# Patient Record
Sex: Female | Born: 1955
Health system: Southern US, Community
[De-identification: ages and names within clinical notes are randomized; demographics above are authoritative.]

## PROBLEM LIST (undated history)

## (undated) DIAGNOSIS — Z87891 Personal history of nicotine dependence: Secondary | ICD-10-CM

## (undated) DIAGNOSIS — J189 Pneumonia, unspecified organism: Secondary | ICD-10-CM

## (undated) DIAGNOSIS — I7121 Aneurysm of the ascending aorta, without rupture: Secondary | ICD-10-CM

## (undated) DIAGNOSIS — M419 Scoliosis, unspecified: Secondary | ICD-10-CM

## (undated) DIAGNOSIS — F329 Major depressive disorder, single episode, unspecified: Secondary | ICD-10-CM

## (undated) DIAGNOSIS — R011 Cardiac murmur, unspecified: Secondary | ICD-10-CM

## (undated) DIAGNOSIS — A0472 Enterocolitis due to Clostridium difficile, not specified as recurrent: Secondary | ICD-10-CM

## (undated) DIAGNOSIS — M539 Dorsopathy, unspecified: Secondary | ICD-10-CM

## (undated) DIAGNOSIS — I712 Thoracic aortic aneurysm, without rupture: Secondary | ICD-10-CM

## (undated) DIAGNOSIS — I10 Essential (primary) hypertension: Secondary | ICD-10-CM

## (undated) DIAGNOSIS — I350 Nonrheumatic aortic (valve) stenosis: Secondary | ICD-10-CM

## (undated) DIAGNOSIS — F32A Depression, unspecified: Secondary | ICD-10-CM

## (undated) DIAGNOSIS — K219 Gastro-esophageal reflux disease without esophagitis: Secondary | ICD-10-CM

## (undated) DIAGNOSIS — Q231 Congenital insufficiency of aortic valve: Secondary | ICD-10-CM

## (undated) DIAGNOSIS — Z9889 Other specified postprocedural states: Secondary | ICD-10-CM

## (undated) DIAGNOSIS — Q2381 Bicuspid aortic valve: Secondary | ICD-10-CM

## (undated) DIAGNOSIS — E876 Hypokalemia: Secondary | ICD-10-CM

## (undated) DIAGNOSIS — Z87898 Personal history of other specified conditions: Secondary | ICD-10-CM

## (undated) DIAGNOSIS — I480 Paroxysmal atrial fibrillation: Secondary | ICD-10-CM

## (undated) DIAGNOSIS — J90 Pleural effusion, not elsewhere classified: Secondary | ICD-10-CM

## (undated) DIAGNOSIS — K5792 Diverticulitis of intestine, part unspecified, without perforation or abscess without bleeding: Secondary | ICD-10-CM

## (undated) DIAGNOSIS — F419 Anxiety disorder, unspecified: Secondary | ICD-10-CM

## (undated) HISTORY — DX: Depression, unspecified: F32.A

## (undated) HISTORY — DX: Anxiety disorder, unspecified: F41.9

## (undated) HISTORY — DX: Gastro-esophageal reflux disease without esophagitis: K21.9

## (undated) HISTORY — DX: Congenital insufficiency of aortic valve: Q23.1

## (undated) HISTORY — DX: Nonrheumatic aortic (valve) stenosis: I35.0

## (undated) HISTORY — PX: BREAST BIOPSY: SHX20

## (undated) HISTORY — DX: Bicuspid aortic valve: Q23.81

## (undated) HISTORY — PX: BARTHOLIN GLAND CYST EXCISION: SHX565

## (undated) HISTORY — DX: Hypokalemia: E87.6

## (undated) HISTORY — DX: Personal history of nicotine dependence: Z87.891

## (undated) HISTORY — DX: Cardiac murmur, unspecified: R01.1

## (undated) HISTORY — DX: Thoracic aortic aneurysm, without rupture: I71.2

## (undated) HISTORY — DX: Other specified postprocedural states: Z98.890

## (undated) HISTORY — DX: Personal history of other specified conditions: Z87.898

## (undated) HISTORY — DX: Major depressive disorder, single episode, unspecified: F32.9

## (undated) HISTORY — DX: Diverticulitis of intestine, part unspecified, without perforation or abscess without bleeding: K57.92

## (undated) HISTORY — DX: Enterocolitis due to Clostridium difficile, not specified as recurrent: A04.72

## (undated) HISTORY — DX: Aneurysm of the ascending aorta, without rupture: I71.21

## (undated) HISTORY — DX: Essential (primary) hypertension: I10

## (undated) HISTORY — DX: Pleural effusion, not elsewhere classified: J90

## (undated) HISTORY — PX: SALPINGECTOMY: SHX328

## (undated) HISTORY — DX: Paroxysmal atrial fibrillation: I48.0

---

## 2007-12-16 ENCOUNTER — Emergency Department: Payer: Self-pay | Admitting: Emergency Medicine

## 2011-12-19 HISTORY — PX: COLONOSCOPY: SHX174

## 2012-10-11 ENCOUNTER — Ambulatory Visit: Payer: Self-pay | Admitting: Unknown Physician Specialty

## 2012-10-14 LAB — PATHOLOGY REPORT

## 2013-05-14 ENCOUNTER — Ambulatory Visit: Payer: Self-pay | Admitting: Family Medicine

## 2013-12-18 DIAGNOSIS — Z87898 Personal history of other specified conditions: Secondary | ICD-10-CM

## 2013-12-18 HISTORY — DX: Personal history of other specified conditions: Z87.898

## 2013-12-19 ENCOUNTER — Ambulatory Visit (INDEPENDENT_AMBULATORY_CARE_PROVIDER_SITE_OTHER): Payer: BC Managed Care – PPO | Admitting: Cardiovascular Disease

## 2013-12-19 ENCOUNTER — Encounter (INDEPENDENT_AMBULATORY_CARE_PROVIDER_SITE_OTHER): Payer: Self-pay

## 2013-12-19 ENCOUNTER — Encounter: Payer: Self-pay | Admitting: Cardiovascular Disease

## 2013-12-19 VITALS — BP 133/92 | HR 85 | Ht 65.0 in | Wt 128.5 lb

## 2013-12-19 DIAGNOSIS — K219 Gastro-esophageal reflux disease without esophagitis: Secondary | ICD-10-CM

## 2013-12-19 DIAGNOSIS — I10 Essential (primary) hypertension: Secondary | ICD-10-CM | POA: Insufficient documentation

## 2013-12-19 DIAGNOSIS — F411 Generalized anxiety disorder: Secondary | ICD-10-CM

## 2013-12-19 DIAGNOSIS — F419 Anxiety disorder, unspecified: Secondary | ICD-10-CM

## 2013-12-19 DIAGNOSIS — F32A Depression, unspecified: Secondary | ICD-10-CM | POA: Insufficient documentation

## 2013-12-19 DIAGNOSIS — R011 Cardiac murmur, unspecified: Secondary | ICD-10-CM

## 2013-12-19 DIAGNOSIS — Q231 Congenital insufficiency of aortic valve: Secondary | ICD-10-CM

## 2013-12-19 DIAGNOSIS — F329 Major depressive disorder, single episode, unspecified: Secondary | ICD-10-CM | POA: Insufficient documentation

## 2013-12-19 NOTE — Assessment & Plan Note (Signed)
She takes Klonopin when necessary

## 2013-12-19 NOTE — Patient Instructions (Addendum)
You are doing well. Consider starting aspirin 81 mg coated daily  We will schedule a echocardiogram for bicuspid aortic valve   Please call us if you have new issues that need to be addressed before your next appt.  Your physician wants you to follow-up in: 12 months.  You will receive a reminder letter in the mail two months in advance. If you don't receive a letter, please call our office to schedule the follow-up appointment.

## 2013-12-19 NOTE — Assessment & Plan Note (Signed)
Blood pressure is well controlled on today's visit. No changes made to the medications. 

## 2013-12-19 NOTE — Progress Notes (Signed)
   Patient ID: Deborah Koch, female    DOB: 08-05-1956, 58 y.o.   MRN: [id number]  HPI Comments: Deborah Koch is a very pleasant 58 year old woman with history of hypertension, GERD, previously diagnosed bicuspid aortic valve followed by cardiology in [facility] presents to establish care.   In general she reports that she is active, no complaints of shortness of breath, chest pain, lightheadedness dizziness, near syncope or syncope. She recently remarried, has 2 grown children. Now lives in [place]. She is unaware of the parameters of her valve on recent echocardiogram which was done several years ago. She knows she has a murmur and has had so for 20-30 years.   She reports that she used to smoke but stopped 10 years ago. Smoked since he was younger. She reports her cholesterol is very good though she does not know any specifics. No diabetes. Occasional anxiety and takes Klonopin when necessary  In general blood pressure has been well-controlled on her current medication regimen.  EKG shows normal sinus rhythm with rate 85 beats per minute, no significant ST or T wave changes   Outpatient Encounter Prescriptions as of 12/19/2013  Medication Sig  . clonazePAM (KLONOPIN) 0.5 MG tablet Take 0.5 mg by mouth as needed for anxiety.  [name] lisinopril-hydrochlorothiazide (PRINZIDE,ZESTORETIC) 20-25 MG per tablet Take 1 tablet by mouth daily.  . metoprolol succinate (TOPROL-XL) 25 MG 24 hr tablet Take 12.5 mg by mouth daily.  . nortriptyline (PAMELOR) 25 MG capsule Take 25 mg by mouth at bedtime.  [name] omeprazole (PRILOSEC) 20 MG capsule Take 20 mg by mouth daily.  [name] oxyCODONE-acetaminophen (PERCOCET/ROXICET) 5-325 MG per tablet Take by mouth as needed for severe pain.     Review of Systems  Constitutional: Negative.   HENT: Negative.   Eyes: Negative.   Respiratory: Negative.   Cardiovascular: Negative.   Gastrointestinal: Negative.   Endocrine: Negative.   Musculoskeletal:  Negative.   Skin: Negative.   Allergic/Immunologic: Negative.   Neurological: Negative.   Hematological: Negative.   Psychiatric/Behavioral: Negative.   All other systems reviewed and are negative.    BP 133/92  Pulse 85  Ht 5\' 5"  (1.651 m)  Wt 128 lb 8 oz (58.287 kg)  BMI 21.38 kg/m2  Physical Exam  Nursing note and vitals reviewed. Constitutional: She is oriented to person, place, and time. She appears well-developed and well-nourished.  HENT:  Head: Normocephalic.  Nose: Nose normal.  Mouth/Throat: Oropharynx is clear and moist.  Eyes: Conjunctivae are normal. Pupils are equal, round, and reactive to light.  Neck: Normal range of motion. Neck supple. No JVD present.  Cardiovascular: Normal rate, regular rhythm, S1 normal, S2 normal, normal heart sounds and intact distal pulses.  Exam reveals no gallop and no friction rub.   No murmur heard. Pulmonary/Chest: Effort normal and breath sounds normal. No respiratory distress. She has no wheezes. She has no rales. She exhibits no tenderness.  Abdominal: Soft. Bowel sounds are normal. She exhibits no distension. There is no tenderness.  Musculoskeletal: Normal range of motion. She exhibits no edema and no tenderness.  Lymphadenopathy:    She has no cervical adenopathy.  Neurological: She is alert and oriented to person, place, and time. Coordination normal.  Skin: Skin is warm and dry. No rash noted. No erythema.  Psychiatric: She has a normal mood and affect. Her behavior is normal. Judgment and thought content normal.    Assessment and Plan

## 2013-12-19 NOTE — Assessment & Plan Note (Addendum)
Long history of murmur from underlying bicuspid aortic valve per the patient. Records have been requested. Echo pending.

## 2013-12-19 NOTE — Assessment & Plan Note (Addendum)
We spent some time today talking about the pathology of her bicuspid aortic valve. We have requested records from her prior cardiologist and these will be reviewed when available. We will schedule a echocardiogram as it has been several years since her prior ultrasound. We did discuss various options for treatment if she develops severe aortic valve regurgitation or stenosis. Also talked about rare possible complications including ascending aorta aneurysm. We will call her with the results and will likely schedule a followup visit once a year. We did discuss symptoms to watch for including shortness of breath with exertion, chest pain with exertion, near syncope.

## 2013-12-19 NOTE — Assessment & Plan Note (Signed)
She is taking omeprazole daily with no significant GERD symptoms

## 2013-12-30 ENCOUNTER — Ambulatory Visit: Payer: Self-pay | Admitting: Family Medicine

## 2014-01-01 ENCOUNTER — Other Ambulatory Visit: Payer: BC Managed Care – PPO

## 2014-01-06 ENCOUNTER — Other Ambulatory Visit (INDEPENDENT_AMBULATORY_CARE_PROVIDER_SITE_OTHER): Payer: BC Managed Care – PPO

## 2014-01-06 ENCOUNTER — Other Ambulatory Visit: Payer: Self-pay

## 2014-01-06 DIAGNOSIS — R011 Cardiac murmur, unspecified: Secondary | ICD-10-CM

## 2014-01-06 DIAGNOSIS — Q231 Congenital insufficiency of aortic valve: Secondary | ICD-10-CM

## 2014-01-06 DIAGNOSIS — I359 Nonrheumatic aortic valve disorder, unspecified: Secondary | ICD-10-CM

## 2014-06-26 ENCOUNTER — Ambulatory Visit: Payer: Self-pay | Admitting: Family Medicine

## 2014-07-16 ENCOUNTER — Ambulatory Visit: Payer: Self-pay | Admitting: Unknown Physician Specialty

## 2014-07-18 ENCOUNTER — Inpatient Hospital Stay: Payer: Self-pay | Admitting: Internal Medicine

## 2014-07-18 LAB — URINALYSIS, COMPLETE
Bacteria: NONE SEEN
Bilirubin,UR: NEGATIVE
Blood: NEGATIVE
Glucose,UR: NEGATIVE mg/dL (ref 0–75)
Ketone: NEGATIVE
Leukocyte Esterase: NEGATIVE
Nitrite: NEGATIVE
Ph: 6 (ref 4.5–8.0)
Protein: NEGATIVE
RBC,UR: <1 /HPF (ref 0–5)
Specific Gravity: 1.002 (ref 1.003–1.030)
Squamous Epithelial: 3
WBC UR: <1 /HPF (ref 0–5)

## 2014-07-18 LAB — CBC WITH DIFFERENTIAL/PLATELET
Basophil #: 0.1 10*3/uL (ref 0.0–0.1)
Basophil %: 0.6 %
Eosinophil #: 0.4 10*3/uL (ref 0.0–0.7)
Eosinophil %: 2.8 %
HCT: 37.6 % (ref 35.0–47.0)
HGB: 12.7 g/dL (ref 12.0–16.0)
Lymphocyte #: 1.6 10*3/uL (ref 1.0–3.6)
Lymphocyte %: 13.3 %
MCH: 31.9 pg (ref 26.0–34.0)
MCHC: 33.8 g/dL (ref 32.0–36.0)
MCV: 94 fL (ref 80–100)
Monocyte #: 0.9 x10 3/mm (ref 0.2–0.9)
Monocyte %: 7.2 %
Neutrophil #: 9.4 10*3/uL — ABNORMAL HIGH (ref 1.4–6.5)
Neutrophil %: 76.1 %
Platelet: 364 10*3/uL (ref 150–440)
RBC: 3.98 10*6/uL (ref 3.80–5.20)
RDW: 13.3 % (ref 11.5–14.5)
WBC: 12.4 10*3/uL — ABNORMAL HIGH (ref 3.6–11.0)

## 2014-07-18 LAB — COMPREHENSIVE METABOLIC PANEL
Albumin: 3.4 g/dL (ref 3.4–5.0)
Alkaline Phosphatase: 90 U/L
Anion Gap: 8 (ref 7–16)
BUN: 4 mg/dL — ABNORMAL LOW (ref 7–18)
Bilirubin,Total: 0.5 mg/dL (ref 0.2–1.0)
Calcium, Total: 8.6 mg/dL (ref 8.5–10.1)
Chloride: 95 mmol/L — ABNORMAL LOW (ref 98–107)
Co2: 26 mmol/L (ref 21–32)
Creatinine: 0.6 mg/dL (ref 0.60–1.30)
EGFR (African American): >60
EGFR (Non-African Amer.): >60
Glucose: 92 mg/dL (ref 65–99)
Osmolality: 255 (ref 275–301)
Potassium: 4 mmol/L (ref 3.5–5.1)
SGOT(AST): 35 U/L (ref 15–37)
SGPT (ALT): 22 U/L
Sodium: 129 mmol/L — ABNORMAL LOW (ref 136–145)
Total Protein: 7.6 g/dL (ref 6.4–8.2)

## 2014-07-18 LAB — LIPASE, BLOOD: Lipase: 76 U/L (ref 73–393)

## 2014-07-19 LAB — COMPREHENSIVE METABOLIC PANEL
Albumin: 3.1 g/dL — ABNORMAL LOW (ref 3.4–5.0)
Alkaline Phosphatase: 78 U/L
Anion Gap: 5 — ABNORMAL LOW (ref 7–16)
BUN: 3 mg/dL — ABNORMAL LOW (ref 7–18)
Bilirubin,Total: 0.4 mg/dL (ref 0.2–1.0)
Calcium, Total: 8.4 mg/dL — ABNORMAL LOW (ref 8.5–10.1)
Chloride: 97 mmol/L — ABNORMAL LOW (ref 98–107)
Co2: 29 mmol/L (ref 21–32)
Creatinine: 0.81 mg/dL (ref 0.60–1.30)
EGFR (African American): >60
EGFR (Non-African Amer.): >60
Glucose: 92 mg/dL (ref 65–99)
Osmolality: 259 (ref 275–301)
Potassium: 3.5 mmol/L (ref 3.5–5.1)
SGOT(AST): 20 U/L (ref 15–37)
SGPT (ALT): 20 U/L
Sodium: 131 mmol/L — ABNORMAL LOW (ref 136–145)
Total Protein: 6.7 g/dL (ref 6.4–8.2)

## 2014-07-19 LAB — CBC WITH DIFFERENTIAL/PLATELET
Basophil #: 0.1 10*3/uL (ref 0.0–0.1)
Basophil %: 0.4 %
Eosinophil #: 0.2 10*3/uL (ref 0.0–0.7)
Eosinophil %: 1.3 %
HCT: 33.6 % — ABNORMAL LOW (ref 35.0–47.0)
HGB: 11.6 g/dL — ABNORMAL LOW (ref 12.0–16.0)
Lymphocyte #: 0.8 10*3/uL — ABNORMAL LOW (ref 1.0–3.6)
Lymphocyte %: 5.5 %
MCH: 32.2 pg (ref 26.0–34.0)
MCHC: 34.6 g/dL (ref 32.0–36.0)
MCV: 93 fL (ref 80–100)
Monocyte #: 1 x10 3/mm — ABNORMAL HIGH (ref 0.2–0.9)
Monocyte %: 6.8 %
Neutrophil #: 12.5 10*3/uL — ABNORMAL HIGH (ref 1.4–6.5)
Neutrophil %: 86 %
Platelet: 333 10*3/uL (ref 150–440)
RBC: 3.61 10*6/uL — ABNORMAL LOW (ref 3.80–5.20)
RDW: 13.1 % (ref 11.5–14.5)
WBC: 14.5 10*3/uL — ABNORMAL HIGH (ref 3.6–11.0)

## 2014-07-19 LAB — CLOSTRIDIUM DIFFICILE(ARMC)

## 2014-07-20 LAB — BASIC METABOLIC PANEL
Anion Gap: 3 — ABNORMAL LOW (ref 7–16)
BUN: 3 mg/dL — ABNORMAL LOW (ref 7–18)
Calcium, Total: 7.9 mg/dL — ABNORMAL LOW (ref 8.5–10.1)
Chloride: 99 mmol/L (ref 98–107)
Co2: 28 mmol/L (ref 21–32)
Creatinine: 0.77 mg/dL (ref 0.60–1.30)
EGFR (African American): >60
EGFR (Non-African Amer.): >60
Glucose: 92 mg/dL (ref 65–99)
Osmolality: 257 (ref 275–301)
Potassium: 3.5 mmol/L (ref 3.5–5.1)
Sodium: 130 mmol/L — ABNORMAL LOW (ref 136–145)

## 2014-07-20 LAB — CBC WITH DIFFERENTIAL/PLATELET
Basophil #: 0 10*3/uL (ref 0.0–0.1)
Basophil %: 0.2 %
Eosinophil #: 0.2 10*3/uL (ref 0.0–0.7)
Eosinophil %: 1.4 %
HCT: 30.5 % — ABNORMAL LOW (ref 35.0–47.0)
HGB: 10.5 g/dL — ABNORMAL LOW (ref 12.0–16.0)
Lymphocyte #: 1.4 10*3/uL (ref 1.0–3.6)
Lymphocyte %: 9.8 %
MCH: 32.2 pg (ref 26.0–34.0)
MCHC: 34.5 g/dL (ref 32.0–36.0)
MCV: 93 fL (ref 80–100)
Monocyte #: 1.4 x10 3/mm — ABNORMAL HIGH (ref 0.2–0.9)
Monocyte %: 10.3 %
Neutrophil #: 10.9 10*3/uL — ABNORMAL HIGH (ref 1.4–6.5)
Neutrophil %: 78.3 %
Platelet: 293 10*3/uL (ref 150–440)
RBC: 3.28 10*6/uL — ABNORMAL LOW (ref 3.80–5.20)
RDW: 12.9 % (ref 11.5–14.5)
WBC: 13.9 10*3/uL — ABNORMAL HIGH (ref 3.6–11.0)

## 2014-07-20 LAB — WBCS, STOOL

## 2014-07-21 LAB — BASIC METABOLIC PANEL
Anion Gap: 8 (ref 7–16)
BUN: 2 mg/dL — ABNORMAL LOW (ref 7–18)
Calcium, Total: 7.8 mg/dL — ABNORMAL LOW (ref 8.5–10.1)
Chloride: 97 mmol/L — ABNORMAL LOW (ref 98–107)
Co2: 28 mmol/L (ref 21–32)
Creatinine: 0.6 mg/dL (ref 0.60–1.30)
EGFR (African American): >60
EGFR (Non-African Amer.): >60
Glucose: 111 mg/dL — ABNORMAL HIGH (ref 65–99)
Osmolality: 263 (ref 275–301)
Potassium: 2.6 mmol/L — ABNORMAL LOW (ref 3.5–5.1)
Sodium: 133 mmol/L — ABNORMAL LOW (ref 136–145)

## 2014-07-21 LAB — CBC WITH DIFFERENTIAL/PLATELET
Basophil #: 0 10*3/uL (ref 0.0–0.1)
Basophil %: 0.4 %
Eosinophil #: 0.6 10*3/uL (ref 0.0–0.7)
Eosinophil %: 4.6 %
HCT: 28.5 % — ABNORMAL LOW (ref 35.0–47.0)
HGB: 9.9 g/dL — ABNORMAL LOW (ref 12.0–16.0)
Lymphocyte #: 1.4 10*3/uL (ref 1.0–3.6)
Lymphocyte %: 11.3 %
MCH: 32.3 pg (ref 26.0–34.0)
MCHC: 34.9 g/dL (ref 32.0–36.0)
MCV: 93 fL (ref 80–100)
Monocyte #: 0.9 x10 3/mm (ref 0.2–0.9)
Monocyte %: 6.9 %
Neutrophil #: 9.8 10*3/uL — ABNORMAL HIGH (ref 1.4–6.5)
Neutrophil %: 76.8 %
Platelet: 276 10*3/uL (ref 150–440)
RBC: 3.07 10*6/uL — ABNORMAL LOW (ref 3.80–5.20)
RDW: 12.9 % (ref 11.5–14.5)
WBC: 12.8 10*3/uL — ABNORMAL HIGH (ref 3.6–11.0)

## 2014-07-21 LAB — STOOL CULTURE

## 2014-07-22 LAB — BASIC METABOLIC PANEL
Anion Gap: 6 — ABNORMAL LOW (ref 7–16)
BUN: <1 mg/dL — ABNORMAL LOW (ref 7–18)
Calcium, Total: 7.8 mg/dL — ABNORMAL LOW (ref 8.5–10.1)
Chloride: 104 mmol/L (ref 98–107)
Co2: 24 mmol/L (ref 21–32)
Creatinine: 0.63 mg/dL (ref 0.60–1.30)
EGFR (African American): >60
EGFR (Non-African Amer.): >60
Glucose: 85 mg/dL (ref 65–99)
Potassium: 4.9 mmol/L (ref 3.5–5.1)
Sodium: 134 mmol/L — ABNORMAL LOW (ref 136–145)

## 2014-07-22 LAB — CBC WITH DIFFERENTIAL/PLATELET
Basophil #: 0.1 10*3/uL (ref 0.0–0.1)
Basophil %: 0.6 %
Eosinophil #: 0.6 10*3/uL (ref 0.0–0.7)
Eosinophil %: 6 %
HCT: 28.8 % — ABNORMAL LOW (ref 35.0–47.0)
HGB: 10.2 g/dL — ABNORMAL LOW (ref 12.0–16.0)
Lymphocyte #: 1.7 10*3/uL (ref 1.0–3.6)
Lymphocyte %: 18.5 %
MCH: 32.8 pg (ref 26.0–34.0)
MCHC: 35.4 g/dL (ref 32.0–36.0)
MCV: 93 fL (ref 80–100)
Monocyte #: 0.7 x10 3/mm (ref 0.2–0.9)
Monocyte %: 7.4 %
Neutrophil #: 6.3 10*3/uL (ref 1.4–6.5)
Neutrophil %: 67.5 %
Platelet: 312 10*3/uL (ref 150–440)
RBC: 3.11 10*6/uL — ABNORMAL LOW (ref 3.80–5.20)
RDW: 13 % (ref 11.5–14.5)
WBC: 9.3 10*3/uL (ref 3.6–11.0)

## 2014-07-23 LAB — BASIC METABOLIC PANEL
Anion Gap: 8 (ref 7–16)
BUN: <1 mg/dL — ABNORMAL LOW (ref 7–18)
Calcium, Total: 8.7 mg/dL (ref 8.5–10.1)
Chloride: 103 mmol/L (ref 98–107)
Co2: 27 mmol/L (ref 21–32)
Creatinine: 0.62 mg/dL (ref 0.60–1.30)
EGFR (African American): >60
EGFR (Non-African Amer.): >60
Glucose: 91 mg/dL (ref 65–99)
Potassium: 3.8 mmol/L (ref 3.5–5.1)
Sodium: 138 mmol/L (ref 136–145)

## 2014-07-23 LAB — PRO B NATRIURETIC PEPTIDE: B-Type Natriuretic Peptide: 1608 pg/mL — ABNORMAL HIGH (ref 0–125)

## 2014-07-24 LAB — CBC WITH DIFFERENTIAL/PLATELET
Basophil #: 0.1 10*3/uL (ref 0.0–0.1)
Basophil %: 1 %
Eosinophil #: 0.4 10*3/uL (ref 0.0–0.7)
Eosinophil %: 5.9 %
HCT: 29.9 % — ABNORMAL LOW (ref 35.0–47.0)
HGB: 10.5 g/dL — ABNORMAL LOW (ref 12.0–16.0)
Lymphocyte #: 1.6 10*3/uL (ref 1.0–3.6)
Lymphocyte %: 23.6 %
MCH: 32.2 pg (ref 26.0–34.0)
MCHC: 35 g/dL (ref 32.0–36.0)
MCV: 92 fL (ref 80–100)
Monocyte #: 0.8 x10 3/mm (ref 0.2–0.9)
Monocyte %: 11.7 %
Neutrophil #: 3.9 10*3/uL (ref 1.4–6.5)
Neutrophil %: 57.8 %
Platelet: 376 10*3/uL (ref 150–440)
RBC: 3.26 10*6/uL — ABNORMAL LOW (ref 3.80–5.20)
RDW: 13.5 % (ref 11.5–14.5)
WBC: 6.8 10*3/uL (ref 3.6–11.0)

## 2014-07-24 LAB — BASIC METABOLIC PANEL
Anion Gap: 9 (ref 7–16)
BUN: 2 mg/dL — ABNORMAL LOW (ref 7–18)
Calcium, Total: 8.8 mg/dL (ref 8.5–10.1)
Chloride: 100 mmol/L (ref 98–107)
Co2: 29 mmol/L (ref 21–32)
Creatinine: 0.85 mg/dL (ref 0.60–1.30)
EGFR (African American): >60
EGFR (Non-African Amer.): >60
Glucose: 92 mg/dL (ref 65–99)
Osmolality: 272 (ref 275–301)
Potassium: 3.1 mmol/L — ABNORMAL LOW (ref 3.5–5.1)
Sodium: 138 mmol/L (ref 136–145)

## 2014-07-28 ENCOUNTER — Telehealth: Payer: Self-pay

## 2014-07-28 NOTE — Telephone Encounter (Signed)
Patient contacted regarding discharge from [facility] on 07/24/14.  Patient understands to follow up with  on [name], NP at 1:15 at [facility]. Patient understands discharge instructions? yes Patient understands medications and regiment? yes Patient understands to bring all medications to this visit? yes

## 2014-08-11 ENCOUNTER — Encounter (INDEPENDENT_AMBULATORY_CARE_PROVIDER_SITE_OTHER): Payer: Self-pay

## 2014-08-11 ENCOUNTER — Other Ambulatory Visit: Payer: Self-pay | Admitting: Nurse Practitioner

## 2014-08-11 ENCOUNTER — Encounter: Payer: Self-pay | Admitting: Nurse Practitioner

## 2014-08-11 ENCOUNTER — Ambulatory Visit (INDEPENDENT_AMBULATORY_CARE_PROVIDER_SITE_OTHER): Payer: BC Managed Care – PPO | Admitting: Nurse Practitioner

## 2014-08-11 VITALS — BP 120/80 | HR 87 | Ht 63.0 in | Wt 124.8 lb

## 2014-08-11 DIAGNOSIS — Q231 Congenital insufficiency of aortic valve: Secondary | ICD-10-CM

## 2014-08-11 DIAGNOSIS — R5383 Other fatigue: Secondary | ICD-10-CM

## 2014-08-11 DIAGNOSIS — R011 Cardiac murmur, unspecified: Secondary | ICD-10-CM

## 2014-08-11 DIAGNOSIS — I350 Nonrheumatic aortic (valve) stenosis: Secondary | ICD-10-CM

## 2014-08-11 DIAGNOSIS — I359 Nonrheumatic aortic valve disorder, unspecified: Secondary | ICD-10-CM

## 2014-08-11 DIAGNOSIS — I7121 Aneurysm of the ascending aorta, without rupture: Secondary | ICD-10-CM

## 2014-08-11 DIAGNOSIS — I10 Essential (primary) hypertension: Secondary | ICD-10-CM

## 2014-08-11 DIAGNOSIS — A0472 Enterocolitis due to Clostridium difficile, not specified as recurrent: Secondary | ICD-10-CM | POA: Insufficient documentation

## 2014-08-11 DIAGNOSIS — Q2381 Bicuspid aortic valve: Secondary | ICD-10-CM | POA: Insufficient documentation

## 2014-08-11 DIAGNOSIS — I712 Thoracic aortic aneurysm, without rupture, unspecified: Secondary | ICD-10-CM

## 2014-08-11 DIAGNOSIS — R5381 Other malaise: Secondary | ICD-10-CM

## 2014-08-11 DIAGNOSIS — Q249 Congenital malformation of heart, unspecified: Secondary | ICD-10-CM

## 2014-08-11 DIAGNOSIS — I77810 Thoracic aortic ectasia: Secondary | ICD-10-CM | POA: Insufficient documentation

## 2014-08-11 NOTE — Progress Notes (Addendum)
Patient Name: Deborah Koch Date of Encounter: 08/11/2014  Primary Care Provider:  Dear, Trude Mcburney, MD Primary Cardiologist:  Johnny Bridge, MD   Patient Profile  58 y/o female with a h/o bicuspid Ao valve and mod-severe Ao stenosis who presents today for f/u after recent hospitalization 2/2 C diff colitis.  Problem List   Past Medical History  Diagnosis Date  . Moderate to severe aortic stenosis     a. 12/2013 Echo: EF 60-65%, Grade 1 DD, bicuspid AoV with mod-sev AS [83mmHg mean gradient, AoV area 0.69cm^2 (VTI)], mod dil ascending Ao - 4.45cm, mild TR, PASP 102mmHg.  . Congenital bicuspid aortic valve   . Dilated Ascending Aorta     a. 12/2013 Echo: 4.45cm.  [name] HTN (hypertension)   . History of tobacco abuse     a. 20 yrs, 1/4 ppd - quit.  [name] GERD (gastroesophageal reflux disease)   . Depression   . Diverticulitis   . C. difficile colitis     a. 07/2014.  [name] Hypokalemia    Past Surgical History  Procedure Laterality Date  . Bartholin gland cyst excision    . Breast biopsy Left     Allergies  No Known Allergies  HPI  58 y/o female with the above problem list.  She was last seen here in January 2/2 h/o bicuspid Ao valve and AS.  Echo @ that time showed mod-sev AS and mod dilated Ao root.  Recommendation was made for f/u echo in Jan. 2016.  She was in her usoh until early July, when she began to experience abd discomfort and diarrhea. She was placed on cipro and flagyl for presumed diverticulitis.  She initially recovered but then developed recurrent Ss and was placed on augmentin.  Following this, Ss only worsened and she was admitted to [facility] for further eval.  She was Dx with C diff and placed on IV abx and also aggressively hydrated. She remained in the hospital for 1 week.  In the setting of aggressive hydration, she developed volume overload and required IV diuresis.  An echo was performed prior to discharge but at this time, that report is not available.  Since  discharge, she says that she fatigues easily but denies chest pain, sob, pnd, orthopnea, n, v, dizziness, syncope, or edema.  She finished her abx last Tuesday and has not had any diarrhea.  She has been seen by GI since d/c with a plan for repeat colonoscopy next year.  Home Medications  Prior to Admission medications   Medication Sig Start Date End Date Taking? Authorizing Provider  clonazePAM (KLONOPIN) 1 MG tablet Take 1 mg by mouth 2 (two) times daily.   Yes Historical Provider, MD  lisinopril-hydrochlorothiazide (PRINZIDE,ZESTORETIC) 20-25 MG per tablet Take 1 tablet by mouth daily.   Yes Historical Provider, MD  metoprolol succinate (TOPROL-XL) 25 MG 24 hr tablet Take 12.5 mg by mouth daily.   Yes Historical Provider, MD  nortriptyline (PAMELOR) 25 MG capsule Take 25 mg by mouth at bedtime.   Yes Historical Provider, MD  omeprazole (PRILOSEC) 20 MG capsule Take 20 mg by mouth daily.   Yes Historical Provider, MD  oxyCODONE-acetaminophen (PERCOCET/ROXICET) 5-325 MG per tablet Take by mouth as needed for severe pain.   Yes Historical Provider, MD  potassium chloride SA (K-DUR,KLOR-CON) 20 MEQ tablet Take 20 mEq by mouth daily.  07/24/14  Yes Historical Provider, MD    Review of Systems  She has been somewhat fatigued since hospitalization but is  otherwise doing well.  She denies chest pain, palpitations, dyspnea, pnd, orthopnea, n, v, diarrhea, dizziness, syncope, edema, weight gain, or early satiety.  All other systems reviewed and are otherwise negative except as noted above.  Physical Exam  Blood pressure 120/80, pulse 87, height 5\' 3"  (1.6 m), weight 124 lb 12 oz (56.586 kg).  General: Pleasant, NAD Psych: Normal affect. Neuro: Alert and oriented X 3. Moves all extremities spontaneously. HEENT: Normal  Neck: Supple without JVD.  Radiated AS murmur noted bilaterally. Lungs:  Resp regular and unlabored, CTA. Heart: RRR no s3, s4, 3/6 SEM loudest @ the RUSB but heard  throughout. Abdomen: Soft, non-tender, non-distended, BS + x 4.  Extremities: No clubbing, cyanosis or edema. DP/PT/Radials 2+ and equal bilaterally.  Accessory Clinical Findings  ECG - RSR, 87, no acute st/t changes.  Assessment & Plan  1.  C diff Colitis:  Resolved.  Now off of abx and w/o further diarrhea.  She has f/u with GI since discharge.  2.  Mod-Severe Aortic Stenosis/Congenital Bicuspid Ao Valve:  While hospitalized, she developed dyspnea and volume overload in the setting of aggressive IV hydration. This resolved during hospitalization.  Echo was done prior to discharge however a report is not currently available.  We have called over to [facility] and are working to get that echo read/reported out. Provided that AS is stable and LV fxn remains nl, would plan to f/u echo in 1 yr.  Cont BB.  3.  HTN:  Stable.  4.  Moderately dilated Ascending Aorta:  4.5 cm by echo 12/2013.  As above repeat echo performed @ [facility]  - read pending.  5.  Disposition:  F/U echo @ [place].  F/U with Dr. Rockey Situ in January as previously planned, or sooner if necessary.   Murray Hodgkins, NP 08/11/2014, 1:55 PM  ADDENDUM  I spoke with the lead echo tech @ [facility] today.  Ms. Alessandrini did have an echo done while hospitalized however the study could not be transmitted to the server due to a hardware issue.  The machine that was used for her study was sent off for repair and required a wiping out of the hard drive as part of the repair.  Thus her study was never uploaded to the server and was subsequently lost in machine maintenance.  Our office has contacted the CV service administrator @ [facility] and the patient was never billed for the study.  I contacted the patient and explained the situation.  We will arrange for a 2D echo to be done in the office at the patient's convenience.  Pt verbalized understanding and was grateful for our follow-up of this situation.

## 2014-08-11 NOTE — Patient Instructions (Signed)
Your physician recommends that you continue on your current medications as directed. Please refer to the Current Medication list given to you today.  Please follow up with Dr. Rockey Situ in January 2016

## 2014-08-19 ENCOUNTER — Telehealth: Payer: Self-pay

## 2014-08-19 NOTE — Telephone Encounter (Signed)
Request from [place] , sent to [place] on 08/20/2014 . Sent release information to pt

## 2014-08-25 ENCOUNTER — Other Ambulatory Visit: Payer: Self-pay

## 2014-08-25 ENCOUNTER — Other Ambulatory Visit (INDEPENDENT_AMBULATORY_CARE_PROVIDER_SITE_OTHER): Payer: BC Managed Care – PPO

## 2014-08-25 DIAGNOSIS — Q231 Congenital insufficiency of aortic valve: Secondary | ICD-10-CM

## 2014-08-25 DIAGNOSIS — I350 Nonrheumatic aortic (valve) stenosis: Secondary | ICD-10-CM

## 2014-08-25 DIAGNOSIS — I359 Nonrheumatic aortic valve disorder, unspecified: Secondary | ICD-10-CM

## 2014-08-25 DIAGNOSIS — I712 Thoracic aortic aneurysm, without rupture, unspecified: Secondary | ICD-10-CM

## 2014-08-25 DIAGNOSIS — R011 Cardiac murmur, unspecified: Secondary | ICD-10-CM

## 2014-08-28 ENCOUNTER — Telehealth: Payer: Self-pay | Admitting: *Deleted

## 2014-08-28 DIAGNOSIS — I350 Nonrheumatic aortic (valve) stenosis: Secondary | ICD-10-CM

## 2014-08-28 DIAGNOSIS — I7121 Aneurysm of the ascending aorta, without rupture: Secondary | ICD-10-CM

## 2014-08-28 DIAGNOSIS — I712 Thoracic aortic aneurysm, without rupture: Secondary | ICD-10-CM

## 2014-08-28 NOTE — Telephone Encounter (Signed)
Message copied by Tracie Harrier on Fri Aug 28, 2014 10:12 AM ------      Message from: Murray Hodgkins R      Created: Thu Aug 27, 2014 10:49 AM       Stable, moderate to severe AS.  Stable aortic root dilation.  F/u echo 1 yr. ------

## 2014-09-01 ENCOUNTER — Telehealth: Payer: Self-pay | Admitting: *Deleted

## 2014-09-01 NOTE — Telephone Encounter (Signed)
Request from [place] , sent to [place] on 09/01/14 .

## 2014-12-18 HISTORY — PX: OOPHORECTOMY: SHX86

## 2014-12-18 HISTORY — PX: OVARY SURGERY: SHX727

## 2015-02-19 ENCOUNTER — Telehealth: Payer: Self-pay | Admitting: *Deleted

## 2015-02-19 NOTE — Telephone Encounter (Signed)
Lmom to call our office and schedule a Echo (1 yr f/u).

## 2015-02-25 ENCOUNTER — Ambulatory Visit (INDEPENDENT_AMBULATORY_CARE_PROVIDER_SITE_OTHER): Payer: BLUE CROSS/BLUE SHIELD | Admitting: Cardiovascular Disease

## 2015-02-25 ENCOUNTER — Encounter: Payer: Self-pay | Admitting: Cardiovascular Disease

## 2015-02-25 VITALS — BP 116/80 | HR 67 | Ht 63.0 in | Wt 132.0 lb

## 2015-02-25 DIAGNOSIS — Q239 Congenital malformation of aortic and mitral valves, unspecified: Secondary | ICD-10-CM

## 2015-02-25 DIAGNOSIS — I712 Thoracic aortic aneurysm, without rupture: Secondary | ICD-10-CM

## 2015-02-25 DIAGNOSIS — I10 Essential (primary) hypertension: Secondary | ICD-10-CM

## 2015-02-25 DIAGNOSIS — Q231 Congenital insufficiency of aortic valve: Secondary | ICD-10-CM

## 2015-02-25 DIAGNOSIS — A047 Enterocolitis due to Clostridium difficile: Secondary | ICD-10-CM

## 2015-02-25 DIAGNOSIS — I7121 Aneurysm of the ascending aorta, without rupture: Secondary | ICD-10-CM

## 2015-02-25 DIAGNOSIS — I77810 Thoracic aortic ectasia: Secondary | ICD-10-CM

## 2015-02-25 DIAGNOSIS — A0472 Enterocolitis due to Clostridium difficile, not specified as recurrent: Secondary | ICD-10-CM

## 2015-02-25 NOTE — Assessment & Plan Note (Signed)
By her count, symptoms have resolved. Currently stable

## 2015-02-25 NOTE — Assessment & Plan Note (Signed)
Stable on recent echocardiogram September 2015, 4.1 cm Repeat echocardiogram in 2017

## 2015-02-25 NOTE — Patient Instructions (Signed)
You are doing well. No medication changes were made.  We will schedule an echocardiogram for bicuspid aortic valve and stenosis for 2017  Please call us if you have new issues that need to be addressed before your next appt.  Your physician wants you to follow-up in: 12 months.  You will receive a reminder letter in the mail two months in advance. If you don't receive a letter, please call our office to schedule the follow-up appointment.

## 2015-02-25 NOTE — Assessment & Plan Note (Signed)
Blood pressure is well controlled on today's visit. No changes made to the medications. 

## 2015-02-25 NOTE — Assessment & Plan Note (Signed)
Bicuspid aortic valve with moderate aortic valve stenosis by parameters. 2 echocardiograms in 2015, essentially unchanged. We have suggested repeat echocardiogram in 2017. She is asymptomatic parameters discussed with her in detail.

## 2015-02-25 NOTE — Progress Notes (Signed)
Patient ID: Deborah Koch, female    DOB: Nov 21, 1956, 59 y.o.   MRN: [id number]  HPI Comments: Ms Heatley is a very pleasant 59 year old woman with history of bicuspid aortic valve, moderate aortic valve stenosis , hypertension, GERD, who presents for routine follow-up of her aortic valve disease.  Echocardiogram January 2015 and repeat echocardiogram September 2015 essentially unchanged She has 4.1 cm ascending aorta dilation, with moderate aortic valve stenosis, peak velocity 283 cm/s, mean gradient 27 mmHg, peak gradient 44 mmHg .   We spent much of her visit talking about her GI illness August 2015, she reports having diverticulitis, C. difficile requiring long course of antibiotics in recovery   currently feels back to normal Denies any shortness of breath or chest pain with exertion concerning for progression of her aortic valve stenosis. In general blood pressure has been well-controlled on her current medication regimen.  EKG on today's visit shows normal sinus rhythm with rate 67 bpm, no significant ST or T-wave changes   other past medical history  She reports that she used to smoke but stopped 10 years ago. Smoked since he was younger. She reports her cholesterol is very good though she does not know any specifics. No diabetes. Occasional anxiety and takes Klonopin when necessary   No Known Allergies  Outpatient Encounter Prescriptions as of 02/25/2015  Medication Sig  . clonazePAM (KLONOPIN) 1 MG tablet Take 1 mg by mouth 2 (two) times daily.  [name] lisinopril-hydrochlorothiazide (PRINZIDE,ZESTORETIC) 20-25 MG per tablet Take 1 tablet by mouth daily.  . metoprolol succinate (TOPROL-XL) 25 MG 24 hr tablet Take 12.5 mg by mouth daily.  [name] omeprazole (PRILOSEC) 20 MG capsule Take 20 mg by mouth daily.  . [DISCONTINUED] nortriptyline (PAMELOR) 25 MG capsule Take 25 mg by mouth at bedtime.  . [DISCONTINUED] oxyCODONE-acetaminophen (PERCOCET/ROXICET) 5-325 MG per tablet Take by mouth  as needed for severe pain.  . [DISCONTINUED] potassium chloride SA (K-DUR,KLOR-CON) 20 MEQ tablet Take 20 mEq by mouth daily.     Past Medical History  Diagnosis Date  . Moderate to severe aortic stenosis     a. 12/2013 Echo: EF 60-65%, Grade 1 DD, bicuspid AoV with mod-sev AS [11mmHg mean gradient, AoV area 0.69cm^2 (VTI)], mod dil ascending Ao - 4.45cm, mild TR, PASP 35mmHg.  . Congenital bicuspid aortic valve   . Dilated Ascending Aorta     a. 12/2013 Echo: 4.45cm.  [name] HTN (hypertension)   . History of tobacco abuse     a. 20 yrs, 1/4 ppd - quit.  [name] GERD (gastroesophageal reflux disease)   . Depression   . Diverticulitis   . C. difficile colitis     a. 07/2014.  [name] Hypokalemia     Past Surgical History  Procedure Laterality Date  . Bartholin gland cyst excision    . Breast biopsy Left     Social History  reports that she has quit smoking. Her smoking use included Cigarettes. She has a 5 pack-year smoking history. She does not have any smokeless tobacco history on file. She reports that she drinks alcohol. She reports that she does not use illicit drugs.  Family History family history includes Heart failure in her father; Hypertension in her father and mother. There is no history of Heart attack or Stroke.   Review of Systems  Constitutional: Negative.   Respiratory: Negative.   Cardiovascular: Negative.   Gastrointestinal: Negative.   Musculoskeletal: Negative.   Skin: Negative.   Neurological: Negative.   Hematological: Negative.  Psychiatric/Behavioral: Negative.   All other systems reviewed and are negative.   BP 116/80 mmHg  Pulse 67  Ht 5\' 3"  (1.6 m)  Wt 132 lb (59.875 kg)  BMI 23.39 kg/m2  Physical Exam  Constitutional: She is oriented to person, place, and time. She appears well-developed and well-nourished.  HENT:  Head: Normocephalic.  Nose: Nose normal.  Mouth/Throat: Oropharynx is clear and moist.  Eyes: Conjunctivae are normal. Pupils are equal,  round, and reactive to light.  Neck: Normal range of motion. Neck supple. No JVD present.  Cardiovascular: Normal rate, regular rhythm, S1 normal, S2 normal, normal heart sounds and intact distal pulses.  Exam reveals no gallop and no friction rub.   No murmur heard. Pulmonary/Chest: Effort normal and breath sounds normal. No respiratory distress. She has no wheezes. She has no rales. She exhibits no tenderness.  Abdominal: Soft. Bowel sounds are normal. She exhibits no distension. There is no tenderness.  Musculoskeletal: Normal range of motion. She exhibits no edema or tenderness.  Lymphadenopathy:    She has no cervical adenopathy.  Neurological: She is alert and oriented to person, place, and time. Coordination normal.  Skin: Skin is warm and dry. No rash noted. No erythema.  Psychiatric: She has a normal mood and affect. Her behavior is normal. Judgment and thought content normal.    Assessment and Plan  Nursing note and vitals reviewed.

## 2015-03-09 ENCOUNTER — Ambulatory Visit: Payer: Self-pay | Admitting: Nurse Practitioner

## 2015-03-18 ENCOUNTER — Telehealth: Payer: Self-pay | Admitting: *Deleted

## 2015-03-18 NOTE — Telephone Encounter (Signed)
Guidelines do not recommend for antimicrobial prophylaxis in patients with a bicuspid aortic valve. Nor do they recommend for antimicrobial prophylaxis in patients undergoing GU procedures. So she is ok to proceed without antibiotic therapy.

## 2015-03-18 NOTE — Telephone Encounter (Signed)
Pt calling stating that she is having surgery and needs to know if she needs to "pre medicate" for it.   1. What type of surgery is being performed? Removal of ovaries   2. When is this surgery scheduled? April 11th   3. Are there any medications that need to be held prior to surgery and how long? Not sure  4. Name of physician performing surgery? Dr Aretta Nip   5. What is your office phone and fax number?   6.

## 2015-03-18 NOTE — Telephone Encounter (Signed)
Spoke w/ Deborah Koch. Advised her of Ryan's recommendation.  She is appreciative of the call and will call back w/ any further questions or concerns.

## 2015-03-25 ENCOUNTER — Ambulatory Visit
Admit: 2015-03-25 | Disposition: A | Discharge status: Home / Self Care | Payer: Self-pay | Attending: Obstetrics and Gynecology | Admitting: Obstetrics and Gynecology

## 2015-03-25 LAB — CBC
HCT: 37.6 % (ref 35.0–47.0)
HGB: 12.7 g/dL (ref 12.0–16.0)
MCH: 32.4 pg (ref 26.0–34.0)
MCHC: 33.8 g/dL (ref 32.0–36.0)
MCV: 96 fL (ref 80–100)
Platelet: 341 10*3/uL (ref 150–440)
RBC: 3.92 10*6/uL (ref 3.80–5.20)
RDW: 13.8 % (ref 11.5–14.5)
WBC: 6 10*3/uL (ref 3.6–11.0)

## 2015-03-26 LAB — RAPID HIV SCREEN (HIV 1/2 AB+AG)

## 2015-03-29 ENCOUNTER — Ambulatory Visit
Admit: 2015-03-29 | Disposition: A | Discharge status: Home / Self Care | Payer: Self-pay | Attending: Obstetrics and Gynecology | Admitting: Obstetrics and Gynecology

## 2015-04-10 NOTE — Discharge Summary (Signed)
PATIENT NAME:  Deborah, Koch MR#:  [id number] DATE OF BIRTH:  12-14-1956  DATE OF ADMISSION:  07/18/2014 DATE OF DISCHARGE:  07/24/2014  ADMITTING DIAGNOSES: Abdominal pain, diarrhea.   DISCHARGE DIAGNOSES:  1. Abdominal pain and diarrhea, due to acute C. difficile colitis. Also possibly underlying diverticulitis as well.  2. Shortness of breath due to fluid overload. Echocardiogram is currently pending.  3. Intractable nausea.  4. Gastroesophageal reflux disease.  5. Ovarian cyst for which she needs outpatient followup with gynecology.   CONSULTANTS DURING HOSPITALIZATION: Dr. Ola Spurr, Dr. Allen Norris and Dr. Burt Knack.    PERTINENT LABS AND EVALUATIONS: Admitting glucose 92, BUN 4, creatinine 0.60, sodium 129, potassium 4, chloride was 9.5, CO2 is 26, lipase 76. LFTs were normal. WBC 12.4, hemoglobin 12.7, platelet count 364,000. Stool cultures: No pathogens identified. C. difficile positive. CT scan done on July 30th showed sigmoid diverticulosis, no abscess, wall thickening is noted. A 4.2 cm ovarian cyst.    HOSPITAL COURSE: Please refer to H and P done by the admitting physician. The patient is a 59 year old white female with history of reflux, hypertension, depression, who presented with abdominal pain. She was started on Cipro and Flagyl for presumed diverticulitis. She continued to have symptoms, so she will was seen by GI and was started on Augmentin as well. She continued to have the symptoms with abdominal pain and diarrhea and came to the ED. Initially she was started on IV antibiotics. However, her stools came back for C. difficile positive. The patient was very slow to improve. She was seen in consultation by GI. She will eventually need a colonoscopy. She was also seen in consultation by surgery, they feel that she may need surgery in the future. Dr. Ola Spurr saw the patient since her fevers resolved, her WBC count normalized, her diarrhea was improving. He recommended just treating  this C. difficile. The patient may have underlying diverticulitis as well, however, once a C. difficile is completely treated than that will be unmasked. At this time, she is doing much better. Her diarrhea is much improved. Her abdominal pain is mostly resolved and she is stable for discharge.   DISCHARGE MEDICATIONS: Clonazepam 0.5 mg 1 tab p.o. b.i.d., nortriptyline 25 q. daily, omeprazole 20 q. daily, metoprolol succinate 12.5. p.o. q. daily, lisinopril 20 q. daily, Tylenol 650 q. 4 p.r.n., vancomycin 250 mg per 5 mL solutions every q. 6 hours for 9 more days, lactobacillus 1 tablet  p.o. t.i.d., KCl 20 mEq p.o. q. daily x 3 days.   DIET: Low fat, low cholesterol.   ACTIVITY: As tolerated.   Follow-up primary M.D. in 1 to 2 weeks. Follow up with Dr. Allen Norris in 2 to 4 weeks. Follow with Dr. Rockey Situ in 2 to 4 weeks. If she has recurrent issues with infection then needs to see Dr. Ola Spurr as an outpatient.   TIME SPENT: 35 minutes.    ____________________________ Lafonda Mosses Posey Pronto, MD shp:jh D: 07/24/2014 14:25:36 ET T: 07/24/2014 23:41:22 ET JOB#: [id number]  cc: Eduin Friedel H. Posey Pronto, MD, <Dictator> Alric Seton MD ELECTRONICALLY SIGNED 07/29/2014 10:38

## 2015-04-10 NOTE — Consult Note (Signed)
Chief Complaint:  Subjective/Chief Complaint Pt c/o abdominal bloating, foul-smelling gas & 4 loose stools in 24 hrs.   VITAL SIGNS/ANCILLARY NOTES: **Vital Signs.:   03-Aug-15 07:34  Vital Signs Type Q 4hr  Temperature Temperature (F) 98.7  Celsius 37  Temperature Source oral  Pulse Pulse 97  Respirations Respirations 18  Systolic BP Systolic BP 716  Diastolic BP (mmHg) Diastolic BP (mmHg) 77  Mean BP 89  Pulse Ox % Pulse Ox % 91  Pulse Ox Activity Level  At rest  Oxygen Delivery Room Air/ 21 %   Brief Assessment:  GEN well developed, well nourished, no acute distress   Cardiac Regular   Respiratory normal resp effort   Gastrointestinal details normal Soft  Bowel sounds normal  No rebound tenderness  No gaurding  No rigidity  +moderate distention   EXTR negative cyanosis/clubbing, negative edema   Additional Physical Exam Skin: pink, warm, dry   Lab Results: Routine Chem:  03-Aug-15 04:06   Glucose, Serum 92  BUN  3  Creatinine (comp) 0.77  Sodium, Serum  130  Chloride, Serum 99  CO2, Serum 28  Calcium (Total), Serum  7.9  Anion Gap  3  Osmolality (calc) 257  eGFR (African American) >60  eGFR (Non-African American) >60 (eGFR values <95mL/min/1.73 m2 may be an indication of chronic kidney disease (CKD). Calculated eGFR is useful in patients with stable renal function. The eGFR calculation will not be reliable in acutely ill patients when serum creatinine is changing rapidly. It is not useful in  patients on dialysis. The eGFR calculation may not be applicable to patients at the low and high extremes of body sizes, pregnant women, and vegetarians.)  Routine Hem:  03-Aug-15 04:06   WBC (CBC)  13.9  RBC (CBC)  3.28  Hemoglobin (CBC)  10.5  Hematocrit (CBC)  30.5  Platelet Count (CBC) 293  MCV 93  MCH 32.2  MCHC 34.5  RDW 12.9  Neutrophil % 78.3  Lymphocyte % 9.8  Monocyte % 10.3  Eosinophil % 1.4  Basophil % 0.2  Neutrophil #  10.9  Lymphocyte #  1.4  Monocyte #  1.4  Eosinophil # 0.2  Basophil # 0.0 (Result(s) reported on 20 Jul 2014 at 05:03AM.)   Assessment/Plan:  Assessment/Plan:  Assessment C diff colitis:  On PO vancomycin.  Slow to improve. Refractory diverticulitis   Plan 1) Continue PO vanc QID 2) If no improvement in 24-48 hrs, consider surgical evaluation 3) continue supportive measures Please call if you have any questions or concerns   Electronic Signatures: Andria Meuse (NP)  (Signed 03-Aug-15 10:35)  Authored: Chief Complaint, VITAL SIGNS/ANCILLARY NOTES, Brief Assessment, Lab Results, Assessment/Plan   Last Updated: 03-Aug-15 10:35 by Andria Meuse (NP)

## 2015-04-10 NOTE — Consult Note (Signed)
Chief Complaint:  Subjective/Chief Complaint Pt c/o abdominal bloating, foul-smelling gas & diarrhea improving with 2 loose stools this AM.   VITAL SIGNS/ANCILLARY NOTES: **Vital Signs.:   04-Aug-15 07:54  Vital Signs Type Q 4hr  Temperature Temperature (F) 97.7  Celsius 36.5  Temperature Source oral  Pulse Pulse 87  Respirations Respirations 18  Systolic BP Systolic BP 127  Diastolic BP (mmHg) Diastolic BP (mmHg) 81  Mean BP 96  Pulse Ox % Pulse Ox % 97  Pulse Ox Activity Level  At rest  Oxygen Delivery Room Air/ 21 %   Brief Assessment:  GEN well developed, well nourished, no acute distress, Husband at bedside   Cardiac Regular   Respiratory normal resp effort   Gastrointestinal details normal Soft  Bowel sounds normal  No rebound tenderness  No gaurding  No rigidity  +moderate distention   EXTR negative cyanosis/clubbing, negative edema   Additional Physical Exam Skin: pink, warm, dry   Lab Results: Routine Chem:  04-Aug-15 04:30   Glucose, Serum  111  BUN  2  Creatinine (comp) 0.60  Sodium, Serum  133  Potassium, Serum  2.6  Chloride, Serum  97  CO2, Serum 28  Calcium (Total), Serum  7.8  Anion Gap 8  Osmolality (calc) 263  eGFR (African American) >60  eGFR (Non-African American) >60 (eGFR values <64mL/min/1.73 m2 may be an indication of chronic kidney disease (CKD). Calculated eGFR is useful in patients with stable renal function. The eGFR calculation will not be reliable in acutely ill patients when serum creatinine is changing rapidly. It is not useful in  patients on dialysis. The eGFR calculation may not be applicable to patients at the low and high extremes of body sizes, pregnant women, and vegetarians.)  Routine Hem:  04-Aug-15 04:30   WBC (CBC)  12.8  RBC (CBC)  3.07  Hemoglobin (CBC)  9.9  Hematocrit (CBC)  28.5  Platelet Count (CBC) 276  MCV 93  MCH 32.3  MCHC 34.9  RDW 12.9  Neutrophil % 76.8  Lymphocyte % 11.3  Monocyte % 6.9   Eosinophil % 4.6  Basophil % 0.4  Neutrophil #  9.8  Lymphocyte # 1.4  Monocyte # 0.9  Eosinophil # 0.6  Basophil # 0.0 (Result(s) reported on 21 Jul 2014 at 05:19AM.)   Radiology Results: XRay:    03-Aug-15 12:33, Abdomen Complete 3 or more views of abd  Abdomen Complete 3 or more views of abd   REASON FOR EXAM:    distenion and pain  COMMENTS:       PROCEDURE: DXR - DXR ABDOMEN COMPLETE  - Jul 20 2014 12:33PM     CLINICAL DATA:  Abdominal pain and distention. Current history of  sigmoid diverticulitis.    EXAM:  ACUTE ABDOMEN SERIES (2 VIEWABDOMEN AND 1 VIEW CHEST)    COMPARISON:  CT abdomen and pelvis 07/16/2014, 06/26/2014.    FINDINGS:  Since the CT examination 5 days ago, development of gas within  normal caliber ascending, transverse and descending colon. Air-fluid  levels on the erect image consistent with liquid stool. Mild gaseous  distension of the sigmoid colon, also with air-fluid levels. No  small bowel distention. No free intraperitoneal air.     IMPRESSION:  Query developing functional colonic obstruction due to the sigmoid  diverticulitis and marked wall thickening identified on the CT 5  days ago. No evidence of overt obstruction currently. No free  intraperitoneal air.      Electronically Signed  By: Evangeline Dakin M.D.    On: 07/20/2014 12:39     Verified By: Deniece Portela, M.D.,   Assessment/Plan:  Assessment/Plan:  Assessment C diff colitis:  On PO vancomycin.  She has improving but slowly. Refractory diverticulitis   Plan 1) Continue PO vanc $Remov'250mg'nMuatQ$  QID 2) Agree with ID consult for any additional suggestions regarding antibiotic management 3) continue supportive measures Please call if you have any questions or concerns   Electronic Signatures: Andria Meuse (NP)  (Signed 04-Aug-15 12:17)  Authored: Chief Complaint, VITAL SIGNS/ANCILLARY NOTES, Brief Assessment, Lab Results, Radiology Results, Assessment/Plan   Last  Updated: 04-Aug-15 12:17 by Andria Meuse (NP)

## 2015-04-10 NOTE — Consult Note (Signed)
Brief Consult Note: Diagnosis: diverticulitis and C diff colitis.   Patient was seen by consultant.   Consult note dictated.   Recommend further assessment or treatment.   Discussed with Attending MD.   Comments: C diff and acute diverticulitis dilema about which dz is more prominent and therefore whicjh one to more aggressively treat. Currnetly diverticulitis is not being treated at all. Discussed with Dr Allen Norris and Posey Pronto. Rec ID consult to assist in ABX management. Over 1 hr spent withj pt family and discussions.  Electronic Signatures: Florene Glen (MD)  (Signed 04-Aug-15 11:35)  Authored: Brief Consult Note   Last Updated: 04-Aug-15 11:35 by Florene Glen (MD)

## 2015-04-10 NOTE — Consult Note (Signed)
PATIENT NAME:  Deborah Koch, Deborah Koch MR#:  [id number] DATE OF BIRTH:  04-29-1956  DATE OF CONSULTATION:  07/21/2014  REFERRING PHYSICIAN:  Srikar R. Sudini, MD.  CONSULTING PHYSICIAN:  Cheral Marker. Ola Spurr, MD  REASON FOR CONSULTATION:  Diverticulitis.   HISTORY OF PRESENT ILLNESS: This is a very pleasant 59 year old, previously quite healthy female, who began having abdominal symptoms on July 1. She presented initially to her gynecologist who initially did ultrasound, and found an ovarian cyst, but then was sent for a CT scan done on July 10, that showed evidence of sigmoid diverticulitis.  She was treated at that time a 14-day course of Cipro and Flagyl. She reports her symptoms improved somewhat by the end of the course.  Shortly after, they did recur, however.  She was then seen by GI and switched to Augmentin. Her symptoms persisted, however, so she represented and had a CT scan done July 30.  This showed continued evidence of some wall thickening in the sigmoid colon and evidence of diverticulosis. There is no abscess or extraluminal gas. The pain and diarrhea, however, persisted and she was admitted on August 1.  Her main symptoms were profound diarrhea as well. She ended up having a positive C. difficile sample. We are consulted for further antibiotics .  She was also having, at that time, nausea and vomiting. On admission, her white blood count was 12.4. Since admission, she has been switched to oral vancomycin and her other antibiotics have stopped.   PAST MEDICAL HISTORY:   1.  GERD. 2.  Depression. 3.  Hypertension.   ALLERGIES: No known drug allergies.   SOCIAL HISTORY: Lives with her husband. Denies alcohol or other smokes.   FAMILY HISTORY: Positive for hypertension.   REVIEW OF SYSTEMS:  Eleven systems reviewed and negative except as per HPI.  ANTIBIOTICS SINCE ADMISSION:  Zosyn on August 1, but then stopped.  Oral vancomycin was begun on August 1. Ertapenem was given on August 1 as  well.  PHYSICAL EXAMINATION: VITAL SIGNS: Temperature 97.8, pulse 80, blood pressure 120/80, respirations 18, saturation 92% on room air.  Since admission, she has had a temperature maximum of 101.1 on August 2.  GENERAL: She is lying in bed in no acute distress.  HEENT: Pupils equal, round, reactive to light and accommodation. Extraocular movements are intact. Sclerae anicteric. Oropharynx clear.  NECK: Supple.  HEART: Regular.  LUNGS: Clear.  ABDOMEN: Distended. Bowel sounds are hyperactive. She has mild diffuse tenderness and multiple   in the left lower quadrant. She is tympanic.  EXTREMITIES: No clubbing, cyanosis or edema.  NEUROLOGIC: She is alert and oriented x3. Grossly nonfocal neuro exam.  LABORATORY DATA:  CT July 30, compared to July 10, shows sigmoid diverticulosis with some wall thickening, mildly worsened since prior CAT scan.  There is no abscess or extraluminal gas. There is also a 4 cm right ovarian cyst. No ascites is noted.  There was some abnormal inflammatory stranding on the mesentery surrounding the sigmoid colon.  White blood count on admission was 12.4, currently it is 12.8, hemoglobin 9.9, platelets 276,000. LFTs are normal, except for albumin low at 3.4. Renal function is normal with a creatinine of 0.6. BUN is quite low at 2.  Stool C. difficile is positive. Stool cultures are negative. Stool white blood cell count showed none seen.  Urinalysis was negative.   IMPRESSION:  77.  A 59 year old previously quite healthy female who had abdominal pain since July 1.  She has been treated  with course of Cipro, Flagyl and then Augmentin with waxing and waning of her symptoms. She then also has profound diarrhea. Her C. difficile is positive.  CT scan does show some colonic thickening. No discrete abscess or free air. Clinically, over the last 24 hours since starting oral vancomycin, she has defervesced and reports that her diarrhea has decreased from 20 a day, to just 2.  Her  abdominal pain remains the same. Difficult to tell if she has residual diverticulitis or this is all C. difficile.  2.  If she continues to clinically improve, I would treat her just with oral vancomycin for the C. difficile.  She will certainly need followup colonoscopy and also follow up with gynecology regarding the 4 cm ovarian abscess.   Thank you for the consult. I will be glad to follow with you.     ____________________________ Cheral Marker. Ola Spurr, MD dpf:ds D: 07/21/2014 21:57:41 ET T: 07/22/2014 01:51:04 ET JOB#: [id number]  cc: Cheral Marker. Ola Spurr, MD, <Dictator> Sofya Moustafa Ola Spurr MD ELECTRONICALLY SIGNED 07/24/2014 22:06

## 2015-04-10 NOTE — Consult Note (Signed)
Chief Complaint:  Subjective/Chief Complaint Pt denies abdominal pain.  c/o cough & feels like she cannot get good breath.  3 loose stools in 24 hrs.   VITAL SIGNS/ANCILLARY NOTES: **Vital Signs.:   05-Aug-15 08:22  Vital Signs Type Q 4hr  Temperature Temperature (F) 98.5  Celsius 36.9  Temperature Source oral  Pulse Pulse 112  Respirations Respirations 20  Systolic BP Systolic BP 341  Diastolic BP (mmHg) Diastolic BP (mmHg) 962  Mean BP 118  Pulse Ox % Pulse Ox % 97  Pulse Ox Activity Level  At rest  Oxygen Delivery Room Air/ 21 %   Brief Assessment:  GEN well developed, well nourished, no acute distress, Husband at bedside   Cardiac Regular   Respiratory normal resp effort   Gastrointestinal details normal Soft  Bowel sounds normal  No rebound tenderness  No gaurding  No rigidity  +moderate distention   EXTR negative cyanosis/clubbing, negative edema   Additional Physical Exam Skin: pink, warm, dry   Lab Results: Routine Chem:  05-Aug-15 04:08   Glucose, Serum 85  BUN  < 1  Creatinine (comp) 0.63  Sodium, Serum  134  Potassium, Serum 4.9  Chloride, Serum 104  CO2, Serum 24  Calcium (Total), Serum  7.8  Anion Gap  6  Osmolality (calc) SEE COMMENT  eGFR (African American) >60  eGFR (Non-African American) >60 (eGFR values <41m/min/1.73 m2 may be an indication of chronic kidney disease (CKD). Calculated eGFR is useful in patients with stable renal function. The eGFR calculation will not be reliable in acutely ill patients when serum creatinine is changing rapidly. It is not useful in  patients on dialysis. The eGFR calculation may not be applicable to patients at the low and high extremes of body sizes, pregnant women, and vegetarians.)  Result Comment OSMOLALITY - UNABLE TO CALCULATE VALUE DUE TO NON-  - NUMERIC VALUE WITHIN THE CALCULATION.  Result(s) reported on 22 Jul 2014 at 04:48AM.  Routine Hem:  05-Aug-15 04:08   WBC (CBC) 9.3  RBC (CBC)  3.11   Hemoglobin (CBC)  10.2  Hematocrit (CBC)  28.8  Platelet Count (CBC) 312  MCV 93  MCH 32.8  MCHC 35.4  RDW 13.0  Neutrophil % 67.5  Lymphocyte % 18.5  Monocyte % 7.4  Eosinophil % 6.0  Basophil % 0.6  Neutrophil # 6.3  Lymphocyte # 1.7  Monocyte # 0.7  Eosinophil # 0.6  Basophil # 0.1 (Result(s) reported on 22 Jul 2014 at 04:48AM.)   Assessment/Plan:  Assessment/Plan:  Assessment C diff colitis:  On PO vancomycin.  Continued improvement Refractory diverticulitis: Appreciate ID input Chest pain/SOB: Dr WAllen Norrisdiscussed with attending   Plan 1) Continue PO vanc [id number] 2) continue supportive measures 3) undergoing chest pain work-up through hospitalist Pt was seen & examined by Dr WoAllen Norrisoday as well. Please call if you have any questions or concerns.   Electronic Signatures: JoAndria MeuseNP)  (Signed 05-Aug-15 11:26)  Authored: Chief Complaint, VITAL SIGNS/ANCILLARY NOTES, Brief Assessment, Lab Results, Assessment/Plan   Last Updated: 05-Aug-15 11:26 by JoAndria MeuseNP)

## 2015-04-10 NOTE — Consult Note (Signed)
PATIENT NAME:  Deborah Koch, Deborah Koch MR#:  [id number] DATE OF BIRTH:  Sep 01, 1956  DATE OF CONSULTATION:  07/19/2014  REFERRING PHYSICIAN:   CONSULTING PHYSICIAN:  Lucilla Lame, MD  CONSULTING SERVICE: Gastroenterology.   REASON FOR CONSULTATION: Diverticulitis.   HISTORY OF PRESENT ILLNESS: This patient is a 59 year old woman who was recently diagnosed with diverticulitis. The patient states that she was treated by Dr. Marcie Bal Dear with antibiotics. She states she was given 2 rounds of antibiotics, but still continued to have abdominal pain and bloating. The patient also reports that she had 2 CT scans showing inflammation consistent with diverticulitis. The patient has had a colonoscopy in 2013 by Dr. Vira Agar where she states she had 1 polyp and diverticulosis. The patient had been placed on Cipro and Flagyl and then switched to Augmentin. She continued to have abdominal pain and a CT scan showing diverticulitis, so she was admitted to the hospital.   PAST MEDICAL HISTORY: Depression, hypertension, GERD.  MEDICATIONS: Omeprazole, nortriptyline, Klonopin, Toprol-XL, Zestril.  ALLERGIES: No known drug allergies.   SOCIAL HISTORY: No tobacco, alcohol, or drugs.   FAMILY HISTORY: Noncontributory.  REVIEW OF SYSTEMS: A 10-point review of systems negative except what was stated above.   PHYSICAL EXAMINATION:  VITAL SIGNS: Temperature 101.1, pulse 104, respirations 18, blood pressure 113/75, pulse oximetry 94% on room air.  HEENT: Normocephalic, atraumatic. Extraocular motor intact. Pupils equally round and reactive to light and accommodation. NECK: Without JVD, without lymphadenopathy.  LUNGS: Clear to auscultation bilaterally.  HEART: Regular rate, rhythm with a murmur heard on exam. S1, S2 heard.  ABDOMEN: Soft, nondistended with positive tenderness. No rebound, no guarding. Decreased bowel sounds.  EXTREMITIES: Without cyanosis, clubbing, or edema.  SKIN: Warm and dry.  NEUROLOGICAL: Grossly  intact.  PSYCHIATRIC: Alert and oriented x 3, in no apparent distress.  ANCILLARY SERVICES: CT scan with findings consistent with diverticulitis. White cell count 12.4 on admission, 14.5 this morning. The patient had a Clostridium difficile test that was positive today.   ASSESSMENT AND PLAN: This patient is a 59 year old woman who has what appears to be diverticulitis and Clostridium difficile colitis. The patient is presently on piperacillin/tazobactam with metronidazole and vancomycin. The patient should continue on this treatment and consider changing the Flagyl to IV if the patient's symptoms do not improve. The patient will need a repeat colonoscopy in approximately 8 weeks after being discharged and after her diverticulitis has resolved. The patient will also have a surgical consult if her symptoms do not improve over the next 24 to 48 hours. I have discussed the plan at length with the patient's family, and they agree with the plan.  Thank you very much for involving me in the care of this patient. If you have any questions, please do not hesitate to call.    ____________________________ Lucilla Lame, MD dw:sk D: 07/19/2014 20:02:41 ET T: 07/19/2014 21:32:36 ET JOB#: [id number]  cc: Lucilla Lame, MD, <Dictator> Lucilla Lame MD ELECTRONICALLY SIGNED 07/21/2014 8:03

## 2015-04-10 NOTE — H&P (Signed)
PATIENT NAME:  Deborah Koch, ROMBERG MR#:  [id number] DATE OF BIRTH:  [birth year]  DATE OF ADMISSION:  07/18/2014  REFERRING PHYSICIAN:  Dr. Jasmine December.   FAMILY PHYSICIAN:  [facility].   REASON FOR ADMISSION:  Abdominal pain.   HISTORY OF PRESENT ILLNESS:  The patient is a 59 year old female with a history of reflux, hypertension and depression.  Developed abdominal pain approximately one month ago at which time she was seen by GYN and placed on Cipro and Flagyl for presumed diverticulitis.  Her symptoms improved, but did not go away.  She was then referred to GI recently.  CT suggested inflammation consistent with diverticulitis and she was switched from Cipro and Flagyl to Augmentin.  Presents to the Emergency Room today with nausea, vomiting, diarrhea and worsening abdominal pain.  She was found to have an elevated white count and is now admitted for further evaluation.   PAST MEDICAL HISTORY: 1.  GE reflux disease.  2.  Depression.  3.  Benign hypertension.   MEDICATIONS: 1.  Omeprazole 20 mg by mouth daily.  2.  Nortriptyline 25 mg by mouth at bedtime.  3.  Klonopin 0.5 mg by mouth twice daily as needed.  4.  Toprol-XL 25 mg by mouth daily.  5.  Zestril 20 mg by mouth daily.   ALLERGIES:  No known drug allergies.   SOCIAL HISTORY:  The patient smokes rarely.  Denies alcohol abuse.   FAMILY HISTORY:  Positive for hypertension, but otherwise unremarkable.   REVIEW OF SYSTEMS:  CONSTITUTIONAL:  No fever or change in weight.  EYES:  No blurred or double vision.  No glaucoma.  EARS, NOSE, THROAT:  No tinnitus, hearing loss.  No nasal discharge or bleeding.  No difficulty swallowing.  RESPIRATORY:  No cough or wheezing.  Denies hemoptysis.  CARDIOVASCULAR:  No chest pain or orthopnea.  No palpitations or syncope.  GASTROINTESTINAL:  As per HPI.  GENITOURINARY:  No dysuria or hematuria.  No incontinence.  ENDOCRINE:  No polyuria or polydipsia.  No heat or cold intolerance.   HEMATOLOGIC:  The patient denies anemia, easy bruising, or bleeding.  LYMPHATIC:  No swollen glands.  MUSCULOSKELETAL:  The patient denies pain in her neck, back, shoulders, knees, or hips.  No gout.  NEUROLOGIC:  No numbness or migraines.  Denies stroke or seizures.  PSYCHOLOGICAL:  The patient denies insomnia.  Admits to occasional anxiety.  States that her depression is stable.   PHYSICAL EXAMINATION: GENERAL:  The patient is in no acute distress.  VITAL SIGNS:  Currently remarkable for a blood pressure of 135/87, heart rate 92, respiratory rate of 18, temperature of 97.9.  HEENT:  Normocephalic, atraumatic.  Pupils equally round and reactive to light and accommodation, extraocular movements are intact.  Sclerae are not icteric.  Conjunctivae are clear.  Oropharynx is clear.  NECK:  Supple without JVD.  No adenopathy or thyromegaly is noted.  LUNGS:  Clear to auscultation and percussion without wheezes, rales or rhonchi.  No dullness.  Respiratory effort is normal.  CARDIAC:  Regular rate and rhythm with normal S1, S2.  No significant rubs, murmurs or gallops.  PMI is nondisplaced.  Chest wall is nontender.  ABDOMEN:  Soft, but tender.  No rebound or guarding.  Normoactive bowel sounds.  No organomegaly or masses were appreciated.  No hernias or bruits were noted.  EXTREMITIES:  Without clubbing, cyanosis, edema.  Pulses were 2+ bilaterally.  SKIN:  Warm and dry without rash or lesions.  NEUROLOGIC:  Cranial nerves II through XII grossly intact.  Deep tendon reflexes were symmetric.  Motor and sensory exam is nonfocal.  PSYCHIATRIC:  Revealed a patient who is alert and oriented to person, place, and time.  She was cooperative and used good judgment.   LABORATORY DATA:  CT of the abdomen and pelvis reveals a right ovarian cyst with colonic inflammation which is mildly worse from the previous exam.  CBC shows a white count of 12.4 with a hemoglobin of 12.7.  Lipase is 76 with a BUN of 4,  creatinine of 0.60, glucose 92, sodium 129, potassium of 4.0.  Urinalysis unremarkable.   ASSESSMENT: 1.  Acute diverticulitis, unresponsive to oral medications.  2.  Hyponatremia.  3.  Abdominal pain.  4.  Intractable nausea.   PLAN:  The patient will be admitted to the floor with intravenous antibiotics and intravenous fluids.  Clear liquid diet.  Stool studies will be sent.  We will consult GI.  Follow up routine labs in the morning.  Continue her blood pressure medications as well as her clonazepam and nortriptyline.  Further treatment and evaluation will depend upon the patient's progress.   Total time spent on this patient was 50 minutes.    ____________________________ Leonie Douglas Doy Hutching, MD jds:ea D: 07/18/2014 17:30:44 ET T: 07/18/2014 18:15:09 ET JOB#: [id number]  cc: Leonie Douglas. Doy Hutching, MD, <Dictator> Jennesis Ramaswamy Lennice Sites MD ELECTRONICALLY SIGNED 07/19/2014 15:20

## 2015-04-10 NOTE — Consult Note (Signed)
Brief Consult Note: Diagnosis: Patient with diverticulitis dispite 2 rounds of antibiotics PO as out patient. Now with bloating and abd. pain with diarrhea.   Patient was seen by consultant.   Comments: Continue IV antibiotics. If not improved in 48 hours then consider surgical consult.  Electronic Signatures: Lucilla Lame (MD)  (Signed 02-Aug-15 10:08)  Authored: Brief Consult Note   Last Updated: 02-Aug-15 10:08 by Lucilla Lame (MD)

## 2015-04-10 NOTE — Consult Note (Signed)
PATIENT NAME:  Deborah Koch, Deborah Koch MR#:  [id number] DATE OF BIRTH:  Aug 13, 1956  DATE OF CONSULTATION:  07/21/2014  CONSULTING PHYSICIAN:  Jerrol Banana. Burt Knack, MD  CHIEF COMPLAINT: Lower quadrant abdominal pain.   HISTORY OF PRESENT ILLNESS: This is a 59 year old female patient I was consulted on for ongoing abdominal pain. She describes abdominal pain in both lower quadrants, left greater than right, starting around July 1. She was treated with oral Cipro and Flagyl for presumed diverticulitis with CT findings of diverticulitis.  When that initial course of antibiotics ran out, she had several days of intervening nontreatment and then worsened and was placed on Augmentin. After approximately 5 days of Augmentin, she was admitted to the hospital, where a followup CT scan suggested ongoing diverticulitis and a followup C. difficile toxin titer demonstrated positivity, suggesting C. difficile colitis on top of active acute diverticulitis.   Currently, she describes no nausea, vomiting.  She is having loose diarrheal stools. No fevers or chills, has never had an episode like this prior to July 1. No melena or hematochezia.   PAST MEDICAL HISTORY: Reflux disease, depression, and hypertension.   MEDICATIONS: Multiple, see chart.   ALLERGIES: None.   SOCIAL HISTORY: The patient smokes tobacco, but only occasionally and drinks alcohol weekly.  FAMILY HISTORY:  A brother with, what sounds like, a sigmoid colectomy for diverticulitis and/or colon cancer with no further treatment and her mother had probably colon cancer as well. No chemotherapy treatment.   REVIEW OF SYSTEMS:  Ten system review is performed and negative with the exception of that mentioned in the HPI.   ADDITIONAL HISTORY: The patient has had a colonoscopy every 5 years, most recently 2 years ago.   PHYSICAL EXAMINATION: GENERAL: Healthy, comfortable-appearing female patient. She is sitting up moves about quite easily. VITAL SIGNS:   Temperature of 97.5, pulse of 84, respirations 18, blood pressure 112/75, oxygen saturation 92%.  HEENT: No scleral icterus.  NECK: No palpable neck nodes.  CHEST: Clear to auscultation.  CARDIAC: Regular rate and rhythm.  ABDOMEN: Soft, distended, slightly tympanitic. No guarding, no rebound or percussion tenderness. Tenderness, minimally in the left lower quadrant and less so in the right lower quadrant. No peritoneal signs.  EXTREMITIES: Without edema. Calves are nontender.  NEUROLOGIC: Grossly intact.  INTEGUMENT: No jaundice.   LABORATORY, DIAGNOSTIC AND RADIOLOGICAL DATA: CT scan is personally reviewed suggesting sigmoid diverticulitis. Creatinine is 0.6, potassium of 2.6. White blood cell count of 12.8, H and H of  9.9 and 28.5, platelet count of 276,000.   ASSESSMENT AND PLAN: This is a patient with acute diverticulitis, and C. difficile colitis. Currently, all antibiotics have been stopped, except oral vancomycin to treat her C. difficile colitis. I discussed the patient with Dr. Allen Norris, GI consult and with Dr. Posey Pronto. I believe this patient represents a very complicated situation, in which she has 2 diseases, both of which required antibiotic therapy of differing spectrums. The inability to determine which portion of her disease is causing the majority of her symptoms is difficult at this point and I suggested to both consultant and primary physician that she would benefit from an infectious disease consultation because of the difficulty in determining exactly which of spectrum of antibiotics would be best suited for her opposing microbial diseases. They were all in agreement with this plan. At this point, I have no surgical options for this patient other than a Hartman procedure which would not treat the C. difficile and might make it worse in  fact.  Therefore, I will follow the patient, but at this point, I have no surgical intervention recommended. Approximately 1-1/2 hours was spent discussed  this patient, seeing the patient and reviewing her studies.   ____________________________ Jerrol Banana Burt Knack, MD rec:ds D: 07/21/2014 16:27:02 ET T: 07/21/2014 17:28:31 ET JOB#: [id number]  cc: Jerrol Banana. Burt Knack, MD, <Dictator> Florene Glen MD ELECTRONICALLY SIGNED 07/21/2014 19:00

## 2015-04-12 LAB — SURGICAL PATHOLOGY

## 2015-04-18 NOTE — Op Note (Signed)
PATIENT NAME:  Deborah Koch, Deborah Koch MR#:  [id number] DATE OF BIRTH:  08-13-1956  DATE OF PROCEDURE:  03/29/2015  PREOPERATIVE DIAGNOSIS:  Symptomatic right ovarian cyst, simple, postmenopausal.   POSTOPERATIVE DIAGNOSIS:  Symptomatic right ovarian cyst, simple, postmenopausal.  OPERATIVE PROCEDURE: Laparoscopic bilateral salpingo-oophorectomy.    SURGEON: Alanda Slim. Jazzlyn Huizenga, M.D.   FIRST ASSISTANT: Dr. Chesley Noon. Marcelline Mates, M.D.   SECOND ASSISTANT:  Chyrl Civatte, PA-S.   ANESTHESIA: General endotracheal.   INDICATIONS: The patient is a 59 year old white female who presents for surgical management of symptomatic right ovarian cyst. The patient had chronic right lower quadrant pain which was worsening recently. Work-up included pelvic ultrasound that demonstrated a simple right ovarian cyst measuring 4.8 cm.  CA 125 was normal.   FINDINGS AT SURGERY REVEALED:  A simple right ovarian cyst 5 cm in diameter; the aspirate fluid from the cyst was serosanguineous. Left tube and ovary, uterus, peritoneal surfaces, liver, were normal.   DESCRIPTION OF PROCEDURE: The patient was brought to the operating room where she was placed in the supine position. General endotracheal anesthesia was induced without difficulty. She was placed in dorsal lithotomy position using the bumblebee stirrups. A ChloraPrep and Betadine abdominal, perineal, intravaginal prep and drape was performed in standard fashion. A red Robinson catheter was used to drain 100 mL of urine from the bladder. A Hulka tenaculum was placed onto the cervix to facilitate uterine manipulation. The procedure was then performed in standard fashion. Subumbilical vertical incision 5 mm in length was made. The Optiview laparoscopic trocar system was used to place a 5 mm port for camera placement directly into the abdominopelvic cavity without evidence of bowel or vascular injury. An 11 mm port was placed in the right lower quadrant under direct visualization.  A 5 mm port was placed in the left lower quadrant under direct visualization. The above-noted findings were photo documented. The laparoscopic oophorectomies were then performed in standard fashion. The Ace Harmonic scalpel was used along with graspers to facilitate the procedure. The infundibulopelvic ligament was clamped, coagulated, and cut using the Harmonic scalpel. The mesosalpinx likewise was clamped, desiccated, and cut down to the level of the uterine cornua. The specimen was then cross-clamped and desiccated at this junction with separation of the left tube and ovary from the operative field. The incision line on the left was then cauterized using Kleppinger bipolar forceps. The excision site was noted to be remote from the functional ureter. A similar procedure was carried out on the right side using the Ace Harmonic scalpel and graspers. No fulguration with Kleppingers was needed on the right side. The Endo Catch bag system was then used to place the simple ovarian cyst into the removal apparatus. Once this was brought to the surface of the incision, a needle aspirate was performed to decompress the cyst to allow it to come through the 11 mm port site. Serosanguineous fluid was obtained with the aspiration. The remaining tube and ovary were then also taken out through the same 11 mm port site. Once satisfied with hemostasis, the pneumoperitoneum was released.  The incisions were closed with 0 Vicryl being used on the fascia of the 11 mm port site in a figure-of-eight manner. The skin was closed with subcuticular stitches of 4-0 Vicryl. Telfa and Tegaderm bandage coverings were placed. The patient was then awakened, extubated, and taken to the recovery room in satisfactory condition.   ESTIMATED BLOOD LOSS: 15 mL.  INTRAVENOUS FLUIDS:  700 mL.   URINE OUTPUT: 100  mL.  COUNTS:  All instruments, needle, and sponge counts were verified as correct.   Please note that prior to the BSO, peritoneal  washings were obtained.   ____________________________ Alanda Slim. Artemio Dobie, MD mad:sp D: 03/29/2015 12:10:14 ET T: 03/29/2015 16:13:09 ET JOB#: [id number]  cc: Hassell Done A. Jamion Carter, MD, <Dictator> [facility] MD ELECTRONICALLY SIGNED 03/31/2015 17:18

## 2015-06-08 ENCOUNTER — Encounter: Payer: Self-pay | Admitting: Psychiatry

## 2015-06-08 ENCOUNTER — Ambulatory Visit: Payer: BLUE CROSS/BLUE SHIELD | Admitting: Psychiatry

## 2015-06-08 ENCOUNTER — Other Ambulatory Visit: Payer: Self-pay

## 2015-06-08 ENCOUNTER — Ambulatory Visit (INDEPENDENT_AMBULATORY_CARE_PROVIDER_SITE_OTHER): Payer: BLUE CROSS/BLUE SHIELD | Admitting: Psychiatry

## 2015-06-08 VITALS — BP 122/78 | HR 92 | Temp 97.1°F | Ht 64.0 in

## 2015-06-08 DIAGNOSIS — F411 Generalized anxiety disorder: Secondary | ICD-10-CM

## 2015-06-08 DIAGNOSIS — F331 Major depressive disorder, recurrent, moderate: Secondary | ICD-10-CM | POA: Diagnosis not present

## 2015-06-08 DIAGNOSIS — F329 Major depressive disorder, single episode, unspecified: Secondary | ICD-10-CM | POA: Insufficient documentation

## 2015-06-08 DIAGNOSIS — I10 Essential (primary) hypertension: Secondary | ICD-10-CM | POA: Insufficient documentation

## 2015-06-08 DIAGNOSIS — Q231 Congenital insufficiency of aortic valve: Secondary | ICD-10-CM | POA: Insufficient documentation

## 2015-06-08 DIAGNOSIS — G4700 Insomnia, unspecified: Secondary | ICD-10-CM | POA: Insufficient documentation

## 2015-06-08 DIAGNOSIS — F32A Depression, unspecified: Secondary | ICD-10-CM | POA: Insufficient documentation

## 2015-06-08 MED ORDER — ESCITALOPRAM OXALATE 10 MG PO TABS: 10.0000 mg | ORAL_TABLET | Freq: Every day | ORAL | Status: DC

## 2015-06-08 NOTE — Progress Notes (Signed)
Psychiatric Initial Adult Assessment   Patient Identification: Deborah Koch MRN:  [id number] Date of Evaluation:  06/08/2015 Referral Source: [facility]  Chief Complaint:   Chief Complaint    Anxiety     Visit Diagnosis:    ICD-9-CM ICD-10-CM   1. MDD (major depressive disorder), recurrent episode, moderate 296.32 F33.1   2. GAD (generalized anxiety disorder) 300.02 F41.1    Diagnosis:   Patient Active Problem List   Diagnosis Date Noted  . Bicuspid aortic valve [Q23.1] 06/08/2015  . Clinical depression [F32.9] 06/08/2015  . Cannot sleep [G47.00] 06/08/2015  . HTN (hypertension) [I10] 06/08/2015  . Moderate to severe aortic stenosis [I35.0]   . Congenital bicuspid aortic valve [Q23.9]   . Dilated Ascending Aorta [I71.2]   . C. difficile colitis [A04.7]   . Essential hypertension [I10] 12/19/2013  . Anxiety [F41.9] 12/19/2013  . GERD (gastroesophageal reflux disease) [K21.9] 12/19/2013   History of Present Illness:    Pt is a 59 yo married female presented for initial assessment. She reported that she has long history of anxiety and has been prescribed nortriptyline approximately 20 years ago to control her depression and anxiety symptoms. She reported that she also started taking Klonopin milligram twice a day to help with her insomnia and anxiety symptoms. She has been taking nortriptyline 25 mg off and on for many years. She stopped taking the medication during the past year  but due to her insomnia she restarted the medication again with the help of her OB/GYN in the past few months. Patient stated that she has history of cardiac issues and follows with cardiologist on a regular basis. During her last visit she was not taking the nortriptyline and there was no concern about her use of tricyclic antidepressant.   Her OB/GYN was not comfortable prescribing her combination of nortriptyline and Klonopin so they referred her for a psychiatric evaluation. Patient  reported that she feels that the Klonopin dose is too high and she feels very sedated and tired during the daytime. She stated that since she has been disabled and stay home most of the time for the past year she wants to cut down on the Klonopin dose. She has also noticed that her depressive symptoms are getting worse and she needs some other medication. She currently denied having any suicidal ideations or plans. She reported that her husband is very supportive. She has good relationship with him. She currently denied having any use of drugs or alcohol.   Elements:  Location:  Depression. Associated Signs/Symptoms: Depression Symptoms:  depressed mood, fatigue, difficulty concentrating, hopelessness, impaired memory, anxiety, disturbed sleep, increased appetite, decreased appetite, (Hypo) Manic Symptoms:  none Anxiety Symptoms:  fidgety, chest hurts at times.  Psychotic Symptoms:  none PTSD Symptoms: Had a traumatic exposure in the last month:  First husband was cocaine addict and she moved for more than 20 times with him. She stated that she finally left him and bought a house in [place] nd raised her son and daughter and they both live in [place].   Past Medical History:  Past Medical History  Diagnosis Date  . Moderate to severe aortic stenosis     a. 12/2013 Echo: EF 60-65%, Grade 1 DD, bicuspid AoV with mod-sev AS [42mmHg mean gradient, AoV area 0.69cm^2 (VTI)], mod dil ascending Ao - 4.45cm, mild TR, PASP 73mmHg.  . Congenital bicuspid aortic valve   . Dilated Ascending Aorta     a. 12/2013 Echo: 4.45cm.  [name] HTN (hypertension)   .  History of tobacco abuse     a. 20 yrs, 1/4 ppd - quit.  [name] GERD (gastroesophageal reflux disease)   . Depression   . Diverticulitis   . C. difficile colitis     a. 07/2014.  [name] Hypokalemia   . Anxiety     Past Surgical History  Procedure Laterality Date  . Bartholin gland cyst excision    . Breast biopsy Left   . Ovary surgery     Family History:   Family History  Problem Relation Age of Onset  . Hypertension Mother   . Colon cancer Mother   . Anxiety disorder Mother   . Depression Mother   . Heart failure Father   . Hypertension Father   . Heart attack Neg Hx   . Stroke Neg Hx   . Depression Sister   . Anxiety disorder Sister   . Alcohol abuse Brother   . Schizophrenia Brother    Social History:   History   Social History  . Marital Status: Married    Spouse Name: N/A  . Number of Children: N/A  . Years of Education: N/A   Social History Main Topics  . Smoking status: Former Smoker -- 0.25 packs/day for 20 years    Types: Cigarettes    Quit date: 06/07/2005  . Smokeless tobacco: Never Used  . Alcohol Use: 2.4 oz/week    0 Standard drinks or equivalent, 2 Glasses of wine, 2 Cans of beer per week     Comment: occasional  . Drug Use: No  . Sexual Activity: Yes    Birth Control/ Protection: None   Other Topics Concern  . None   Social History Narrative   Additional Social History:  Remarried x 4 years. Disabled due to back issues.   Musculoskeletal: Strength & Muscle Tone: decreased Gait & Station: normal Patient leans: N/A  Psychiatric Specialty Exam: HPI  Review of Systems  Constitutional: Negative for chills.  HENT: Negative for hearing loss and nosebleeds.   Eyes: Negative for double vision.  Respiratory: Negative for sputum production.   Cardiovascular: Negative for palpitations and claudication.  Gastrointestinal: Negative for nausea and diarrhea.  Genitourinary: Negative for urgency.  Neurological: Negative for tremors and focal weakness.  Psychiatric/Behavioral: Positive for depression. Negative for suicidal ideas and substance abuse. The patient is nervous/anxious.     Blood pressure 122/78, pulse 92, temperature 97.1 F (36.2 C), temperature source Tympanic, height 5\' 4"  (1.626 m), SpO2 99 %.There is no weight on file to calculate BMI.  General Appearance: Casual  Eye Contact:  Fair   Speech:  Normal Rate  Volume:  Normal  Mood:  Anxious  Affect:  Congruent  Thought Process:  Coherent  Orientation:  Full (Time, Place, and Person)  Thought Content:  WDL  Suicidal Thoughts:  No  Homicidal Thoughts:  No  Memory:  Immediate;   Fair  Judgement:  Fair  Insight:  Fair  Psychomotor Activity:  Normal  Concentration:  Fair  Recall:  [employer] of Knowledge:Fair  Language: Fair  Akathisia:  No  Handed:  Right  AIMS (if indicated):  none  Assets:  Communication Skills Desire for Improvement Social Support Talents/Skills  ADL's:  Intact  Cognition: WNL  Sleep:  6-7   Is the patient at risk to self?  No. Has the patient been a risk to self in the past 6 months?  No. Has the patient been a risk to self within the distant past?  No. Is the  patient a risk to others?  No. Has the patient been a risk to others in the past 6 months?  No. Has the patient been a risk to others within the distant past?  No.  Allergies:   Allergies  Allergen Reactions  . Vicodin [Hydrocodone-Acetaminophen] Hives and Rash   Current Medications: Current Outpatient Prescriptions  Medication Sig Dispense Refill  . clonazePAM (KLONOPIN) 1 MG tablet Take 1 mg by mouth 2 (two) times daily.    [name] ibuprofen (ADVIL,MOTRIN) 800 MG tablet Take 800 mg by mouth 3 (three) times daily.  0  . lisinopril-hydrochlorothiazide (PRINZIDE,ZESTORETIC) 20-25 MG per tablet Take 1 tablet by mouth daily.    . metoprolol succinate (TOPROL-XL) 25 MG 24 hr tablet Take 12.5 mg by mouth daily.    . nortriptyline (PAMELOR) 25 MG capsule Take 25 mg by mouth at bedtime.  0  . omeprazole (PRILOSEC) 20 MG capsule Take 20 mg by mouth daily.    [name] HYDROcodone-acetaminophen (NORCO/VICODIN) 5-325 MG per tablet take 1 to 2 tablets by mouth every 6 hours if needed  0   No current facility-administered medications for this visit.    Previous Psychotropic Medications: Paxil, Nortriptyline.  Substance Abuse History in the last  12 months:  Yes.    Glass of wine 1-2 times per week.   Consequences of Substance Abuse: NA  Medical Decision Making:  Established Problem, Stable/Improving (1), Review and summation of old records (2) and Review of Medication Regimen & Side Effects (2)  Treatment Plan Summary: Medication management  Discussed with patient about the medications and will discontinue the nortriptyline as it is not advisable to use due to her cardiac issues and she agreed with the plan I will start her on Lexapro 5 mg daily for 1 week and then titrate the dose to 10 mg daily She will take Klonopin 1 mg daily at bedtime for insomnia. Patient has supply of the medication She will follow-up in one month or earlier depending on her symptoms     Rainey Pines 6/21/201610:43 AM

## 2015-06-09 ENCOUNTER — Telehealth: Payer: Self-pay | Admitting: *Deleted

## 2015-06-09 ENCOUNTER — Telehealth: Payer: Self-pay | Admitting: Psychiatry

## 2015-06-09 NOTE — Telephone Encounter (Signed)
Spoke w/ pt.  Advised her of Dr. Donivan Scull recommendation.  She verbalizes understanding, though she states that her PCP & psychiatrist are wanting to switch her to Lexapro.  She has been on nortriptyline for 20+ years and they advised her not to take it anymore.  Advised her of Dr. Donivan Scull recommendation and asked to have her other docs speak w/ him if they have any questions.

## 2015-06-09 NOTE — Telephone Encounter (Signed)
Pt c/o medication issue:  1. Name of Medication: nortriptyline  2. How are you currently taking this medication (dosage and times per day)? Not taking yet.   3. Are you having a reaction (difficulty breathing--STAT)? no  4. What is your medication issue?  Wants to know if she can take it. For it may interfere with heart medications.

## 2015-06-09 NOTE — Telephone Encounter (Signed)
Should not interfere with cardiac medications

## 2015-06-14 ENCOUNTER — Telehealth: Payer: Self-pay | Admitting: Psychiatry

## 2015-07-08 ENCOUNTER — Ambulatory Visit (INDEPENDENT_AMBULATORY_CARE_PROVIDER_SITE_OTHER): Payer: BLUE CROSS/BLUE SHIELD | Admitting: Psychiatry

## 2015-07-08 ENCOUNTER — Encounter: Payer: Self-pay | Admitting: Psychiatry

## 2015-07-08 VITALS — BP 122/88 | HR 85 | Temp 98.0°F | Ht 64.0 in | Wt 131.6 lb

## 2015-07-08 DIAGNOSIS — F331 Major depressive disorder, recurrent, moderate: Secondary | ICD-10-CM

## 2015-07-08 MED ORDER — CLONAZEPAM 0.5 MG PO TABS: 0.5000 mg | ORAL_TABLET | Freq: Two times a day (BID) | ORAL | Status: DC

## 2015-07-08 NOTE — Progress Notes (Signed)
[facility] MD/PA/NP OP Progress Note  07/08/2015 10:42 AM Deborah Koch  MRN:  [id number]  Subjective:    Pt is a 59 yo married female presented for follow up.  She reported that she fill the prescription of Lexapro after her last appointment but she did not start taking the medication as she read about all the side effects of the medication. Patient reported that she started taking Klonopin 1 mg twice daily and wants to continue the same medication. She also called her cardiologist about the adverse effects of the nortriptyline 25 mg. Patient reported that her anxiety has not improved. She is trying to do some exercises and is also planning to start several sneakers. She appeared anxious during the interview.      Chief Complaint:  Chief Complaint    Follow-up; Medication Refill     Visit Diagnosis:     ICD-9-CM ICD-10-CM   1. MDD (major depressive disorder), recurrent episode, moderate 296.32 F33.1     Past Medical History:  Past Medical History  Diagnosis Date  . Moderate to severe aortic stenosis     a. 12/2013 Echo: EF 60-65%, Grade 1 DD, bicuspid AoV with mod-sev AS [49mmHg mean gradient, AoV area 0.69cm^2 (VTI)], mod dil ascending Ao - 4.45cm, mild TR, PASP 66mmHg.  . Congenital bicuspid aortic valve   . Dilated Ascending Aorta     a. 12/2013 Echo: 4.45cm.  [name] HTN (hypertension)   . History of tobacco abuse     a. 20 yrs, 1/4 ppd - quit.  [name] GERD (gastroesophageal reflux disease)   . Depression   . Diverticulitis   . C. difficile colitis     a. 07/2014.  [name] Hypokalemia   . Anxiety     Past Surgical History  Procedure Laterality Date  . Bartholin gland cyst excision    . Breast biopsy Left   . Ovary surgery     Family History:  Family History  Problem Relation Age of Onset  . Hypertension Mother   . Colon cancer Mother   . Anxiety disorder Mother   . Depression Mother   . Heart failure Father   . Hypertension Father   . Heart attack Neg Hx   . Stroke Neg Hx   .  Depression Sister   . Anxiety disorder Sister   . Alcohol abuse Brother   . Schizophrenia Brother    Social History:  History   Social History  . Marital Status: Married    Spouse Name: N/A  . Number of Children: N/A  . Years of Education: N/A   Social History Main Topics  . Smoking status: Former Smoker -- 0.25 packs/day for 20 years    Types: Cigarettes    Quit date: 06/07/2005  . Smokeless tobacco: Never Used  . Alcohol Use: 2.4 oz/week    0 Standard drinks or equivalent, 2 Glasses of wine, 2 Cans of beer per week     Comment: occasional  . Drug Use: No  . Sexual Activity: Yes    Birth Control/ Protection: None   Other Topics Concern  . None   Social History Narrative   Additional History:   Assessment:   Musculoskeletal: Strength & Muscle Tone: within normal limits Gait & Station: normal Patient leans: N/A  Psychiatric Specialty Exam: HPI  Review of Systems  Musculoskeletal: Positive for back pain.  Psychiatric/Behavioral: Positive for depression. The patient is nervous/anxious.   All other systems reviewed and are negative.   Blood pressure 122/88, pulse  85, temperature 98 F (36.7 C), temperature source Tympanic, height 5\' 4"  (1.626 m), weight 131 lb 9.6 oz (59.693 kg), SpO2 97 %.Body mass index is 22.58 kg/(m^2).  General Appearance: Casual  Eye Contact:  Fair  Speech:  verbose  Volume:  Normal  Mood:  Anxious  Affect:  Congruent  Thought Process:  Coherent  Orientation:  Full (Time, Place, and Person)  Thought Content:  WDL  Suicidal Thoughts:  No  Homicidal Thoughts:  No  Memory:  NA  Judgement:  Fair  Insight:  Fair  Psychomotor Activity:  Normal  Concentration:  Fair  Recall:  [employer] of Knowledge: Fair  Language: Fair  Akathisia:  No  Handed:  Right  AIMS (if indicated):    Assets:  Communication Skills Desire for Improvement Social Support  ADL's:  Intact  Cognition: WNL  Sleep:     Is the patient at risk to self?  No. Has  the patient been a risk to self in the past 6 months?  No. Has the patient been a risk to self within the distant past?  No. Is the patient a risk to others?  No. Has the patient been a risk to others in the past 6 months?  No. Has the patient been a risk to others within the distant past?  No.  Current Medications: Current Outpatient Prescriptions  Medication Sig Dispense Refill  . clonazePAM (KLONOPIN) 1 MG tablet Take 1 mg by mouth 2 (two) times daily.    [name] ibuprofen (ADVIL,MOTRIN) 800 MG tablet Take 800 mg by mouth 3 (three) times daily.  0  . lisinopril-hydrochlorothiazide (PRINZIDE,ZESTORETIC) 20-25 MG per tablet Take 1 tablet by mouth daily.    . metoprolol succinate (TOPROL-XL) 25 MG 24 hr tablet Take 12.5 mg by mouth daily.    [name] omeprazole (PRILOSEC) 20 MG capsule Take 20 mg by mouth daily.     No current facility-administered medications for this visit.    Medical Decision Making:  Review of Psycho-Social Stressors (1) and Established Problem, Worsening (2)  Treatment Plan Summary:Medication management   I had a long discussion with the patient about the Lexapro and is adverse effects which are listed on the medication and she agreed with the plan that she will start taking the Lexapro again. She has a prescription of Lexapro which was prescribed at last time  I will decrease the dose of Klonopin to 0.5 mg and patient agreed with the plan. She will take it on a twice-daily basis.  Patient will follow up in 1 month or earlier depending on her symptoms.   More than 50% of the time spent in psychoeducation, counseling and coordination of care.    This note was generated in part or whole with voice recognition software. Voice regonition is usually quite accurate but there are transcription errors that can and very often do occur. I apologize for any typographical errors that were not detected and corrected.    Rainey Pines 07/08/2015, 10:42 AM

## 2015-07-23 ENCOUNTER — Other Ambulatory Visit: Payer: Self-pay

## 2015-07-23 ENCOUNTER — Telehealth: Payer: Self-pay

## 2015-07-23 NOTE — Telephone Encounter (Signed)
pt called states that on the last visit on 07-08-15 you gave her an rx for lexapro. pt states that the 1st week she took half a tablet and she had headaches.  she states the headaches are better but this week she started the whole tablet and he is having mild side affects. Pt is having some nausea and  Diarrhea in the am. Pt wants to know if should continue with the medications

## 2015-07-23 NOTE — Telephone Encounter (Signed)
pt called states that on the last visit on 07-08-15 you gave her an rx for lexapro. pt states that the 1st week she took half a tablet and she had headaches.  she states the headaches are better but this week she started the whole tablet and he is having mild side effects.  She having nausea and diarrhea in the am only.  Pt wants to know if she should continue with medications please advise.  Phone # [phone]

## 2015-07-27 NOTE — Telephone Encounter (Signed)
Please advise pt to continue with medication at this time. She can take Lexapro 5mg  for another week to minimize the adverse effects.

## 2015-07-27 NOTE — Telephone Encounter (Signed)
called pt. pt states she doing much better after changing her eating of watermelon in the am and changing her reflux medication. pt states she she doing much better.

## 2015-08-03 ENCOUNTER — Ambulatory Visit (INDEPENDENT_AMBULATORY_CARE_PROVIDER_SITE_OTHER): Payer: BLUE CROSS/BLUE SHIELD | Admitting: Psychiatry

## 2015-08-03 ENCOUNTER — Encounter: Payer: Self-pay | Admitting: Psychiatry

## 2015-08-03 VITALS — BP 120/78 | HR 73 | Temp 97.6°F | Ht 64.0 in | Wt 131.4 lb

## 2015-08-03 DIAGNOSIS — F331 Major depressive disorder, recurrent, moderate: Secondary | ICD-10-CM

## 2015-08-03 MED ORDER — CLONAZEPAM 0.5 MG PO TABS: 0.5000 mg | ORAL_TABLET | Freq: Two times a day (BID) | ORAL | Status: DC

## 2015-08-03 MED ORDER — ESCITALOPRAM OXALATE 10 MG PO TABS: 10.0000 mg | ORAL_TABLET | Freq: Every day | ORAL | Status: DC

## 2015-08-03 MED ORDER — RANITIDINE HCL 150 MG PO TABS: 150.0000 mg | ORAL_TABLET | Freq: Every morning | ORAL | Status: DC

## 2015-08-03 NOTE — Progress Notes (Signed)
BH MD/PA/NP OP Progress Note  08/03/2015 10:42 AM Timica Marcom  MRN:  [id number]  Subjective:    Pt is a 59 yo married female presented for follow up.  She reported that she  has been doing well on the Lexapro and has started taking the medication as prescribed. She reported that she also takes Klonopin on a daily basis and reported that she has understood the prescriptions and is noticing improvement on her current medications. She is also compliant with the Klonopin and the Lexapro and reported that she will start doing something when  she will feel that her anxiety is kicking in. She reported that her husband has also noticed improvement in her anxiety symptoms. The  patient currently denied having any suicidal ideations or plans she reported that her husband is very supportive and he works in the mail center in [employer] and they are going to celebrate their fourth  anniversary over  this weekend. She appeared calm during the interview and was very thankful for her medications.    Chief Complaint:  Chief Complaint    Follow-up; Medication Refill; Anxiety     Visit Diagnosis:   No diagnosis found.  Past Medical History:  Past Medical History  Diagnosis Date  . Moderate to severe aortic stenosis     a. 12/2013 Echo: EF 60-65%, Grade 1 DD, bicuspid AoV with mod-sev AS [56mmHg mean gradient, AoV area 0.69cm^2 (VTI)], mod dil ascending Ao - 4.45cm, mild TR, PASP 62mmHg.  . Congenital bicuspid aortic valve   . Dilated Ascending Aorta     a. 12/2013 Echo: 4.45cm.  [name] HTN (hypertension)   . History of tobacco abuse     a. 20 yrs, 1/4 ppd - quit.  [name] GERD (gastroesophageal reflux disease)   . Depression   . Diverticulitis   . C. difficile colitis     a. 07/2014.  [name] Hypokalemia   . Anxiety     Past Surgical History  Procedure Laterality Date  . Bartholin gland cyst excision    . Breast biopsy Left   . Ovary surgery     Family History:  Family History  Problem Relation  Age of Onset  . Hypertension Mother   . Colon cancer Mother   . Anxiety disorder Mother   . Depression Mother   . Heart failure Father   . Hypertension Father   . Heart attack Neg Hx   . Stroke Neg Hx   . Depression Sister   . Anxiety disorder Sister   . Alcohol abuse Brother   . Schizophrenia Brother    Social History:  Social History   Social History  . Marital Status: Married    Spouse Name: N/A  . Number of Children: N/A  . Years of Education: N/A   Social History Main Topics  . Smoking status: Former Smoker -- 0.25 packs/day for 20 years    Types: Cigarettes    Quit date: 06/07/2005  . Smokeless tobacco: Never Used  . Alcohol Use: 2.4 oz/week    0 Standard drinks or equivalent, 2 Glasses of wine, 2 Cans of beer per week     Comment: occasional  . Drug Use: No  . Sexual Activity: Yes    Birth Control/ Protection: None   Other Topics Concern  . None   Social History Narrative   Additional History:   Patient currently lives with her husband and they have been married for the past 4 years   Assessment:  Musculoskeletal: Strength & Muscle Tone: within normal limits Gait & Station: normal Patient leans: N/A  Psychiatric Specialty Exam: Anxiety Symptoms include nervous/anxious behavior.      Review of Systems  Musculoskeletal: Positive for back pain and joint pain.  Psychiatric/Behavioral: Positive for depression. The patient is nervous/anxious.   All other systems reviewed and are negative.   Blood pressure 120/78, pulse 73, temperature 97.6 F (36.4 C), temperature source Tympanic, height 5\' 4"  (1.626 m), weight 131 lb 6.4 oz (59.603 kg), SpO2 96 %.Body mass index is 22.54 kg/(m^2).  General Appearance: Casual  Eye Contact:  Fair  Speech:  verbose  Volume:  Normal  Mood:  Anxious  Affect:  Congruent  Thought Process:  Coherent  Orientation:  Full (Time, Place, and Person)  Thought Content:  WDL  Suicidal Thoughts:  No  Homicidal Thoughts:   No  Memory:  NA  Judgement:  Fair  Insight:  Fair  Psychomotor Activity:  Normal  Concentration:  Fair  Recall:  [employer] of Knowledge: Fair  Language: Fair  Akathisia:  No  Handed:  Right  AIMS (if indicated):    Assets:  Communication Skills Desire for Improvement Social Support  ADL's:  Intact  Cognition: WNL  Sleep:     Is the patient at risk to self?  No. Has the patient been a risk to self in the past 6 months?  No. Has the patient been a risk to self within the distant past?  No. Is the patient a risk to others?  No. Has the patient been a risk to others in the past 6 months?  No. Has the patient been a risk to others within the distant past?  No.  Current Medications: Current Outpatient Prescriptions  Medication Sig Dispense Refill  . clonazePAM (KLONOPIN) 0.5 MG tablet Take 0.5 mg by mouth 2 (two) times daily.  0  . escitalopram (LEXAPRO) 10 MG tablet Take 10 mg by mouth daily.    [name] lisinopril-hydrochlorothiazide (PRINZIDE,ZESTORETIC) 20-25 MG per tablet Take 1 tablet by mouth daily.    . metoprolol succinate (TOPROL-XL) 25 MG 24 hr tablet Take 12.5 mg by mouth daily.    . ranitidine (ZANTAC) 150 MG tablet Take 150 mg by mouth 2 (two) times daily.     No current facility-administered medications for this visit.    Medical Decision Making:  Review of Psycho-Social Stressors (1) and Established Problem, Worsening (2)  Treatment Plan Summary:Medication management   She will continue on Lexapro 10 mg daily as well as on Klonopin 0.5 mg by mouth twice a day She was also given a prescription for ranitidine 150 mg in the morning as it has been helpful with her GERD symptoms   Patient will follow up in 1 month or earlier depending on her symptoms.   More than 50% of the time spent in psychoeducation, counseling and coordination of care.    This note was generated in part or whole with voice recognition software. Voice regonition is usually quite accurate but there  are transcription errors that can and very often do occur. I apologize for any typographical errors that were not detected and corrected.    Rainey Pines 08/03/2015, 10:42 AM

## 2015-08-27 ENCOUNTER — Telehealth: Payer: Self-pay

## 2015-08-27 NOTE — Telephone Encounter (Signed)
pt called wanted to find out if dr. Gretel Acre would give a rx for perocet. pt states she on disablility because of depression and back pain. pt was told that dr. Gretel Acre would not be back into the office until tuesday and that i would send a message but dr. Gretel Acre would most likely say no and refer to pain clinic. Pt was advised to go to er or an urgent care facility.

## 2015-09-07 ENCOUNTER — Encounter: Payer: Self-pay | Admitting: Psychiatry

## 2015-09-07 ENCOUNTER — Ambulatory Visit (INDEPENDENT_AMBULATORY_CARE_PROVIDER_SITE_OTHER): Payer: BLUE CROSS/BLUE SHIELD | Admitting: Psychiatry

## 2015-09-07 VITALS — BP 100/70 | HR 97 | Temp 97.3°F | Ht 63.5 in | Wt 126.6 lb

## 2015-09-07 DIAGNOSIS — F330 Major depressive disorder, recurrent, mild: Secondary | ICD-10-CM

## 2015-09-07 MED ORDER — ESCITALOPRAM OXALATE 10 MG PO TABS: 10.0000 mg | ORAL_TABLET | Freq: Every day | ORAL | Status: DC

## 2015-09-07 MED ORDER — CLONAZEPAM 0.5 MG PO TABS: 0.5000 mg | ORAL_TABLET | Freq: Two times a day (BID) | ORAL | Status: DC

## 2015-09-07 NOTE — Progress Notes (Signed)
BH MD/PA/NP OP Progress Note  09/07/2015 11:35 AM Deborah Koch  MRN:  [id number]  Subjective:    Pt is a 59 yo married female presented for follow up.  She reported that she  has been doing well on the Lexapro and has started taking the medication as prescribed. She reported that she also takes Klonopin for anxiety and reported that she continues to have diarrhea in the morning. Patient reported that she has noticed improvement in her anxiety symptoms. Patient reported that she has been sleeping well at night. However she is unhappy in the [place] area as she relocated to [place] 3 years ago when she remarried. Patient reported that all her friends are still in the [place] area and she wants to go back to [place]  as her children and grandchildren still lives there. Patient reported that she does not have any connection here in [place] and she feels very isolated. She currently denied having any mood swings anger at this time. Patient  became tearful that she started thinking about her old friends. She stated that she wants to go to the [place] over the weekend with her husband but then he becomes tired as he has been working all week long at [employer]. She reported that she wants to meet up with her friends during the weekdays.    Chief Complaint:  Chief Complaint    Follow-up; Medication Refill     Visit Diagnosis:     ICD-9-CM ICD-10-CM   1. MDD (major depressive disorder), recurrent episode, mild 296.31 F33.0     Past Medical History:  Past Medical History  Diagnosis Date  . Moderate to severe aortic stenosis     a. 12/2013 Echo: EF 60-65%, Grade 1 DD, bicuspid AoV with mod-sev AS [57mmHg mean gradient, AoV area 0.69cm^2 (VTI)], mod dil ascending Ao - 4.45cm, mild TR, PASP 9mmHg.  . Congenital bicuspid aortic valve   . Dilated Ascending Aorta     a. 12/2013 Echo: 4.45cm.  [name] HTN (hypertension)   . History of tobacco abuse     a. 20 yrs, 1/4 ppd - quit.  [name] GERD  (gastroesophageal reflux disease)   . Depression   . Diverticulitis   . C. difficile colitis     a. 07/2014.  [name] Hypokalemia   . Anxiety     Past Surgical History  Procedure Laterality Date  . Bartholin gland cyst excision    . Breast biopsy Left   . Ovary surgery     Family History:  Family History  Problem Relation Age of Onset  . Hypertension Mother   . Colon cancer Mother   . Anxiety disorder Mother   . Depression Mother   . Heart failure Father   . Hypertension Father   . Heart attack Neg Hx   . Stroke Neg Hx   . Depression Sister   . Anxiety disorder Sister   . Alcohol abuse Brother   . Schizophrenia Brother    Social History:  Social History   Social History  . Marital Status: Married    Spouse Name: N/A  . Number of Children: N/A  . Years of Education: N/A   Social History Main Topics  . Smoking status: Former Smoker -- 0.25 packs/day for 20 years    Types: Cigarettes    Quit date: 06/07/2005  . Smokeless tobacco: Never Used  . Alcohol Use: 2.4 oz/week    0 Standard drinks or equivalent, 2 Glasses of wine, 2 Cans of beer  per week     Comment: occasional  . Drug Use: No  . Sexual Activity: Yes    Birth Control/ Protection: None   Other Topics Concern  . None   Social History Narrative   Additional History:   Patient currently lives with her husband and they have been married for the past 4 years   Assessment:   Musculoskeletal: Strength & Muscle Tone: within normal limits Gait & Station: normal Patient leans: N/A  Psychiatric Specialty Exam: Anxiety Symptoms include nervous/anxious behavior.      Review of Systems  Musculoskeletal: Positive for back pain and joint pain.  Psychiatric/Behavioral: Positive for depression. The patient is nervous/anxious.   All other systems reviewed and are negative.   Blood pressure 100/70, pulse 97, temperature 97.3 F (36.3 C), temperature source Tympanic, height 5' 3.5" (1.613 m), weight 126 lb 9.6  oz (57.425 kg), SpO2 96 %.Body mass index is 22.07 kg/(m^2).  General Appearance: Casual  Eye Contact:  Fair  Speech:  verbose  Volume:  Normal  Mood:  Anxious  Affect:  Congruent  Thought Process:  Coherent  Orientation:  Full (Time, Place, and Person)  Thought Content:  WDL  Suicidal Thoughts:  No  Homicidal Thoughts:  No  Memory:  NA  Judgement:  Fair  Insight:  Fair  Psychomotor Activity:  Normal  Concentration:  Fair  Recall:  [employer] of Knowledge: Fair  Language: Fair  Akathisia:  No  Handed:  Right  AIMS (if indicated):    Assets:  Communication Skills Desire for Improvement Social Support  ADL's:  Intact  Cognition: WNL  Sleep:     Is the patient at risk to self?  No. Has the patient been a risk to self in the past 6 months?  No. Has the patient been a risk to self within the distant past?  No. Is the patient a risk to others?  No. Has the patient been a risk to others in the past 6 months?  No. Has the patient been a risk to others within the distant past?  No.  Current Medications: Current Outpatient Prescriptions  Medication Sig Dispense Refill  . clonazePAM (KLONOPIN) 0.5 MG tablet Take 1 tablet (0.5 mg total) by mouth 2 (two) times daily. 30 tablet 2  . escitalopram (LEXAPRO) 10 MG tablet Take 1 tablet (10 mg total) by mouth daily. 30 tablet 1  . lisinopril-hydrochlorothiazide (PRINZIDE,ZESTORETIC) 20-25 MG per tablet Take 1 tablet by mouth daily.    . metoprolol succinate (TOPROL-XL) 25 MG 24 hr tablet Take 12.5 mg by mouth daily.    . ranitidine (ZANTAC) 150 MG tablet Take 1 tablet (150 mg total) by mouth every morning. 30 tablet 2   No current facility-administered medications for this visit.    Medical Decision Making:  Review of Psycho-Social Stressors (1) and Established Problem, Worsening (2)  Treatment Plan Summary:Medication management   Depression  She will continue on Lexapro 10 mg daily   Anxiety  Klonopin 0.5 mg by mouth twice a  day      Patient will follow up in 2 month or earlier depending on her symptoms.   More than 50% of the time spent in psychoeducation, counseling and coordination of care.  Time spent with the patient 25 minutes   This note was generated in part or whole with voice recognition software. Voice regonition is usually quite accurate but there are transcription errors that can and very often do occur. I apologize for any typographical  errors that were not detected and corrected.    Rainey Pines 09/07/2015, 11:35 AM

## 2015-09-24 ENCOUNTER — Telehealth: Payer: Self-pay

## 2015-09-24 NOTE — Telephone Encounter (Signed)
pt called sent to wrong doctor by mistake

## 2015-09-24 NOTE — Telephone Encounter (Signed)
pt called she states she has been taking the lexapro for about 2 .5 months now and she said that she taking 10mg  but she wants to know if she could cut it back to 5mg  and see if she feels better.  pt states that she still has diarrhea and when she first wakes up she feel like she is going to be sick she feel like she has the flu for the first couple hours she wakes up.

## 2015-10-25 ENCOUNTER — Other Ambulatory Visit: Payer: Self-pay | Admitting: Psychiatry

## 2015-10-26 ENCOUNTER — Other Ambulatory Visit: Payer: Self-pay | Admitting: Psychiatry

## 2015-10-29 ENCOUNTER — Ambulatory Visit: Payer: BLUE CROSS/BLUE SHIELD

## 2015-10-29 ENCOUNTER — Other Ambulatory Visit: Payer: Self-pay

## 2015-10-29 DIAGNOSIS — Q231 Congenital insufficiency of aortic valve: Secondary | ICD-10-CM

## 2015-10-29 DIAGNOSIS — I77810 Thoracic aortic ectasia: Secondary | ICD-10-CM

## 2015-10-29 DIAGNOSIS — I10 Essential (primary) hypertension: Secondary | ICD-10-CM

## 2015-11-02 ENCOUNTER — Telehealth: Payer: Self-pay

## 2015-11-02 ENCOUNTER — Ambulatory Visit (INDEPENDENT_AMBULATORY_CARE_PROVIDER_SITE_OTHER): Payer: BLUE CROSS/BLUE SHIELD | Admitting: Psychiatry

## 2015-11-02 ENCOUNTER — Encounter: Payer: Self-pay | Admitting: Psychiatry

## 2015-11-02 VITALS — BP 118/76 | HR 90 | Temp 97.6°F | Ht 63.5 in | Wt 128.6 lb

## 2015-11-02 DIAGNOSIS — F330 Major depressive disorder, recurrent, mild: Secondary | ICD-10-CM

## 2015-11-02 MED ORDER — CLONAZEPAM 0.5 MG PO TABS: 0.5000 mg | ORAL_TABLET | Freq: Two times a day (BID) | ORAL | Status: DC

## 2015-11-02 MED ORDER — RANITIDINE HCL 150 MG PO TABS: 150.0000 mg | ORAL_TABLET | Freq: Every morning | ORAL | Status: DC

## 2015-11-02 MED ORDER — ESCITALOPRAM OXALATE 10 MG PO TABS: 10.0000 mg | ORAL_TABLET | Freq: Every day | ORAL | Status: DC

## 2015-11-02 NOTE — Progress Notes (Signed)
BH MD/PA/NP OP Progress Note  11/02/2015 10:40 AM Deborah Koch  MRN:  [id number]  Subjective:    Pt is a 59 yo married female presented for follow up.  She reported that she  has been doing well as she has been taking her medications as prescribed. She reported that she takes Klonopin on a daily basis. Patient has started going to [place]  twice per month and she feels very much relaxed  as she has been meeting her old friends and doing lunch over there. Patient reported that she feels happy as she does not have to take her husband over the weekend. She reported that she has now improved relationship with her husband as well. She stated that now she does not have any stress during the daytime. Patient is looking forward to the spending holidays with her family members. She denied having any adverse effects of medications. She denied having any suicidal homicidal ideations or plans. She appeared alert and calm during the interview.    Chief Complaint:  Chief Complaint    Medication Refill; Follow-up     Visit Diagnosis:     ICD-9-CM ICD-10-CM   1. MDD (major depressive disorder), recurrent episode, mild (HCC) 296.31 F33.0     Past Medical History:  Past Medical History  Diagnosis Date  . Moderate to severe aortic stenosis     a. 12/2013 Echo: EF 60-65%, Grade 1 DD, bicuspid AoV with mod-sev AS [44mmHg mean gradient, AoV area 0.69cm^2 (VTI)], mod dil ascending Ao - 4.45cm, mild TR, PASP 29mmHg.  . Congenital bicuspid aortic valve   . Dilated Ascending Aorta     a. 12/2013 Echo: 4.45cm.  [name] HTN (hypertension)   . History of tobacco abuse     a. 20 yrs, 1/4 ppd - quit.  [name] GERD (gastroesophageal reflux disease)   . Depression   . Diverticulitis   . C. difficile colitis     a. 07/2014.  [name] Hypokalemia   . Anxiety     Past Surgical History  Procedure Laterality Date  . Bartholin gland cyst excision    . Breast biopsy Left   . Ovary surgery     Family History:  Family History   Problem Relation Age of Onset  . Hypertension Mother   . Colon cancer Mother   . Anxiety disorder Mother   . Depression Mother   . Heart failure Father   . Hypertension Father   . Heart attack Neg Hx   . Stroke Neg Hx   . Depression Sister   . Anxiety disorder Sister   . Alcohol abuse Brother   . Schizophrenia Brother    Social History:  Social History   Social History  . Marital Status: Married    Spouse Name: N/A  . Number of Children: N/A  . Years of Education: N/A   Social History Main Topics  . Smoking status: Former Smoker -- 0.25 packs/day for 20 years    Types: Cigarettes    Quit date: 06/07/2005  . Smokeless tobacco: Never Used  . Alcohol Use: 2.4 oz/week    2 Glasses of wine, 2 Cans of beer, 0 Standard drinks or equivalent, 0 Shots of liquor per week     Comment: occasional  . Drug Use: No  . Sexual Activity: Yes    Birth Control/ Protection: None   Other Topics Concern  . None   Social History Narrative   Additional History:   Patient currently lives with her husband and  they have been married for the past 4 years   Assessment:   Musculoskeletal: Strength & Muscle Tone: within normal limits Gait & Station: normal Patient leans: N/A  Psychiatric Specialty Exam: Anxiety Symptoms include nervous/anxious behavior.      Review of Systems  Musculoskeletal: Positive for back pain and joint pain.  Psychiatric/Behavioral: Positive for depression. The patient is nervous/anxious.   All other systems reviewed and are negative.   Blood pressure 118/76, pulse 90, temperature 97.6 F (36.4 C), temperature source Tympanic, height 5' 3.5" (1.613 m), weight 128 lb 9.6 oz (58.333 kg), SpO2 99 %.Body mass index is 22.42 kg/(m^2).  General Appearance: Casual  Eye Contact:  Fair  Speech:  Clear and Coherent  Volume:  Normal  Mood:  Euthymic  Affect:  Congruent  Thought Process:  Coherent  Orientation:  Full (Time, Place, and Person)  Thought Content:   WDL  Suicidal Thoughts:  No  Homicidal Thoughts:  No  Memory:  NA  Judgement:  Fair  Insight:  Fair  Psychomotor Activity:  Normal  Concentration:  Fair  Recall:  [employer] of Knowledge: Fair  Language: Fair  Akathisia:  No  Handed:  Right  AIMS (if indicated):    Assets:  Communication Skills Desire for Improvement Social Support  ADL's:  Intact  Cognition: WNL  Sleep:     Is the patient at risk to self?  No. Has the patient been a risk to self in the past 6 months?  No. Has the patient been a risk to self within the distant past?  No. Is the patient a risk to others?  No. Has the patient been a risk to others in the past 6 months?  No. Has the patient been a risk to others within the distant past?  No.  Current Medications: Current Outpatient Prescriptions  Medication Sig Dispense Refill  . clonazePAM (KLONOPIN) 0.5 MG tablet Take 1 tablet (0.5 mg total) by mouth 2 (two) times daily. 30 tablet 2  . escitalopram (LEXAPRO) 10 MG tablet Take 1 tablet (10 mg total) by mouth daily. 30 tablet 1  . lisinopril-hydrochlorothiazide (PRINZIDE,ZESTORETIC) 20-25 MG per tablet Take 1 tablet by mouth daily.    . metoprolol succinate (TOPROL-XL) 25 MG 24 hr tablet Take 12.5 mg by mouth daily.    . ranitidine (ZANTAC) 150 MG tablet Take 1 tablet (150 mg total) by mouth every morning. 30 tablet 2   No current facility-administered medications for this visit.    Medical Decision Making:  Review of Psycho-Social Stressors (1) and Established Problem, Worsening (2)  Treatment Plan Summary:Medication management   Depression  She will continue on Lexapro 10 mg daily   Anxiety  Klonopin 0.5 mg by mouth twice a day      Patient will follow up in 2 month or earlier depending on her symptoms.   More than 50% of the time spent in psychoeducation, counseling and coordination of care.  Time spent with the patient 25 minutes   This note was generated in part or whole with voice  recognition software. Voice regonition is usually quite accurate but there are transcription errors that can and very often do occur. I apologize for any typographical errors that were not detected and corrected.    Rainey Pines 11/02/2015, 10:40 AM

## 2015-11-02 NOTE — Telephone Encounter (Signed)
Pt would like echo results. Please call. 

## 2015-12-14 ENCOUNTER — Other Ambulatory Visit: Payer: Self-pay | Admitting: Psychiatry

## 2015-12-21 ENCOUNTER — Encounter: Payer: Self-pay | Admitting: Obstetrics and Gynecology

## 2015-12-21 ENCOUNTER — Ambulatory Visit (INDEPENDENT_AMBULATORY_CARE_PROVIDER_SITE_OTHER): Payer: BLUE CROSS/BLUE SHIELD | Admitting: Obstetrics and Gynecology

## 2015-12-21 VITALS — BP 132/77 | HR 71 | Temp 98.2°F | Ht 64.0 in | Wt 127.5 lb

## 2015-12-21 DIAGNOSIS — Z90722 Acquired absence of ovaries, bilateral: Secondary | ICD-10-CM

## 2015-12-21 DIAGNOSIS — Z9079 Acquired absence of other genital organ(s): Secondary | ICD-10-CM

## 2015-12-21 DIAGNOSIS — R350 Frequency of micturition: Secondary | ICD-10-CM

## 2015-12-21 DIAGNOSIS — R309 Painful micturition, unspecified: Secondary | ICD-10-CM | POA: Diagnosis not present

## 2015-12-21 DIAGNOSIS — Z8619 Personal history of other infectious and parasitic diseases: Secondary | ICD-10-CM

## 2015-12-21 LAB — POCT URINALYSIS DIPSTICK
Bilirubin, UA: NEGATIVE
Blood, UA: NEGATIVE
Glucose, UA: NEGATIVE
Ketones, UA: NEGATIVE
Leukocytes, UA: NEGATIVE
Nitrite, UA: NEGATIVE
Protein, UA: NEGATIVE
Spec Grav, UA: <=1.005
Urobilinogen, UA: 0.2
pH, UA: 7.5

## 2015-12-21 NOTE — Patient Instructions (Signed)
1.  Urinalysis is normal.Urine culture is sent and results will be available in 72 hours. 2.  Encourage water intake and cranberry juice intake for possible UTI. 3.  We will notify you regarding results as soon as they are available.

## 2015-12-21 NOTE — Progress Notes (Signed)
GYN ENCOUNTER NOTE  Subjective:       Deborah Koch is a 60 y.o. No obstetric history on file. female is here for gynecologic evaluation of the following issues:  1. Urinary frequency. 2.  Dysuria.     Gynecologic History No LMP recorded. Patient is postmenopausal. Contraception: menopause   Obstetric History OB History  No data available    Past Medical History  Diagnosis Date  . Moderate to severe aortic stenosis     a. 12/2013 Echo: EF 60-65%, Grade 1 DD, bicuspid AoV with mod-sev AS [28mmHg mean gradient, AoV area 0.69cm^2 (VTI)], mod dil ascending Ao - 4.45cm, mild TR, PASP 70mmHg.  . Congenital bicuspid aortic valve   . Dilated Ascending Aorta     a. 12/2013 Echo: 4.45cm.  [name] HTN (hypertension)   . History of tobacco abuse     a. 20 yrs, 1/4 ppd - quit.  [name] GERD (gastroesophageal reflux disease)   . Depression   . Diverticulitis   . C. difficile colitis     a. 07/2014.  [name] Hypokalemia   . Anxiety     Past Surgical History  Procedure Laterality Date  . Bartholin gland cyst excision    . Breast biopsy Left   . Ovary surgery      Current Outpatient Prescriptions on File Prior to Visit  Medication Sig Dispense Refill  . clonazePAM (KLONOPIN) 0.5 MG tablet Take 1 tablet (0.5 mg total) by mouth 2 (two) times daily. 30 tablet 2  . escitalopram (LEXAPRO) 10 MG tablet Take 1 tablet (10 mg total) by mouth daily. 30 tablet 2  . lisinopril-hydrochlorothiazide (PRINZIDE,ZESTORETIC) 20-25 MG per tablet Take 1 tablet by mouth daily.    . metoprolol succinate (TOPROL-XL) 25 MG 24 hr tablet Take 12.5 mg by mouth daily.    . ranitidine (ZANTAC) 150 MG tablet Take 1 tablet (150 mg total) by mouth every morning. 30 tablet 2   No current facility-administered medications on file prior to visit.    Allergies  Allergen Reactions  . Vicodin [Hydrocodone-Acetaminophen] Hives and Rash    Social History   Social History  . Marital Status: Married    Spouse Name: N/A  .  Number of Children: N/A  . Years of Education: N/A   Occupational History  . Not on file.   Social History Main Topics  . Smoking status: Former Smoker -- 0.25 packs/day for 20 years    Types: Cigarettes    Quit date: 06/07/2005  . Smokeless tobacco: Never Used  . Alcohol Use: 2.4 oz/week    2 Glasses of wine, 2 Cans of beer, 0 Standard drinks or equivalent, 0 Shots of liquor per week     Comment: occasional  . Drug Use: No  . Sexual Activity: Yes    Birth Control/ Protection: None   Other Topics Concern  . Not on file   Social History Narrative    Family History  Problem Relation Age of Onset  . Hypertension Mother   . Colon cancer Mother   . Anxiety disorder Mother   . Depression Mother   . Heart failure Father   . Hypertension Father   . Heart attack Neg Hx   . Stroke Neg Hx   . Depression Sister   . Anxiety disorder Sister   . Alcohol abuse Brother   . Schizophrenia Brother     The following portions of the patient's history were reviewed and updated as appropriate: allergies, current medications, past family history,  past medical history, past social history, past surgical history and problem list.  Review of Systems Review of Systems - General ROS: negative for - chills, fatigue, fever, hot flashes, malaise or night sweats Hematological and Lymphatic ROS: negative for - bleeding problems or swollen lymph nodes Gastrointestinal ROS: negative for - abdominal pain, blood in stools, change in bowel habits and nausea/vomiting Musculoskeletal ROS: negative for - joint pain, muscle pain or muscular weakness Genito-Urinary ROS: negative for - change in menstrual cycle, dysmenorrhea, dyspareunia, dysuria, genital discharge, genital ulcers, hematuria, incontinence, irregular/heavy menses, nocturia or pelvic pain.  POSITIVE-urinary frequency, urgency, nocturia  Objective:   BP 132/77 mmHg  Pulse 71  Temp(Src) 98.2 F (36.8 C)  Ht 5\' 4"  (1.626 m)  Wt 127 lb 8 oz  (57.834 kg)  BMI 21.87 kg/m2 CONSTITUTIONAL: Well-developed, well-nourished female in no acute distress.  HENT:  Normocephalic, atraumatic.  NECK: not examined SKIN: Skin is warm and dry. No rash noted. Not diaphoretic. No erythema. No pallor. [place]: Alert and oriented to person, place, and time. PSYCHIATRIC: Normal mood and affect. Normal behavior. Normal judgment and thought content. CARDIOVASCULAR:Not Examined RESPIRATORY: Not Examined BREASTS: Not Examined ABDOMEN: Soft, non distended; mild tenderness in lower abdomen without peritoneal signs.  No Organomegaly. PELVIC:  External Genitalia: Normal  BUS: tender bladder  Vagina: anterior vaginal wall tenderness in the region of the urethra  Cervix: Normal  Uterus: Normal size, shape,consistency, mobile; nontender  Adnexa: Normal  RV: Normal external exam  Bladder: Nontender MUSCULOSKELETAL: Normal range of motion. No tenderness.  No cyanosis, clubbing, or edema.     Assessment:   1. Painful urination - POCT urinalysis dipstick - Urine culture  2. Urinary frequency  3.  Doubt pyelonephritis; probable UTI  (Although urinalysis is normal)  4.  History of antibiotic-induced C. Difficile colitis; patient desires to await culture results before antibiotic therapy      Plan:   1.  Urine culture. 2.  Increase water intake and cranberry juice intake. 3.  Will notify patient ASAP of urine culture results.  Brayton Mars, MD  Note: This dictation was prepared with Dragon dictation along with smaller phrase technology. Any transcriptional errors that result from this process are unintentional.

## 2015-12-22 LAB — URINE CULTURE: Organism ID, Bacteria: NO GROWTH

## 2015-12-23 ENCOUNTER — Telehealth: Payer: Self-pay | Admitting: Obstetrics and Gynecology

## 2015-12-23 NOTE — Telephone Encounter (Signed)
Pt aware urine culture neg. Per mad needs referral to urology. Pt states she will make her own appt. Will contact office if needs a referral.

## 2015-12-23 NOTE — Telephone Encounter (Signed)
Pt called need refill on clonazepam.5mg 

## 2015-12-23 NOTE — Telephone Encounter (Signed)
Patient called requesting urine culture results. She also stated she is not feeling any better.

## 2016-01-03 ENCOUNTER — Telehealth: Payer: Self-pay | Admitting: Obstetrics and Gynecology

## 2016-01-03 DIAGNOSIS — N200 Calculus of kidney: Secondary | ICD-10-CM | POA: Insufficient documentation

## 2016-01-03 NOTE — Telephone Encounter (Signed)
SHE CALLED AND SHE WANTED TO LET YOU KNOW THAT SHE PASSED 2 LITTLE KIDNEY STONES, SHE HAD COME IN LAST WEEK THINKING SHE MIGHT HAVE A UTI BUT IT WAS NEGATIVE BUT OVER THE WEEKEND SHE PASSED THE STONES AND WANTED TO LET YOU KNOW SO THAT IF YOU NEEDED TO DOCUMENT THAT IN HER CHART OR NOT.

## 2016-01-04 ENCOUNTER — Telehealth: Payer: Self-pay | Admitting: *Deleted

## 2016-01-04 NOTE — Telephone Encounter (Signed)
Spoke w/ pt.  Advised her to go home and relax, as her BP is most likely elevated due to stress from ov. She admits that she was nervous during eye exam and they dilated her pupils, so she was nervous about driving home.  She does not have a BP monitor at home.  Asked pt to call back if her sx do not resolve.  She is appreciative and will call back if we can be of further assistance.

## 2016-01-04 NOTE — Telephone Encounter (Signed)
Pt calling stating she was just at the eye doctor 146/91 and then after the appointment it was 157/98 Wants to know if she can take another lisinopril Please advise

## 2016-01-27 NOTE — Telephone Encounter (Signed)
Klonopin refilled. Please advise pt to make follow up appoitment.

## 2016-01-27 NOTE — Telephone Encounter (Signed)
rx faxed and confirmed .  klonopin .5mg   id# [id number] order # [id number]

## 2016-01-29 ENCOUNTER — Other Ambulatory Visit: Payer: Self-pay | Admitting: Psychiatry

## 2016-02-01 ENCOUNTER — Ambulatory Visit (INDEPENDENT_AMBULATORY_CARE_PROVIDER_SITE_OTHER): Payer: BLUE CROSS/BLUE SHIELD | Admitting: Cardiovascular Disease

## 2016-02-01 ENCOUNTER — Telehealth: Payer: Self-pay | Admitting: Cardiovascular Disease

## 2016-02-01 ENCOUNTER — Ambulatory Visit: Payer: BLUE CROSS/BLUE SHIELD | Admitting: Nurse Practitioner

## 2016-02-01 ENCOUNTER — Encounter: Payer: Self-pay | Admitting: Psychiatry

## 2016-02-01 ENCOUNTER — Ambulatory Visit (INDEPENDENT_AMBULATORY_CARE_PROVIDER_SITE_OTHER): Payer: BLUE CROSS/BLUE SHIELD | Admitting: Psychiatry

## 2016-02-01 ENCOUNTER — Encounter: Payer: Self-pay | Admitting: Cardiovascular Disease

## 2016-02-01 VITALS — BP 160/80 | HR 69 | Ht 64.0 in | Wt 127.8 lb

## 2016-02-01 VITALS — BP 142/98 | HR 89 | Temp 97.8°F | Ht 64.0 in | Wt 128.8 lb

## 2016-02-01 DIAGNOSIS — Q239 Congenital malformation of aortic and mitral valves, unspecified: Secondary | ICD-10-CM | POA: Diagnosis not present

## 2016-02-01 DIAGNOSIS — I159 Secondary hypertension, unspecified: Secondary | ICD-10-CM | POA: Diagnosis not present

## 2016-02-01 DIAGNOSIS — I350 Nonrheumatic aortic (valve) stenosis: Secondary | ICD-10-CM

## 2016-02-01 DIAGNOSIS — F329 Major depressive disorder, single episode, unspecified: Secondary | ICD-10-CM

## 2016-02-01 DIAGNOSIS — F330 Major depressive disorder, recurrent, mild: Secondary | ICD-10-CM

## 2016-02-01 DIAGNOSIS — Q2381 Bicuspid aortic valve: Secondary | ICD-10-CM

## 2016-02-01 DIAGNOSIS — F32A Depression, unspecified: Secondary | ICD-10-CM

## 2016-02-01 DIAGNOSIS — I7121 Aneurysm of the ascending aorta, without rupture: Secondary | ICD-10-CM

## 2016-02-01 DIAGNOSIS — I712 Thoracic aortic aneurysm, without rupture: Secondary | ICD-10-CM

## 2016-02-01 DIAGNOSIS — Q231 Congenital insufficiency of aortic valve: Secondary | ICD-10-CM

## 2016-02-01 MED ORDER — ESCITALOPRAM OXALATE 10 MG PO TABS: 10.0000 mg | ORAL_TABLET | Freq: Every day | ORAL | Status: DC

## 2016-02-01 MED ORDER — LISINOPRIL-HYDROCHLOROTHIAZIDE 20-25 MG PO TABS: 1.0000 | ORAL_TABLET | Freq: Every day | ORAL | Status: DC

## 2016-02-01 MED ORDER — RANITIDINE HCL 150 MG PO TABS: 150.0000 mg | ORAL_TABLET | Freq: Every morning | ORAL | Status: DC

## 2016-02-01 MED ORDER — METOPROLOL SUCCINATE ER 25 MG PO TB24: 12.5000 mg | ORAL_TABLET | Freq: Every day | ORAL | Status: DC

## 2016-02-01 MED ORDER — CLONAZEPAM 0.5 MG PO TABS: 0.5000 mg | ORAL_TABLET | Freq: Two times a day (BID) | ORAL | Status: DC

## 2016-02-01 NOTE — Assessment & Plan Note (Signed)
Patient reports severe stress recently taking care of her 61 year old mother, stress with her own relationship, talking about financial issues, , other stressors. Tearful in the office today. Long discussion today concerning ways that she might be able to decrease her stress, such as asking other siblings for help taking care of her mother   Total encounter time more than 25 minutes  Greater than 50% was spent in counseling and coordination of care with the patient

## 2016-02-01 NOTE — Assessment & Plan Note (Signed)
Moderate aortic valve stenosis on recent echocardiogram, for a mild progression Details discussed with her including peak velocity, mean and peak gradient numbers. Repeat echocardiogram in one year

## 2016-02-01 NOTE — Progress Notes (Signed)
[facility] MD/PA/NP OP Progress Note  02/01/2016 10:40 AM Deborah Koch  MRN:  [id number]  Subjective:    Pt is a 60 yo married female presented for follow up.  She reported that she  has been doing well as she has been taking her medications as prescribed. She reported that she takes Klonopin on a daily basis. Patient reported that she has noticed improvement in her depressive symptoms. She reported that she takes Lexapro on a daily basis but she has some night sweats related to her menopause. She has to change her shirt 1 time per week. She reported that she wants to have some medications to help with the symptoms. She currently denied having any anxiety or paranoia. She appeared well dressed and reported that she has changed her glasses recently. Reported that the current combination of medications really helping her and she does not want to change any of those medications. She currently denied having any more swings anger anxiety. Reported that she has noticed that her relationship has improved with her husband. She is also trying to sell her town house in [place] and is motivated about the same. She also travels to [place]  on a regular basis to visit her daughter. She denied having any suicidal homicidal ideations or plans. She appeared alert and calm during the interview.    Chief Complaint:  Chief Complaint    Medication Refill; Follow-up; Stress     Visit Diagnosis:     ICD-9-CM ICD-10-CM   1. MDD (major depressive disorder), recurrent episode, mild (HCC) 296.31 F33.0     Past Medical History:  Past Medical History  Diagnosis Date  . Moderate to severe aortic stenosis     a. 12/2013 Echo: EF 60-65%, Grade 1 DD, bicuspid AoV with mod-sev AS [60mmHg mean gradient, AoV area 0.69cm^2 (VTI)], mod dil ascending Ao - 4.45cm, mild TR, PASP 62mmHg.  . Congenital bicuspid aortic valve   . Dilated Ascending Aorta     a. 12/2013 Echo: 4.45cm.  [name] HTN (hypertension)   . History of tobacco abuse     a.  20 yrs, 1/4 ppd - quit.  [name] GERD (gastroesophageal reflux disease)   . Depression   . Diverticulitis   . C. difficile colitis     a. 07/2014.  [name] Hypokalemia   . Anxiety     Past Surgical History  Procedure Laterality Date  . Bartholin gland cyst excision    . Breast biopsy Left   . Ovary surgery     Family History:  Family History  Problem Relation Age of Onset  . Hypertension Mother   . Colon cancer Mother   . Anxiety disorder Mother   . Depression Mother   . Heart failure Father   . Hypertension Father   . Heart attack Neg Hx   . Stroke Neg Hx   . Depression Sister   . Anxiety disorder Sister   . Alcohol abuse Brother   . Schizophrenia Brother    Social History:  Social History   Social History  . Marital Status: Married    Spouse Name: N/A  . Number of Children: N/A  . Years of Education: N/A   Social History Main Topics  . Smoking status: Former Smoker -- 0.25 packs/day for 20 years    Types: Cigarettes    Quit date: 06/07/2005  . Smokeless tobacco: Never Used  . Alcohol Use: 2.4 oz/week    2 Glasses of wine, 2 Cans of beer, 0 Standard drinks  or equivalent, 0 Shots of liquor per week     Comment: occasional  . Drug Use: No  . Sexual Activity: Yes    Birth Control/ Protection: None   Other Topics Concern  . None   Social History Narrative   Additional History:   Patient currently lives with her husband and they have been married for the past 4 years   Assessment:   Musculoskeletal: Strength & Muscle Tone: within normal limits Gait & Station: normal Patient leans: N/A  Psychiatric Specialty Exam: Anxiety Symptoms include nervous/anxious behavior.      Review of Systems  Musculoskeletal: Positive for back pain and joint pain.  Psychiatric/Behavioral: Positive for depression. The patient is nervous/anxious.   All other systems reviewed and are negative.   Blood pressure 142/90, pulse 89, temperature 97.8 F (36.6 C), temperature source  Tympanic, height 5\' 4"  (1.626 m), weight 128 lb 12.8 oz (58.423 kg), SpO2 99 %.Body mass index is 22.1 kg/(m^2).  General Appearance: Casual  Eye Contact:  Fair  Speech:  Clear and Coherent  Volume:  Normal  Mood:  Euthymic  Affect:  Congruent  Thought Process:  Coherent  Orientation:  Full (Time, Place, and Person)  Thought Content:  WDL  Suicidal Thoughts:  No  Homicidal Thoughts:  No  Memory:  NA  Judgement:  Fair  Insight:  Fair  Psychomotor Activity:  Normal  Concentration:  Fair  Recall:  [employer] of Knowledge: Fair  Language: Fair  Akathisia:  No  Handed:  Right  AIMS (if indicated):    Assets:  Communication Skills Desire for Improvement Social Support  ADL's:  Intact  Cognition: WNL  Sleep:      Current Medications: Current Outpatient Prescriptions  Medication Sig Dispense Refill  . clonazePAM (KLONOPIN) 0.5 MG tablet take 1 tablet by mouth twice a day 30 tablet 0  . escitalopram (LEXAPRO) 10 MG tablet Take 1 tablet (10 mg total) by mouth daily. 30 tablet 2  . lisinopril-hydrochlorothiazide (PRINZIDE,ZESTORETIC) 20-25 MG per tablet Take 1 tablet by mouth daily.    . metoprolol succinate (TOPROL-XL) 25 MG 24 hr tablet Take 12.5 mg by mouth daily.    . ranitidine (ZANTAC) 150 MG tablet Take 1 tablet (150 mg total) by mouth every morning. 30 tablet 2   No current facility-administered medications for this visit.    Medical Decision Making:  Review of Psycho-Social Stressors (1) and Established Problem, Worsening (2)  Treatment Plan Summary:Medication management   Depression  She will continue on Lexapro 10 mg daily   Anxiety  Klonopin 0.5 mg by mouth twice a day   Advised patient that she should start taking evening Primrose oil is available over-the-counter  to help with her perimenopausal symptoms and she demonstrated understanding.     Patient will follow up in 3 month or earlier depending on her symptoms.   More than 50% of the time spent in  psychoeducation, counseling and coordination of care.  Time spent with the patient 25 minutes   This note was generated in part or whole with voice recognition software. Voice regonition is usually quite accurate but there are transcription errors that can and very often do occur. I apologize for any typographical errors that were not detected and corrected.    Rainey Pines, MD  02/01/2016, 10:40 AM

## 2016-02-01 NOTE — Telephone Encounter (Signed)
Patient was at Psychiatrist this morning and her BP was 148/98    Patient is concerned that this is too high.  Patient said she had another episode at the eye doctor.  Patient says she does not take bp at home.    Please advise.

## 2016-02-01 NOTE — Assessment & Plan Note (Signed)
Ascending aorta not well visualized on recent echocardiogram. We'll repeat echocardiogram in one year

## 2016-02-01 NOTE — Telephone Encounter (Signed)
Pt hasn't been seen in almost a year.  Dr. Donivan Scull 11:00 new pt didn't show, so can we work her in to see him? Thank you!

## 2016-02-01 NOTE — Assessment & Plan Note (Signed)
She reports blood pressure has been running high, recommended she buy a blood pressure cuff and monitor her blood pressure at home before changing her medications. I suspect related blood pressure from recent severe stressors.

## 2016-02-01 NOTE — Progress Notes (Signed)
Patient ID: Deborah Koch, female    DOB: 1956-06-29, 60 y.o.   MRN: [id number]  HPI Comments: Deborah Koch is a very pleasant 60 year old woman with history of bicuspid aortic valve, moderate aortic valve stenosis , hypertension, GERD, who presents for routine follow-up of her aortic valve disease. She presents for add-on visit today for concern over high blood pressure She is out on disability for scoliosis among other issues She is in the process of finding a new primary care physician She does have a prior smoking history  She reports that she was recently at the eye doctor and noticed that her blood pressure was high Like to see her psychiatrist today and again reported that her blood pressure was elevated, was initially in the 140 range, seemed to go higher on recheck She does not have a blood pressure cuff at home to check her numbers She does report significant stress at home Problems taking care of a elderly 61 year old mother, she is having to do most of the work This has put extra stress on her relationship with her husband Also with underlying medical issues, she is out on disability Denies any shortness of breath or chest pain with exertion concerning for progression of her aortic valve stenosis.  Tearful in the office today which she attributes to many of the stressors she is under.  Recent echocardiogram showing bicuspid aortic valve, moderate aortic valve stenosis, slight increase in her gradient  EKG on today's visit shows normal sinus rhythm with rate 69 bpm, no significant ST or T-wave changes  Other past medical history reviewed Echocardiogram January 2015 and repeat echocardiogram September 2015 essentially unchanged She has 4.1 cm ascending aorta dilation, with moderate aortic valve stenosis, peak velocity 283 cm/s, mean gradient 27 mmHg, peak gradient 44 mmHg .   In August 2015, she reports having diverticulitis, C. difficile requiring long course of antibiotics  in recovery   She reports that she used to smoke but stopped 10 years ago. Smoked since he was younger. She reports her cholesterol is very good though she does not know any specifics. No diabetes. Occasional anxiety and takes Klonopin when necessary   Allergies  Allergen Reactions  . Vicodin [Hydrocodone-Acetaminophen] Hives and Rash    Outpatient Encounter Prescriptions as of 02/01/2016  Medication Sig  . clonazePAM (KLONOPIN) 0.5 MG tablet Take 1 tablet (0.5 mg total) by mouth 2 (two) times daily.  [name] escitalopram (LEXAPRO) 10 MG tablet Take 1 tablet (10 mg total) by mouth daily.  [name] lisinopril-hydrochlorothiazide (PRINZIDE,ZESTORETIC) 20-25 MG tablet Take 1 tablet by mouth daily.  . metoprolol succinate (TOPROL-XL) 25 MG 24 hr tablet Take 0.5 tablets (12.5 mg total) by mouth daily.  . ranitidine (ZANTAC) 150 MG tablet Take 1 tablet (150 mg total) by mouth every morning.  . [DISCONTINUED] lisinopril-hydrochlorothiazide (PRINZIDE,ZESTORETIC) 20-25 MG per tablet Take 1 tablet by mouth daily.  . [DISCONTINUED] metoprolol succinate (TOPROL-XL) 25 MG 24 hr tablet Take 12.5 mg by mouth daily.   No facility-administered encounter medications on file as of 02/01/2016.    Past Medical History  Diagnosis Date  . Moderate to severe aortic stenosis     a. 12/2013 Echo: EF 60-65%, Grade 1 DD, bicuspid AoV with mod-sev AS [22mmHg mean gradient, AoV area 0.69cm^2 (VTI)], mod dil ascending Ao - 4.45cm, mild TR, PASP 85mmHg.  . Congenital bicuspid aortic valve   . Dilated Ascending Aorta     a. 12/2013 Echo: 4.45cm.  [name] HTN (hypertension)   . History  of tobacco abuse     a. 20 yrs, 1/4 ppd - quit.  [name] GERD (gastroesophageal reflux disease)   . Depression   . Diverticulitis   . C. difficile colitis     a. 07/2014.  [name] Hypokalemia   . Anxiety     Past Surgical History  Procedure Laterality Date  . Bartholin gland cyst excision    . Breast biopsy Left   . Ovary surgery      Social History   reports that she has been smoking Cigarettes.  She has a 5 pack-year smoking history. She has never used smokeless tobacco. She reports that she drinks about 2.4 oz of alcohol per week. She reports that she does not use illicit drugs.  Family History family history includes Alcohol abuse in her brother; Anxiety disorder in her mother and sister; Colon cancer in her mother; Depression in her mother and sister; Heart failure in her father; Hypertension in her father and mother; Schizophrenia in her brother. There is no history of Heart attack or Stroke.   Review of Systems  Constitutional: Negative.   Respiratory: Negative.   Cardiovascular: Negative.   Gastrointestinal: Negative.   Musculoskeletal: Negative.   Skin: Negative.   Neurological: Negative.   Hematological: Negative.   Psychiatric/Behavioral: Positive for sleep disturbance. The patient is nervous/anxious.   All other systems reviewed and are negative.   BP 160/80 mmHg  Pulse 69  Ht 5\' 4"  (1.626 m)  Wt 127 lb 12 oz (57.947 kg)  BMI 21.92 kg/m2  Physical Exam  Constitutional: She is oriented to person, place, and time. She appears well-developed and well-nourished.  HENT:  Head: Normocephalic.  Nose: Nose normal.  Mouth/Throat: Oropharynx is clear and moist.  Eyes: Conjunctivae are normal. Pupils are equal, round, and reactive to light.  Neck: Normal range of motion. Neck supple. No JVD present.  Cardiovascular: Normal rate, regular rhythm, S1 normal, S2 normal and intact distal pulses.  Exam reveals no gallop and no friction rub.   Murmur heard.  Systolic murmur is present with a grade of 3/6  Pulmonary/Chest: Effort normal and breath sounds normal. No respiratory distress. She has no wheezes. She has no rales. She exhibits no tenderness.  Abdominal: Soft. Bowel sounds are normal. She exhibits no distension. There is no tenderness.  Musculoskeletal: Normal range of motion. She exhibits no edema or tenderness.   Lymphadenopathy:    She has no cervical adenopathy.  Neurological: She is alert and oriented to person, place, and time. Coordination normal.  Skin: Skin is warm and dry. No rash noted. No erythema.  Psychiatric: She has a normal mood and affect. Her behavior is normal. Judgment and thought content normal.    Assessment and Plan  Nursing note and vitals reviewed.

## 2016-02-01 NOTE — Patient Instructions (Signed)
You are doing well. No medication changes were made.  Please check blood pressures at home Call if they run high  >140/>90  Please call us if you have new issues that need to be addressed before your next appt.  Your physician wants you to follow-up in: 12 months.  Repeat echo in one year  You will receive a reminder letter in the mail two months in advance. If you don't receive a letter, please call our office to schedule the follow-up appointment.

## 2016-02-25 ENCOUNTER — Ambulatory Visit: Payer: BLUE CROSS/BLUE SHIELD | Admitting: Cardiovascular Disease

## 2016-04-25 ENCOUNTER — Telehealth: Payer: Self-pay | Admitting: Cardiovascular Disease

## 2016-04-25 ENCOUNTER — Telehealth: Payer: Self-pay | Admitting: Obstetrics and Gynecology

## 2016-04-25 ENCOUNTER — Ambulatory Visit (INDEPENDENT_AMBULATORY_CARE_PROVIDER_SITE_OTHER): Payer: BLUE CROSS/BLUE SHIELD | Admitting: Psychiatry

## 2016-04-25 ENCOUNTER — Encounter: Payer: Self-pay | Admitting: Psychiatry

## 2016-04-25 VITALS — BP 138/98 | HR 92 | Temp 97.4°F | Ht 64.0 in | Wt 129.8 lb

## 2016-04-25 DIAGNOSIS — F331 Major depressive disorder, recurrent, moderate: Secondary | ICD-10-CM | POA: Diagnosis not present

## 2016-04-25 MED ORDER — ESCITALOPRAM OXALATE 10 MG PO TABS: 10.0000 mg | ORAL_TABLET | Freq: Every day | ORAL | Status: DC

## 2016-04-25 MED ORDER — CLONAZEPAM 0.5 MG PO TABS: 0.5000 mg | ORAL_TABLET | Freq: Two times a day (BID) | ORAL | Status: DC

## 2016-04-25 NOTE — Telephone Encounter (Signed)
Patient would like to know if Dr D can refill her meds that she currently gets from Dr Gretel Acre. She doesn't feel as if she needs to see Dr Gretel Acre anymore. She would like a call back.Thanks

## 2016-04-25 NOTE — Progress Notes (Signed)
BH MD/PA/NP OP Progress Note  04/25/2016 11:22 AM Deborah Koch  MRN:  [id number]  Subjective:    Pt is a 60 yo married female presented for follow up.  She reported that she  has been doing well as she has been taking her medications as prescribed. She reported that she was able to sell her house in [place] and was very excited about the same. She reported that he sold in 3 days. Patient reported that she is also helping take care of her mother who is 102 years old. She has made plans with her sisters. They're all taking turns and she has some responsivity of buying groceries for her. Patient reported that she is excited as everything is falling in place. She appeared calm and collective. She has been compliant with her medications. She currently denied having any suicidal homicidal ideations or plans. Thoughts for the mother stated from her daughter. She is looking forward to having the massage done over the weekend. He was showing me the picture of her granddaughter as she was taking care of her for the past 3 days in [place].    Chief Complaint:  Chief Complaint    Follow-up; Medication Refill     Visit Diagnosis:     ICD-9-CM ICD-10-CM   1. MDD (major depressive disorder), recurrent episode, moderate ([place]) 296.32 F33.1     Past Medical History:  Past Medical History  Diagnosis Date  . Moderate to severe aortic stenosis     a. 12/2013 Echo: EF 60-65%, Grade 1 DD, bicuspid AoV with mod-sev AS [30mmHg mean gradient, AoV area 0.69cm^2 (VTI)], mod dil ascending Ao - 4.45cm, mild TR, PASP 79mmHg.  . Congenital bicuspid aortic valve   . Dilated Ascending Aorta     a. 12/2013 Echo: 4.45cm.  [name] HTN (hypertension)   . History of tobacco abuse     a. 20 yrs, 1/4 ppd - quit.  [name] GERD (gastroesophageal reflux disease)   . Depression   . Diverticulitis   . C. difficile colitis     a. 07/2014.  [name] Hypokalemia   . Anxiety     Past Surgical History  Procedure Laterality Date  . Bartholin gland  cyst excision    . Breast biopsy Left   . Ovary surgery     Family History:  Family History  Problem Relation Age of Onset  . Hypertension Mother   . Colon cancer Mother   . Anxiety disorder Mother   . Depression Mother   . Heart failure Father   . Hypertension Father   . Heart attack Neg Hx   . Stroke Neg Hx   . Depression Sister   . Anxiety disorder Sister   . Alcohol abuse Brother   . Schizophrenia Brother    Social History:  Social History   Social History  . Marital Status: Married    Spouse Name: N/A  . Number of Children: N/A  . Years of Education: N/A   Social History Main Topics  . Smoking status: Current Every Day Smoker -- 0.25 packs/day for 20 years    Types: Cigarettes    Last Attempt to Quit: 06/07/2005  . Smokeless tobacco: Never Used  . Alcohol Use: 2.4 oz/week    2 Glasses of wine, 2 Cans of beer, 0 Shots of liquor, 0 Standard drinks or equivalent per week     Comment: occasional  . Drug Use: No  . Sexual Activity: Yes    Birth Control/ Protection: None  Other Topics Concern  . None   Social History Narrative   Additional History:   Patient currently lives with her husband and they have been married for the past 4 years   Assessment:   Musculoskeletal: Strength & Muscle Tone: within normal limits Gait & Station: normal Patient leans: N/A  Psychiatric Specialty Exam: Anxiety Symptoms include nervous/anxious behavior.      Review of Systems  Musculoskeletal: Positive for back pain and joint pain.  Psychiatric/Behavioral: Positive for depression. The patient is nervous/anxious.   All other systems reviewed and are negative.   Blood pressure 138/98, pulse 92, temperature 97.4 F (36.3 C), temperature source Tympanic, height 5\' 4"  (1.626 m), weight 129 lb 12.8 oz (58.877 kg), SpO2 97 %.Body mass index is 22.27 kg/(m^2).  General Appearance: Casual  Eye Contact:  Fair  Speech:  Clear and Coherent  Volume:  Normal  Mood:  Euthymic   Affect:  Congruent  Thought Process:  Coherent  Orientation:  Full (Time, Place, and Person)  Thought Content:  WDL  Suicidal Thoughts:  No  Homicidal Thoughts:  No  Memory:  NA  Judgement:  Fair  Insight:  Fair  Psychomotor Activity:  Normal  Concentration:  Fair  Recall:  [employer] of Knowledge: Fair  Language: Fair  Akathisia:  No  Handed:  Right  AIMS (if indicated):    Assets:  Communication Skills Desire for Improvement Social Support  ADL's:  Intact  Cognition: WNL  Sleep:      Current Medications: Current Outpatient Prescriptions  Medication Sig Dispense Refill  . clonazePAM (KLONOPIN) 0.5 MG tablet Take 1 tablet (0.5 mg total) by mouth 2 (two) times daily. 30 tablet 2  . escitalopram (LEXAPRO) 10 MG tablet Take 1 tablet (10 mg total) by mouth daily. 90 tablet 2  . lisinopril-hydrochlorothiazide (PRINZIDE,ZESTORETIC) 20-25 MG tablet Take 1 tablet by mouth daily. 90 tablet 4  . metoprolol succinate (TOPROL-XL) 25 MG 24 hr tablet Take 0.5 tablets (12.5 mg total) by mouth daily. 45 tablet 4  . ranitidine (ZANTAC) 150 MG tablet Take 1 tablet (150 mg total) by mouth every morning. 30 tablet 2   No current facility-administered medications for this visit.    Medical Decision Making:  Review of Psycho-Social Stressors (1) and Established Problem, Worsening (2)  Treatment Plan Summary:Medication management   Depression  She will continue on Lexapro 10 mg daily   Anxiety  Klonopin 0.5 mg by mouth twice a day   Patient will follow up in 3 month or earlier depending on her symptoms.   More than 50% of the time spent in psychoeducation, counseling and coordination of care.  Time spent with the patient 25 minutes   This note was generated in part or whole with voice recognition software. Voice regonition is usually quite accurate but there are transcription errors that can and very often do occur. I apologize for any typographical errors that were not detected and  corrected.    Rainey Pines, MD  04/25/2016, 11:22 AM

## 2016-04-25 NOTE — Telephone Encounter (Signed)
Pt calling stating her BP is a bit high Its 132/98, she's at another doctor's office and she is just letting us know.  Not sure if we need to adjust her medication.  She was told to call us if it got a bit high.  States not sure if she is just stressed by being at the doctors or not Please advise.

## 2016-04-25 NOTE — Telephone Encounter (Signed)
Wants Dr d to refill klonipin 0.5mg  bid, lexapro 10mg  qd, zantact 150 qd. Pt aware I will need to ask mad.

## 2016-04-26 NOTE — Telephone Encounter (Signed)
Pt  Aware mad will refill in after 3 month f/u. appt made for 07/25/16 at 10. Pt will be seen for AE and meds.

## 2016-05-21 ENCOUNTER — Other Ambulatory Visit: Payer: Self-pay | Admitting: Psychiatry

## 2016-06-28 ENCOUNTER — Telehealth: Payer: Self-pay | Admitting: Obstetrics and Gynecology

## 2016-06-28 NOTE — Telephone Encounter (Signed)
This pt called and said she has a mass in breast/ swollen tender and warm. Dr Tennis Must didn't have anything available until 7/18 @ 315pm. Didn't know if she should be seen sooner

## 2016-06-28 NOTE — Telephone Encounter (Signed)
Pt has had pain x 1day. Painful, tender, warm to the touch, and red. No fever. Last mammo 02/2015 neg. Advised pt to be here in the am at 8:15.

## 2016-06-29 ENCOUNTER — Ambulatory Visit (INDEPENDENT_AMBULATORY_CARE_PROVIDER_SITE_OTHER): Payer: BLUE CROSS/BLUE SHIELD | Admitting: Obstetrics and Gynecology

## 2016-06-29 ENCOUNTER — Encounter: Payer: Self-pay | Admitting: Obstetrics and Gynecology

## 2016-06-29 VITALS — BP 111/72 | HR 74 | Ht 64.0 in | Wt 127.2 lb

## 2016-06-29 DIAGNOSIS — N61 Mastitis without abscess: Secondary | ICD-10-CM | POA: Diagnosis not present

## 2016-06-29 DIAGNOSIS — N644 Mastodynia: Secondary | ICD-10-CM

## 2016-06-29 MED ORDER — SULFAMETHOXAZOLE-TRIMETHOPRIM 800-160 MG PO TABS: 1.0000 | ORAL_TABLET | Freq: Two times a day (BID) | ORAL | Status: DC

## 2016-06-29 NOTE — Progress Notes (Signed)
GYN ENCOUNTER NOTE  Subjective:       Deborah Koch is a 60 y.o. No obstetric history on file. female is here for gynecologic evaluation of the following issues:  1. Breast lump and tenderness  Patient presents with several day history of right breast lump and tenderness.  Symptoms have progressively worsened where she cannot wear a bra. No nipple discharge. No fever or chills or sweats.   Gynecologic History No LMP recorded. Patient is postmenopausal. Contraception: post menopausal status Last mammogram: 2016 BI-RADS 1  Obstetric History OB History  No data available    Past Medical History  Diagnosis Date  . Moderate to severe aortic stenosis     a. 12/2013 Echo: EF 60-65%, Grade 1 DD, bicuspid AoV with mod-sev AS [89mmHg mean gradient, AoV area 0.69cm^2 (VTI)], mod dil ascending Ao - 4.45cm, mild TR, PASP 59mmHg.  . Congenital bicuspid aortic valve   . Dilated Ascending Aorta     a. 12/2013 Echo: 4.45cm.  [name] HTN (hypertension)   . History of tobacco abuse     a. 20 yrs, 1/4 ppd - quit.  [name] GERD (gastroesophageal reflux disease)   . Depression   . Diverticulitis   . C. difficile colitis     a. 07/2014.  [name] Hypokalemia   . Anxiety     Past Surgical History  Procedure Laterality Date  . Bartholin gland cyst excision    . Breast biopsy Left   . Ovary surgery      Current Outpatient Prescriptions on File Prior to Visit  Medication Sig Dispense Refill  . clonazePAM (KLONOPIN) 0.5 MG tablet Take 1 tablet (0.5 mg total) by mouth 2 (two) times daily. 30 tablet 2  . escitalopram (LEXAPRO) 10 MG tablet Take 1 tablet (10 mg total) by mouth daily. 90 tablet 2  . lisinopril-hydrochlorothiazide (PRINZIDE,ZESTORETIC) 20-25 MG tablet Take 1 tablet by mouth daily. 90 tablet 4  . metoprolol succinate (TOPROL-XL) 25 MG 24 hr tablet Take 0.5 tablets (12.5 mg total) by mouth daily. 45 tablet 4  . ranitidine (ZANTAC) 150 MG tablet take 1 tablet by mouth every morning 30 tablet 2    No current facility-administered medications on file prior to visit.    Allergies  Allergen Reactions  . Vicodin [Hydrocodone-Acetaminophen] Hives and Rash    Social History   Social History  . Marital Status: Married    Spouse Name: N/A  . Number of Children: N/A  . Years of Education: N/A   Occupational History  . Not on file.   Social History Main Topics  . Smoking status: Current Every Day Smoker -- 0.25 packs/day for 20 years    Types: Cigarettes    Last Attempt to Quit: 06/07/2005  . Smokeless tobacco: Never Used  . Alcohol Use: 2.4 oz/week    2 Glasses of wine, 2 Cans of beer, 0 Shots of liquor, 0 Standard drinks or equivalent per week     Comment: occasional  . Drug Use: No  . Sexual Activity: Yes    Birth Control/ Protection: None   Other Topics Concern  . Not on file   Social History Narrative    Family History  Problem Relation Age of Onset  . Hypertension Mother   . Colon cancer Mother   . Anxiety disorder Mother   . Depression Mother   . Heart failure Father   . Hypertension Father   . Heart attack Neg Hx   . Stroke Neg Hx   . Depression  Sister   . Anxiety disorder Sister   . Alcohol abuse Brother   . Schizophrenia Brother     The following portions of the patient's history were reviewed and updated as appropriate: allergies, current medications, past family history, past medical history, past social history, past surgical history and problem list.  Review of Systems Review of Systems - Per history of present illness Review of Systems - General ROS: negative for - chills, fatigue, fever, hot flashes, malaise or night sweats Hematological and Lymphatic ROS: negative for - bleeding problems or swollen lymph nodes Gastrointestinal ROS: negative for - abdominal pain, blood in stools, change in bowel habits and nausea/vomiting Musculoskeletal ROS: negative for - joint pain, muscle pain or muscular weakness Genito-Urinary ROS: negative for -  change in menstrual cycle, dysmenorrhea, dyspareunia, dysuria, genital discharge, genital ulcers, hematuria, incontinence, irregular/heavy menses, nocturia or pelvic pain  Objective:   BP 111/72 mmHg  Pulse 74  Ht 5\' 4"  (1.626 m)  Wt 127 lb 3.2 oz (57.698 kg)  BMI 21.82 kg/m2 CONSTITUTIONAL: Well-developed, well-nourished female in no acute distress.  HENT:  Normocephalic, atraumatic.  NECK: Normal range of motion, supple, no masses.  Normal thyroid.  SKIN: Skin is warm and dry. No rash noted. Not diaphoretic. No erythema. No pallor. [place]: Alert and oriented to person, place, and time. PSYCHIATRIC: Normal mood and affect. Normal behavior. Normal judgment and thought content. CARDIOVASCULAR:Not Examined RESPIRATORY: Not Examined BREASTS: Asymmetry with right breast larger than left;  right breast with hyperemia and edema encompassing both upper outer quadrants and medial quadrants respectively; area of involvement is marked with marker and measures 10 cm x 16 cm; nipple everted; areola is obscured by hyperemia; no peau d'orange affect; 4/4 tender;  left breast unremarkable;  no significant axillary adenopathy or supraclavicular adenopathy ABDOMEN: Soft, non distended; Non tender.  No Organomegaly. PELVIC: Not examined  Assessment:   1. Breast inflammation - Ambulatory referral to General Surgery  2. Breast tenderness in female - Ambulatory referral to General Surgery  Concern is for nonpuerpural mastitis versus inflammatory breast carcinoma    Plan:   1. Septra DS twice a day for 14 days 2. General surgery referral 3. Diagnostic mammogram and ultrasound ordered  Brayton Mars, MD  Note: This dictation was prepared with Dragon dictation along with smaller phrase technology. Any transcriptional errors that result from this process are unintentional.

## 2016-06-29 NOTE — Patient Instructions (Signed)
1. Septra DS twice a day for 14 days 2. Diagnostic mammogram and ultrasound ordered 3. Tylenol and Motrin for symptomatic relief of pain 4. General surgery referral

## 2016-06-30 DIAGNOSIS — N644 Mastodynia: Secondary | ICD-10-CM | POA: Insufficient documentation

## 2016-06-30 DIAGNOSIS — N61 Mastitis without abscess: Secondary | ICD-10-CM | POA: Insufficient documentation

## 2016-07-04 ENCOUNTER — Encounter: Payer: Self-pay | Admitting: General Surgery

## 2016-07-04 ENCOUNTER — Ambulatory Visit: Payer: Self-pay | Admitting: Obstetrics and Gynecology

## 2016-07-04 ENCOUNTER — Ambulatory Visit (INDEPENDENT_AMBULATORY_CARE_PROVIDER_SITE_OTHER): Payer: BLUE CROSS/BLUE SHIELD | Admitting: General Surgery

## 2016-07-04 VITALS — BP 140/78 | HR 78 | Resp 12 | Ht 64.0 in | Wt 126.0 lb

## 2016-07-04 DIAGNOSIS — Z1231 Encounter for screening mammogram for malignant neoplasm of breast: Secondary | ICD-10-CM | POA: Diagnosis not present

## 2016-07-04 DIAGNOSIS — N61 Mastitis without abscess: Secondary | ICD-10-CM

## 2016-07-04 NOTE — Progress Notes (Signed)
Patient ID: Deborah Koch, female   DOB: 07/04/1956, 60 y.o.   MRN: [id number]  Chief Complaint  Patient presents with  . Other    mastitis    HPI Deborah Koch is a 60 y.o. female here today for an evaluation of mastitis of her right breast. She first noticed the redness on 06/27/16. She was placed on Septra on 06/29/16 by her PCP/Gyn, which she is still taking. She states the area feels much better since being on antibiotics. She had this same problem 2-3 years ago, she states it went away after antibiotic treatment. No underlying cause of the mastitis was discerned. Patient had a left breast cyst excised in the past. I have reviewed the history of present illness with the patient. HPI  Past Medical History  Diagnosis Date  . Moderate to severe aortic stenosis     a. 12/2013 Echo: EF 60-65%, Grade 1 DD, bicuspid AoV with mod-sev AS [25mmHg mean gradient, AoV area 0.69cm^2 (VTI)], mod dil ascending Ao - 4.45cm, mild TR, PASP 32mmHg.  . Congenital bicuspid aortic valve   . Dilated Ascending Aorta     a. 12/2013 Echo: 4.45cm.  [name] HTN (hypertension)   . History of tobacco abuse     a. 20 yrs, 1/4 ppd - quit.  [name] GERD (gastroesophageal reflux disease)   . Depression   . Diverticulitis   . C. difficile colitis     a. 07/2014.  [name] Hypokalemia   . Anxiety   . H/O mastitis 2015    Past Surgical History  Procedure Laterality Date  . Bartholin gland cyst excision    . Breast biopsy Left   . Ovary surgery    . Colonoscopy  2013    Dr. Allen Norris    Family History  Problem Relation Age of Onset  . Hypertension Mother   . Colon cancer Mother   . Anxiety disorder Mother   . Depression Mother   . Heart failure Father   . Hypertension Father   . Heart attack Neg Hx   . Stroke Neg Hx   . Depression Sister   . Anxiety disorder Sister   . Alcohol abuse Brother   . Schizophrenia Brother     Social History Social History  Substance Use Topics  . Smoking status: Current Every  Day Smoker -- 0.25 packs/day for 20 years    Types: Cigarettes    Last Attempt to Quit: 06/07/2005  . Smokeless tobacco: Never Used  . Alcohol Use: 2.4 oz/week    2 Glasses of wine, 2 Cans of beer, 0 Shots of liquor, 0 Standard drinks or equivalent per week     Comment: occasional    Allergies  Allergen Reactions  . Vicodin [Hydrocodone-Acetaminophen] Hives and Rash    Current Outpatient Prescriptions  Medication Sig Dispense Refill  . clonazePAM (KLONOPIN) 0.5 MG tablet Take 1 tablet (0.5 mg total) by mouth 2 (two) times daily. 30 tablet 2  . escitalopram (LEXAPRO) 10 MG tablet Take 1 tablet (10 mg total) by mouth daily. 90 tablet 2  . lisinopril-hydrochlorothiazide (PRINZIDE,ZESTORETIC) 20-25 MG tablet Take 1 tablet by mouth daily. 90 tablet 4  . metoprolol succinate (TOPROL-XL) 25 MG 24 hr tablet Take 0.5 tablets (12.5 mg total) by mouth daily. 45 tablet 4  . ranitidine (ZANTAC) 150 MG tablet take 1 tablet by mouth every morning 30 tablet 2  . sulfamethoxazole-trimethoprim (BACTRIM DS,SEPTRA DS) 800-160 MG tablet Take 1 tablet by mouth 2 (two) times daily. 28 tablet  0   No current facility-administered medications for this visit.    Review of Systems Review of Systems  Constitutional: Negative.   Respiratory: Negative.   Cardiovascular: Negative.   Skin: Positive for color change (right breast is no longer red or tender).    Blood pressure 140/78, pulse 78, resp. rate 12, height 5\' 4"  (1.626 m), weight 126 lb (57.153 kg).  Physical Exam Physical Exam  Constitutional: She is oriented to person, place, and time. She appears well-developed and well-nourished.  Eyes: Conjunctivae are normal. No scleral icterus.  Neck: Neck supple. No thyromegaly present.  Cardiovascular: Normal rate, regular rhythm, S1 normal and S2 normal.   Murmur heard.  Systolic murmur is present with a grade of 3/6  Pulmonary/Chest: Effort normal and breath sounds normal. Right breast exhibits no  inverted nipple, no mass, no nipple discharge, no skin change and no tenderness. Left breast exhibits no inverted nipple, no mass, no nipple discharge, no skin change and no tenderness.    Lymphadenopathy:    She has no cervical adenopathy.    She has no axillary adenopathy.  Neurological: She is alert and oriented to person, place, and time.  Skin: Skin is warm and dry.    Data Reviewed Prior notes reviewed  Assessment    Mastitis of Right Breast, resolving.     Plan    Patient to continue taking Septra as prescribed.  Right breast diagnostic mammogram scheduled. Return to office after mammogram/ultrasound completed.      PCP: Dr. Letta Moynahan 07/04/2016, 3:04 PM

## 2016-07-04 NOTE — Patient Instructions (Addendum)
Return to office after mammogram/office completed.  Mastitis Mastitis is inflammation of the breast tissue. It occurs most often in women who are breastfeeding, but it can also affect other women, and even sometimes men. CAUSES  Mastitis is usually caused by a bacterial infection. Bacteria enter the breast tissue through cuts or openings in the skin. Typically, this occurs with breastfeeding because of cracked or irritated skin. Sometimes, it can occur even when there is no opening in the skin. It can be associated with plugged milk (lactiferous) ducts. Nipple piercing can also lead to mastitis. Also, some forms of breast cancer can cause mastitis. SIGNS AND SYMPTOMS   Swelling, redness, tenderness, and pain in an area of the breast.  Swelling of the glands under the arm on the same side.  Fever. If an infection is allowed to progress, a collection of pus (abscess) may develop. DIAGNOSIS  Your health care provider can usually diagnose mastitis based on your symptoms and a physical exam. Tests may be done to help confirm the diagnosis. These may include:   Removal of pus from the breast by applying pressure to the area. This pus can be examined in the lab to determine which bacteria are present. If an abscess has developed, the fluid in the abscess can be removed with a needle. This can also be used to confirm the diagnosis and determine the bacteria present. In most cases, pus will not be present.  Blood tests to determine if your body is fighting a bacterial infection.  Mammogram or ultrasound tests to rule out other problems or diseases. TREATMENT  Antibiotic medicine is used to treat a bacterial infection. Your health care provider will determine which bacteria are most likely causing the infection and will select an appropriate antibiotic. This is sometimes changed based on the results of tests performed to identify the bacteria, or if there is no response to the antibiotic selected.  Antibiotics are usually given by mouth. You may also be given medicine for pain. Mastitis that occurs with breastfeeding will sometimes go away on its own, so your health care provider may choose to wait 24 hours after first seeing you to decide whether a prescription medicine is needed. HOME CARE INSTRUCTIONS   Only take over-the-counter or prescription medicines for pain, fever, or discomfort as directed by your health care provider.  If your health care provider prescribed an antibiotic, take the medicine as directed. Make sure you finish it even if you start to feel better.  Do not wear a tight or underwire bra. Wear a soft, supportive bra.  Increase your fluid intake, especially if you have a fever.  Women who are breastfeeding should follow these instructions:  Continue to empty the breast. Your health care provider can tell you whether this milk is safe for your infant or needs to be thrown out. You may be told to stop nursing until your health care provider thinks it is safe for your baby. Use a breast pump if you are advised to stop nursing.  Keep your nipples clean and dry.  Empty the first breast completely before going to the other breast. If your baby is not emptying your breasts completely for some reason, use a breast pump to empty your breasts.  If you go back to work, pump your breasts while at work to stay in time with your nursing schedule.  Avoid allowing your breasts to become overly filled with milk (engorged). SEEK MEDICAL CARE IF:   You have pus-like discharge from  the breast.  Your symptoms do not improve with the treatment prescribed by your health care provider within 2 days. SEEK IMMEDIATE MEDICAL CARE IF:   Your pain and swelling are getting worse.  You have pain that is not controlled with medicine.  You have a red line extending from the breast toward your armpit.  You have a fever or persistent symptoms for more than 2-3 days.  You have a fever  and your symptoms suddenly get worse.   This information is not intended to replace advice given to you by your health care provider. Make sure you discuss any questions you have with your health care provider.   Document Released: 12/04/2005 Document Revised: 12/09/2013 Document Reviewed: 07/04/2013 Elsevier Interactive Patient Education [employer].

## 2016-07-06 ENCOUNTER — Telehealth: Payer: Self-pay | Admitting: Obstetrics and Gynecology

## 2016-07-06 NOTE — Telephone Encounter (Signed)
Pt has been on bactrim x 7d. She c/o of not sleeping well, stiff neck when she awakes, h/a, dizzy, and nausea. Pt states she thought she was feeling better but now sx are back. Pt was very upset. Crying. Doesn't think she can take 7 more days of med. All sx with her breast are gone.  Offered to d/c bactrim and start dicloxacillin per mad. PT is reluctant to start new atb with h/o cdiff. At this time she will stop bactrim and call me back if she has any issues with her breast. If sx re occur will call in dicloxacillin. Pt will call me Monday with up date.

## 2016-07-06 NOTE — Telephone Encounter (Signed)
She is having side effects from antibiotics she is taking

## 2016-07-10 ENCOUNTER — Telehealth: Payer: Self-pay | Admitting: Obstetrics and Gynecology

## 2016-07-10 NOTE — Telephone Encounter (Signed)
Pt aware per vm I received message.

## 2016-07-13 ENCOUNTER — Ambulatory Visit
Admission: RE | Admit: 2016-07-13 | Discharge: 2016-07-13 | Disposition: A | Discharge status: Home / Self Care | Payer: BLUE CROSS/BLUE SHIELD | Source: Ambulatory Visit | Attending: Obstetrics and Gynecology | Admitting: Obstetrics and Gynecology

## 2016-07-13 ENCOUNTER — Ambulatory Visit: Payer: BLUE CROSS/BLUE SHIELD

## 2016-07-13 ENCOUNTER — Other Ambulatory Visit: Payer: Self-pay | Admitting: Obstetrics and Gynecology

## 2016-07-13 ENCOUNTER — Other Ambulatory Visit: Payer: BLUE CROSS/BLUE SHIELD

## 2016-07-13 DIAGNOSIS — N61 Mastitis without abscess: Secondary | ICD-10-CM | POA: Diagnosis not present

## 2016-07-13 DIAGNOSIS — N644 Mastodynia: Secondary | ICD-10-CM | POA: Diagnosis not present

## 2016-07-18 ENCOUNTER — Ambulatory Visit (INDEPENDENT_AMBULATORY_CARE_PROVIDER_SITE_OTHER): Payer: BLUE CROSS/BLUE SHIELD | Admitting: General Surgery

## 2016-07-18 ENCOUNTER — Encounter: Payer: Self-pay | Admitting: General Surgery

## 2016-07-18 VITALS — BP 148/88 | HR 72 | Resp 12 | Ht 66.0 in | Wt 121.0 lb

## 2016-07-18 DIAGNOSIS — N61 Mastitis without abscess: Secondary | ICD-10-CM | POA: Diagnosis not present

## 2016-07-18 DIAGNOSIS — Z1231 Encounter for screening mammogram for malignant neoplasm of breast: Secondary | ICD-10-CM | POA: Diagnosis not present

## 2016-07-18 NOTE — Patient Instructions (Addendum)
Patient to return in one year screening mammogram

## 2016-07-18 NOTE — Progress Notes (Signed)
Patient ID: Deborah Koch, female   DOB: 1956-07-12, 60 y.o.   MRN: [id number]  Chief Complaint  Patient presents with  . Follow-up    mammogram    HPI Deborah Koch is a 60 y.o. female who presents for a follow up evaluation on right mastitis. Patient was treated with Septra per Dr. Brayton Mars, MD. Patient states that the condition has completely resolved. She denies redness, warmth and tenderness. The most recent mammogram was done on 07/12/16.  Patient does perform regular self breast checks and gets regular mammograms done.   I have reviewed the history of present illness with the patient.  HPI  Past Medical History:  Diagnosis Date  . Anxiety   . C. difficile colitis    a. 07/2014.  [name] Congenital bicuspid aortic valve   . Depression   . Dilated Ascending Aorta    a. 12/2013 Echo: 4.45cm.  . Diverticulitis   . GERD (gastroesophageal reflux disease)   . H/O mastitis 2015  . History of tobacco abuse    a. 20 yrs, 1/4 ppd - quit.  [name] HTN (hypertension)   . Hypokalemia   . Moderate to severe aortic stenosis    a. 12/2013 Echo: EF 60-65%, Grade 1 DD, bicuspid AoV with mod-sev AS [11mmHg mean gradient, AoV area 0.69cm^2 (VTI)], mod dil ascending Ao - 4.45cm, mild TR, PASP 87mmHg.    Past Surgical History:  Procedure Laterality Date  . BARTHOLIN GLAND CYST EXCISION    . BREAST BIOPSY Left    neg  . COLONOSCOPY  2013   Dr. Allen Norris  . OVARY SURGERY      Family History  Problem Relation Age of Onset  . Hypertension Mother   . Colon cancer Mother   . Anxiety disorder Mother   . Depression Mother   . Heart failure Father   . Hypertension Father   . Depression Sister   . Anxiety disorder Sister   . Alcohol abuse Brother   . Schizophrenia Brother   . Breast cancer Maternal Aunt   . Heart attack Neg Hx   . Stroke Neg Hx     Social History Social History  Substance Use Topics  . Smoking status: Current Every Day Smoker    Packs/day: 0.25    Years:  20.00    Types: Cigarettes    Last attempt to quit: 06/07/2005  . Smokeless tobacco: Never Used  . Alcohol use 2.4 oz/week    2 Glasses of wine, 2 Cans of beer per week     Comment: occasional    Allergies  Allergen Reactions  . Vicodin [Hydrocodone-Acetaminophen] Hives and Rash    Current Outpatient Prescriptions  Medication Sig Dispense Refill  . clonazePAM (KLONOPIN) 0.5 MG tablet Take 1 tablet (0.5 mg total) by mouth 2 (two) times daily. 30 tablet 2  . escitalopram (LEXAPRO) 10 MG tablet Take 1 tablet (10 mg total) by mouth daily. 90 tablet 2  . lisinopril-hydrochlorothiazide (PRINZIDE,ZESTORETIC) 20-25 MG tablet Take 1 tablet by mouth daily. 90 tablet 4  . metoprolol succinate (TOPROL-XL) 25 MG 24 hr tablet Take 0.5 tablets (12.5 mg total) by mouth daily. 45 tablet 4  . ranitidine (ZANTAC) 150 MG tablet take 1 tablet by mouth every morning 30 tablet 2  . sulfamethoxazole-trimethoprim (BACTRIM DS,SEPTRA DS) 800-160 MG tablet Take 1 tablet by mouth 2 (two) times daily. 28 tablet 0   No current facility-administered medications for this visit.     Review of Systems Review of  Systems  Constitutional: Negative.   Respiratory: Negative.   Cardiovascular: Negative.   Skin: Negative.     Blood pressure (!) 148/88, pulse 72, resp. rate 12, height 5\' 6"  (1.676 m), weight 121 lb (54.9 kg).  Physical Exam Physical Exam  Constitutional: She is oriented to person, place, and time. She appears well-developed and well-nourished.  Eyes: Conjunctivae are normal. No scleral icterus.  Neck: Neck supple.  Pulmonary/Chest: Right breast exhibits no inverted nipple, no mass, no nipple discharge, no skin change and no tenderness. Left breast exhibits no inverted nipple, no mass, no nipple discharge, no skin change and no tenderness.  Abdominal: There is no tenderness.  Lymphadenopathy:    She has no cervical adenopathy.    She has no axillary adenopathy.  Neurological: She is alert and  oriented to person, place, and time.  Skin: Skin is warm and dry.  Psychiatric: She has a normal mood and affect.    Data Reviewed Prior notes,recent mammogram reviewed -no findings  Assessment    Mastitis of Right Breast, resolved. Stable exam     Plan  Advised to call here if there is any recurrence of breast symptoms Patient to return in one year screening mammogram This information has been scribed by Deborah Koch CMA.   PCP: Brayton Mars, MD   Deborah Koch 07/18/2016, 10:56 AM

## 2016-07-25 ENCOUNTER — Encounter: Payer: Self-pay | Admitting: Obstetrics and Gynecology

## 2016-07-25 ENCOUNTER — Ambulatory Visit: Payer: No Typology Code available for payment source | Admitting: Psychiatry

## 2016-07-27 NOTE — Progress Notes (Deleted)
ANNUAL PREVENTATIVE CARE GYN  ENCOUNTER NOTE  Subjective:       Deborah Koch is a 60 y.o. No obstetric history on file. female here for a routine annual gynecologic exam.  Current complaints: 1.  none    Gynecologic History No LMP recorded. Patient is postmenopausal. Contraception: BSO Last Pap: 03/11/2015 neg/neg Results were: normal Last mammogram: 07/13/2016 dx mammo birad 1. Results were: normal  Obstetric History OB History  No data available    Past Medical History:  Diagnosis Date  . Anxiety   . C. difficile colitis    a. 07/2014.  [name] Congenital bicuspid aortic valve   . Depression   . Dilated Ascending Aorta    a. 12/2013 Echo: 4.45cm.  . Diverticulitis   . GERD (gastroesophageal reflux disease)   . H/O mastitis 2015  . History of tobacco abuse    a. 20 yrs, 1/4 ppd - quit.  [name] HTN (hypertension)   . Hypokalemia   . Moderate to severe aortic stenosis    a. 12/2013 Echo: EF 60-65%, Grade 1 DD, bicuspid AoV with mod-sev AS [16mmHg mean gradient, AoV area 0.69cm^2 (VTI)], mod dil ascending Ao - 4.45cm, mild TR, PASP 62mmHg.    Past Surgical History:  Procedure Laterality Date  . BARTHOLIN GLAND CYST EXCISION    . BREAST BIOPSY Left    neg  . COLONOSCOPY  2013   Dr. Allen Norris  . OVARY SURGERY      Current Outpatient Prescriptions on File Prior to Visit  Medication Sig Dispense Refill  . clonazePAM (KLONOPIN) 0.5 MG tablet Take 1 tablet (0.5 mg total) by mouth 2 (two) times daily. 30 tablet 2  . escitalopram (LEXAPRO) 10 MG tablet Take 1 tablet (10 mg total) by mouth daily. 90 tablet 2  . lisinopril-hydrochlorothiazide (PRINZIDE,ZESTORETIC) 20-25 MG tablet Take 1 tablet by mouth daily. 90 tablet 4  . metoprolol succinate (TOPROL-XL) 25 MG 24 hr tablet Take 0.5 tablets (12.5 mg total) by mouth daily. 45 tablet 4  . ranitidine (ZANTAC) 150 MG tablet take 1 tablet by mouth every morning 30 tablet 2  . sulfamethoxazole-trimethoprim (BACTRIM DS,SEPTRA DS) 800-160 MG  tablet Take 1 tablet by mouth 2 (two) times daily. 28 tablet 0   No current facility-administered medications on file prior to visit.     Allergies  Allergen Reactions  . Vicodin [Hydrocodone-Acetaminophen] Hives and Rash    Social History   Social History  . Marital status: Married    Spouse name: N/A  . Number of children: N/A  . Years of education: N/A   Occupational History  . Not on file.   Social History Main Topics  . Smoking status: Current Every Day Smoker    Packs/day: 0.25    Years: 20.00    Types: Cigarettes    Last attempt to quit: 06/07/2005  . Smokeless tobacco: Never Used  . Alcohol use 2.4 oz/week    2 Glasses of wine, 2 Cans of beer per week     Comment: occasional  . Drug use: No  . Sexual activity: Yes    Birth control/ protection: None   Other Topics Concern  . Not on file   Social History Narrative  . No narrative on file    Family History  Problem Relation Age of Onset  . Hypertension Mother   . Colon cancer Mother   . Anxiety disorder Mother   . Depression Mother   . Heart failure Father   . Hypertension Father   .  Depression Sister   . Anxiety disorder Sister   . Alcohol abuse Brother   . Schizophrenia Brother   . Breast cancer Maternal Aunt   . Heart attack Neg Hx   . Stroke Neg Hx     The following portions of the patient's history were reviewed and updated as appropriate: allergies, current medications, past family history, past medical history, past social history, past surgical history and problem list.  Review of Systems ROS Review of Systems - General ROS: negative for - chills, fatigue, fever, hot flashes, night sweats, weight gain or weight loss Psychological ROS: negative for - anxiety, decreased libido, depression, mood swings, physical abuse or sexual abuse Ophthalmic ROS: negative for - blurry vision, eye pain or loss of vision ENT ROS: negative for - headaches, hearing change, visual changes or vocal  changes Allergy and Immunology ROS: negative for - hives, itchy/watery eyes or seasonal allergies Hematological and Lymphatic ROS: negative for - bleeding problems, bruising, swollen lymph nodes or weight loss Endocrine ROS: negative for - galactorrhea, hair pattern changes, hot flashes, malaise/lethargy, mood swings, palpitations, polydipsia/polyuria, skin changes, temperature intolerance or unexpected weight changes Breast ROS: negative for - new or changing breast lumps or nipple discharge Respiratory ROS: negative for - cough or shortness of breath Cardiovascular ROS: negative for - chest pain, irregular heartbeat, palpitations or shortness of breath Gastrointestinal ROS: no abdominal pain, change in bowel habits, or black or bloody stools Genito-Urinary ROS: no dysuria, trouble voiding, or hematuria Musculoskeletal ROS: negative for - joint pain or joint stiffness Neurological ROS: negative for - bowel and bladder control changes Dermatological ROS: negative for rash and skin lesion changes   Objective:   There were no vitals taken for this visit. CONSTITUTIONAL: Well-developed, well-nourished female in no acute distress.  PSYCHIATRIC: Normal mood and affect. Normal behavior. Normal judgment and thought content. [place]: Alert and oriented to person, place, and time. Normal muscle tone coordination. No cranial nerve deficit noted. HENT:  Normocephalic, atraumatic, External right and left ear normal. Oropharynx is clear and moist EYES: Conjunctivae and EOM are normal. Pupils are equal, round, and reactive to light. No scleral icterus.  NECK: Normal range of motion, supple, no masses.  Normal thyroid.  SKIN: Skin is warm and dry. No rash noted. Not diaphoretic. No erythema. No pallor. CARDIOVASCULAR: Normal heart rate noted, regular rhythm, no murmur. RESPIRATORY: Clear to auscultation bilaterally. Effort and breath sounds normal, no problems with respiration noted. BREASTS: Symmetric  in size. No masses, skin changes, nipple drainage, or lymphadenopathy. ABDOMEN: Soft, normal bowel sounds, no distention noted.  No tenderness, rebound or guarding.  BLADDER: Normal PELVIC:  External Genitalia: Normal  BUS: Normal  Vagina: Normal  Cervix: Normal  Uterus: Normal  Adnexa: Normal  RV: {Blank multiple:19196::"External Exam NormaI","No Rectal Masses","Normal Sphincter tone"}  MUSCULOSKELETAL: Normal range of motion. No tenderness.  No cyanosis, clubbing, or edema.  2+ distal pulses. LYMPHATIC: No Axillary, Supraclavicular, or Inguinal Adenopathy.    Assessment:   Annual gynecologic examination 60 y.o. Contraception: BSO Normal BMI Problem List Items Addressed This Visit    None    Visit Diagnoses    Well woman exam with routine gynecological exam    -  Primary   Screen for colon cancer          Plan:  Pap: due 02/2018 Mammogram: Due 07/14/2017  Stool Guaiac Testing:  Ordered Labs:Thru pcp Routine preventative health maintenance measures emphasized: {Blank multiple:19196::"Exercise/Diet/Weight control","Tobacco Warnings","Alcohol/Substance use risks","Stress Management","Peer Pressure Issues","Safe Sex"} *** Return  to [facility] [place], [place]

## 2016-08-01 ENCOUNTER — Encounter: Payer: Self-pay | Admitting: Obstetrics and Gynecology

## 2016-08-03 ENCOUNTER — Encounter: Payer: Self-pay | Admitting: Psychiatry

## 2016-08-03 ENCOUNTER — Ambulatory Visit (INDEPENDENT_AMBULATORY_CARE_PROVIDER_SITE_OTHER): Payer: BLUE CROSS/BLUE SHIELD | Admitting: Psychiatry

## 2016-08-03 VITALS — BP 161/103 | HR 89 | Temp 98.3°F | Ht 66.0 in | Wt 129.4 lb

## 2016-08-03 DIAGNOSIS — F331 Major depressive disorder, recurrent, moderate: Secondary | ICD-10-CM | POA: Diagnosis not present

## 2016-08-03 DIAGNOSIS — F411 Generalized anxiety disorder: Secondary | ICD-10-CM

## 2016-08-03 MED ORDER — RANITIDINE HCL 150 MG PO TABS: 150.0000 mg | ORAL_TABLET | Freq: Every morning | ORAL | 2 refills | Status: DC

## 2016-08-03 MED ORDER — CLONAZEPAM 0.5 MG PO TABS: 0.5000 mg | ORAL_TABLET | Freq: Two times a day (BID) | ORAL | 2 refills | Status: DC

## 2016-08-03 NOTE — Progress Notes (Signed)
BH MD/PA/NP OP Progress Note  08/03/2016 10:25 AM Deborah Koch  MRN:  [id number]  Subjective:    Pt is a 60 yo married female presented for follow up.  She reported that she is worried about her brother who has history of cirrhosis of the liver and he has just relocated to live with her sister. She reported that the home health nurse has just called them and told them to take him to do this morning as he has been having bleeding from his rectum. She is very much worried about him and stated that she has to rush him with her sister. She  reported that he is currently living with her sister in [place] area. He was living by himself in [place]. She was talking at length about her brother. Patient reported that she [place] and came back as her mother fell again. She has been actively involved in her family members. She appeared calm and anxious during the interview. She reported the medications are helping her and she is taking Klonopin on a twice-daily basis. She is willing to decrease the dose of Lexapro but due to the situations with the family members she wants to continue the medications as prescribed. She denied having any suicidal homicidal ideations or plans. She appeared pleasant and cooperative.     Chief Complaint:  Chief Complaint    Follow-up; Medication Refill; Stress     Visit Diagnosis:     ICD-9-CM ICD-10-CM   1. MDD (major depressive disorder), recurrent episode, moderate (HCC) 296.32 F33.1   2. GAD (generalized anxiety disorder) 300.02 F41.1     Past Medical History:  Past Medical History:  Diagnosis Date  . Anxiety   . C. difficile colitis    a. 07/2014.  [name] Congenital bicuspid aortic valve   . Depression   . Dilated Ascending Aorta    a. 12/2013 Echo: 4.45cm.  . Diverticulitis   . GERD (gastroesophageal reflux disease)   . H/O mastitis 2015  . History of tobacco abuse    a. 20 yrs, 1/4 ppd - quit.   [name] HTN (hypertension)   . Hypokalemia   . Moderate to severe aortic stenosis    a. 12/2013 Echo: EF 60-65%, Grade 1 DD, bicuspid AoV with mod-sev AS [28mmHg mean gradient, AoV area 0.69cm^2 (VTI)], mod dil ascending Ao - 4.45cm, mild TR, PASP 63mmHg.    Past Surgical History:  Procedure Laterality Date  . BARTHOLIN GLAND CYST EXCISION    . BREAST BIOPSY Left    neg  . COLONOSCOPY  2013   Dr. Allen Norris  . OVARY SURGERY     Family History:  Family History  Problem Relation Age of Onset  . Hypertension Mother   . Colon cancer Mother   . Anxiety disorder Mother   . Depression Mother   . Heart failure Father   . Hypertension Father   . Depression Sister   . Anxiety disorder Sister   . Alcohol abuse Brother   . Schizophrenia Brother   . Breast cancer Maternal Aunt   . Heart attack Neg Hx   . Stroke Neg Hx    Social History:  Social History   Social History  . Marital status: Married    Spouse name: N/A  . Number of children: N/A  . Years of education: N/A   Social History Main Topics  . Smoking status: Current Every Day Smoker    Packs/day: 0.25  Years: 20.00    Types: Cigarettes    Last attempt to quit: 06/07/2005  . Smokeless tobacco: Never Used  . Alcohol use 2.4 oz/week    2 Glasses of wine, 2 Cans of beer per week     Comment: occasional  . Drug use: No  . Sexual activity: Yes    Birth control/ protection: None   Other Topics Concern  . None   Social History Narrative  . None   Additional History:   Patient currently lives with her husband and they have been married for the past 4 years   Assessment:   Musculoskeletal: Strength & Muscle Tone: within normal limits Gait & Station: normal Patient leans: N/A  Psychiatric Specialty Exam: Anxiety  Symptoms include nervous/anxious behavior.    Medication Refill     Review of Systems  Musculoskeletal: Positive for back pain and joint pain.  Psychiatric/Behavioral: Positive for depression. The  patient is nervous/anxious.   All other systems reviewed and are negative.   Blood pressure (!) 161/103, pulse 89, temperature 98.3 F (36.8 C), temperature source Oral, height 5\' 6"  (1.676 m), weight 129 lb 6.4 oz (58.7 kg).Body mass index is 20.89 kg/m.  General Appearance: Casual  Eye Contact:  Fair  Speech:  Clear and Coherent  Volume:  Normal  Mood:  Euthymic  Affect:  Congruent  Thought Process:  Coherent  Orientation:  Full (Time, Place, and Person)  Thought Content:  WDL  Suicidal Thoughts:  No  Homicidal Thoughts:  No  Memory:  NA  Judgement:  Fair  Insight:  Fair  Psychomotor Activity:  Normal  Concentration:  Fair  Recall:  [employer] of Knowledge: Fair  Language: Fair  Akathisia:  No  Handed:  Right  AIMS (if indicated):    Assets:  Communication Skills Desire for Improvement Social Support  ADL's:  Intact  Cognition: WNL  Sleep:      Current Medications: Current Outpatient Prescriptions  Medication Sig Dispense Refill  . clonazePAM (KLONOPIN) 0.5 MG tablet Take 1 tablet (0.5 mg total) by mouth 2 (two) times daily. 60 tablet 2  . escitalopram (LEXAPRO) 10 MG tablet Take 1 tablet (10 mg total) by mouth daily. 90 tablet 2  . lisinopril-hydrochlorothiazide (PRINZIDE,ZESTORETIC) 20-25 MG tablet Take 1 tablet by mouth daily. 90 tablet 4  . metoprolol succinate (TOPROL-XL) 25 MG 24 hr tablet Take 0.5 tablets (12.5 mg total) by mouth daily. 45 tablet 4  . ranitidine (ZANTAC) 150 MG tablet Take 1 tablet (150 mg total) by mouth every morning. 90 tablet 2   No current facility-administered medications for this visit.     Medical Decision Making:  Review of Psycho-Social Stressors (1) and Established Problem, Worsening (2)  Treatment Plan Summary:Medication management   Depression  She will continue on Lexapro 10 mg daily   Anxiety  Klonopin 0.5 mg by mouth twice a day   Patient will follow up in 3 month or earlier depending on her symptoms.   More than  50% of the time spent in psychoeducation, counseling and coordination of care.  Time spent with the patient 25 minutes   This note was generated in part or whole with voice recognition software. Voice regonition is usually quite accurate but there are transcription errors that can and very often do occur. I apologize for any typographical errors that were not detected and corrected.    Rainey Pines, MD  08/03/2016, 10:25 AM

## 2016-09-04 NOTE — Progress Notes (Signed)
ANNUAL PREVENTATIVE CARE GYN  ENCOUNTER NOTE  Subjective:       Deborah Koch is a 60 y.o.  G2 P69  female here for a routine annual gynecologic exam.  Current complaints: 1.   None 2. Family history of liver cancer-Brother    Gynecologic History No LMP recorded. Patient is postmenopausal. Contraception: menopause Last Pap 02/2015 neg/neg. Results were: normal Last mammogram: 06/2016 birad 1. Results were: normal Status post BSO  Obstetric History OB History  No data available    Past Medical History:  Diagnosis Date  . Anxiety   . C. difficile colitis    a. 07/2014.  [name] Congenital bicuspid aortic valve   . Depression   . Dilated Ascending Aorta    a. 12/2013 Echo: 4.45cm.  . Diverticulitis   . GERD (gastroesophageal reflux disease)   . H/O mastitis 2015  . History of tobacco abuse    a. 20 yrs, 1/4 ppd - quit.  [name] HTN (hypertension)   . Hypokalemia   . Moderate to severe aortic stenosis    a. 12/2013 Echo: EF 60-65%, Grade 1 DD, bicuspid AoV with mod-sev AS [64mmHg mean gradient, AoV area 0.69cm^2 (VTI)], mod dil ascending Ao - 4.45cm, mild TR, PASP 74mmHg.    Past Surgical History:  Procedure Laterality Date  . BARTHOLIN GLAND CYST EXCISION    . BREAST BIOPSY Left    neg  . COLONOSCOPY  2013   Dr. Allen Norris  . OVARY SURGERY      Current Outpatient Prescriptions on File Prior to Visit  Medication Sig Dispense Refill  . clonazePAM (KLONOPIN) 0.5 MG tablet Take 1 tablet (0.5 mg total) by mouth 2 (two) times daily. 60 tablet 2  . escitalopram (LEXAPRO) 10 MG tablet Take 1 tablet (10 mg total) by mouth daily. 90 tablet 2  . lisinopril-hydrochlorothiazide (PRINZIDE,ZESTORETIC) 20-25 MG tablet Take 1 tablet by mouth daily. 90 tablet 4  . metoprolol succinate (TOPROL-XL) 25 MG 24 hr tablet Take 0.5 tablets (12.5 mg total) by mouth daily. 45 tablet 4  . ranitidine (ZANTAC) 150 MG tablet Take 1 tablet (150 mg total) by mouth every morning. 90 tablet 2   No current  facility-administered medications on file prior to visit.     Allergies  Allergen Reactions  . Vicodin [Hydrocodone-Acetaminophen] Hives and Rash    Social History   Social History  . Marital status: Married    Spouse name: N/A  . Number of children: N/A  . Years of education: N/A   Occupational History  . Not on file.   Social History Main Topics  . Smoking status: Current Every Day Smoker    Packs/day: 0.25    Years: 20.00    Types: Cigarettes    Last attempt to quit: 06/07/2005  . Smokeless tobacco: Never Used  . Alcohol use 2.4 oz/week    2 Glasses of wine, 2 Cans of beer per week     Comment: occasional  . Drug use: No  . Sexual activity: Yes    Birth control/ protection: None   Other Topics Concern  . Not on file   Social History Narrative  . No narrative on file    Family History  Problem Relation Age of Onset  . Hypertension Mother   . Colon cancer Mother   . Anxiety disorder Mother   . Depression Mother   . Heart failure Father   . Hypertension Father   . Depression Sister   . Anxiety disorder Sister   .  Alcohol abuse Brother   . Schizophrenia Brother   . Breast cancer Maternal Aunt   . Heart attack Neg Hx   . Stroke Neg Hx     The following portions of the patient's history were reviewed and updated as appropriate: allergies, current medications, past family history, past medical history, past social history, past surgical history and problem list.  Review of Systems ROS Review of Systems - General ROS: negative for - chills, fatigue, fever, hot flashes, night sweats, weight gain or weight loss Psychological ROS: negative for - anxiety, decreased libido, depression, mood swings, physical abuse or sexual abuse Ophthalmic ROS: negative for - blurry vision, eye pain or loss of vision ENT ROS: negative for - headaches, hearing change, visual changes or vocal changes Allergy and Immunology ROS: negative for - hives, itchy/watery eyes or seasonal  allergies Hematological and Lymphatic ROS: negative for - bleeding problems, bruising, swollen lymph nodes or weight loss Endocrine ROS: negative for - galactorrhea, hair pattern changes, hot flashes, malaise/lethargy, mood swings, palpitations, polydipsia/polyuria, skin changes, temperature intolerance or unexpected weight changes Breast ROS: negative for - new or changing breast lumps or nipple discharge Respiratory ROS: negative for - cough or shortness of breath Cardiovascular ROS: negative for - chest pain, irregular heartbeat, palpitations or shortness of breath Gastrointestinal ROS: no abdominal pain, change in bowel habits, or black or bloody stools Genito-Urinary ROS: no dysuria, trouble voiding, or hematuria Musculoskeletal ROS: negative for - joint pain or joint stiffness Neurological ROS: negative for - bowel and bladder control changes Dermatological ROS: negative for rash and skin lesion changes   Objective:   BP (!) 151/91   Pulse 75   Ht 5\' 6"  (1.676 m)   Wt 130 lb 1.6 oz (59 kg)   BMI 21.00 kg/m  CONSTITUTIONAL: Well-developed, well-nourished female in no acute distress.  PSYCHIATRIC: Normal mood and affect. Normal behavior. Normal judgment and thought content. [place]: Alert and oriented to person, place, and time. Normal muscle tone coordination. No cranial nerve deficit noted. HENT:  Normocephalic, atraumatic, External right and left ear normal. Oropharynx is clear and moist EYES: Conjunctivae and EOM are normal.No scleral icterus.  NECK: Normal range of motion, supple, no masses.  Normal thyroid.  SKIN: Skin is warm and dry. No rash noted. Not diaphoretic. No erythema. No pallor. CARDIOVASCULAR: Normal heart rate noted, regular rhythm; systolic ejection murmur with click RESPIRATORY: Clear to auscultation bilaterally. Effort and breath sounds normal, no problems with respiration noted. BREASTS: Symmetric in size. No masses, skin changes, nipple drainage, or  lymphadenopathy. Well-healed left breast biopsy site,outer quadrant ABDOMEN: Soft, normal bowel sounds, no distention noted.  No tenderness, rebound or guarding.  BLADDER: Normal PELVIC:  External Genitalia: Normal  BUS: Normal  Vagina: Normal  Cervix: Normal; no cervical motion tenderness  Uterus: Normal; midplane, normal size and shape, mobile, nontender  Adnexa: Normal; nonpalpable and nontender  RV: External Exam NormaI, No Rectal Masses and Normal Sphincter tone  MUSCULOSKELETAL: Normal range of motion. No tenderness.  No cyanosis, clubbing, or edema.  2+ distal pulses. LYMPHATIC: No Axillary, Supraclavicular, or Inguinal Adenopathy.    Assessment:   Annual gynecologic examination 60 y.o. Contraception: post menopausal status Normal BMI Problem List Items Addressed This Visit    S/P BSO (status post bilateral salpingo-oophorectomy)    Other Visit Diagnoses    Well woman exam with routine gynecological exam    -  Primary   Screening for breast cancer       Screen for colon  cancer         Family history liver cancer-Brother Plan:  Pap: due 2019 Mammogram:utd Stool Guaiac Testing:  Decline- colonoscopy q 5 years due 2018 Labs:lipid vit d a1c fbs tsh Routine preventative health maintenance measures emphasized: Exercise/Diet/Weight control, Tobacco Warnings and Alcohol/Substance use risks Return to Frannie, CMA  Brayton Mars, MD  Note: This dictation was prepared with Dragon dictation along with smaller phrase technology. Any transcriptional errors that result from this process are unintentional.

## 2016-09-05 ENCOUNTER — Ambulatory Visit (INDEPENDENT_AMBULATORY_CARE_PROVIDER_SITE_OTHER): Payer: BLUE CROSS/BLUE SHIELD | Admitting: Obstetrics and Gynecology

## 2016-09-05 ENCOUNTER — Encounter: Payer: Self-pay | Admitting: Obstetrics and Gynecology

## 2016-09-05 VITALS — BP 151/91 | HR 75 | Ht 66.0 in | Wt 130.1 lb

## 2016-09-05 DIAGNOSIS — Z1239 Encounter for other screening for malignant neoplasm of breast: Secondary | ICD-10-CM | POA: Diagnosis not present

## 2016-09-05 DIAGNOSIS — Z01419 Encounter for gynecological examination (general) (routine) without abnormal findings: Secondary | ICD-10-CM | POA: Diagnosis not present

## 2016-09-05 DIAGNOSIS — Z1211 Encounter for screening for malignant neoplasm of colon: Secondary | ICD-10-CM | POA: Diagnosis not present

## 2016-09-05 DIAGNOSIS — Z800 Family history of malignant neoplasm of digestive organs: Secondary | ICD-10-CM | POA: Diagnosis not present

## 2016-09-05 DIAGNOSIS — Z90722 Acquired absence of ovaries, bilateral: Secondary | ICD-10-CM

## 2016-09-05 NOTE — Patient Instructions (Signed)
1. No Pap smear is done. 2. Mammogram already obtained 3. Stool guaiac cards are given for colon cancer screening 4. Screening lab work is to be done at the [facility] 5. Continue with healthy eating and exercise 6. Continue with calcium supplementation 1200 mg a day and vitamin D supplementation 1000 units a day 7. Return in 1 year

## 2016-10-09 ENCOUNTER — Ambulatory Visit (INDEPENDENT_AMBULATORY_CARE_PROVIDER_SITE_OTHER): Payer: BLUE CROSS/BLUE SHIELD | Admitting: Family

## 2016-10-09 ENCOUNTER — Encounter: Payer: Self-pay | Admitting: Family

## 2016-10-09 VITALS — BP 138/88 | HR 66 | Temp 98.1°F | Ht 66.0 in | Wt 130.2 lb

## 2016-10-09 DIAGNOSIS — F419 Anxiety disorder, unspecified: Secondary | ICD-10-CM

## 2016-10-09 DIAGNOSIS — K219 Gastro-esophageal reflux disease without esophagitis: Secondary | ICD-10-CM

## 2016-10-09 DIAGNOSIS — I10 Essential (primary) hypertension: Secondary | ICD-10-CM

## 2016-10-09 DIAGNOSIS — Z0000 Encounter for general adult medical examination without abnormal findings: Secondary | ICD-10-CM | POA: Insufficient documentation

## 2016-10-09 DIAGNOSIS — F411 Generalized anxiety disorder: Secondary | ICD-10-CM | POA: Diagnosis not present

## 2016-10-09 DIAGNOSIS — Z7689 Persons encountering health services in other specified circumstances: Secondary | ICD-10-CM | POA: Diagnosis not present

## 2016-10-09 DIAGNOSIS — F325 Major depressive disorder, single episode, in full remission: Secondary | ICD-10-CM

## 2016-10-09 DIAGNOSIS — I350 Nonrheumatic aortic (valve) stenosis: Secondary | ICD-10-CM

## 2016-10-09 LAB — COMPREHENSIVE METABOLIC PANEL
ALT: 12 U/L (ref 0–35)
AST: 15 U/L (ref 0–37)
Albumin: 4.7 g/dL (ref 3.5–5.2)
Alkaline Phosphatase: 75 U/L (ref 39–117)
BUN: 14 mg/dL (ref 6–23)
CO2: 31 mEq/L (ref 19–32)
Calcium: 10 mg/dL (ref 8.4–10.5)
Chloride: 94 mEq/L — ABNORMAL LOW (ref 96–112)
Creatinine, Ser: 0.67 mg/dL (ref 0.40–1.20)
GFR: 94.99 mL/min (ref >60.00)
Glucose, Bld: 99 mg/dL (ref 70–99)
Potassium: 3.7 mEq/L (ref 3.5–5.1)
Sodium: 131 mEq/L — ABNORMAL LOW (ref 135–145)
Total Bilirubin: 0.5 mg/dL (ref 0.2–1.2)
Total Protein: 7.6 g/dL (ref 6.0–8.3)

## 2016-10-09 LAB — LIPID PANEL
Cholesterol: 194 mg/dL (ref 0–200)
HDL: 89 mg/dL (ref >39.00)
LDL Cholesterol: 90 mg/dL (ref 0–99)
NonHDL: 104.62
Total CHOL/HDL Ratio: 2
Triglycerides: 72 mg/dL (ref 0.0–149.0)
VLDL: 14.4 mg/dL (ref 0.0–40.0)

## 2016-10-09 LAB — CBC WITH DIFFERENTIAL/PLATELET
Basophils Absolute: 0 10*3/uL (ref 0.0–0.1)
Basophils Relative: 0.7 % (ref 0.0–3.0)
Eosinophils Absolute: 0.1 10*3/uL (ref 0.0–0.7)
Eosinophils Relative: 2 % (ref 0.0–5.0)
HCT: 38.9 % (ref 36.0–46.0)
Hemoglobin: 13.2 g/dL (ref 12.0–15.0)
Lymphocytes Relative: 22.6 % (ref 12.0–46.0)
Lymphs Abs: 1.3 10*3/uL (ref 0.7–4.0)
MCHC: 33.8 g/dL (ref 30.0–36.0)
MCV: 95.3 fl (ref 78.0–100.0)
Monocytes Absolute: 0.5 10*3/uL (ref 0.1–1.0)
Monocytes Relative: 8.1 % (ref 3.0–12.0)
Neutro Abs: 3.9 10*3/uL (ref 1.4–7.7)
Neutrophils Relative %: 66.6 % (ref 43.0–77.0)
Platelets: 386 10*3/uL (ref 150.0–400.0)
RBC: 4.09 Mil/uL (ref 3.87–5.11)
RDW: 13.7 % (ref 11.5–15.5)
WBC: 5.8 10*3/uL (ref 4.0–10.5)

## 2016-10-09 LAB — TSH: TSH: 1.89 u[IU]/mL (ref 0.35–4.50)

## 2016-10-09 LAB — HEMOGLOBIN A1C: Hgb A1c MFr Bld: 5.3 % (ref 4.6–6.5)

## 2016-10-09 LAB — VITAMIN D 25 HYDROXY (VIT D DEFICIENCY, FRACTURES): VITD: 40.14 ng/mL (ref 30.00–100.00)

## 2016-10-09 MED ORDER — CLONAZEPAM 0.5 MG PO TABS: 0.5000 mg | ORAL_TABLET | Freq: Two times a day (BID) | ORAL | 0 refills | Status: DC

## 2016-10-09 NOTE — Progress Notes (Signed)
Pre visit review using our clinic review tool, if applicable. No additional management support is needed unless otherwise documented below in the visit note. 

## 2016-10-09 NOTE — Assessment & Plan Note (Addendum)
Discussed risks of BZDs and patient agreed with long term plan of decreasing/discontinuing. She will start taking 1/2 tab klonopin in morning and then try to stop taking tab in morning all together. Discussed increasing lexapro as well at future visits. Refilled. Assessed [place] Controlled Substance Reporting System and did not see any activity at this time under patient's name and address in Epic. No concern for abuse as this time. New CSC.

## 2016-10-09 NOTE — Assessment & Plan Note (Signed)
Stable  Continue current regimen  

## 2016-10-09 NOTE — Assessment & Plan Note (Signed)
Reviewed past medical surgical and social history and family hx with patient. Ordered screening labs that were not done at recent physical with OB/GYN. Patient will follow-up in 3 months.

## 2016-10-09 NOTE — Patient Instructions (Signed)
As we discussed,  start taking half tablet Klonopin in the morning. When you tolerate this for a week or 2, you can start omitting the Klonopin tab in the morning altogether. Follow-up in 3 months.

## 2016-10-09 NOTE — Assessment & Plan Note (Signed)
At goal. Will continue current regimen.  

## 2016-10-09 NOTE — Progress Notes (Signed)
Subjective:    Patient ID: Deborah Koch, female    DOB: 1956/09/15, 60 y.o.   MRN: [id number]  CC: Deborah Koch is a 60 y.o. female who presents today To establish care.  HPI: Patient here to establish care as new patient. Prior care had been with OB GYN and we are requesting those records.  HTN- Complaint with medication. Denies exertional chest pain or pressure, numbness or tingling radiating to left arm or jaw, palpitations, dizziness, frequent headaches, changes in vision, or shortness of breath.   H/o of congenital bicuspid valve and murmur. Follows with Dr. Rockey Situ Cardiology annually.   GERD- controlled on ranitidine; No hoarseness, cough, difficultly swallowing.  Depression/Anxiety- Stable. Had followed with Dr. Gretel Acre for past year however would like to establish care with me.  Takes klonopin in the morning and at night. Has been klonopin for 20 years and started medication with after ex husband had drug problems and she was a single mother. No panic attacks. Feels anxiety is better than it has in the past. Walking and yoga help  Had PE with Dr. Enzo Bi for OB GYN. 08/2016. Requesting labs as not done during recent exam.  Due for Pap 2018 and also colonoscopy 2018 per patient. Had mammogram this year which was normal per patient.      HISTORY:  Past Medical History:  Diagnosis Date  . Anxiety   . C. difficile colitis    a. 07/2014.  [name] Congenital bicuspid aortic valve   . Depression   . Dilated Ascending Aorta    a. 12/2013 Echo: 4.45cm.  . Diverticulitis   . GERD (gastroesophageal reflux disease)   . H/O mastitis 2015  . History of tobacco abuse    a. 20 yrs, 1/4 ppd - quit.  [name] HTN (hypertension)   . Hypokalemia   . Moderate to severe aortic stenosis    a. 12/2013 Echo: EF 60-65%, Grade 1 DD, bicuspid AoV with mod-sev AS [69mmHg mean gradient, AoV area 0.69cm^2 (VTI)], mod dil ascending Ao - 4.45cm, mild TR, PASP 41mmHg.   Past Surgical History:    Procedure Laterality Date  . BARTHOLIN GLAND CYST EXCISION    . BREAST BIOPSY Left    neg  . COLONOSCOPY  2013   Dr. Allen Norris  . OOPHORECTOMY  2016  . OVARY SURGERY  2016   cyst on right ovary   Family History  Problem Relation Age of Onset  . Hypertension Mother   . Colon cancer Mother   . Anxiety disorder Mother   . Depression Mother   . Heart failure Father   . Hypertension Father   . Depression Sister   . Anxiety disorder Sister   . Alcohol abuse Brother   . Schizophrenia Brother   . Breast cancer Maternal Aunt   . Heart attack Neg Hx   . Stroke Neg Hx     Allergies: Vicodin [hydrocodone-acetaminophen] Current Outpatient Prescriptions on File Prior to Visit  Medication Sig Dispense Refill  . escitalopram (LEXAPRO) 10 MG tablet Take 1 tablet (10 mg total) by mouth daily. 90 tablet 2  . lisinopril-hydrochlorothiazide (PRINZIDE,ZESTORETIC) 20-25 MG tablet Take 1 tablet by mouth daily. 90 tablet 4  . metoprolol succinate (TOPROL-XL) 25 MG 24 hr tablet Take 0.5 tablets (12.5 mg total) by mouth daily. 45 tablet 4  . ranitidine (ZANTAC) 150 MG tablet Take 1 tablet (150 mg total) by mouth every morning. 90 tablet 2   No current facility-administered medications on file prior to visit.  Social History  Substance Use Topics  . Smoking status: Current Every Day Smoker    Packs/day: 0.25    Years: 20.00    Types: Cigarettes    Last attempt to quit: 06/07/2005  . Smokeless tobacco: Never Used  . Alcohol use 2.4 oz/week    2 Glasses of wine, 2 Cans of beer per week     Comment: occasional    Review of Systems  Constitutional: Negative for chills and fever.  Eyes: Negative for visual disturbance.  Respiratory: Negative for cough.   Cardiovascular: Negative for chest pain and palpitations.  Gastrointestinal: Negative for nausea and vomiting.  Psychiatric/Behavioral: The patient is nervous/anxious.       Objective:    BP 138/88   Pulse 66   Temp 98.1 F (36.7 C)  (Oral)   Ht 5\' 6"  (1.676 m)   Wt 130 lb 3.2 oz (59.1 kg)   SpO2 98%   BMI 21.01 kg/m  BP Readings from Last 3 Encounters:  10/09/16 138/88  09/05/16 (!) 151/91  08/03/16 (!) 161/103   Wt Readings from Last 3 Encounters:  10/09/16 130 lb 3.2 oz (59.1 kg)  09/05/16 130 lb 1.6 oz (59 kg)  08/03/16 129 lb 6.4 oz (58.7 kg)    Physical Exam  Constitutional: She appears well-developed and well-nourished.  Eyes: Conjunctivae are normal.  Cardiovascular: Normal rate, regular rhythm and normal pulses.   Murmur heard.  Systolic murmur is present with a grade of 2/6  SEM II/VI, Loudest LSB, non radiating, no thrill   Pulmonary/Chest: Effort normal and breath sounds normal. She has no wheezes. She has no rhonchi. She has no rales.  Neurological: She is alert.  Skin: Skin is warm and dry.  Psychiatric: She has a normal mood and affect. Her speech is normal and behavior is normal. Thought content normal.  Vitals reviewed.      Assessment & Plan:   Problem List Items Addressed This Visit      Cardiovascular and Mediastinum   Essential hypertension    At goal. Will continue current regimen.       Relevant Orders   CBC with Differential/Platelet   Comprehensive metabolic panel   Hemoglobin A1c   Hepatitis C antibody   HIV antibody   Lipid panel   TSH   VITAMIN D 25 Hydroxy (Vit-D Deficiency, Fractures)   Moderate to severe aortic stenosis    Asymptomatic. Continues to follow with Dr Rockey Situ annually.        Digestive   GERD (gastroesophageal reflux disease)    Stable. Continue current regimen.        Other   Anxiety    Discussed risks of BZDs and patient agreed with long term plan of decreasing/discontinuing. She will start taking 1/2 tab klonopin in morning and then try to stop taking tab in morning all together. Discussed increasing lexapro as well at future visits. Refilled. Assessed [place] Controlled Substance Reporting System and did not see any activity at this time  under patient's name and address in Epic. No concern for abuse as this time. New CSC.       Clinical depression    Stable. Continue current regimen.      Encounter to establish care - Primary    Reviewed past medical surgical and social history and family hx with patient. Ordered screening labs that were not done at recent physical with OB/GYN. Patient will follow-up in 3 months.       Other Visit Diagnoses  Generalized anxiety disorder       Relevant Medications   clonazePAM (KLONOPIN) 0.5 MG tablet       I am having Deborah Koch maintain her lisinopril-hydrochlorothiazide, metoprolol succinate, escitalopram, ranitidine, and clonazePAM.   Meds ordered this encounter  Medications  . clonazePAM (KLONOPIN) 0.5 MG tablet    Sig: Take 1 tablet (0.5 mg total) by mouth 2 (two) times daily.    Dispense:  60 tablet    Refill:  0    Do not fill before 11/16.    Order Specific Question:   Supervising Provider    Answer:   Crecencio Mc [[id number]]    Return precautions given.   Risks, benefits, and alternatives of the medications and treatment plan prescribed today were discussed, and patient expressed understanding.   Education regarding symptom management and diagnosis given to patient on AVS.  Continue to follow with Brayton Mars, MD for routine health maintenance.   Deborah Koch and I agreed with plan.   Mable Paris, FNP  Total of 25 minutes spent with patient, greater than 50% of which was spent in discussion of  BZDs.

## 2016-10-09 NOTE — Assessment & Plan Note (Signed)
Asymptomatic. Continues to follow with Dr Rockey Situ annually.

## 2016-10-10 ENCOUNTER — Other Ambulatory Visit: Payer: Self-pay | Admitting: Family

## 2016-10-10 DIAGNOSIS — E871 Hypo-osmolality and hyponatremia: Secondary | ICD-10-CM

## 2016-10-10 LAB — HIV ANTIBODY (ROUTINE TESTING W REFLEX): HIV 1&2 Ab, 4th Generation: NONREACTIVE

## 2016-10-10 LAB — HEPATITIS C ANTIBODY: HCV Ab: NEGATIVE

## 2016-10-10 MED ORDER — LISINOPRIL 20 MG PO TABS: 20.0000 mg | ORAL_TABLET | Freq: Every day | ORAL | 3 refills | Status: DC

## 2016-12-01 ENCOUNTER — Other Ambulatory Visit: Payer: Self-pay | Admitting: Family

## 2016-12-01 DIAGNOSIS — F411 Generalized anxiety disorder: Secondary | ICD-10-CM

## 2016-12-04 ENCOUNTER — Telehealth: Payer: Self-pay | Admitting: *Deleted

## 2016-12-04 DIAGNOSIS — F411 Generalized anxiety disorder: Secondary | ICD-10-CM

## 2016-12-04 NOTE — Telephone Encounter (Signed)
Patient has requested a refill for klonopin  Pharmacy [facility]

## 2016-12-04 NOTE — Telephone Encounter (Signed)
Refill request for Klonopin, last seen 23OCT2017, last filled 23OCT2017.  Please advise.

## 2016-12-06 MED ORDER — CLONAZEPAM 0.5 MG PO TABS: 0.5000 mg | ORAL_TABLET | Freq: Two times a day (BID) | ORAL | 0 refills | Status: DC

## 2016-12-06 NOTE — Telephone Encounter (Signed)
Patient has not done those things.  Patient stated she will come pick up medication rx at front desk.  Also she will schedule appointment.

## 2016-12-06 NOTE — Telephone Encounter (Signed)
Call pt  At last visit we discussed -She will start taking 1/2 tab klonopin in morning and then try to stop taking tab in morning all together. Has she done this?    Discussed increasing lexapro as well at future visits. What are her thoughts about this?   Refilled. Please ensure she has a f/u scheduled with me

## 2016-12-06 NOTE — Telephone Encounter (Signed)
Pt called back in regards to medication. PT stated that if it cannot be done she needs to come in today for an appt. She has no more medication left.

## 2016-12-19 ENCOUNTER — Ambulatory Visit: Payer: BLUE CROSS/BLUE SHIELD | Admitting: Family

## 2016-12-21 ENCOUNTER — Ambulatory Visit: Payer: BLUE CROSS/BLUE SHIELD | Admitting: Family

## 2016-12-26 ENCOUNTER — Encounter: Payer: Self-pay | Admitting: Family

## 2016-12-26 ENCOUNTER — Ambulatory Visit
Admission: RE | Admit: 2016-12-26 | Discharge: 2016-12-26 | Disposition: A | Discharge status: Home / Self Care | Payer: Medicare Other | Source: Ambulatory Visit | Attending: Family | Admitting: Family

## 2016-12-26 ENCOUNTER — Ambulatory Visit (INDEPENDENT_AMBULATORY_CARE_PROVIDER_SITE_OTHER): Payer: Medicare Other | Admitting: Family

## 2016-12-26 ENCOUNTER — Telehealth: Payer: Self-pay | Admitting: Family

## 2016-12-26 VITALS — BP 166/100 | HR 80 | Temp 98.0°F | Ht 66.0 in | Wt 128.8 lb

## 2016-12-26 DIAGNOSIS — F32A Depression, unspecified: Secondary | ICD-10-CM

## 2016-12-26 DIAGNOSIS — I10 Essential (primary) hypertension: Secondary | ICD-10-CM

## 2016-12-26 DIAGNOSIS — K5730 Diverticulosis of large intestine without perforation or abscess without bleeding: Secondary | ICD-10-CM | POA: Diagnosis not present

## 2016-12-26 DIAGNOSIS — F419 Anxiety disorder, unspecified: Secondary | ICD-10-CM

## 2016-12-26 DIAGNOSIS — R1030 Lower abdominal pain, unspecified: Secondary | ICD-10-CM | POA: Insufficient documentation

## 2016-12-26 DIAGNOSIS — M5136 Other intervertebral disc degeneration, lumbar region: Secondary | ICD-10-CM | POA: Diagnosis not present

## 2016-12-26 DIAGNOSIS — I700 Atherosclerosis of aorta: Secondary | ICD-10-CM | POA: Insufficient documentation

## 2016-12-26 DIAGNOSIS — M47814 Spondylosis without myelopathy or radiculopathy, thoracic region: Secondary | ICD-10-CM | POA: Diagnosis not present

## 2016-12-26 DIAGNOSIS — F418 Other specified anxiety disorders: Secondary | ICD-10-CM | POA: Diagnosis not present

## 2016-12-26 DIAGNOSIS — R1032 Left lower quadrant pain: Secondary | ICD-10-CM | POA: Diagnosis not present

## 2016-12-26 DIAGNOSIS — M4185 Other forms of scoliosis, thoracolumbar region: Secondary | ICD-10-CM | POA: Diagnosis not present

## 2016-12-26 DIAGNOSIS — F329 Major depressive disorder, single episode, unspecified: Secondary | ICD-10-CM

## 2016-12-26 LAB — POCT URINALYSIS DIPSTICK
Bilirubin, UA: NEGATIVE
Glucose, UA: NEGATIVE
Ketones, UA: NEGATIVE
Leukocytes, UA: NEGATIVE
Nitrite, UA: NEGATIVE
Protein, UA: NEGATIVE
Spec Grav, UA: <=1.005
Urobilinogen, UA: 0.2
pH, UA: 5.5

## 2016-12-26 LAB — URINALYSIS, ROUTINE W REFLEX MICROSCOPIC
Bilirubin Urine: NEGATIVE
Hgb urine dipstick: NEGATIVE
Ketones, ur: NEGATIVE
Leukocytes, UA: NEGATIVE
Nitrite: NEGATIVE
Specific Gravity, Urine: <=1.005 — AB (ref 1.000–1.030)
Total Protein, Urine: NEGATIVE
Urine Glucose: NEGATIVE
Urobilinogen, UA: 0.2 (ref 0.0–1.0)
pH: 6 (ref 5.0–8.0)

## 2016-12-26 LAB — BASIC METABOLIC PANEL
BUN: 12 mg/dL (ref 6–23)
CO2: 27 mEq/L (ref 19–32)
Calcium: 10.1 mg/dL (ref 8.4–10.5)
Chloride: 100 mEq/L (ref 96–112)
Creatinine, Ser: 0.65 mg/dL (ref 0.40–1.20)
GFR: 98.3 mL/min (ref >60.00)
Glucose, Bld: 86 mg/dL (ref 70–99)
Potassium: 4.8 mEq/L (ref 3.5–5.1)
Sodium: 135 mEq/L (ref 135–145)

## 2016-12-26 LAB — POCT I-STAT CREATININE: Creatinine, Ser: 0.6 mg/dL (ref 0.44–1.00)

## 2016-12-26 MED ORDER — ESCITALOPRAM OXALATE 20 MG PO TABS: 20.0000 mg | ORAL_TABLET | Freq: Every day | ORAL | 1 refills | Status: DC

## 2016-12-26 MED ORDER — IOPAMIDOL (ISOVUE-300) INJECTION 61%: 100.0000 mL | Freq: Once | INTRAVENOUS | Status: DC | PRN

## 2016-12-26 MED ORDER — METOPROLOL SUCCINATE ER 50 MG PO TB24: 50.0000 mg | ORAL_TABLET | Freq: Every day | ORAL | 1 refills | Status: DC

## 2016-12-26 MED ORDER — CLONAZEPAM 0.5 MG PO TABS: 0.5000 mg | ORAL_TABLET | Freq: Two times a day (BID) | ORAL | 0 refills | Status: DC

## 2016-12-26 MED ORDER — IOPAMIDOL (ISOVUE-300) INJECTION 61%
100.0000 mL | Freq: Once | INTRAVENOUS | Status: AC | PRN
Start: 2016-12-26 — End: 2016-12-26
  Administered 2016-12-26: 100 mL via INTRAVENOUS

## 2016-12-26 NOTE — Assessment & Plan Note (Signed)
Hyponatremic at prior visit. We stopped the hydrochlorothiazide. We will repeat BMP to see if sodium has normalized. Blood pressure remains elevated and we'll increase metoprolol from 25 mg to 50 mg. Follow-up in one month. She continues to follow with cardiology.

## 2016-12-26 NOTE — Patient Instructions (Signed)
Labs  Urine Increased lexapro  CT abdomen pelvis today  f/u one month

## 2016-12-26 NOTE — Progress Notes (Signed)
Subjective:    Patient ID: Deborah Koch, female    DOB: March 11, 1956, 61 y.o.   MRN: [id number]  CC: Deborah Koch is a 61 y.o. female who presents today for follow up.   HPI: Anxiety and depression- Hard time decreasing klonopin over the holidays. Would like to increase lexapro.   H/o kidney stones, diverticulitis.  4 days  had nausea , colicky left lower adbominal pain, believe passed  Stone 2 days ago as pain resolved. No sharp pain, fever, abdominal pain, diarrhea, dysuria. There is continued lower abdominal 'soreness". No fever, diarrhea, nausea, blood in stool.     HISTORY:  Past Medical History:  Diagnosis Date  . Anxiety   . C. difficile colitis    a. 07/2014.  [name] Congenital bicuspid aortic valve   . Depression   . Dilated Ascending Aorta    a. 12/2013 Echo: 4.45cm.  . Diverticulitis    hospitalized; subsequent c diff infection.   [name] GERD (gastroesophageal reflux disease)   . H/O mastitis 2015  . History of tobacco abuse    a. 20 yrs, 1/4 ppd - quit.  [name] HTN (hypertension)   . Hypokalemia   . Moderate to severe aortic stenosis    a. 12/2013 Echo: EF 60-65%, Grade 1 DD, bicuspid AoV with mod-sev AS [79mmHg mean gradient, AoV area 0.69cm^2 (VTI)], mod dil ascending Ao - 4.45cm, mild TR, PASP 56mmHg.   Past Surgical History:  Procedure Laterality Date  . BARTHOLIN GLAND CYST EXCISION    . BREAST BIOPSY Left    neg  . COLONOSCOPY  2013   Dr. Allen Norris  . OOPHORECTOMY  2016  . OVARY SURGERY  2016   cyst on right ovary   Family History  Problem Relation Age of Onset  . Hypertension Mother   . Colon cancer Mother   . Anxiety disorder Mother   . Depression Mother   . Heart failure Father   . Hypertension Father   . Depression Sister   . Anxiety disorder Sister   . Alcohol abuse Brother   . Schizophrenia Brother   . Breast cancer Maternal Aunt   . Heart attack Neg Hx   . Stroke Neg Hx     Allergies: Vicodin [hydrocodone-acetaminophen] Current  Outpatient Prescriptions on File Prior to Visit  Medication Sig Dispense Refill  . lisinopril (PRINIVIL,ZESTRIL) 20 MG tablet Take 1 tablet (20 mg total) by mouth daily. 90 tablet 3  . ranitidine (ZANTAC) 150 MG tablet Take 1 tablet (150 mg total) by mouth every morning. 90 tablet 2   No current facility-administered medications on file prior to visit.     Social History  Substance Use Topics  . Smoking status: Current Every Day Smoker    Packs/day: 0.25    Years: 20.00    Types: Cigarettes    Last attempt to quit: 06/07/2005  . Smokeless tobacco: Never Used  . Alcohol use 2.4 oz/week    2 Glasses of wine, 2 Cans of beer per week     Comment: occasional    Review of Systems  Constitutional: Negative for chills and fever.  Respiratory: Negative for cough.   Cardiovascular: Negative for chest pain and palpitations.  Gastrointestinal: Positive for abdominal pain. Negative for abdominal distention, constipation, diarrhea, nausea and vomiting.  Genitourinary: Negative for dysuria, flank pain and hematuria.  Musculoskeletal: Negative for arthralgias and myalgias.  Skin: Negative for rash.  Neurological: Negative for headaches.  Hematological: Negative for adenopathy.  Psychiatric/Behavioral: The patient is  nervous/anxious.       Objective:    BP (!) 166/100   Pulse 80   Temp 98 F (36.7 C) (Oral)   Ht 5\' 6"  (1.676 m)   Wt 128 lb 12.8 oz (58.4 kg)   SpO2 100%   BMI 20.79 kg/m  BP Readings from Last 3 Encounters:  12/26/16 (!) 166/100  10/09/16 138/88  09/05/16 (!) 151/91   Wt Readings from Last 3 Encounters:  12/26/16 128 lb 12.8 oz (58.4 kg)  10/09/16 130 lb 3.2 oz (59.1 kg)  09/05/16 130 lb 1.6 oz (59 kg)    Physical Exam  Constitutional: She appears well-developed and well-nourished.  Eyes: Conjunctivae are normal.  Cardiovascular: Normal rate, regular rhythm, normal heart sounds and normal pulses.   Pulmonary/Chest: Effort normal and breath sounds normal. She  has no wheezes. She has no rhonchi. She has no rales.  Abdominal: Soft. Normal appearance. She exhibits no distension, no fluid wave, no ascites and no mass. Bowel sounds are increased. There is no tenderness. There is no rigidity, no rebound, no guarding and no CVA tenderness.  Neurological: She is alert.  Skin: Skin is warm and dry.  Psychiatric: She has a normal mood and affect. Her speech is normal and behavior is normal. Thought content normal.  Vitals reviewed.      Assessment & Plan:   Problem List Items Addressed This Visit      Cardiovascular and Mediastinum   HTN (hypertension)    Hyponatremic at prior visit. We stopped the hydrochlorothiazide. We will repeat BMP to see if sodium has normalized. Blood pressure remains elevated and we'll increase metoprolol from 25 mg to 50 mg. Follow-up in one month. She continues to follow with cardiology.      Relevant Medications   metoprolol succinate (TOPROL-XL) 50 MG 24 hr tablet   Other Relevant Orders   Basic metabolic panel     Other   Anxiety and depression    We jointly agreed to go ahead and increase Lexapro from 10-20 mg. After this has stabilized, we will then try again to decrease  Klonopin - starting with half tab of Klonopin in the morning. F/u one month.      Relevant Medications   clonazePAM (KLONOPIN) 0.5 MG tablet   escitalopram (LEXAPRO) 20 MG tablet   LLQ abdominal pain - Primary    Reassured by benign abdominal exam and absence of fever. However due to patient's History of diverticulitis, hospitalization, and C. difficile infection, patient and I jointly agreed to go ahead and pursue CT abdomen and pelvis. Pending urinalysis as well.          I have changed Ms. Buice's metoprolol succinate and escitalopram. I am also having her maintain her ranitidine, lisinopril, and clonazePAM.   Meds ordered this encounter  Medications  . metoprolol succinate (TOPROL-XL) 50 MG 24 hr tablet    Sig: Take 1 tablet (50 mg  total) by mouth daily.    Dispense:  90 tablet    Refill:  1    Order Specific Question:   Supervising Provider    Answer:   Deborra Medina L [[id number]]  . clonazePAM (KLONOPIN) 0.5 MG tablet    Sig: Take 1 tablet (0.5 mg total) by mouth 2 (two) times daily.    Dispense:  60 tablet    Refill:  0    Do not fill before 11/16.    Order Specific Question:   Supervising Provider    Answer:   Derrel Nip  TERESA L [[id number]]  . escitalopram (LEXAPRO) 20 MG tablet    Sig: Take 1 tablet (20 mg total) by mouth daily.    Dispense:  90 tablet    Refill:  1    Order Specific Question:   Supervising Provider    Answer:   Crecencio Mc [[id number]]    Return precautions given.   Risks, benefits, and alternatives of the medications and treatment plan prescribed today were discussed, and patient expressed understanding.   Education regarding symptom management and diagnosis given to patient on AVS.  Continue to follow with Mable Paris, FNP for routine health maintenance.   [name] and I agreed with plan.   Mable Paris, FNP

## 2016-12-26 NOTE — Assessment & Plan Note (Signed)
Reassured by benign abdominal exam and absence of fever. However due to patient's History of diverticulitis, hospitalization, and C. difficile infection, patient and I jointly agreed to go ahead and pursue CT abdomen and pelvis. Pending urinalysis as well.

## 2016-12-26 NOTE — Telephone Encounter (Signed)
Called pt  Will discuss cholesterol at later date as pt not sure if she wants to start cholesterol med based on CT results  Explained results of neg UA, normal electrolytes on BMP, and CT abdomen neg renal stone, diverticulitis. Did note hepatic cyst, stable

## 2016-12-26 NOTE — Assessment & Plan Note (Signed)
We jointly agreed to go ahead and increase Lexapro from 10-20 mg. After this has stabilized, we will then try again to decrease  Klonopin - starting with half tab of Klonopin in the morning. F/u one month.

## 2016-12-26 NOTE — Progress Notes (Signed)
Pre visit review using our clinic review tool, if applicable. No additional management support is needed unless otherwise documented below in the visit note. 

## 2016-12-27 ENCOUNTER — Other Ambulatory Visit: Payer: Self-pay | Admitting: Family

## 2016-12-27 DIAGNOSIS — Z1211 Encounter for screening for malignant neoplasm of colon: Secondary | ICD-10-CM

## 2016-12-28 LAB — CULTURE, URINE COMPREHENSIVE: Organism ID, Bacteria: NO GROWTH

## 2017-01-09 ENCOUNTER — Other Ambulatory Visit: Payer: Self-pay | Admitting: Cardiovascular Disease

## 2017-01-09 DIAGNOSIS — Q2381 Bicuspid aortic valve: Secondary | ICD-10-CM

## 2017-01-09 DIAGNOSIS — Q231 Congenital insufficiency of aortic valve: Secondary | ICD-10-CM

## 2017-01-10 ENCOUNTER — Ambulatory Visit: Payer: BLUE CROSS/BLUE SHIELD | Admitting: Family

## 2017-01-16 ENCOUNTER — Encounter: Payer: Self-pay | Admitting: Family

## 2017-01-16 ENCOUNTER — Telehealth: Payer: Self-pay | Admitting: *Deleted

## 2017-01-16 NOTE — Telephone Encounter (Signed)
Please advise 

## 2017-01-16 NOTE — Telephone Encounter (Signed)
Pt requested to have her Lexapro decreased back to 10 mg. Pt is feeling more depressed with the increased dosage. Pt only wants to sleep. Pt is currently feeling jittery and not wanting to leave her house.  Pt requested a call from [facility]  Pt contact [phone]

## 2017-01-17 ENCOUNTER — Telehealth: Payer: Self-pay | Admitting: Family

## 2017-01-17 DIAGNOSIS — F329 Major depressive disorder, single episode, unspecified: Secondary | ICD-10-CM

## 2017-01-17 DIAGNOSIS — F419 Anxiety disorder, unspecified: Principal | ICD-10-CM

## 2017-01-17 DIAGNOSIS — F32A Depression, unspecified: Secondary | ICD-10-CM

## 2017-01-17 MED ORDER — ESCITALOPRAM OXALATE 10 MG PO TABS: 10.0000 mg | ORAL_TABLET | Freq: Every day | ORAL | 1 refills | Status: DC

## 2017-01-17 NOTE — Telephone Encounter (Signed)
ordered

## 2017-01-18 ENCOUNTER — Other Ambulatory Visit: Payer: Medicare Other

## 2017-01-25 ENCOUNTER — Other Ambulatory Visit: Payer: Self-pay | Admitting: Family

## 2017-01-25 DIAGNOSIS — F329 Major depressive disorder, single episode, unspecified: Secondary | ICD-10-CM

## 2017-01-25 DIAGNOSIS — F32A Depression, unspecified: Secondary | ICD-10-CM

## 2017-01-25 DIAGNOSIS — F419 Anxiety disorder, unspecified: Principal | ICD-10-CM

## 2017-01-30 NOTE — Telephone Encounter (Signed)
Pt called requesting a refill on this medication. She is just about out. Please advise, thank you!  Pharmacy - [facility] [place], [place]

## 2017-01-31 ENCOUNTER — Ambulatory Visit: Payer: Medicare Other | Admitting: Cardiovascular Disease

## 2017-02-01 NOTE — Telephone Encounter (Signed)
Refill request for Klonopin, last seen [id number], last filled [id number].  Please advise.

## 2017-02-25 ENCOUNTER — Other Ambulatory Visit: Payer: Self-pay | Admitting: Family

## 2017-02-25 DIAGNOSIS — F329 Major depressive disorder, single episode, unspecified: Secondary | ICD-10-CM

## 2017-02-25 DIAGNOSIS — F32A Depression, unspecified: Secondary | ICD-10-CM

## 2017-02-25 DIAGNOSIS — F419 Anxiety disorder, unspecified: Principal | ICD-10-CM

## 2017-02-27 ENCOUNTER — Other Ambulatory Visit: Payer: Medicare Other

## 2017-03-01 ENCOUNTER — Encounter: Payer: Self-pay | Admitting: Family

## 2017-03-01 NOTE — Telephone Encounter (Signed)
Pt called and is requesting this refill. Pt only has for tomorrow. Pt asked to be call once completed. Please advise, thank you!  Pharmacy - [facility] [address] - [place], [place] (now [facility])  Call pt @ [phone]

## 2017-03-04 ENCOUNTER — Encounter: Payer: Self-pay | Admitting: Family

## 2017-03-05 ENCOUNTER — Encounter: Payer: Self-pay | Admitting: Family

## 2017-03-05 ENCOUNTER — Other Ambulatory Visit: Payer: Self-pay | Admitting: Family

## 2017-03-05 ENCOUNTER — Ambulatory Visit: Payer: Medicare Other | Admitting: Cardiovascular Disease

## 2017-03-05 DIAGNOSIS — F419 Anxiety disorder, unspecified: Principal | ICD-10-CM

## 2017-03-05 DIAGNOSIS — F32A Depression, unspecified: Secondary | ICD-10-CM

## 2017-03-05 DIAGNOSIS — F329 Major depressive disorder, single episode, unspecified: Secondary | ICD-10-CM

## 2017-03-05 MED ORDER — CLONAZEPAM 0.5 MG PO TABS: 0.5000 mg | ORAL_TABLET | Freq: Two times a day (BID) | ORAL | 1 refills | Status: DC

## 2017-04-16 ENCOUNTER — Other Ambulatory Visit: Payer: Self-pay

## 2017-04-16 DIAGNOSIS — Z1231 Encounter for screening mammogram for malignant neoplasm of breast: Secondary | ICD-10-CM

## 2017-04-25 ENCOUNTER — Other Ambulatory Visit: Payer: Self-pay | Admitting: Family

## 2017-04-25 ENCOUNTER — Encounter: Payer: Self-pay | Admitting: Family

## 2017-04-25 DIAGNOSIS — F419 Anxiety disorder, unspecified: Principal | ICD-10-CM

## 2017-04-25 DIAGNOSIS — F329 Major depressive disorder, single episode, unspecified: Secondary | ICD-10-CM

## 2017-04-25 DIAGNOSIS — F32A Depression, unspecified: Secondary | ICD-10-CM

## 2017-04-26 ENCOUNTER — Other Ambulatory Visit: Payer: Self-pay

## 2017-04-26 ENCOUNTER — Ambulatory Visit (INDEPENDENT_AMBULATORY_CARE_PROVIDER_SITE_OTHER): Payer: Medicare Other

## 2017-04-26 DIAGNOSIS — Q231 Congenital insufficiency of aortic valve: Secondary | ICD-10-CM

## 2017-04-26 LAB — ECHOCARDIOGRAM COMPLETE
AO mean calculated velocity dopler: 246 cm/s
AV Area VTI index: 0.51 cm2/m2
AV Area mean vel: 0.81 cm2
AV Mean grad: 28 mmHg
AV VEL mean LVOT/AV: 0.26
AV area mean vel ind: 0.49 cm2/m2
AV vel: 0.84
Area-P 1/2: 2.89 cm2
E decel time: 261 msec
E/e' ratio: 8.46
FS: 30 % (ref 28–44)
IVS/LV PW RATIO, ED: 1
LA ID, A-P, ES: 31 mm
LA diam end sys: 31 mm
LA diam index: 1.87 cm/m2
LA vol A4C: 34.2 ml
LA vol index: 27.8 mL/m2
LA vol: 46.1 mL
LV E/e' medial: 8.46
LV E/e'average: 8.46
LV PW d: 12 mm — AB (ref 0.6–1.1)
LV dias vol index: 46 mL/m2
LV dias vol: 77 mL (ref 46–106)
LV e' LATERAL: 10.6 cm/s
LV sys vol index: 20 mL/m2
LV sys vol: 33 mL (ref 14–42)
LVOT SV: 73 mL
LVOT VTI: 23.1 cm
LVOT area: 3.14 cm2
LVOT diameter: 20 mm
LVOT peak VTI: 0.27 cm
Lateral S' vel: 15.4 cm/s
MV Dec: 261
MV Peak grad: 3 mmHg
MV pk A vel: 104 m/s
MV pk E vel: 89.7 m/s
P 1/2 time: 76 ms
Simpson's disk: 57
Stroke v: 44 ml
TAPSE: 29 mm
TDI e' lateral: 10.6
TDI e' medial: 7.72
VTI: 86 cm
Valve area index: 0.51
Valve area: 0.84 cm2

## 2017-04-29 NOTE — Progress Notes (Signed)
Cardiology Office Note  Date:  05/01/2017   ID:  Deborah, Koch 1956/08/14, MRN [id number]  PCP:  Burnard Hawthorne, FNP   Chief Complaint  Patient presents with  . OTHER    1 yr f/u no complaints today. Meds reviewed verbally with pt.    HPI:  [name] is a very pleasant 61 year old woman with history of  bicuspid aortic valve, moderate aortic valve stenosis , moderate TR 4.1 cm ascending aorta dilation, on echo 2015 not visualized well on echocardiogram since then hypertension,  GERD,  out on disability for scoliosis among other issues prior smoking history who presents for routine follow-up of her aortic valve disease.  She reports that since her last clinic visit HCTZ was held Metoprolol increased, Less stress, eating better, yoga With the above, blood pressure has been much improved She attributes much of her previous stress to problems with her elderly mother She does not check her numbers at home, systolic 633 on today's visit  She reports that she feels well, denies shortness of breath or chest pressure on exertion Regular exercise program No complaints  Recent echocardiogram results discussed with her January 2015 Echo: Mean gradient 27, peak gradient 44,  Echocardiogram November 2016,  Mean gradient 31, peak gradient 50, peak velocity 354 cm/s  Echocardiogram May 2018 Mean gradient 28, peak radiant 55, peak velocity 370  On today's EKG reviewed with her she shows normal sinus rhythm rate 64 bpm no significant ST or T-wave changes  Other past medical history reviewed Echocardiogram January 2015 and repeat echocardiogram September 2015 essentially unchanged She has 4.1 cm ascending aorta dilation, with moderate aortic valve stenosis, peak velocity 283 cm/s, mean gradient 27 mmHg, peak gradient 44 mmHg .   In August 2015, she reports having diverticulitis, C. difficile requiring long course of antibiotics in recovery   She reports that she used  to smoke but stopped 10 years ago. Smoked since he was younger. She reports her cholesterol is very good though she does not know any specifics. No diabetes. Occasional anxiety and takes Klonopin when necessary  PMH:   has a past medical history of Anxiety; C. difficile colitis; Congenital bicuspid aortic valve; Depression; Dilated Ascending Aorta; Diverticulitis; GERD (gastroesophageal reflux disease); H/O mastitis (2015); History of tobacco abuse; HTN (hypertension); Hypokalemia; and Moderate to severe aortic stenosis.  PSH:    Past Surgical History:  Procedure Laterality Date  . BARTHOLIN GLAND CYST EXCISION    . BREAST BIOPSY Left    neg  . COLONOSCOPY  2013   Dr. Allen Norris  . OOPHORECTOMY  2016  . OVARY SURGERY  2016   cyst on right ovary    Current Outpatient Prescriptions  Medication Sig Dispense Refill  . clonazePAM (KLONOPIN) 0.5 MG tablet Take 1 tablet (0.5 mg total) by mouth 2 (two) times daily. 60 tablet 1  . escitalopram (LEXAPRO) 10 MG tablet Take 1 tablet (10 mg total) by mouth daily. 90 tablet 1  . lisinopril (PRINIVIL,ZESTRIL) 20 MG tablet Take 1 tablet (20 mg total) by mouth daily. 90 tablet 3  . metoprolol succinate (TOPROL-XL) 50 MG 24 hr tablet Take 1 tablet (50 mg total) by mouth daily. 90 tablet 1  . ranitidine (ZANTAC) 150 MG tablet Take 1 tablet (150 mg total) by mouth every morning. 90 tablet 2   No current facility-administered medications for this visit.      Allergies:   Vicodin [hydrocodone-acetaminophen]   Social History:  The patient  reports that she  has been smoking Cigarettes.  She has a 5.00 pack-year smoking history. She has never used smokeless tobacco. She reports that she drinks about 2.4 oz of alcohol per week . She reports that she does not use drugs.   Family History:   family history includes Alcohol abuse in her brother; Anxiety disorder in her mother and sister; Breast cancer in her maternal aunt; Colon cancer in her mother; Depression in her  mother and sister; Heart failure in her father; Hypertension in her father and mother; Schizophrenia in her brother.    Review of Systems: Review of Systems  Constitutional: Negative.   Respiratory: Negative.   Cardiovascular: Negative.   Gastrointestinal: Negative.   Musculoskeletal: Negative.   Neurological: Negative.   Psychiatric/Behavioral: Negative.   All other systems reviewed and are negative.    PHYSICAL EXAM: VS:  BP 120/84 (BP Location: Left Arm, Patient Position: Sitting, Cuff Size: Normal)   Pulse 64   Ht 5\' 5"  (1.651 m)   Wt 129 lb 4 oz (58.6 kg)   BMI 21.51 kg/m  , BMI Body mass index is 21.51 kg/m. GEN: Well nourished, well developed, in no acute distress  HEENT: normal  Neck: no JVD, carotid bruits, or masses Cardiac: RRR; 2+ SEM RSB,  No rubs, or gallops,no edema  Respiratory:  clear to auscultation bilaterally, normal work of breathing GI: soft, nontender, nondistended, + BS MS: no deformity or atrophy  Skin: warm and dry, no rash Neuro:  Strength and sensation are intact Psych: euthymic mood, full affect    Recent Labs: 10/09/2016: ALT 12; Hemoglobin 13.2; Platelets 386.0; TSH 1.89 12/26/2016: BUN 12; Creatinine, Ser 0.60; Potassium 4.8; Sodium 135    Lipid Panel Lab Results  Component Value Date   CHOL 194 10/09/2016   HDL 89.00 10/09/2016   LDLCALC 90 10/09/2016   TRIG 72.0 10/09/2016      Wt Readings from Last 3 Encounters:  05/01/17 129 lb 4 oz (58.6 kg)  12/26/16 128 lb 12.8 oz (58.4 kg)  10/09/16 130 lb 3.2 oz (59.1 kg)       ASSESSMENT AND PLAN:  Moderate aortic stenosis - Plan: EKG 12-Lead Bicuspid valve, mild progression in her measurements over the past 3 years Recommended repeat echocardiogram next year She is asymptomatic We discussed various types of surgical options for aortic valve repair  Essential hypertension - Plan: EKG 12-Lead Blood pressure is well controlled on today's visit. No changes made to the  medications.  Ascending aorta dilation (HCC) - Plan: EKG 12-Lead Discussed that we have not visualized this well since 2015 We did discuss possible CT She is still paying off previous CT scans when she had no insurance. We'll hold off on imaging studies at this time. I will review review previous echocardiogram from 2016 and 18 to evaluate her ascending aorta  Congenital bicuspid aortic valve - Plan: EKG 12-Lead Discussed with her in detail again Confirmed on echocardiogram  Disposition:   F/U  12 months   Total encounter time more than 25 minutes  Greater than 50% was spent in counseling and coordination of care with the patient    Orders Placed This Encounter  Procedures  . EKG 12-Lead     Signed, Esmond Plants, M.D., Ph.D. 05/01/2017  [place], [place]

## 2017-04-30 ENCOUNTER — Other Ambulatory Visit: Payer: Self-pay | Admitting: Family

## 2017-04-30 DIAGNOSIS — F419 Anxiety disorder, unspecified: Principal | ICD-10-CM

## 2017-04-30 DIAGNOSIS — F329 Major depressive disorder, single episode, unspecified: Secondary | ICD-10-CM

## 2017-04-30 DIAGNOSIS — F32A Depression, unspecified: Secondary | ICD-10-CM

## 2017-04-30 MED ORDER — CLONAZEPAM 0.5 MG PO TABS: 0.5000 mg | ORAL_TABLET | Freq: Two times a day (BID) | ORAL | 1 refills | Status: DC

## 2017-05-01 ENCOUNTER — Ambulatory Visit (INDEPENDENT_AMBULATORY_CARE_PROVIDER_SITE_OTHER): Payer: Medicare Other | Admitting: Cardiovascular Disease

## 2017-05-01 ENCOUNTER — Telehealth: Payer: Self-pay | Admitting: Cardiovascular Disease

## 2017-05-01 ENCOUNTER — Encounter: Payer: Self-pay | Admitting: Family

## 2017-05-01 ENCOUNTER — Encounter: Payer: Self-pay | Admitting: Cardiovascular Disease

## 2017-05-01 VITALS — BP 120/84 | HR 64 | Ht 65.0 in | Wt 129.2 lb

## 2017-05-01 DIAGNOSIS — Q231 Congenital insufficiency of aortic valve: Secondary | ICD-10-CM

## 2017-05-01 DIAGNOSIS — I77810 Thoracic aortic ectasia: Secondary | ICD-10-CM | POA: Diagnosis not present

## 2017-05-01 DIAGNOSIS — I350 Nonrheumatic aortic (valve) stenosis: Secondary | ICD-10-CM

## 2017-05-01 DIAGNOSIS — I10 Essential (primary) hypertension: Secondary | ICD-10-CM | POA: Diagnosis not present

## 2017-05-01 NOTE — Telephone Encounter (Signed)
Patient wants to know what echo results are and she forgot to ask at ov today please call   Patient looked on mychart and it said LVPWD scored 12 but everything else is normal patient says this value of 12 is way outside normal value range please call to discuss.

## 2017-05-01 NOTE — Patient Instructions (Signed)
Medication Instructions:   No medication changes made  Labwork:  No new labs needed  Testing/Procedures:  Consider repeat echo for aortic valve stenosis, ascending aorta dilation in one year   I recommend watching educational videos on topics of interest to you at:       [email or web address].goemmi.com  Enter code: HEARTCARE    Follow-Up: It was a pleasure seeing you in the office today. Please call us if you have new issues that need to be addressed before your next appt.  [phone]  Your physician wants you to follow-up in: 12 months.  You will receive a reminder letter in the mail two months in advance. If you don't receive a letter, please call our office to schedule the follow-up appointment.  If you need a refill on your cardiac medications before your next appointment, please call your pharmacy.

## 2017-05-01 NOTE — Telephone Encounter (Signed)
Spoke w/ pt.  Advised her that Dr. Rockey Situ reviewed her ECHO again and this reading is normal.  She verbalizes understanding and will call back w/ any further questions or concerns.

## 2017-05-02 ENCOUNTER — Encounter: Payer: Self-pay | Admitting: General Surgery

## 2017-05-02 ENCOUNTER — Encounter: Payer: Self-pay | Admitting: Cardiovascular Disease

## 2017-05-02 NOTE — Telephone Encounter (Signed)
Spoke with patient by phone regarding the number in question and let her know that those numbers represent measurements that may not be relevant. Reviewed that the physician reviews images and also reviews those numbers to see if they need to be recalculated. Let her know that Dr. Rockey Situ reviewed and his recommendations are to have repeat done in 1 year to monitor. She verbalized understanding with no further questions at this time.

## 2017-05-11 ENCOUNTER — Other Ambulatory Visit: Payer: Self-pay | Admitting: Psychiatry

## 2017-05-11 ENCOUNTER — Telehealth: Payer: Self-pay

## 2017-05-11 ENCOUNTER — Other Ambulatory Visit: Payer: Self-pay

## 2017-05-11 DIAGNOSIS — Z1211 Encounter for screening for malignant neoplasm of colon: Secondary | ICD-10-CM

## 2017-05-11 NOTE — Telephone Encounter (Signed)
Gastroenterology Pre-Procedure Review  Request Date: 6/14 Requesting Physician: Dr. Allen Norris  PATIENT REVIEW QUESTIONS: The patient responded to the following health history questions as indicated:    1. Are you having any GI issues? no 2. Do you have a personal history of Polyps? Yes 3. Do you have a family history of Colon Cancer or Polyps? no 4. Diabetes Mellitus? no 5. Joint replacements in the past 12 months?no 6. Major health problems in the past 3 months?no 7. Any artificial heart valves, MVP, or defibrillator?no    MEDICATIONS & ALLERGIES:    Patient reports the following regarding taking any anticoagulation/antiplatelet therapy:   Plavix, Coumadin, Eliquis, Xarelto, Lovenox, Pradaxa, Brilinta, or Effient? no Aspirin? no  Patient confirms/reports the following medications:  Current Outpatient Prescriptions  Medication Sig Dispense Refill  . clonazePAM (KLONOPIN) 0.5 MG tablet Take 1 tablet (0.5 mg total) by mouth 2 (two) times daily. 60 tablet 1  . escitalopram (LEXAPRO) 10 MG tablet Take 1 tablet (10 mg total) by mouth daily. 90 tablet 1  . lisinopril (PRINIVIL,ZESTRIL) 20 MG tablet Take 1 tablet (20 mg total) by mouth daily. 90 tablet 3  . metoprolol succinate (TOPROL-XL) 50 MG 24 hr tablet Take 1 tablet (50 mg total) by mouth daily. 90 tablet 1  . ranitidine (ZANTAC) 150 MG tablet Take 1 tablet (150 mg total) by mouth every morning. 90 tablet 2   No current facility-administered medications for this visit.     Patient confirms/reports the following allergies:  Allergies  Allergen Reactions  . Vicodin [Hydrocodone-Acetaminophen] Hives and Rash    No orders of the defined types were placed in this encounter.   AUTHORIZATION INFORMATION Primary Insurance: 1D#: Group #:  Secondary Insurance: 1D#: Group #:  SCHEDULE INFORMATION: Date: 6/14 Time: Location: [place]

## 2017-05-15 ENCOUNTER — Telehealth: Payer: Self-pay | Admitting: Gastroenterology

## 2017-05-15 NOTE — Telephone Encounter (Signed)
05/16/17 [employer] website for Screening Colonoscopy [phone] / Z12.11 does NOT require a prior auth Decision ID #: [id number]

## 2017-05-22 ENCOUNTER — Telehealth: Payer: Self-pay | Admitting: Gastroenterology

## 2017-05-22 NOTE — Telephone Encounter (Signed)
Patient needs to reschedule her colonoscopy until October due to new insurance. Please call

## 2017-06-01 ENCOUNTER — Encounter: Payer: Self-pay | Admitting: Family

## 2017-06-21 ENCOUNTER — Other Ambulatory Visit: Payer: Self-pay | Admitting: Family

## 2017-06-21 DIAGNOSIS — F32A Depression, unspecified: Secondary | ICD-10-CM

## 2017-06-21 DIAGNOSIS — F419 Anxiety disorder, unspecified: Principal | ICD-10-CM

## 2017-06-21 DIAGNOSIS — F329 Major depressive disorder, single episode, unspecified: Secondary | ICD-10-CM

## 2017-06-26 NOTE — Telephone Encounter (Signed)
Pt called to follow up on the Rx. Pt will be out of them tomorrow. Thank you!

## 2017-06-27 ENCOUNTER — Telehealth: Payer: Self-pay | Admitting: Family

## 2017-06-27 ENCOUNTER — Other Ambulatory Visit: Payer: Self-pay | Admitting: Family

## 2017-06-27 DIAGNOSIS — F419 Anxiety disorder, unspecified: Principal | ICD-10-CM

## 2017-06-27 DIAGNOSIS — F329 Major depressive disorder, single episode, unspecified: Secondary | ICD-10-CM

## 2017-06-27 DIAGNOSIS — F32A Depression, unspecified: Secondary | ICD-10-CM

## 2017-06-27 NOTE — Telephone Encounter (Signed)
I looked up patient on [place] Controlled Substances Reporting System and saw no activity that raised concern of inappropriate use.   Refilled klonopin   

## 2017-07-11 ENCOUNTER — Other Ambulatory Visit: Payer: Self-pay | Admitting: Family

## 2017-07-11 DIAGNOSIS — I10 Essential (primary) hypertension: Secondary | ICD-10-CM

## 2017-07-15 ENCOUNTER — Other Ambulatory Visit: Payer: Self-pay | Admitting: Family

## 2017-07-15 DIAGNOSIS — F32A Depression, unspecified: Secondary | ICD-10-CM

## 2017-07-15 DIAGNOSIS — F329 Major depressive disorder, single episode, unspecified: Secondary | ICD-10-CM

## 2017-07-15 DIAGNOSIS — F419 Anxiety disorder, unspecified: Principal | ICD-10-CM

## 2017-07-23 ENCOUNTER — Ambulatory Visit: Payer: BLUE CROSS/BLUE SHIELD | Admitting: General Surgery

## 2017-08-06 ENCOUNTER — Telehealth: Payer: Self-pay | Admitting: Gastroenterology

## 2017-08-06 NOTE — Telephone Encounter (Signed)
Pts colonoscopy has been moved up from November to Aug 24th with Dr. Allen Norris due to starting new job.

## 2017-08-06 NOTE — Telephone Encounter (Signed)
Patient needs to r/s her procedure. 

## 2017-08-06 NOTE — Telephone Encounter (Signed)
Patient needs to reschedule her procedure. Her husband can't get off work

## 2017-08-07 NOTE — Telephone Encounter (Signed)
LVM for pt to return my call.

## 2017-08-10 ENCOUNTER — Encounter: Admission: RE | Payer: Self-pay | Source: Ambulatory Visit

## 2017-08-10 ENCOUNTER — Ambulatory Visit: Admission: RE | Admit: 2017-08-10 | Payer: Medicare Other | Source: Ambulatory Visit | Admitting: Gastroenterology

## 2017-08-10 SURGERY — COLONOSCOPY WITH PROPOFOL
Anesthesia: Choice

## 2017-08-15 ENCOUNTER — Other Ambulatory Visit: Payer: Self-pay | Admitting: Family

## 2017-08-15 DIAGNOSIS — F329 Major depressive disorder, single episode, unspecified: Secondary | ICD-10-CM

## 2017-08-15 DIAGNOSIS — F32A Depression, unspecified: Secondary | ICD-10-CM

## 2017-08-15 DIAGNOSIS — F419 Anxiety disorder, unspecified: Principal | ICD-10-CM

## 2017-08-15 NOTE — Telephone Encounter (Signed)
Refill request for Klonopin, last seen [id number], last filled [id number].  Please advise.

## 2017-08-16 NOTE — Telephone Encounter (Signed)
Call pt  Refilled without a refill.   She needs f/u. She was supposed to follow up shortly after January appt for anxiety.   On controlled substances, needs OV between 3-6 months  I looked up patient on [place] Controlled Substances Reporting System and saw no activity that raised concern of inappropriate use.

## 2017-08-17 ENCOUNTER — Ambulatory Visit
Admission: RE | Admit: 2017-08-17 | Discharge: 2017-08-17 | Disposition: A | Discharge status: Home / Self Care | Payer: Medicare Other | Source: Ambulatory Visit | Attending: General Surgery | Admitting: General Surgery

## 2017-08-17 DIAGNOSIS — Z1231 Encounter for screening mammogram for malignant neoplasm of breast: Secondary | ICD-10-CM | POA: Insufficient documentation

## 2017-08-19 ENCOUNTER — Encounter: Payer: Self-pay | Admitting: Emergency Medicine

## 2017-08-19 ENCOUNTER — Emergency Department: Payer: Medicare Other

## 2017-08-19 ENCOUNTER — Encounter: Payer: Self-pay | Admitting: General Surgery

## 2017-08-19 ENCOUNTER — Emergency Department
Admission: EM | Admit: 2017-08-19 | Discharge: 2017-08-19 | Disposition: A | Discharge status: Home / Self Care | Payer: Medicare Other | Attending: Emergency Medicine | Admitting: Emergency Medicine

## 2017-08-19 DIAGNOSIS — F1010 Alcohol abuse, uncomplicated: Secondary | ICD-10-CM | POA: Diagnosis not present

## 2017-08-19 DIAGNOSIS — Y92009 Unspecified place in unspecified non-institutional (private) residence as the place of occurrence of the external cause: Secondary | ICD-10-CM | POA: Diagnosis not present

## 2017-08-19 DIAGNOSIS — Z79899 Other long term (current) drug therapy: Secondary | ICD-10-CM | POA: Diagnosis not present

## 2017-08-19 DIAGNOSIS — T148XXA Other injury of unspecified body region, initial encounter: Secondary | ICD-10-CM

## 2017-08-19 DIAGNOSIS — Q231 Congenital insufficiency of aortic valve: Secondary | ICD-10-CM | POA: Diagnosis not present

## 2017-08-19 DIAGNOSIS — F17210 Nicotine dependence, cigarettes, uncomplicated: Secondary | ICD-10-CM | POA: Diagnosis not present

## 2017-08-19 DIAGNOSIS — W010XXA Fall on same level from slipping, tripping and stumbling without subsequent striking against object, initial encounter: Secondary | ICD-10-CM | POA: Diagnosis not present

## 2017-08-19 DIAGNOSIS — Y939 Activity, unspecified: Secondary | ICD-10-CM | POA: Diagnosis not present

## 2017-08-19 DIAGNOSIS — M25521 Pain in right elbow: Secondary | ICD-10-CM | POA: Diagnosis present

## 2017-08-19 DIAGNOSIS — Y999 Unspecified external cause status: Secondary | ICD-10-CM | POA: Insufficient documentation

## 2017-08-19 DIAGNOSIS — W19XXXA Unspecified fall, initial encounter: Secondary | ICD-10-CM

## 2017-08-19 DIAGNOSIS — F10920 Alcohol use, unspecified with intoxication, uncomplicated: Secondary | ICD-10-CM

## 2017-08-19 DIAGNOSIS — I119 Hypertensive heart disease without heart failure: Secondary | ICD-10-CM | POA: Diagnosis not present

## 2017-08-19 DIAGNOSIS — I712 Thoracic aortic aneurysm, without rupture: Secondary | ICD-10-CM | POA: Insufficient documentation

## 2017-08-19 DIAGNOSIS — S0003XA Contusion of scalp, initial encounter: Secondary | ICD-10-CM

## 2017-08-19 LAB — CBC WITH DIFFERENTIAL/PLATELET
Basophils Absolute: 0.1 10*3/uL (ref 0–0.1)
Basophils Relative: 1 %
Eosinophils Absolute: 0.3 10*3/uL (ref 0–0.7)
Eosinophils Relative: 3 %
HCT: 38.6 % (ref 35.0–47.0)
Hemoglobin: 13.4 g/dL (ref 12.0–16.0)
Lymphocytes Relative: 34 %
Lymphs Abs: 3.4 10*3/uL (ref 1.0–3.6)
MCH: 32.6 pg (ref 26.0–34.0)
MCHC: 34.7 g/dL (ref 32.0–36.0)
MCV: 93.8 fL (ref 80.0–100.0)
Monocytes Absolute: 0.7 10*3/uL (ref 0.2–0.9)
Monocytes Relative: 8 %
Neutro Abs: 5.3 10*3/uL (ref 1.4–6.5)
Neutrophils Relative %: 54 %
Platelets: 380 10*3/uL (ref 150–440)
RBC: 4.12 MIL/uL (ref 3.80–5.20)
RDW: 13.5 % (ref 11.5–14.5)
WBC: 9.8 10*3/uL (ref 3.6–11.0)

## 2017-08-19 LAB — BASIC METABOLIC PANEL
Anion gap: 10 (ref 5–15)
BUN: 8 mg/dL (ref 6–20)
CO2: 25 mmol/L (ref 22–32)
Calcium: 9.2 mg/dL (ref 8.9–10.3)
Chloride: 98 mmol/L — ABNORMAL LOW (ref 101–111)
Creatinine, Ser: 0.63 mg/dL (ref 0.44–1.00)
GFR calc Af Amer: >60 mL/min (ref >60)
GFR calc non Af Amer: >60 mL/min (ref >60)
Glucose, Bld: 101 mg/dL — ABNORMAL HIGH (ref 65–99)
Potassium: 3.5 mmol/L (ref 3.5–5.1)
Sodium: 133 mmol/L — ABNORMAL LOW (ref 135–145)

## 2017-08-19 LAB — TYPE AND SCREEN
ABO/RH(D): O POS
Antibody Screen: NEGATIVE

## 2017-08-19 LAB — PROTIME-INR
INR: 1.04
Prothrombin Time: 13.5 seconds (ref 11.4–15.2)

## 2017-08-19 MED ORDER — FENTANYL CITRATE (PF) 100 MCG/2ML IJ SOLN
50.0000 ug | Freq: Once | INTRAMUSCULAR | Status: AC
  Administered 2017-08-19: 50 ug via INTRAVENOUS
  Filled 2017-08-19: qty 2

## 2017-08-19 MED ORDER — ONDANSETRON HCL 4 MG/2ML IJ SOLN
4.0000 mg | Freq: Once | INTRAMUSCULAR | Status: AC
  Administered 2017-08-19: 4 mg via INTRAVENOUS
  Filled 2017-08-19: qty 2

## 2017-08-19 NOTE — ED Provider Notes (Signed)
[facility] Emergency Department Provider Note  ____________________________________________  Time seen: Approximately 10:52 PM  I have reviewed the triage vital signs and the nursing notes.   HISTORY  Chief Complaint Fall    HPI Deborah Koch is a 61 y.o. female brought to the ED with a right elbow injury due to a trip and fall at home. Patient had been drinking excessively and then was trying to walk to the kitchen in the dark and tripped over the chocolate lab and fell onto her elbow. She also hit the left side of her head. Complains of a small amount of pain in her left face and severe pain in the right elbow. No other injuries. No aggravating or alleviating factors, severe intensity, constant.     Past Medical History:  Diagnosis Date  . Anxiety   . C. difficile colitis    a. 07/2014.  [name] Congenital bicuspid aortic valve   . Depression   . Dilated Ascending Aorta    a. 12/2013 Echo: 4.45cm.  . Diverticulitis    hospitalized; subsequent c diff infection.   [name] GERD (gastroesophageal reflux disease)   . H/O mastitis 2015  . History of tobacco abuse    a. 20 yrs, 1/4 ppd - quit.  [name] HTN (hypertension)   . Hypokalemia   . Moderate to severe aortic stenosis    a. 12/2013 Echo: EF 60-65%, Grade 1 DD, bicuspid AoV with mod-sev AS [3mmHg mean gradient, AoV area 0.69cm^2 (VTI)], mod dil ascending Ao - 4.45cm, mild TR, PASP 48mmHg.     Patient Active Problem List   Diagnosis Date Noted  . LLQ abdominal pain 12/26/2016  . History of Clostridium difficile infection 12/21/2015  . HTN (hypertension) 06/08/2015  . Moderate to severe aortic stenosis   . Congenital bicuspid aortic valve   . Ascending aorta dilation (HCC)   . Anxiety and depression 12/19/2013  . GERD (gastroesophageal reflux disease) 12/19/2013     Past Surgical History:  Procedure Laterality Date  . BARTHOLIN GLAND CYST EXCISION    . BREAST BIOPSY Left    neg  . COLONOSCOPY   2013   Dr. Allen Norris  . OOPHORECTOMY  2016  . OVARY SURGERY  2016   cyst on right ovary     Prior to Admission medications   Medication Sig Start Date End Date Taking? Authorizing Provider  clonazePAM (KLONOPIN) 0.5 MG tablet take 1 tablet by mouth twice a day 08/16/17   Burnard Hawthorne, FNP  escitalopram (LEXAPRO) 10 MG tablet take 1 tablet by mouth once daily 07/16/17   Burnard Hawthorne, FNP  lisinopril (PRINIVIL,ZESTRIL) 20 MG tablet Take 1 tablet (20 mg total) by mouth daily. 10/10/16   Burnard Hawthorne, FNP  metoprolol succinate (TOPROL-XL) 50 MG 24 hr tablet take 1 tablet by mouth once daily 07/11/17   Burnard Hawthorne, FNP  ranitidine (ZANTAC) 150 MG tablet Take 1 tablet (150 mg total) by mouth every morning. 08/03/16   Rainey Pines, MD     Allergies Vicodin [hydrocodone-acetaminophen]   Family History  Problem Relation Age of Onset  . Hypertension Mother   . Colon cancer Mother   . Anxiety disorder Mother   . Depression Mother   . Heart failure Father   . Hypertension Father   . Depression Sister   . Anxiety disorder Sister   . Alcohol abuse Brother   . Schizophrenia Brother   . Breast cancer Maternal Aunt   . Heart attack Neg Hx   .  Stroke Neg Hx     Social History Social History  Substance Use Topics  . Smoking status: Current Every Day Smoker    Packs/day: 0.25    Years: 20.00    Types: Cigarettes    Last attempt to quit: 06/07/2005  . Smokeless tobacco: Never Used  . Alcohol use 2.4 oz/week    2 Glasses of wine, 2 Cans of beer per week     Comment: occasional    Review of Systems  Constitutional:   No fever or chills.   Cardiovascular:   No chest pain or syncope. Respiratory:   No dyspnea or cough. Gastrointestinal:   Negative for abdominal pain, vomiting and diarrhea.  Musculoskeletal:   right elbow pain as above All other systems reviewed and are negative except as documented above in ROS and  HPI.  ____________________________________________   PHYSICAL EXAM:  VITAL SIGNS: ED Triage Vitals  Enc Vitals Group     BP 08/19/17 2132 133/87     Pulse Rate 08/19/17 2056 84     Resp 08/19/17 2056 (!) 22     Temp --      Temp src --      SpO2 08/19/17 2056 100 %     Weight 08/19/17 2057 135 lb (61.2 kg)     Height 08/19/17 2057 5\' 7"  (1.702 m)     Head Circumference --      Peak Flow --      Pain Score 08/19/17 2056 10     Pain Loc --      Pain Edu? --      Excl. in [place]? --     Vital signs reviewed, nursing assessments reviewed.   Constitutional:   Alert and oriented.anxious but not in distress Eyes:   No scleral icterus.  EOMI. No nystagmus. No conjunctival pallor. PERRL. ENT   Head:   Normocephalic and atraumatic.nontender   Nose:   No congestion/rhinnorhea. no epistaxis   Mouth/Throat:   MMM, no pharyngeal erythema. No peritonsillar mass. no intraoral injury   Neck:   No meningismus. Full ROM Hematological/Lymphatic/Immunilogical:   No cervical lymphadenopathy. Cardiovascular:   RRR. Symmetric bilateral radial and DP pulses.  No murmurs.  Respiratory:   Normal respiratory effort without tachypnea/retractions. Breath sounds are clear and equal bilaterally. No wheezes/rales/rhonchi. Gastrointestinal:   Soft and nontender. Non distended. There is no CVA tenderness.  No rebound, rigidity, or guarding. Genitourinary:   deferred Musculoskeletal:   Normal range of motion in all extremities. No joint effusions.  No lower extremity tenderness.  No edema.there is a golf ball sized hematoma on the lateral aspect of the right elbow at the epicondyle. No bony point tenderness. Full range of motion. Neurologic:   slurred speech.  Motor grossly intact. No gross focal neurologic deficits are appreciated.  Skin:    Skin is warm, dry and intact. No rash noted.  No petechiae, purpura, or bullae.hematoma as above. No  lacerations  ____________________________________________    LABS (pertinent positives/negatives) (all labs ordered are listed, but only abnormal results are displayed) Labs Reviewed  BASIC METABOLIC PANEL - Abnormal; Notable for the following:       Result Value   Sodium 133 (*)    Chloride 98 (*)    Glucose, Bld 101 (*)    All other components within normal limits  CBC WITH DIFFERENTIAL/PLATELET  PROTIME-INR  TYPE AND SCREEN   ____________________________________________   EKG    ____________________________________________    RADIOLOGY  Dg Elbow Complete Right  Result Date: 08/19/2017 CLINICAL DATA:  Obvious deformity of the right elbow status post trauma. EXAM: RIGHT ELBOW - COMPLETE 3+ VIEW COMPARISON:  None. FINDINGS: There is no evidence of fracture, dislocation, or joint effusion. There is no evidence of arthropathy or other focal bone abnormality. There is a large soft tissue hematoma next to proximal ulna IMPRESSION: No acute fracture or dislocation. Large soft tissue hematoma next to proximal ulna. Electronically Signed   By: Abelardo Diesel M.D.   On: 08/19/2017 21:25   Ct Head Wo Contrast  Result Date: 08/19/2017 CLINICAL DATA:  Patient fell after accidentally stepping on dog in the dark and hitting left side of head. EXAM: CT HEAD WITHOUT CONTRAST TECHNIQUE: Contiguous axial images were obtained from the base of the skull through the vertex without intravenous contrast. COMPARISON:  None. FINDINGS: Brain: No evidence of acute infarction, hemorrhage, hydrocephalus, extra-axial collection or mass lesion/mass effect. Vascular: No hyperdense vessel or unexpected calcification. Skull: Normal. Negative for fracture or focal lesion. Sinuses/Orbits: No acute finding. Other: Left parietal scalp contusion with soft tissue swelling. IMPRESSION: Left parietal scalp contusion without underlying fracture. No acute intracranial abnormality. Electronically Signed   By: Ashley Royalty M.D.    On: 08/19/2017 22:10    ____________________________________________   PROCEDURES Procedures  ____________________________________________   INITIAL IMPRESSION / ASSESSMENT AND PLAN / ED COURSE  Pertinent labs & imaging results that were available during my care of the patient were reviewed by me and considered in my medical decision making (see chart for details).    Clinical Course as of Aug 19 2254  Nancy Fetter Aug 19, 2017  2153 No fracture. F/u CT head, will be suitable for DC home with spouse if negative.  DG Elbow Complete Right [PS]    Clinical Course User Index [PS] Carrie Mew, MD     ----------------------------------------- 10:54 PM on 08/19/2017 -----------------------------------------  CT negative. Vital signs normal. Calm comfortable, suitable for outpatient follow-up.  ____________________________________________   FINAL CLINICAL IMPRESSION(S) / ED DIAGNOSES  Final diagnoses:  Fall in home, initial encounter  Hematoma  Alcoholic intoxication without complication ([place])  Contusion of scalp, initial encounter      New Prescriptions   No medications on file     Portions of this note were generated with dragon dictation software. Dictation errors may occur despite best attempts at proofreading.    Carrie Mew, MD 08/19/17 2255

## 2017-08-19 NOTE — Discharge Instructions (Signed)
Your CT scan of the head and xrays of the elbow did not show any serious injuries.  You have a hematoma (collection of blood under the skin) at your elbow, but no fractures.  Keep ice on the elbow through tomorrow to control swelling, then use a heating pad to help control pain.

## 2017-08-19 NOTE — ED Triage Notes (Signed)
Patient presents to Emergency Department via [facility] with complaints of fall.  Pt with obvious deformity to right elbow, pt reports tripping over dog and having several glasses of wine.    Pt denies LOC but reports striking left side of head - no contusion noted.  No blood thinners.

## 2017-08-19 NOTE — ED Notes (Signed)
Patient states she only had 3 glasses of wine but etoh smell is strong, patient is slurring speech and crying during parts of triage.  Patient repeatedly stating she fell because the "room was dark, that's the only reason I fell"

## 2017-08-19 NOTE — ED Notes (Signed)
Patient transported to CT 

## 2017-08-19 NOTE — ED Notes (Signed)
XR at bedside

## 2017-09-05 ENCOUNTER — Other Ambulatory Visit: Payer: Self-pay | Admitting: Psychiatry

## 2017-09-07 ENCOUNTER — Telehealth: Payer: Self-pay | Admitting: Gastroenterology

## 2017-09-07 NOTE — Telephone Encounter (Signed)
Patient LVM and would like to schedule her colonoscopy with Dr. Allen Norris on 10/22/17 if possible?

## 2017-09-10 ENCOUNTER — Telehealth: Payer: Self-pay | Admitting: Gastroenterology

## 2017-09-10 NOTE — Telephone Encounter (Signed)
Patient is ready to schedule her procedure. 

## 2017-09-10 NOTE — Telephone Encounter (Signed)
Returned patient's call to reschedule colonoscopy with Dr. Allen Norris in [facility].

## 2017-09-11 ENCOUNTER — Telehealth: Payer: Self-pay | Admitting: Gastroenterology

## 2017-09-11 ENCOUNTER — Telehealth: Payer: Self-pay

## 2017-09-11 NOTE — Telephone Encounter (Signed)
Patient wants her colonoscopy November 5. Please call her back to schedule. Please call patient to let her know. She is sorry she keeps missing your call. [place] or [place] as long as it is Dr. Allen Norris. You can leave a message with date.

## 2017-09-11 NOTE — Telephone Encounter (Signed)
Returned patient's call concerning scheduling procedure with Dr. Allen Norris.   LVM for callback.

## 2017-09-14 ENCOUNTER — Other Ambulatory Visit: Payer: Self-pay

## 2017-09-14 DIAGNOSIS — Z1212 Encounter for screening for malignant neoplasm of rectum: Principal | ICD-10-CM

## 2017-09-14 DIAGNOSIS — Z1211 Encounter for screening for malignant neoplasm of colon: Secondary | ICD-10-CM

## 2017-09-14 NOTE — Telephone Encounter (Signed)
Rescheduled colonoscopy for 11/5 with Dr. Allen Norris @ [place].  Re-mailed packet

## 2017-09-18 ENCOUNTER — Other Ambulatory Visit: Payer: Self-pay | Admitting: Family

## 2017-09-18 DIAGNOSIS — F32A Depression, unspecified: Secondary | ICD-10-CM

## 2017-09-18 DIAGNOSIS — F329 Major depressive disorder, single episode, unspecified: Secondary | ICD-10-CM

## 2017-09-18 DIAGNOSIS — F419 Anxiety disorder, unspecified: Principal | ICD-10-CM

## 2017-09-18 NOTE — Telephone Encounter (Signed)
Please advise, thanks.

## 2017-09-19 ENCOUNTER — Telehealth: Payer: Self-pay | Admitting: Family

## 2017-09-19 DIAGNOSIS — F419 Anxiety disorder, unspecified: Principal | ICD-10-CM

## 2017-09-19 DIAGNOSIS — F32A Depression, unspecified: Secondary | ICD-10-CM

## 2017-09-19 DIAGNOSIS — F329 Major depressive disorder, single episode, unspecified: Secondary | ICD-10-CM

## 2017-09-19 MED ORDER — CLONAZEPAM 0.5 MG PO TABS: 0.5000 mg | ORAL_TABLET | Freq: Two times a day (BID) | ORAL | 0 refills | Status: DC

## 2017-09-19 NOTE — Telephone Encounter (Signed)
Call pt  I refilled klonopin for ONE month- no further refills w/o being seen   Pt needs appt- loast 0v 12/2016  I looked up patient on [place] Controlled Substances Reporting System and saw no activity that raised concern of inappropriate use.

## 2017-09-20 ENCOUNTER — Other Ambulatory Visit: Payer: Self-pay | Admitting: Family

## 2017-09-20 DIAGNOSIS — E871 Hypo-osmolality and hyponatremia: Secondary | ICD-10-CM

## 2017-09-20 NOTE — Telephone Encounter (Signed)
mychart message has been sent

## 2017-10-11 ENCOUNTER — Encounter: Payer: Self-pay | Admitting: Anesthesiology

## 2017-10-11 ENCOUNTER — Encounter: Payer: Self-pay | Admitting: *Deleted

## 2017-10-12 ENCOUNTER — Other Ambulatory Visit: Payer: Self-pay

## 2017-10-12 ENCOUNTER — Telehealth: Payer: Self-pay

## 2017-10-12 DIAGNOSIS — Z1211 Encounter for screening for malignant neoplasm of colon: Secondary | ICD-10-CM

## 2017-10-12 NOTE — Telephone Encounter (Signed)
[facility] has requested colonoscopy to be moved to [facility] due to Aorta issues. Left vm to change date to Nov 6th.

## 2017-10-18 ENCOUNTER — Ambulatory Visit (INDEPENDENT_AMBULATORY_CARE_PROVIDER_SITE_OTHER): Payer: Medicare Other | Admitting: Family

## 2017-10-18 ENCOUNTER — Encounter: Payer: Self-pay | Admitting: Family

## 2017-10-18 VITALS — BP 142/80 | HR 72 | Temp 98.1°F | Ht 67.0 in | Wt 133.0 lb

## 2017-10-18 DIAGNOSIS — I10 Essential (primary) hypertension: Secondary | ICD-10-CM

## 2017-10-18 DIAGNOSIS — Z122 Encounter for screening for malignant neoplasm of respiratory organs: Secondary | ICD-10-CM

## 2017-10-18 DIAGNOSIS — Z0001 Encounter for general adult medical examination with abnormal findings: Secondary | ICD-10-CM

## 2017-10-18 DIAGNOSIS — F419 Anxiety disorder, unspecified: Secondary | ICD-10-CM

## 2017-10-18 DIAGNOSIS — Z0000 Encounter for general adult medical examination without abnormal findings: Secondary | ICD-10-CM

## 2017-10-18 DIAGNOSIS — F329 Major depressive disorder, single episode, unspecified: Secondary | ICD-10-CM | POA: Diagnosis not present

## 2017-10-18 DIAGNOSIS — F32A Depression, unspecified: Secondary | ICD-10-CM

## 2017-10-18 MED ORDER — CLONAZEPAM 0.5 MG PO TABS: 0.5000 mg | ORAL_TABLET | Freq: Two times a day (BID) | ORAL | 1 refills | Status: DC

## 2017-10-18 MED ORDER — ESCITALOPRAM OXALATE 10 MG PO TABS: 10.0000 mg | ORAL_TABLET | Freq: Every day | ORAL | 1 refills | Status: DC

## 2017-10-18 MED ORDER — METOPROLOL SUCCINATE ER 100 MG PO TB24: 100.0000 mg | ORAL_TABLET | Freq: Every day | ORAL | 3 refills | Status: DC

## 2017-10-18 NOTE — Assessment & Plan Note (Signed)
Elevated today. Will increase metoprol - patient preference over adding another antihypertensive. Will follow.

## 2017-10-18 NOTE — Assessment & Plan Note (Addendum)
CBE and pap performed. CT Chest ordered due to smoking.pending colonoscopy. Labs when fasting.

## 2017-10-18 NOTE — Progress Notes (Signed)
Pre visit review using our clinic review tool, if applicable. No additional management support is needed unless otherwise documented below in the visit note. 

## 2017-10-18 NOTE — Patient Instructions (Signed)
Labs when fasting  Pleasure seeing you  Health Maintenance, Female Adopting a healthy lifestyle and getting preventive care can go a long way to promote health and wellness. Talk with your health care provider about what schedule of regular examinations is right for you. This is a good chance for you to check in with your provider about disease prevention and staying healthy. In between checkups, there are plenty of things you can do on your own. Experts have done a lot of research about which lifestyle changes and preventive measures are most likely to keep you healthy. Ask your health care provider for more information. Weight and diet Eat a healthy diet  Be sure to include plenty of vegetables, fruits, low-fat dairy products, and lean protein.  Do not eat a lot of foods high in solid fats, added sugars, or salt.  Get regular exercise. This is one of the most important things you can do for your health. ? Most adults should exercise for at least 150 minutes each week. The exercise should increase your heart rate and make you sweat (moderate-intensity exercise). ? Most adults should also do strengthening exercises at least twice a week. This is in addition to the moderate-intensity exercise.  Maintain a healthy weight  Body mass index (BMI) is a measurement that can be used to identify possible weight problems. It estimates body fat based on height and weight. Your health care provider can help determine your BMI and help you achieve or maintain a healthy weight.  For females 62 years of age and older: ? A BMI below 18.5 is considered underweight. ? A BMI of 18.5 to 24.9 is normal. ? A BMI of 25 to 29.9 is considered overweight. ? A BMI of 30 and above is considered obese.  Watch levels of cholesterol and blood lipids  You should start having your blood tested for lipids and cholesterol at 62 years of age, then have this test every 5 years.  You may need to have your cholesterol  levels checked more often if: ? Your lipid or cholesterol levels are high. ? You are older than 62 years of age. ? You are at high risk for heart disease.  Cancer screening Lung Cancer  Lung cancer screening is recommended for adults 62-28 years old who are at high risk for lung cancer because of a history of smoking.  A yearly low-dose CT scan of the lungs is recommended for people who: ? Currently smoke. ? Have quit within the past 15 years. ? Have at least a 30-pack-year history of smoking. A pack year is smoking an average of one pack of cigarettes a day for 1 year.  Yearly screening should continue until it has been 15 years since you quit.  Yearly screening should stop if you develop a health problem that would prevent you from having lung cancer treatment.  Breast Cancer  Practice breast self-awareness. This means understanding how your breasts normally appear and feel.  It also means doing regular breast self-exams. Let your health care provider know about any changes, no matter how small.  If you are in your 20s or 30s, you should have a clinical breast exam (CBE) by a health care provider every 1-3 years as part of a regular health exam.  If you are 62 or older, have a CBE every year. Also consider having a breast X-ray (mammogram) every year.  If you have a family history of breast cancer, talk to your health care provider about genetic  screening.  If you are at high risk for breast cancer, talk to your health care provider about having an MRI and a mammogram every year.  Breast cancer gene (BRCA) assessment is recommended for women who have family members with BRCA-related cancers. BRCA-related cancers include: ? Breast. ? Ovarian. ? Tubal. ? Peritoneal cancers.  Results of the assessment will determine the need for genetic counseling and BRCA1 and BRCA2 testing.  Cervical Cancer Your health care provider may recommend that you be screened regularly for cancer of  the pelvic organs (ovaries, uterus, and vagina). This screening involves a pelvic examination, including checking for microscopic changes to the surface of your cervix (Pap test). You may be encouraged to have this screening done every 3 years, beginning at age 21.  For women ages 30-65, health care providers may recommend pelvic exams and Pap testing every 3 years, or they may recommend the Pap and pelvic exam, combined with testing for human papilloma virus (HPV), every 5 years. Some types of HPV increase your risk of cervical cancer. Testing for HPV may also be done on women of any age with unclear Pap test results.  Other health care providers may not recommend any screening for nonpregnant women who are considered low risk for pelvic cancer and who do not have symptoms. Ask your health care provider if a screening pelvic exam is right for you.  If you have had past treatment for cervical cancer or a condition that could lead to cancer, you need Pap tests and screening for cancer for at least 20 years after your treatment. If Pap tests have been discontinued, your risk factors (such as having a new sexual partner) need to be reassessed to determine if screening should resume. Some women have medical problems that increase the chance of getting cervical cancer. In these cases, your health care provider may recommend more frequent screening and Pap tests.  Colorectal Cancer  This type of cancer can be detected and often prevented.  Routine colorectal cancer screening usually begins at 62 years of age and continues through 62 years of age.  Your health care provider may recommend screening at an earlier age if you have risk factors for colon cancer.  Your health care provider may also recommend using home test kits to check for hidden blood in the stool.  A small camera at the end of a tube can be used to examine your colon directly (sigmoidoscopy or colonoscopy). This is done to check for the  earliest forms of colorectal cancer.  Routine screening usually begins at age 50.  Direct examination of the colon should be repeated every 5-10 years through 62 years of age. However, you may need to be screened more often if early forms of precancerous polyps or small growths are found.  Skin Cancer  Check your skin from head to toe regularly.  Tell your health care provider about any new moles or changes in moles, especially if there is a change in a mole's shape or color.  Also tell your health care provider if you have a mole that is larger than the size of a pencil eraser.  Always use sunscreen. Apply sunscreen liberally and repeatedly throughout the day.  Protect yourself by wearing long sleeves, pants, a wide-brimmed hat, and sunglasses whenever you are outside.  Heart disease, diabetes, and high blood pressure  High blood pressure causes heart disease and increases the risk of stroke. High blood pressure is more likely to develop in: ? People who   have blood pressure in the high end of the normal range (130-139/85-89 mm Hg). ? People who are overweight or obese. ? People who are African American.  If you are 19-29 years of age, have your blood pressure checked every 3-5 years. If you are 30 years of age or older, have your blood pressure checked every year. You should have your blood pressure measured twice-once when you are at a hospital or clinic, and once when you are not at a hospital or clinic. Record the average of the two measurements. To check your blood pressure when you are not at a hospital or clinic, you can use: ? An automated blood pressure machine at a pharmacy. ? A home blood pressure monitor.  If you are between 65 years and 63 years old, ask your health care provider if you should take aspirin to prevent strokes.  Have regular diabetes screenings. This involves taking a blood sample to check your fasting blood sugar level. ? If you are at a normal weight and  have a low risk for diabetes, have this test once every three years after 62 years of age. ? If you are overweight and have a high risk for diabetes, consider being tested at a younger age or more often. Preventing infection Hepatitis B  If you have a higher risk for hepatitis B, you should be screened for this virus. You are considered at high risk for hepatitis B if: ? You were born in a country where hepatitis B is common. Ask your health care provider which countries are considered high risk. ? Your parents were born in a high-risk country, and you have not been immunized against hepatitis B (hepatitis B vaccine). ? You have HIV or AIDS. ? You use needles to inject street drugs. ? You live with someone who has hepatitis B. ? You have had sex with someone who has hepatitis B. ? You get hemodialysis treatment. ? You take certain medicines for conditions, including cancer, organ transplantation, and autoimmune conditions.  Hepatitis C  Blood testing is recommended for: ? Everyone born from [birth year] through 1966. ? Anyone with known risk factors for hepatitis C.  Sexually transmitted infections (STIs)  You should be screened for sexually transmitted infections (STIs) including gonorrhea and chlamydia if: ? You are sexually active and are younger than 62 years of age. ? You are older than 62 years of age and your health care provider tells you that you are at risk for this type of infection. ? Your sexual activity has changed since you were last screened and you are at an increased risk for chlamydia or gonorrhea. Ask your health care provider if you are at risk.  If you do not have HIV, but are at risk, it may be recommended that you take a prescription medicine daily to prevent HIV infection. This is called pre-exposure prophylaxis (PrEP). You are considered at risk if: ? You are sexually active and do not regularly use condoms or know the HIV status of your partner(s). ? You take drugs by  injection. ? You are sexually active with a partner who has HIV.  Talk with your health care provider about whether you are at high risk of being infected with HIV. If you choose to begin PrEP, you should first be tested for HIV. You should then be tested every 3 months for as long as you are taking PrEP. Pregnancy  If you are premenopausal and you may become pregnant, ask your health care provider  about preconception counseling.  If you may become pregnant, take 400 to 800 micrograms (mcg) of folic acid every day.  If you want to prevent pregnancy, talk to your health care provider about birth control (contraception). Osteoporosis and menopause  Osteoporosis is a disease in which the bones lose minerals and strength with aging. This can result in serious bone fractures. Your risk for osteoporosis can be identified using a bone density scan.  If you are 6 years of age or older, or if you are at risk for osteoporosis and fractures, ask your health care provider if you should be screened.  Ask your health care provider whether you should take a calcium or vitamin D supplement to lower your risk for osteoporosis.  Menopause may have certain physical symptoms and risks.  Hormone replacement therapy may reduce some of these symptoms and risks. Talk to your health care provider about whether hormone replacement therapy is right for you. Follow these instructions at home:  Schedule regular health, dental, and eye exams.  Stay current with your immunizations.  Do not use any tobacco products including cigarettes, chewing tobacco, or electronic cigarettes.  If you are pregnant, do not drink alcohol.  If you are breastfeeding, limit how much and how often you drink alcohol.  Limit alcohol intake to no more than 1 drink per day for nonpregnant women. One drink equals 12 ounces of beer, 5 ounces of wine, or 1 ounces of hard liquor.  Do not use street drugs.  Do not share needles.  Ask  your health care provider for help if you need support or information about quitting drugs.  Tell your health care provider if you often feel depressed.  Tell your health care provider if you have ever been abused or do not feel safe at home. This information is not intended to replace advice given to you by your health care provider. Make sure you discuss any questions you have with your health care provider. Document Released: 06/19/2011 Document Revised: 05/11/2016 Document Reviewed: 09/07/2015 Elsevier Interactive Patient Education  [employer].

## 2017-10-18 NOTE — Assessment & Plan Note (Signed)
Doing well on medications. Declines increase of lexapro. Please cutting back on klonopin. 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 Will follow.

## 2017-10-18 NOTE — Progress Notes (Signed)
Subjective:    Patient ID: Deborah Koch, female    DOB: July 08, 1956, 61 y.o.   MRN: [id number]  CC: Deborah Koch is a 61 y.o. female who presents today for physical exam.    HPI: GAD- taking 0.5mg  klonopin in morning and has been decreasing/skipping dose at night; anxiety improved. No thoughts of hurting herself or anyone else.   HTN- compliant with medications. Denies exertional chest pain or pressure, numbness or tingling radiating to left arm or jaw, palpitations, dizziness, frequent headaches, changes in vision, or shortness of breath.      Colorectal Cancer Screening: scheduled next week.  Breast Cancer Screening: Mammogram UTD Cervical Cancer Screening: due Bone Health screening/DEXA for 65+: No increased fracture risk. Defer screening at this time.NO vaginal bleeding.  Lung Cancer Screening: has 30 year pack year history and age > 52 years.       Tetanus - due     Labs: Screening labs future; doesn't want repeat labs from being in ED 08/2017 Exercise: Gets regular exercise.  Alcohol use: occasional.  Smoking/tobacco use: smoker.  Regular dental exams: UTD Wears seat belt: Yes. Skin: concerned for  HISTORY:  Past Medical History:  Diagnosis Date  . Anxiety   . C. difficile colitis    a. 07/2014.  [name] Congenital bicuspid aortic valve   . Depression   . Dilated Ascending Aorta    a. 12/2013 Echo: 4.45cm.  . Diverticulitis    hospitalized; subsequent c diff infection.   [name] GERD (gastroesophageal reflux disease)   . H/O mastitis 2015  . Heart murmur   . History of tobacco abuse    a. 20 yrs, 1/4 ppd - quit.  [name] HTN (hypertension)   . Hypokalemia   . Moderate to severe aortic stenosis    a. 12/2013 Echo: EF 60-65%, Grade 1 DD, bicuspid AoV with mod-sev AS [53mmHg mean gradient, AoV area 0.69cm^2 (VTI)], mod dil ascending Ao - 4.45cm, mild TR, PASP 66mmHg.  . Multilevel degenerative disc disease   . Scoliosis     Past Surgical History:  Procedure  Laterality Date  . BARTHOLIN GLAND CYST EXCISION    . BREAST BIOPSY Left    neg  . COLONOSCOPY  2013   Dr. Allen Norris  . OOPHORECTOMY  2016  . OVARY SURGERY  2016   cyst on right ovary   Family History  Problem Relation Age of Onset  . Hypertension Mother   . Colon cancer Mother   . Anxiety disorder Mother   . Depression Mother   . Heart failure Father   . Hypertension Father   . Depression Sister   . Anxiety disorder Sister   . Alcohol abuse Brother   . Schizophrenia Brother   . Breast cancer Maternal Aunt   . Heart attack Neg Hx   . Stroke Neg Hx       ALLERGIES: Vicodin [hydrocodone-acetaminophen]  Current Outpatient Prescriptions on File Prior to Visit  Medication Sig Dispense Refill  . lisinopril (PRINIVIL,ZESTRIL) 20 MG tablet take 1 tablet by mouth once daily 90 tablet 3  . ranitidine (ZANTAC) 150 MG tablet Take 1 tablet (150 mg total) by mouth every morning. 90 tablet 2   No current facility-administered medications on file prior to visit.     Social History  Substance Use Topics  . Smoking status: Current Every Day Smoker    Packs/day: 0.25    Years: 20.00    Types: Cigarettes    Last attempt to quit: 06/07/2005  .  Smokeless tobacco: Never Used  . Alcohol use 7.2 oz/week    2 Cans of beer, 10 Glasses of wine per week     Comment:      Review of Systems  Constitutional: Negative for chills, fever and unexpected weight change.  HENT: Negative for congestion.   Respiratory: Negative for cough.   Cardiovascular: Negative for chest pain, palpitations and leg swelling.  Gastrointestinal: Negative for nausea and vomiting.  Musculoskeletal: Negative for arthralgias and myalgias.  Skin: Negative for rash.  Neurological: Negative for headaches.  Hematological: Negative for adenopathy.  Psychiatric/Behavioral: Negative for confusion. The patient is nervous/anxious.       Objective:    BP (!) 142/80   Pulse 72   Temp 98.1 F (36.7 C) (Oral)   Ht 5\' 7"   (1.702 m)   Wt 133 lb (60.3 kg)   SpO2 98%   BMI 20.83 kg/m   BP Readings from Last 3 Encounters:  10/18/17 (!) 142/80  08/19/17 130/70  05/01/17 120/84   Wt Readings from Last 3 Encounters:  10/18/17 133 lb (60.3 kg)  08/19/17 135 lb (61.2 kg)  05/01/17 129 lb 4 oz (58.6 kg)    Physical Exam  Constitutional: She appears well-developed and well-nourished.  Eyes: Conjunctivae are normal.  Neck: No thyroid mass and no thyromegaly present.  Cardiovascular: Normal rate, regular rhythm, normal heart sounds and normal pulses.   Pulmonary/Chest: Effort normal and breath sounds normal. She has no wheezes. She has no rhonchi. She has no rales. Right breast exhibits no inverted nipple, no mass, no nipple discharge, no skin change and no tenderness. Left breast exhibits no inverted nipple, no mass, no nipple discharge, no skin change and no tenderness. Breasts are symmetrical.  No masses or asymmetry appreciated during CBE.  Genitourinary: Uterus is not enlarged, not fixed and not tender. Cervix exhibits no motion tenderness, no discharge and no friability. Right adnexum displays no mass, no tenderness and no fullness. Left adnexum displays no mass, no tenderness and no fullness.  Genitourinary Comments: Pap performed. No CMT. Unable to appreciated ovaries.  Lymphadenopathy:       Head (right side): No submental, no submandibular, no tonsillar, no preauricular, no posterior auricular and no occipital adenopathy present.       Head (left side): No submental, no submandibular, no tonsillar, no preauricular, no posterior auricular and no occipital adenopathy present.       Right cervical: No superficial cervical, no deep cervical and no posterior cervical adenopathy present.      Left cervical: No superficial cervical, no deep cervical and no posterior cervical adenopathy present.    She has no axillary adenopathy.       Right axillary: No pectoral and no lateral adenopathy present.       Left  axillary: No pectoral and no lateral adenopathy present. Neurological: She is alert.  Skin: Skin is warm and dry.  Psychiatric: She has a normal mood and affect. Her speech is normal and behavior is normal. Thought content normal.  Vitals reviewed.      Assessment & Plan:   Problem List Items Addressed This Visit      Cardiovascular and Mediastinum   HTN (hypertension)    Elevated today. Will increase metoprol - patient preference over adding another antihypertensive. Will follow.       Relevant Medications   metoprolol succinate (TOPROL-XL) 100 MG 24 hr tablet     Other   Anxiety and depression    Doing well on  medications. Declines increase of lexapro. Please cutting back on klonopin. 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 Will follow.       Relevant Medications   clonazePAM (KLONOPIN) 0.5 MG tablet   escitalopram (LEXAPRO) 10 MG tablet   Other Relevant Orders   Ambulatory referral to Psychology   Routine physical examination - Primary    CBE and pap performed. CT Chest ordered due to smoking.pending colonoscopy. Labs when fasting.        Relevant Orders   Hemoglobin A1c   Lipid panel   TSH   VITAMIN D 25 Hydroxy (Vit-D Deficiency, Fractures)   Ambulatory referral to Dermatology   CT CHEST LUNG CA SCREEN LOW DOSE W/O CM    Other Visit Diagnoses    Encounter for screening for malignant neoplasm of respiratory organs       Relevant Orders   CT CHEST LUNG CA SCREEN LOW DOSE W/O CM       I have discontinued Ms. Hern's metoprolol succinate. I have also changed her escitalopram. Additionally, I am having her start on metoprolol succinate. Lastly, I am having her maintain her ranitidine, lisinopril, and clonazePAM.   Meds ordered this encounter  Medications  . clonazePAM (KLONOPIN) 0.5 MG tablet    Sig: Take 1 tablet (0.5 mg total) by mouth 2 (two) times daily.    Dispense:  60 tablet    Refill:  1    Do not fill  before 09/19/2017    Order Specific Question:   Supervising Provider    Answer:   Deborra Medina L [[id number]]  . escitalopram (LEXAPRO) 10 MG tablet    Sig: Take 1 tablet (10 mg total) by mouth daily.    Dispense:  90 tablet    Refill:  1    Order Specific Question:   Supervising Provider    Answer:   Deborra Medina L [[id number]]  . metoprolol succinate (TOPROL-XL) 100 MG 24 hr tablet    Sig: Take 1 tablet (100 mg total) by mouth daily. Take with or immediately following a meal.    Dispense:  90 tablet    Refill:  3    Order Specific Question:   Supervising Provider    Answer:   Crecencio Mc [[id number]]    Return precautions given.   Risks, benefits, and alternatives of the medications and treatment plan prescribed today were discussed, and patient expressed understanding.   Education regarding symptom management and diagnosis given to patient on AVS.   Continue to follow with Burnard Hawthorne, FNP for routine health maintenance.   [name] and I agreed with plan.   Mable Paris, FNP   I spent 15 min face to face w/ pt. Of which greater than 50% of time was spent discussing stress the family life, current caregiver and marital distress, as well as medication, counseling therapies.

## 2017-10-19 ENCOUNTER — Other Ambulatory Visit (HOSPITAL_COMMUNITY)
Admission: RE | Admit: 2017-10-19 | Discharge: 2017-10-19 | Disposition: A | Discharge status: Home / Self Care | Payer: Medicare Other | Source: Ambulatory Visit | Attending: Family | Admitting: Family

## 2017-10-19 DIAGNOSIS — Z0000 Encounter for general adult medical examination without abnormal findings: Secondary | ICD-10-CM | POA: Diagnosis present

## 2017-10-19 NOTE — Addendum Note (Signed)
Addended by: Elpidio Galea T on: 10/19/2017 08:09 AM   Modules accepted: Orders

## 2017-10-22 ENCOUNTER — Ambulatory Visit: Admit: 2017-10-22 | Payer: Medicare Other | Admitting: Gastroenterology

## 2017-10-22 HISTORY — DX: Scoliosis, unspecified: M41.9

## 2017-10-22 HISTORY — DX: Dorsopathy, unspecified: M53.9

## 2017-10-22 LAB — CYTOLOGY - PAP
Diagnosis: NEGATIVE
HPV: NOT DETECTED

## 2017-10-22 SURGERY — COLONOSCOPY WITH PROPOFOL
Anesthesia: Choice

## 2017-10-23 ENCOUNTER — Ambulatory Visit: Payer: Medicare Other | Admitting: Anesthesiology

## 2017-10-23 ENCOUNTER — Ambulatory Visit
Admission: RE | Admit: 2017-10-23 | Discharge: 2017-10-23 | Disposition: A | Discharge status: Home / Self Care | Payer: Medicare Other | Source: Ambulatory Visit | Attending: Gastroenterology | Admitting: Gastroenterology

## 2017-10-23 ENCOUNTER — Encounter
Admission: RE | Disposition: A | Discharge status: Home / Self Care | Payer: Self-pay | Source: Ambulatory Visit | Attending: Gastroenterology

## 2017-10-23 ENCOUNTER — Other Ambulatory Visit: Payer: Self-pay

## 2017-10-23 ENCOUNTER — Encounter: Payer: Self-pay | Admitting: Anesthesiology

## 2017-10-23 DIAGNOSIS — F419 Anxiety disorder, unspecified: Secondary | ICD-10-CM | POA: Insufficient documentation

## 2017-10-23 DIAGNOSIS — K640 First degree hemorrhoids: Secondary | ICD-10-CM | POA: Insufficient documentation

## 2017-10-23 DIAGNOSIS — Z860100 Personal history of colon polyps, unspecified: Secondary | ICD-10-CM

## 2017-10-23 DIAGNOSIS — Z888 Allergy status to other drugs, medicaments and biological substances status: Secondary | ICD-10-CM | POA: Insufficient documentation

## 2017-10-23 DIAGNOSIS — M1990 Unspecified osteoarthritis, unspecified site: Secondary | ICD-10-CM | POA: Insufficient documentation

## 2017-10-23 DIAGNOSIS — D122 Benign neoplasm of ascending colon: Secondary | ICD-10-CM

## 2017-10-23 DIAGNOSIS — I739 Peripheral vascular disease, unspecified: Secondary | ICD-10-CM | POA: Diagnosis not present

## 2017-10-23 DIAGNOSIS — I10 Essential (primary) hypertension: Secondary | ICD-10-CM | POA: Diagnosis not present

## 2017-10-23 DIAGNOSIS — F17210 Nicotine dependence, cigarettes, uncomplicated: Secondary | ICD-10-CM | POA: Insufficient documentation

## 2017-10-23 DIAGNOSIS — K219 Gastro-esophageal reflux disease without esophagitis: Secondary | ICD-10-CM | POA: Insufficient documentation

## 2017-10-23 DIAGNOSIS — Z1211 Encounter for screening for malignant neoplasm of colon: Secondary | ICD-10-CM

## 2017-10-23 DIAGNOSIS — K5730 Diverticulosis of large intestine without perforation or abscess without bleeding: Secondary | ICD-10-CM | POA: Diagnosis not present

## 2017-10-23 DIAGNOSIS — Z09 Encounter for follow-up examination after completed treatment for conditions other than malignant neoplasm: Secondary | ICD-10-CM | POA: Diagnosis present

## 2017-10-23 DIAGNOSIS — Z79899 Other long term (current) drug therapy: Secondary | ICD-10-CM | POA: Insufficient documentation

## 2017-10-23 DIAGNOSIS — F329 Major depressive disorder, single episode, unspecified: Secondary | ICD-10-CM | POA: Insufficient documentation

## 2017-10-23 HISTORY — PX: COLONOSCOPY WITH PROPOFOL: SHX5780

## 2017-10-23 SURGERY — COLONOSCOPY WITH PROPOFOL
Anesthesia: General

## 2017-10-23 MED ORDER — SODIUM CHLORIDE 0.9 % IV SOLN
INTRAVENOUS | Status: DC
  Administered 2017-10-23: 11:00:00 via INTRAVENOUS

## 2017-10-23 MED ORDER — LIDOCAINE HCL (PF) 1 % IJ SOLN
INTRAMUSCULAR | Status: AC
  Filled 2017-10-23: qty 2

## 2017-10-23 MED ORDER — LIDOCAINE HCL (CARDIAC) 20 MG/ML IV SOLN
INTRAVENOUS | Status: DC | PRN
  Administered 2017-10-23: 80 mg via INTRAVENOUS

## 2017-10-23 MED ORDER — PROPOFOL 10 MG/ML IV BOLUS
INTRAVENOUS | Status: DC | PRN
  Administered 2017-10-23: 50 mg via INTRAVENOUS
  Administered 2017-10-23 (×2): 20 mg via INTRAVENOUS

## 2017-10-23 MED ORDER — PROPOFOL 500 MG/50ML IV EMUL
INTRAVENOUS | Status: DC | PRN
  Administered 2017-10-23: 120 ug/kg/min via INTRAVENOUS

## 2017-10-23 MED ORDER — PHENYLEPHRINE HCL 10 MG/ML IJ SOLN
INTRAMUSCULAR | Status: DC | PRN
  Administered 2017-10-23 (×2): 100 ug via INTRAVENOUS

## 2017-10-23 NOTE — Anesthesia Post-op Follow-up Note (Signed)
Anesthesia QCDR form completed.        

## 2017-10-23 NOTE — H&P (Signed)
Lucilla Lame, MD [facility] [address] [place], [place] [address] Phone:[phone] Fax : [phone]  Primary Care Physician:  Burnard Hawthorne, FNP Primary Gastroenterologist:  Dr. Allen Norris  Pre-Procedure History & Physical: HPI:  Deborah Koch is a 61 y.o. female is here for an colonoscopy.   Past Medical History:  Diagnosis Date  . Anxiety   . C. difficile colitis    a. 07/2014.  [name] Congenital bicuspid aortic valve   . Depression   . Dilated Ascending Aorta    a. 12/2013 Echo: 4.45cm.  . Diverticulitis    hospitalized; subsequent c diff infection.   [name] GERD (gastroesophageal reflux disease)   . H/O mastitis 2015  . Heart murmur   . History of tobacco abuse    a. 20 yrs, 1/4 ppd - quit.  [name] HTN (hypertension)   . Hypokalemia   . Moderate to severe aortic stenosis    a. 12/2013 Echo: EF 60-65%, Grade 1 DD, bicuspid AoV with mod-sev AS [76mmHg mean gradient, AoV area 0.69cm^2 (VTI)], mod dil ascending Ao - 4.45cm, mild TR, PASP 26mmHg.  . Multilevel degenerative disc disease   . Scoliosis     Past Surgical History:  Procedure Laterality Date  . BARTHOLIN GLAND CYST EXCISION    . BREAST BIOPSY Left    neg  . COLONOSCOPY  2013   Dr. Allen Norris  . OOPHORECTOMY  2016  . OVARY SURGERY  2016   cyst on right ovary    Prior to Admission medications   Medication Sig Start Date End Date Taking? Authorizing Provider  clonazePAM (KLONOPIN) 0.5 MG tablet Take 1 tablet (0.5 mg total) by mouth 2 (two) times daily. 10/18/17  Yes Burnard Hawthorne, FNP  escitalopram (LEXAPRO) 10 MG tablet Take 1 tablet (10 mg total) by mouth daily. 10/18/17  Yes Burnard Hawthorne, FNP  lisinopril (PRINIVIL,ZESTRIL) 20 MG tablet take 1 tablet by mouth once daily 09/20/17  Yes Arnett, [name], FNP  metoprolol succinate (TOPROL-XL) 100 MG 24 hr tablet Take 1 tablet (100 mg total) by mouth daily. Take with or immediately following a meal. 10/18/17  Yes Arnett, [name], FNP  ranitidine  (ZANTAC) 150 MG tablet Take 1 tablet (150 mg total) by mouth every morning. 08/03/16  Yes Rainey Pines, MD    Allergies as of 10/12/2017 - Review Complete 08/19/2017  Allergen Reaction Noted  . Vicodin [hydrocodone-acetaminophen] Hives and Rash 06/08/2015    Family History  Problem Relation Age of Onset  . Hypertension Mother   . Colon cancer Mother   . Anxiety disorder Mother   . Depression Mother   . Heart failure Father   . Hypertension Father   . Depression Sister   . Anxiety disorder Sister   . Alcohol abuse Brother   . Schizophrenia Brother   . Breast cancer Maternal Aunt   . Heart attack Neg Hx   . Stroke Neg Hx     Social History   Socioeconomic History  . Marital status: Married    Spouse name: Not on file  . Number of children: Not on file  . Years of education: Not on file  . Highest education level: Not on file  Social Needs  . Financial resource strain: Not on file  . Food insecurity - worry: Not on file  . Food insecurity - inability: Not on file  . Transportation needs - medical: Not on file  . Transportation needs - non-medical: Not on file  Occupational History  .  Not on file  Tobacco Use  . Smoking status: Current Every Day Smoker    Packs/day: 0.25    Years: 20.00    Pack years: 5.00    Types: Cigarettes    Last attempt to quit: 06/07/2005    Years since quitting: 12.3  . Smokeless tobacco: Never Used  Substance and Sexual Activity  . Alcohol use: Yes    Alcohol/week: 7.2 oz    Types: 2 Cans of beer, 10 Glasses of wine per week    Comment:    . Drug use: No  . Sexual activity: Yes    Birth control/protection: None  Other Topics Concern  . Not on file  Social History Narrative   From [place] and lives here      Sister is patient of mine      Divorced ( husband had drug addiction) and since remarried a man from her highschool days    2 children who live in [place]      Not working since she got C.diff infection 2015 while working  at [employer].      Started yoga    Review of Systems: See HPI, otherwise negative ROS  Physical Exam: BP (!) 117/94   Pulse 66   Temp (!) 96.4 F (35.8 C) (Tympanic)   Resp 18   SpO2 99%  General:   Alert,  pleasant and cooperative in NAD Head:  Normocephalic and atraumatic. Neck:  Supple; no masses or thyromegaly. Lungs:  Clear throughout to auscultation.    Heart:  Regular rate and rhythm. Abdomen:  Soft, nontender and nondistended. Normal bowel sounds, without guarding, and without rebound.   Neurologic:  Alert and  oriented x4;  grossly normal neurologically.  Impression/Plan: Deborah Koch is here for an colonoscopy to be performed for history of colon polyps  Risks, benefits, limitations, and alternatives regarding  colonoscopy have been reviewed with the patient.  Questions have been answered.  All parties agreeable.   Lucilla Lame, MD  10/23/2017, 10:48 AM

## 2017-10-23 NOTE — Anesthesia Preprocedure Evaluation (Signed)
Anesthesia Evaluation  Patient identified by MRN, date of birth, ID band Patient awake    Reviewed: Allergy & Precautions, H&P , NPO status , Patient's Chart, lab work & pertinent test results  History of Anesthesia Complications Negative for: history of anesthetic complications  Airway Mallampati: III  TM Distance: <3 FB Neck ROM: full    Dental  (+) Chipped   Pulmonary neg shortness of breath, Current Smoker,           Cardiovascular Exercise Tolerance: Good hypertension, (-) angina+ Peripheral Vascular Disease  (-) Past MI + Valvular Problems/Murmurs AS      Neuro/Psych PSYCHIATRIC DISORDERS Anxiety Depression negative neurological ROS     GI/Hepatic Neg liver ROS, GERD  Medicated and Controlled,  Endo/Other  negative endocrine ROS  Renal/GU negative Renal ROS  negative genitourinary   Musculoskeletal  (+) Arthritis ,   Abdominal   Peds  Hematology negative hematology ROS (+)   Anesthesia Other Findings Past Medical History: No date: Anxiety No date: C. difficile colitis     Comment:  a. 07/2014. No date: Congenital bicuspid aortic valve No date: Depression No date: Dilated Ascending Aorta     Comment:  a. 12/2013 Echo: 4.45cm. No date: Diverticulitis     Comment:  hospitalized; subsequent c diff infection.  No date: GERD (gastroesophageal reflux disease) 2015: H/O mastitis No date: Heart murmur No date: History of tobacco abuse     Comment:  a. 20 yrs, 1/4 ppd - quit. No date: HTN (hypertension) No date: Hypokalemia No date: Moderate to severe aortic stenosis     Comment:  a. 12/2013 Echo: EF 60-65%, Grade 1 DD, bicuspid AoV with              mod-sev AS (35mmHg mean gradient, AoV area 0.69cm^2               (VTI)), mod dil ascending Ao - 4.45cm, mild TR, PASP               43mmHg. No date: Multilevel degenerative disc disease No date: Scoliosis  Past Surgical History: No date: BARTHOLIN GLAND CYST  EXCISION No date: BREAST BIOPSY; Left     Comment:  neg 2013: COLONOSCOPY     Comment:  Dr. Allen Norris 2016: OOPHORECTOMY 2016: OVARY SURGERY     Comment:  cyst on right ovary     Reproductive/Obstetrics negative OB ROS                             Anesthesia Physical Anesthesia Plan  ASA: III  Anesthesia Plan: General   Post-op Pain Management:    Induction: Intravenous  PONV Risk Score and Plan: 2 and Propofol infusion  Airway Management Planned: Natural Airway and Nasal Cannula  Additional Equipment:   Intra-op Plan:   Post-operative Plan:   Informed Consent: I have reviewed the patients History and Physical, chart, labs and discussed the procedure including the risks, benefits and alternatives for the proposed anesthesia with the patient or authorized representative who has indicated his/her understanding and acceptance.   Dental Advisory Given  Plan Discussed with: Anesthesiologist, CRNA and Surgeon  Anesthesia Plan Comments: (Patient consented for risks of anesthesia including but not limited to:  - adverse reactions to medications - risk of intubation if required - damage to teeth, lips or other oral mucosa - sore throat or hoarseness - Damage to heart, brain, lungs or loss of life  Patient voiced understanding.)  Anesthesia Quick Evaluation  

## 2017-10-23 NOTE — Transfer of Care (Signed)
Immediate Anesthesia Transfer of Care Note  Patient: Deborah Koch  Procedure(s) Performed: COLONOSCOPY WITH PROPOFOL (N/A )  Patient Location: PACU and Endoscopy Unit  Anesthesia Type:General  Level of Consciousness: awake, alert , oriented and patient cooperative  Airway & Oxygen Therapy: Patient Spontanous Breathing and Patient connected to nasal cannula oxygen  Post-op Assessment: Report given to RN, Post -op Vital signs reviewed and stable and Patient moving all extremities  Post vital signs: Reviewed and stable  Last Vitals:  Vitals:   10/23/17 0919 10/23/17 1123  BP: (!) 117/94 113/75  Pulse: 66 64  Resp: 18 15  Temp: (!) 35.8 C 37.2 C  SpO2: 99% 100%    Last Pain:  Vitals:   10/23/17 1123  TempSrc: Tympanic         Complications: No apparent anesthesia complications

## 2017-10-23 NOTE — Op Note (Signed)
[facility] Patient Name: Deborah Koch Procedure Date: 10/23/2017 10:43 AM MRN: [id number] Account #: [id number] Date of Birth: 1956-07-22 Admit Type: Outpatient Age: 61 Room: [facility] ENDO ROOM 4 Gender: Female Note Status: Finalized Procedure:            Colonoscopy Indications:          High risk colon cancer surveillance: Personal history                        of colonic polyps Providers:            Lucilla Lame MD, MD Referring MD:         [name]. Arnett (Referring MD) Medicines:            Propofol per Anesthesia Complications:        No immediate complications. Procedure:            Pre-Anesthesia Assessment:                       - Prior to the procedure, a History and Physical was                        performed, and patient medications and allergies were                        reviewed. The patient's tolerance of previous                        anesthesia was also reviewed. The risks and benefits of                        the procedure and the sedation options and risks were                        discussed with the patient. All questions were                        answered, and informed consent was obtained. Prior                        Anticoagulants: The patient has taken no previous                        anticoagulant or antiplatelet agents. ASA Grade                        Assessment: II - A patient with mild systemic disease.                        After reviewing the risks and benefits, the patient was                        deemed in satisfactory condition to undergo the                        procedure.                       After obtaining informed consent, the colonoscope was  passed under direct vision. Throughout the procedure,                        the patient's blood pressure, pulse, and oxygen                        saturations were monitored continuously. The Olympus   CF-H180AL colonoscope ( S#: [id number] ) was introduced                        through the anus and advanced to the the cecum,                        identified by appendiceal orifice and ileocecal valve.                        The colonoscopy was performed without difficulty. The                        patient tolerated the procedure well. The quality of                        the bowel preparation was excellent. Findings:      The perianal and digital rectal examinations were normal.      Two sessile polyps were found in the ascending colon. The polyps were 2       to 3 mm in size. These polyps were removed with a cold biopsy forceps.       Resection and retrieval were complete.      Multiple small-mouthed diverticula were found in the sigmoid colon.      Non-bleeding internal hemorrhoids were found during retroflexion. The       hemorrhoids were Grade I (internal hemorrhoids that do not prolapse). Impression:           - Two 2 to 3 mm polyps in the ascending colon, removed                        with a cold biopsy forceps. Resected and retrieved.                       - Diverticulosis in the sigmoid colon.                       - Non-bleeding internal hemorrhoids. Recommendation:       - Discharge patient to home.                       - Resume previous diet.                       - Continue present medications.                       - Repeat colonoscopy in 5 years for surveillance. Procedure Code(s):    --- Professional ---                       [phone], Colonoscopy, flexible; with biopsy, single or                        multiple Diagnosis Code(s):    ---  Professional ---                       Z86.010, Personal history of colonic polyps                       D12.2, Benign neoplasm of ascending colon CPT copyright 2016 American Medical Association. All rights reserved. The codes documented in this report are preliminary and upon coder review may  be revised to meet current compliance  requirements. Lucilla Lame MD, MD 10/23/2017 11:18:33 AM This report has been signed electronically. Number of Addenda: 0 Note Initiated On: 10/23/2017 10:43 AM Scope Withdrawal Time: 0 hours 7 minutes 12 seconds  Total Procedure Duration: 0 hours 17 minutes 32 seconds       [facility]

## 2017-10-23 NOTE — Anesthesia Postprocedure Evaluation (Signed)
Anesthesia Post Note  Patient: Deborah Koch  Procedure(s) Performed: COLONOSCOPY WITH PROPOFOL (N/A )  Patient location during evaluation: Endoscopy Anesthesia Type: General Level of consciousness: awake and alert Pain management: pain level controlled Vital Signs Assessment: post-procedure vital signs reviewed and stable Respiratory status: spontaneous breathing, nonlabored ventilation, respiratory function stable and patient connected to nasal cannula oxygen Cardiovascular status: blood pressure returned to baseline and stable Postop Assessment: no apparent nausea or vomiting Anesthetic complications: no     Last Vitals:  Vitals:   10/23/17 1133 10/23/17 1143  BP: 116/80 110/75  Pulse: (!) 59 61  Resp: 12 (!) 21  Temp:    SpO2: 99% 100%    Last Pain:  Vitals:   10/23/17 1123  TempSrc: Tympanic                 Precious Haws Demetri Goshert

## 2017-10-24 ENCOUNTER — Encounter: Payer: Self-pay | Admitting: Gastroenterology

## 2017-10-24 LAB — SURGICAL PATHOLOGY

## 2017-10-25 ENCOUNTER — Encounter: Payer: Self-pay | Admitting: Gastroenterology

## 2017-10-25 ENCOUNTER — Telehealth: Payer: Self-pay | Admitting: *Deleted

## 2017-10-25 NOTE — Telephone Encounter (Signed)
Received referral for initial lung cancer screening scan. Contacted patient and obtained smoking history,(current, 46 years x .25ppd). Due to smoking history of less than 30 pack years patient is not eligible for lung screening at this time. Discussed at length with patient possible screening in the future if she continues to smoke as well as encouraging smoking cessation and discussion of programs available to assist. Patient verbalizes understanding.

## 2017-10-29 ENCOUNTER — Telehealth: Payer: Self-pay | Admitting: Family

## 2017-10-29 NOTE — Telephone Encounter (Signed)
Paperwork has been placed in provider box.

## 2017-10-29 NOTE — Telephone Encounter (Addendum)
Pt needs jury duty form filled out stating that she is unable to do. Pt has been on disability since 2015 Please advise pt when ready Placed in Arnetts colored folder upfront

## 2017-10-31 ENCOUNTER — Telehealth: Payer: Self-pay | Admitting: Family

## 2017-10-31 NOTE — Telephone Encounter (Signed)
Letter for jury duty provided Please call pt

## 2017-10-31 NOTE — Telephone Encounter (Signed)
letter has been placed up front for pick up

## 2017-12-13 ENCOUNTER — Other Ambulatory Visit: Payer: Self-pay | Admitting: Family

## 2017-12-13 DIAGNOSIS — F419 Anxiety disorder, unspecified: Principal | ICD-10-CM

## 2017-12-13 DIAGNOSIS — F329 Major depressive disorder, single episode, unspecified: Secondary | ICD-10-CM

## 2017-12-13 DIAGNOSIS — F32A Depression, unspecified: Secondary | ICD-10-CM

## 2017-12-14 NOTE — Telephone Encounter (Signed)
Refilled: 10/18/2017 Last OV: 10/18/2017 Next OV: not scheduled 

## 2017-12-14 NOTE — Telephone Encounter (Signed)
faxed

## 2018-02-18 ENCOUNTER — Ambulatory Visit (INDEPENDENT_AMBULATORY_CARE_PROVIDER_SITE_OTHER): Payer: Medicare Other | Admitting: Family Medicine

## 2018-02-18 ENCOUNTER — Encounter: Payer: Self-pay | Admitting: Family Medicine

## 2018-02-18 VITALS — BP 128/88 | HR 64 | Temp 98.1°F | Wt 134.5 lb

## 2018-02-18 DIAGNOSIS — T161XXA Foreign body in right ear, initial encounter: Secondary | ICD-10-CM

## 2018-02-18 NOTE — Progress Notes (Signed)
Subjective:    Patient ID: Deborah Koch, female    DOB: 14-Apr-1956, 62 y.o.   MRN: [id number]  HPI This is a 62 yo female who presents today with possible foreign body in right ear. She inserted a cotton swab 4 days ago and thinks part of the cotton got stuck in her ear. She used ear wax removal kit and used solution and then irrigated with warm water. No pain or fever. Does not feel like anything is in her canal.   Past Medical History:  Diagnosis Date  . Anxiety   . C. difficile colitis    a. 07/2014.  [name] Congenital bicuspid aortic valve   . Depression   . Dilated Ascending Aorta    a. 12/2013 Echo: 4.45cm.  . Diverticulitis    hospitalized; subsequent c diff infection.   [name] GERD (gastroesophageal reflux disease)   . H/O mastitis 2015  . Heart murmur   . History of tobacco abuse    a. 20 yrs, 1/4 ppd - quit.  [name] HTN (hypertension)   . Hypokalemia   . Moderate to severe aortic stenosis    a. 12/2013 Echo: EF 60-65%, Grade 1 DD, bicuspid AoV with mod-sev AS [82mHg mean gradient, AoV area 0.69cm^2 (VTI)], mod dil ascending Ao - 4.45cm, mild TR, PASP 338mg.  . Multilevel degenerative disc disease   . Scoliosis    Past Surgical History:  Procedure Laterality Date  . BARTHOLIN GLAND CYST EXCISION    . BREAST BIOPSY Left    neg  . COLONOSCOPY  2013   Dr. WoAllen Norris. COLONOSCOPY WITH PROPOFOL N/A 10/23/2017   Procedure: COLONOSCOPY WITH PROPOFOL;  Surgeon: WoLucilla LameMD;  [facility];  Service: Endoscopy;  Laterality: N/A;  . OOPHORECTOMY  2016  . OVARY SURGERY  2016   cyst on right ovary   Family History  Problem Relation Age of Onset  . Hypertension Mother   . Colon cancer Mother   . Anxiety disorder Mother   . Depression Mother   . Heart failure Father   . Hypertension Father   . Depression Sister   . Anxiety disorder Sister   . Alcohol abuse Brother   . Schizophrenia Brother   . Breast cancer Maternal Aunt   . Heart attack Neg Hx   . Stroke Neg  Hx    Social History   Tobacco Use  . Smoking status: Current Every Day Smoker    Packs/day: 0.25    Years: 20.00    Pack years: 5.00    Types: Cigarettes    Last attempt to quit: 06/07/2005    Years since quitting: 12.7  . Smokeless tobacco: Never Used  Substance Use Topics  . Alcohol use: Yes    Alcohol/week: 7.2 oz    Types: 2 Cans of beer, 10 Glasses of wine per week    Comment:    . Drug use: No      Review of Systems Per HPI    Objective:   Physical Exam  Constitutional: She is oriented to person, place, and time. She appears well-developed and well-nourished. No distress.  HENT:  Head: Normocephalic and atraumatic.  Right Ear: Tympanic membrane, external ear and ear canal normal. No foreign bodies.  Right ear- able to clearly visualize TM. No cerumen. No foreign body in canal.   Eyes: Conjunctivae are normal.  Cardiovascular: Normal rate.  Pulmonary/Chest: Effort normal.  Neurological: She is alert and oriented to person, place, and time.  Skin:  Skin is warm and dry. She is not diaphoretic.  Psychiatric: She has a normal mood and affect. Her behavior is normal. Judgment and thought content normal.  Vitals reviewed.        BP 128/88   Pulse 64   Temp 98.1 F (36.7 C) (Oral)   Wt 134 lb 8 oz (61 kg)   SpO2 98%   BMI 21.07 kg/m  Wt Readings from Last 3 Encounters:  02/18/18 134 lb 8 oz (61 kg)  10/18/17 133 lb (60.3 kg)  08/19/17 135 lb (61.2 kg)    Assessment & Plan:  1. Foreign body of right ear, initial encounter - no visualized foreign body today, it is possible that when she irrigated her ear x 2 she flushed it out - RTC precautions reviewed - advised against inserting objects into ears   Clarene Reamer, FNP-BC  [facility] [place]  02/18/2018 10:01 AM

## 2018-03-07 ENCOUNTER — Other Ambulatory Visit: Payer: Self-pay | Admitting: Internal Medicine

## 2018-03-07 DIAGNOSIS — F329 Major depressive disorder, single episode, unspecified: Secondary | ICD-10-CM

## 2018-03-07 DIAGNOSIS — F32A Depression, unspecified: Secondary | ICD-10-CM

## 2018-03-07 DIAGNOSIS — F419 Anxiety disorder, unspecified: Principal | ICD-10-CM

## 2018-03-08 NOTE — Telephone Encounter (Signed)
Refilled: 12/14/2017 Last OV: 10/18/2017 Next OV: not scheduled

## 2018-03-08 NOTE — Telephone Encounter (Signed)
Refilled Remind pt she needs f/u every 3-4 mos as on controlled substance  I looked up patient on [place] Controlled Substances Reporting System and saw no activity that raised concern of inappropriate use.

## 2018-04-02 NOTE — Telephone Encounter (Signed)
Patient will call to schedule office visit , made aware of below

## 2018-05-01 ENCOUNTER — Ambulatory Visit (INDEPENDENT_AMBULATORY_CARE_PROVIDER_SITE_OTHER): Payer: Medicare Other | Admitting: Family

## 2018-05-01 ENCOUNTER — Encounter: Payer: Self-pay | Admitting: Family

## 2018-05-01 DIAGNOSIS — I10 Essential (primary) hypertension: Secondary | ICD-10-CM

## 2018-05-01 DIAGNOSIS — I350 Nonrheumatic aortic (valve) stenosis: Secondary | ICD-10-CM | POA: Diagnosis not present

## 2018-05-01 DIAGNOSIS — F32A Depression, unspecified: Secondary | ICD-10-CM

## 2018-05-01 DIAGNOSIS — F419 Anxiety disorder, unspecified: Secondary | ICD-10-CM | POA: Diagnosis not present

## 2018-05-01 DIAGNOSIS — F329 Major depressive disorder, single episode, unspecified: Secondary | ICD-10-CM | POA: Diagnosis not present

## 2018-05-01 MED ORDER — ESCITALOPRAM OXALATE 10 MG PO TABS: 10.0000 mg | ORAL_TABLET | Freq: Every day | ORAL | 1 refills | Status: DC

## 2018-05-01 MED ORDER — CLONAZEPAM 0.5 MG PO TABS: 0.5000 mg | ORAL_TABLET | Freq: Two times a day (BID) | ORAL | 1 refills | Status: DC | PRN

## 2018-05-01 NOTE — Patient Instructions (Addendum)
Consider cholesterol check at follow up.  Monitor blood pressure,  Goal is less than 130/80; if persistently higher, please make sooner follow up appointment so we can recheck you blood pressure and manage medications   Pleasure seeing you!

## 2018-05-01 NOTE — Assessment & Plan Note (Signed)
Elevated today. Typically well controlled. Patient will keep BP log at home and we will adjust regimen from there .

## 2018-05-01 NOTE — Assessment & Plan Note (Addendum)
Doing well. Trying to use less of klonopin. I looked up patient on [place] Controlled Substances Reporting System and saw no activity that raised concern of inappropriate use.   Continue current regimen

## 2018-05-01 NOTE — Assessment & Plan Note (Addendum)
Continues to be asymptomatic. Audible ( known) murmur on exam. Reviewed last note from [facility] and she is due to repeat echo this year. Patient will continue follow up with Gollan and I have emphasized the importance of this to patient.

## 2018-05-01 NOTE — Progress Notes (Signed)
Subjective:    Patient ID: Deborah Koch, female    DOB: [birth year], 62 y.o.   MRN: [id number]  CC: Deborah Koch is a 62 y.o. female who presents today for follow up.   HPI: Anxiety- Improved. Feels better today. Sleeping better. still trying to cut back on klonopin Lexapro working well, thinks a good dose for her.   HTN- she suspects elevated today as had had an argument with mother this morning. Denies exertional chest pain or pressure, numbness or tingling radiating to left arm or jaw, palpitations, dizziness, frequent headaches, changes in vision, or shortness of breath.   Follows with Gollan for bicuspic valve. No SOB.   Walks for exercise.        HISTORY:  Past Medical History:  Diagnosis Date  . Anxiety   . C. difficile colitis    a. 07/2014.  [name] Congenital bicuspid aortic valve   . Depression   . Dilated Ascending Aorta    a. 12/2013 Echo: 4.45cm.  . Diverticulitis    hospitalized; subsequent c diff infection.   [name] GERD (gastroesophageal reflux disease)   . H/O mastitis 2015  . Heart murmur   . History of tobacco abuse    a. 20 yrs, 1/4 ppd - quit.  [name] HTN (hypertension)   . Hypokalemia   . Moderate to severe aortic stenosis    a. 12/2013 Echo: EF 60-65%, Grade 1 DD, bicuspid AoV with mod-sev AS [48mmHg mean gradient, AoV area 0.69cm^2 (VTI)], mod dil ascending Ao - 4.45cm, mild TR, PASP 8mmHg.  . Multilevel degenerative disc disease   . Scoliosis    Past Surgical History:  Procedure Laterality Date  . BARTHOLIN GLAND CYST EXCISION    . BREAST BIOPSY Left    neg  . COLONOSCOPY  2013   Dr. Allen Norris  . COLONOSCOPY WITH PROPOFOL N/A 10/23/2017   Procedure: COLONOSCOPY WITH PROPOFOL;  Surgeon: Lucilla Lame, MD;  Location: [facility];  Service: Endoscopy;  Laterality: N/A;  . OOPHORECTOMY  2016  . OVARY SURGERY  2016   cyst on right ovary   Family History  Problem Relation Age of Onset  . Hypertension Mother   . Colon cancer Mother   .  Anxiety disorder Mother   . Depression Mother   . Heart failure Father   . Hypertension Father   . Depression Sister   . Anxiety disorder Sister   . Alcohol abuse Brother   . Schizophrenia Brother   . Breast cancer Maternal Aunt   . Heart attack Neg Hx   . Stroke Neg Hx     Allergies: Vicodin [hydrocodone-acetaminophen] Current Outpatient Medications on File Prior to Visit  Medication Sig Dispense Refill  . lisinopril (PRINIVIL,ZESTRIL) 20 MG tablet take 1 tablet by mouth once daily 90 tablet 3  . metoprolol succinate (TOPROL-XL) 100 MG 24 hr tablet Take 1 tablet (100 mg total) by mouth daily. Take with or immediately following a meal. 90 tablet 3  . ranitidine (ZANTAC) 150 MG tablet Take 1 tablet (150 mg total) by mouth every morning. 90 tablet 2   No current facility-administered medications on file prior to visit.     Social History   Tobacco Use  . Smoking status: Current Every Day Smoker    Packs/day: 0.25    Years: 20.00    Pack years: 5.00    Types: Cigarettes    Last attempt to quit: 06/07/2005    Years since quitting: 12.9  . Smokeless tobacco: Never Used  Substance Use Topics  . Alcohol use: Yes    Alcohol/week: 7.2 oz    Types: 2 Cans of beer, 10 Glasses of wine per week    Comment:    . Drug use: No    Review of Systems  Constitutional: Negative for chills and fever.  Respiratory: Negative for cough.   Cardiovascular: Negative for chest pain and palpitations.  Gastrointestinal: Negative for nausea and vomiting.  Psychiatric/Behavioral: Negative for sleep disturbance. The patient is not nervous/anxious.       Objective:    BP (!) 145/80   Pulse 67   Temp 98.1 F (36.7 C) (Oral)   Resp 16   Wt 135 lb 8 oz (61.5 kg)   SpO2 99%   BMI 21.22 kg/m  BP Readings from Last 3 Encounters:  05/01/18 (!) 145/80  02/18/18 128/88  10/23/17 110/75   Wt Readings from Last 3 Encounters:  05/01/18 135 lb 8 oz (61.5 kg)  02/18/18 134 lb 8 oz (61 kg)    10/18/17 133 lb (60.3 kg)    Physical Exam  Constitutional: She appears well-developed and well-nourished.  Eyes: Conjunctivae are normal.  Cardiovascular: Normal rate, regular rhythm and normal pulses.  Murmur heard.  Systolic murmur is present with a grade of 2/6. SEM II/VI, Loudest LSB, non radiating, no thrill   Pulmonary/Chest: Effort normal and breath sounds normal. She has no wheezes. She has no rhonchi. She has no rales.  Neurological: She is alert.  Skin: Skin is warm and dry.  Psychiatric: She has a normal mood and affect. Her speech is normal and behavior is normal. Thought content normal.  Vitals reviewed.      Assessment & Plan:   Problem List Items Addressed This Visit      Cardiovascular and Mediastinum   Moderate to severe aortic stenosis    Continues to be asymptomatic. Audible ( known) murmur on exam. Reviewed last note from [facility] and she is due to repeat echo this year. Patient will continue follow up with Gollan and I have emphasized the importance of this to patient.       HTN (hypertension)    Elevated today. Typically well controlled. Patient will keep BP log at home and we will adjust regimen from there .        Other   Anxiety and depression    Doing well. Trying to use less of klonopin. I looked up patient on [place] Controlled Substances Reporting System and saw no activity that raised concern of inappropriate use.   Continue current regimen       Relevant Medications   escitalopram (LEXAPRO) 10 MG tablet   clonazePAM (KLONOPIN) 0.5 MG tablet       I am having [name]. Marzetta Board "[name]" maintain her ranitidine, lisinopril, metoprolol succinate, escitalopram, and clonazePAM.   Meds ordered this encounter  Medications  . escitalopram (LEXAPRO) 10 MG tablet    Sig: Take 1 tablet (10 mg total) by mouth daily.    Dispense:  90 tablet    Refill:  1  . clonazePAM (KLONOPIN) 0.5 MG tablet    Sig: Take 1 tablet (0.5 mg total) by mouth 2 (two)  times daily as needed.    Dispense:  60 tablet    Refill:  1    Return precautions given.   Risks, benefits, and alternatives of the medications and treatment plan prescribed today were discussed, and patient expressed understanding.   Education regarding symptom management and diagnosis given to patient on  AVS.  Continue to follow with Burnard Hawthorne, FNP for routine health maintenance.   [name] and I agreed with plan.   Mable Paris, FNP

## 2018-05-12 ENCOUNTER — Encounter: Payer: Self-pay | Admitting: Family

## 2018-05-12 ENCOUNTER — Other Ambulatory Visit: Payer: Self-pay | Admitting: Family

## 2018-05-12 DIAGNOSIS — F32A Depression, unspecified: Secondary | ICD-10-CM

## 2018-05-12 DIAGNOSIS — F419 Anxiety disorder, unspecified: Principal | ICD-10-CM

## 2018-05-12 DIAGNOSIS — F329 Major depressive disorder, single episode, unspecified: Secondary | ICD-10-CM

## 2018-05-14 NOTE — Telephone Encounter (Signed)
Last office visit 05/01/18 No office visit scheduled Last filled 04/15/18 Per pharmacy didn't rec'v script on 05/01/18

## 2018-05-14 NOTE — Telephone Encounter (Signed)
Last filled 04/12/18 Last office visit 05/01/18 No office visit scheduled

## 2018-05-15 NOTE — Telephone Encounter (Signed)
I looked up patient on [place] Controlled Substances Reporting System and saw no activity that raised concern of inappropriate use.   

## 2018-05-31 ENCOUNTER — Telehealth: Payer: Self-pay | Admitting: Cardiovascular Disease

## 2018-05-31 DIAGNOSIS — I77810 Thoracic aortic ectasia: Secondary | ICD-10-CM

## 2018-05-31 DIAGNOSIS — I350 Nonrheumatic aortic (valve) stenosis: Secondary | ICD-10-CM

## 2018-05-31 NOTE — Telephone Encounter (Signed)
Patient states she thinks she was to have an ECHO before her annual follow up There is no order placed Please advise

## 2018-06-03 NOTE — Telephone Encounter (Signed)
Patient last saw Dr Rockey Situ 04/2017. On AVS: "Consider repeat echo for aortic valve stenosis, ascending aorta dilation in one year"  Order for echo entered.  Left detail message that order for echo entered and to call back to schedule, ok per DPR. Message routed to scheduling to reach out to patient as well.

## 2018-06-06 NOTE — Telephone Encounter (Signed)
Working on this through work [employer]

## 2018-06-13 ENCOUNTER — Other Ambulatory Visit: Payer: Medicare Other

## 2018-07-02 ENCOUNTER — Other Ambulatory Visit: Payer: Self-pay | Admitting: Family

## 2018-07-02 DIAGNOSIS — F32A Depression, unspecified: Secondary | ICD-10-CM

## 2018-07-02 DIAGNOSIS — F419 Anxiety disorder, unspecified: Principal | ICD-10-CM

## 2018-07-02 DIAGNOSIS — F329 Major depressive disorder, single episode, unspecified: Secondary | ICD-10-CM

## 2018-07-02 NOTE — Telephone Encounter (Signed)
Last filled 06/12/18 Last office visit 05/01/18 Spoke with patient she is ok to wait until Joycelyn Schmid get back in office next week

## 2018-07-05 ENCOUNTER — Ambulatory Visit (INDEPENDENT_AMBULATORY_CARE_PROVIDER_SITE_OTHER): Payer: Medicare Other

## 2018-07-05 ENCOUNTER — Other Ambulatory Visit: Payer: Self-pay

## 2018-07-05 DIAGNOSIS — I350 Nonrheumatic aortic (valve) stenosis: Secondary | ICD-10-CM

## 2018-07-05 DIAGNOSIS — I77810 Thoracic aortic ectasia: Secondary | ICD-10-CM | POA: Diagnosis not present

## 2018-07-06 NOTE — Progress Notes (Signed)
Cardiology Office Note  Date:  07/08/2018   ID:  Deborah, Koch Jun 17, 1956, MRN [id number]  PCP:  Burnard Hawthorne, FNP   Chief Complaint  Patient presents with  . OTHER    OD 12 month f/u no complaints today. Meds reviewed verbally with pt.    HPI:  [name] is a very pleasant 62 year old woman with history of  bicuspid aortic valve, moderate aortic valve stenosis , mild -moderate TR 4.1 cm ascending aorta dilation, on echo 2015 not visualized well on echocardiogram since then hypertension,  GERD,  out on disability for scoliosis among other issues prior smoking history Anxiety Takes care of 51 year old mother who has Alzheimer's who presents for routine follow-up of her aortic valve disease.  We reviewed most recent echocardiogram with her in detail No dramatic change in her aortic valve stenosis numbers year-to-year Echo 07/05/2018 Left ventricle: The cavity size was normal. Wall thickness was normal. Systolic function was normal. The estimated ejection fraction was in the range of 55% to 60%. Wall motion was normal  there were no regional wall motion abnormalities. Doppler  parameters are consistent with abnormal left ventricular  relaxation (grade 1 diastolic dysfunction). - Aortic valve: Bicuspid; moderately thickened, moderately calcified leaflets. There was moderate stenosis. Mean gradient (S): 29 mm Hg. - Aorta: Ascending aortic diameter: 44 mm (S). - Mitral valve: There was mild regurgitation. - Tricuspid valve: There was mild-moderate regurgitation. - Pulmonary arteries: Systolic pressure was within the normal range.  Did not qualify for chest CT, ad not smoked enough  Previous echo 12/2013: Mean gradient was 23 mm Hg Reviewed with her  No sx, active Takes care of 84 yo mother Housework Denies any significant shortness of breath or chest pain on heavy exertion Reports blood pressure well controlled higher today likely from anxiety  EKG personally  reviewed by myself on todays visit Shows normal sinus rhythm with rate 53 bpm no significant ST or T-wave changes  Other past medical history reviewed echocardiogram results discussed with her January 2015 Echo: Mean gradient 27, peak gradient 44,  Echocardiogram November 2016,  Mean gradient 31, peak gradient 50, peak velocity 354 cm/s  Echocardiogram May 2018 Mean gradient 28, peak radiant 55, peak velocity 370  Echocardiogram January 2015 and repeat echocardiogram September 2015 essentially unchanged She has 4.1 cm ascending aorta dilation, with moderate aortic valve stenosis, peak velocity 283 cm/s, mean gradient 27 mmHg, peak gradient 44 mmHg .   In August 2015, she reports having diverticulitis, C. difficile requiring long course of antibiotics in recovery   She reports that she used to smoke but stopped 10 years ago. Smoked since he was younger. She reports her cholesterol is very good though she does not know any specifics. No diabetes. Occasional anxiety and takes Klonopin when necessary  PMH:   has a past medical history of Anxiety, C. difficile colitis, Congenital bicuspid aortic valve, Depression, Dilated Ascending Aorta, Diverticulitis, GERD (gastroesophageal reflux disease), H/O mastitis (2015), Heart murmur, History of tobacco abuse, HTN (hypertension), Hypokalemia, Moderate to severe aortic stenosis, Multilevel degenerative disc disease, and Scoliosis.  PSH:    Past Surgical History:  Procedure Laterality Date  . BARTHOLIN GLAND CYST EXCISION    . BREAST BIOPSY Left    neg  . COLONOSCOPY  2013   Dr. Allen Norris  . COLONOSCOPY WITH PROPOFOL N/A 10/23/2017   Procedure: COLONOSCOPY WITH PROPOFOL;  Surgeon: Lucilla Lame, MD;  Location: [facility] ENDOSCOPY;  Service: Endoscopy;  Laterality: N/A;  . OOPHORECTOMY  2016  . OVARY SURGERY  2016   cyst on right ovary    Current Outpatient Medications  Medication Sig Dispense Refill  . escitalopram (LEXAPRO) 10 MG tablet Take 1  tablet (10 mg total) by mouth daily. 90 tablet 1  . lisinopril (PRINIVIL,ZESTRIL) 20 MG tablet take 1 tablet by mouth once daily 90 tablet 3  . metoprolol succinate (TOPROL-XL) 100 MG 24 hr tablet Take 1 tablet (100 mg total) by mouth daily. Take with or immediately following a meal. 90 tablet 3  . ranitidine (ZANTAC) 150 MG tablet Take 1 tablet (150 mg total) by mouth every morning. 90 tablet 2  . clonazePAM (KLONOPIN) 0.5 MG tablet TAKE 1 TABLET BY MOUTH TWICE A DAY AS NEEDED 60 tablet 1   No current facility-administered medications for this visit.      Allergies:   Vicodin [hydrocodone-acetaminophen]   Social History:  The patient  reports that she has been smoking cigarettes.  She has a 5.00 pack-year smoking history. She has never used smokeless tobacco. She reports that she drinks about 7.2 oz of alcohol per week. She reports that she does not use drugs.   Family History:   family history includes Alcohol abuse in her brother; Anxiety disorder in her mother and sister; Breast cancer in her maternal aunt; Colon cancer in her mother; Depression in her mother and sister; Heart failure in her father; Hypertension in her father and mother; Schizophrenia in her brother.    Review of Systems: Review of Systems  Constitutional: Negative.   Respiratory: Negative.   Cardiovascular: Negative.   Gastrointestinal: Negative.   Musculoskeletal: Negative.   Neurological: Negative.   Psychiatric/Behavioral: Negative.   All other systems reviewed and are negative.    PHYSICAL EXAM: VS:  BP 140/90 (BP Location: Left Arm, Patient Position: Sitting, Cuff Size: Normal)   Pulse (!) 53   Ht 5\' 6"  (1.676 m)   Wt 133 lb 8 oz (60.6 kg)   BMI 21.55 kg/m  , BMI Body mass index is 21.55 kg/m. Constitutional:  oriented to person, place, and time. No distress.  HENT:  Head: Normocephalic and atraumatic.  Eyes:  no discharge. No scleral icterus.  Neck: Normal range of motion. Neck supple. No JVD  present.  Cardiovascular: Normal rate, regular rhythm, normal heart sounds and intact distal pulses. Exam reveals no gallop and no friction rub. No edema 2/6 systolic ejection murmur heard right sternal border Pulmonary/Chest: Effort normal and breath sounds normal. No stridor. No respiratory distress.  no wheezes.  no rales.  no tenderness.  Abdominal: Soft.  no distension.  no tenderness.  Musculoskeletal: Normal range of motion.  no  tenderness or deformity.  Neurological:  normal muscle tone. Coordination normal. No atrophy Skin: Skin is warm and dry. No rash noted. not diaphoretic.  Psychiatric:  normal mood and affect. behavior is normal. Thought content normal.   Recent Labs: 08/19/2017: BUN 8; Creatinine, Ser 0.63; Hemoglobin 13.4; Platelets 380; Potassium 3.5; Sodium 133    Lipid Panel Lab Results  Component Value Date   CHOL 194 10/09/2016   HDL 89.00 10/09/2016   LDLCALC 90 10/09/2016   TRIG 72.0 10/09/2016      Wt Readings from Last 3 Encounters:  07/08/18 133 lb 8 oz (60.6 kg)  05/01/18 135 lb 8 oz (61.5 kg)  02/18/18 134 lb 8 oz (61 kg)       ASSESSMENT AND PLAN:  Moderate aortic stenosis - Plan: EKG 12-Lead Bicuspid valve, mild progression in  her measurements over the past 4 years Past several echocardiograms reviewed with her in detail Mean gradient 23 up to 29 mmHg since 2015 She is asymptomatic Discussed various types of surgical repair when she has symptoms Repeat echocardiogram 1 year  Essential hypertension - Plan: EKG 12-Lead Blood pressure is well controlled on today's visit. No changes made to the medications.  Ascending aorta dilation (HCC) - Plan: EKG 12-Lead We did discuss CT scan on her last 2 office visits Reports they are expensive for her No significant change in aorta 4.4 cm over the past several years since 2015, still same size  Congenital bicuspid aortic valve - Plan: EKG 12-Lead Confirmed on echocardiogram We'll continue to monitor  with yearly echo  Disposition:   F/U  12 months   Total encounter time more than 25 minutes  Greater than 50% was spent in counseling and coordination of care with the patient    Orders Placed This Encounter  Procedures  . EKG 12-Lead  . ECHOCARDIOGRAM COMPLETE     Signed, Esmond Plants, M.D., Ph.D. 07/08/2018  [place], [place]

## 2018-07-08 ENCOUNTER — Ambulatory Visit: Payer: Medicare Other | Admitting: Cardiovascular Disease

## 2018-07-08 ENCOUNTER — Encounter: Payer: Self-pay | Admitting: Cardiovascular Disease

## 2018-07-08 ENCOUNTER — Encounter: Payer: Self-pay | Admitting: Family

## 2018-07-08 VITALS — BP 140/90 | HR 53 | Ht 66.0 in | Wt 133.5 lb

## 2018-07-08 DIAGNOSIS — Q231 Congenital insufficiency of aortic valve: Secondary | ICD-10-CM | POA: Diagnosis not present

## 2018-07-08 DIAGNOSIS — I350 Nonrheumatic aortic (valve) stenosis: Secondary | ICD-10-CM | POA: Diagnosis not present

## 2018-07-08 DIAGNOSIS — I10 Essential (primary) hypertension: Secondary | ICD-10-CM

## 2018-07-08 DIAGNOSIS — I77810 Thoracic aortic ectasia: Secondary | ICD-10-CM | POA: Diagnosis not present

## 2018-07-08 NOTE — Telephone Encounter (Signed)
Refilled Sent mychart I looked up patient on [place] Controlled Substances Reporting System and saw no activity that raised concern of inappropriate use.

## 2018-07-08 NOTE — Patient Instructions (Addendum)
Medication Instructions:   No medication changes made  Labwork:  No new labs needed  Testing/Procedures:  Echo in one year for AVR  Follow-Up: It was a pleasure seeing you in the office today. Please call us if you have new issues that need to be addressed before your next appt.  [phone]  Your physician wants you to follow-up in: 12 months after echo.  You will receive a reminder letter in the mail two months in advance. If you don't receive a letter, please call our office to schedule the follow-up appointment.  If you need a refill on your cardiac medications before your next appointment, please call your pharmacy.  For educational health videos Log in to : [email or web address] Or : [email or web address], password : [email or web address]

## 2018-09-02 ENCOUNTER — Other Ambulatory Visit: Payer: Self-pay | Admitting: Family

## 2018-09-02 DIAGNOSIS — F419 Anxiety disorder, unspecified: Principal | ICD-10-CM

## 2018-09-02 DIAGNOSIS — F329 Major depressive disorder, single episode, unspecified: Secondary | ICD-10-CM

## 2018-09-02 DIAGNOSIS — F32A Depression, unspecified: Secondary | ICD-10-CM

## 2018-09-02 NOTE — Telephone Encounter (Signed)
Last filled 08/07/18 Last office visit 05/01/18 No office visit scheduled

## 2018-09-04 NOTE — Telephone Encounter (Signed)
I looked up patient on [place] Controlled Substances Reporting System and saw no activity that raised concern of inappropriate use.   

## 2018-09-25 ENCOUNTER — Other Ambulatory Visit: Payer: Self-pay | Admitting: Family

## 2018-09-25 DIAGNOSIS — E871 Hypo-osmolality and hyponatremia: Secondary | ICD-10-CM

## 2018-10-29 ENCOUNTER — Other Ambulatory Visit: Payer: Self-pay | Admitting: Family

## 2018-10-29 DIAGNOSIS — I10 Essential (primary) hypertension: Secondary | ICD-10-CM

## 2018-11-05 ENCOUNTER — Telehealth: Payer: Self-pay | Admitting: Family

## 2018-11-05 DIAGNOSIS — F329 Major depressive disorder, single episode, unspecified: Secondary | ICD-10-CM

## 2018-11-05 DIAGNOSIS — F32A Depression, unspecified: Secondary | ICD-10-CM

## 2018-11-05 DIAGNOSIS — F419 Anxiety disorder, unspecified: Principal | ICD-10-CM

## 2018-11-06 NOTE — Telephone Encounter (Signed)
Please education patient. This is controlled substance;  In order for me to prescribe medication,  Patient must be seen every 3-6 months. please make follow-up appointment.  I refilled the klonopin and sent via FAX  I looked up patient on [place] Controlled Substances Reporting System and saw no activity that raised concern of inappropriate use.

## 2018-11-08 NOTE — Telephone Encounter (Signed)
Pt aware of message below. States she will call back to schedule an appointment.

## 2018-11-08 NOTE — Telephone Encounter (Signed)
Left voice mail for patient to call back ok for [facility] to speak to patient , please schedule follow up appointment with Joycelyn Schmid, NP . See below message

## 2018-12-23 ENCOUNTER — Encounter: Payer: Self-pay | Admitting: Family

## 2018-12-25 ENCOUNTER — Other Ambulatory Visit: Payer: Self-pay | Admitting: Family

## 2018-12-25 DIAGNOSIS — Z1231 Encounter for screening mammogram for malignant neoplasm of breast: Secondary | ICD-10-CM

## 2018-12-26 ENCOUNTER — Other Ambulatory Visit: Payer: Self-pay | Admitting: Family

## 2018-12-26 DIAGNOSIS — E871 Hypo-osmolality and hyponatremia: Secondary | ICD-10-CM

## 2018-12-26 DIAGNOSIS — F419 Anxiety disorder, unspecified: Principal | ICD-10-CM

## 2018-12-26 DIAGNOSIS — F32A Depression, unspecified: Secondary | ICD-10-CM

## 2018-12-26 DIAGNOSIS — F329 Major depressive disorder, single episode, unspecified: Secondary | ICD-10-CM

## 2018-12-31 ENCOUNTER — Ambulatory Visit (INDEPENDENT_AMBULATORY_CARE_PROVIDER_SITE_OTHER): Payer: Medicare Other | Admitting: Internal Medicine

## 2018-12-31 ENCOUNTER — Encounter: Payer: Self-pay | Admitting: Internal Medicine

## 2018-12-31 VITALS — BP 132/78 | HR 71 | Temp 98.5°F | Wt 132.0 lb

## 2018-12-31 DIAGNOSIS — R6883 Chills (without fever): Secondary | ICD-10-CM | POA: Diagnosis not present

## 2018-12-31 DIAGNOSIS — R059 Cough, unspecified: Secondary | ICD-10-CM

## 2018-12-31 DIAGNOSIS — R51 Headache: Secondary | ICD-10-CM

## 2018-12-31 DIAGNOSIS — J101 Influenza due to other identified influenza virus with other respiratory manifestations: Secondary | ICD-10-CM

## 2018-12-31 DIAGNOSIS — R519 Headache, unspecified: Secondary | ICD-10-CM

## 2018-12-31 DIAGNOSIS — R52 Pain, unspecified: Secondary | ICD-10-CM

## 2018-12-31 DIAGNOSIS — R05 Cough: Secondary | ICD-10-CM | POA: Diagnosis not present

## 2018-12-31 LAB — POC INFLUENZA A&B (BINAX/QUICKVUE)
Influenza A, POC: POSITIVE — AB
Influenza B, POC: NEGATIVE

## 2018-12-31 MED ORDER — HYDROCOD POLST-CPM POLST ER 10-8 MG/5ML PO SUER: 5.0000 mL | Freq: Every evening | ORAL | 0 refills | Status: DC | PRN

## 2018-12-31 NOTE — Patient Instructions (Signed)

## 2018-12-31 NOTE — Progress Notes (Signed)
HPI  Pt presents to the clinic today with c/o headache, fatigue and cough.  She reports this started 3 days ago.  The headache is located in her forehead.  She describes the pain as pressure.  She denies associated dizziness or visual changes.  She denies runny nose, nasal congestion, ear pain or sore throat.  The cough is productive of yellow mucus.  She does have burning in her chest when she coughs but no shortness of breath.  She denies fever but has had chills and body aches.  She has not had sick contacts that she is aware of.  She did not get her flu shot.  Review of Systems      Past Medical History:  Diagnosis Date  . Anxiety   . C. difficile colitis    a. 07/2014.  [name] Congenital bicuspid aortic valve   . Depression   . Dilated Ascending Aorta    a. 12/2013 Echo: 4.45cm.  . Diverticulitis    hospitalized; subsequent c diff infection.   [name] GERD (gastroesophageal reflux disease)   . H/O mastitis 2015  . Heart murmur   . History of tobacco abuse    a. 20 yrs, 1/4 ppd - quit.  [name] HTN (hypertension)   . Hypokalemia   . Moderate to severe aortic stenosis    a. 12/2013 Echo: EF 60-65%, Grade 1 DD, bicuspid AoV with mod-sev AS [67mmHg mean gradient, AoV area 0.69cm^2 (VTI)], mod dil ascending Ao - 4.45cm, mild TR, PASP 24mmHg.  . Multilevel degenerative disc disease   . Scoliosis     Family History  Problem Relation Age of Onset  . Hypertension Mother   . Colon cancer Mother   . Anxiety disorder Mother   . Depression Mother   . Heart failure Father   . Hypertension Father   . Depression Sister   . Anxiety disorder Sister   . Alcohol abuse Brother   . Schizophrenia Brother   . Breast cancer Maternal Aunt   . Heart attack Neg Hx   . Stroke Neg Hx     Social History   Socioeconomic History  . Marital status: Married    Spouse name: Not on file  . Number of children: Not on file  . Years of education: Not on file  . Highest education level: Not on file  Occupational  History  . Not on file  Social Needs  . Financial resource strain: Not on file  . Food insecurity:    Worry: Not on file    Inability: Not on file  . Transportation needs:    Medical: Not on file    Non-medical: Not on file  Tobacco Use  . Smoking status: Current Every Day Smoker    Packs/day: 0.25    Years: 20.00    Pack years: 5.00    Types: Cigarettes    Last attempt to quit: 06/07/2005    Years since quitting: 13.5  . Smokeless tobacco: Never Used  Substance and Sexual Activity  . Alcohol use: Yes    Alcohol/week: 12.0 standard drinks    Types: 2 Cans of beer, 10 Glasses of wine per week    Comment:    . Drug use: No  . Sexual activity: Yes    Birth control/protection: None  Lifestyle  . Physical activity:    Days per week: Not on file    Minutes per session: Not on file  . Stress: Not on file  Relationships  . Social connections:  Talks on phone: Not on file    Gets together: Not on file    Attends religious service: Not on file    Active member of club or organization: Not on file    Attends meetings of clubs or organizations: Not on file    Relationship status: Not on file  . Intimate partner violence:    Fear of current or ex partner: Not on file    Emotionally abused: Not on file    Physically abused: Not on file    Forced sexual activity: Not on file  Other Topics Concern  . Not on file  Social History Narrative   From [place] and lives here      Sister is patient of mine      Divorced ( husband had drug addiction) and since remarried a man from her highschool days    2 children who live in [place]      Not working since she got C.diff infection 2015 while working at [employer].      Started yoga    Allergies  Allergen Reactions  . Vicodin [Hydrocodone-Acetaminophen] Hives and Rash     Constitutional: Positive headache, fatigue. Denies fever, abrupt weight changes.  HEENT:  Denies eye redness, eye pain, pressure behind the eyes,  facial pain, nasal congestion, ear pain, ringing in the ears, wax buildup, runny nose or sore throat. Respiratory: Positive cough. Denies difficulty breathing or shortness of breath.  Cardiovascular: Denies chest pain, chest tightness, palpitations or swelling in the hands or feet.   No other specific complaints in a complete review of systems (except as listed in HPI above).  Objective:   BP 132/78   Pulse 71   Temp 98.5 F (36.9 C) (Oral)   Wt 132 lb (59.9 kg)   SpO2 99%   BMI 21.31 kg/m  Wt Readings from Last 3 Encounters:  12/31/18 132 lb (59.9 kg)  07/08/18 133 lb 8 oz (60.6 kg)  05/01/18 135 lb 8 oz (61.5 kg)     General: Appears her stated age, ill-appearing, in NAD. HEENT: Head: normal shape and size, no sinus tenderness noted; Ears: Tm's gray and intact, normal light reflex; Nose: mucosa pink and moist, septum midline; Throat/Mouth: + PND. Teeth present, mucosa erythematous and moist, no exudate noted, no lesions or ulcerations noted.  Neck: No cervical lymphadenopathy.  Cardiovascular: Normal rate and rhythm. S1,S2 noted.  No murmur, rubs or gallops noted.  Pulmonary/Chest: Normal effort and positive vesicular breath sounds. No respiratory distress. No wheezes, rales or ronchi noted.       Assessment & Plan:   Headache, Cough, Chills and Body Aches secondary to Influenza A:  Rapid flu: positive for A Get some rest and drink plenty of water Too far out for antiviral therapy, will treat symptomatically Start Flonase OTC eRx for Hycodan cough syrup  RTC as needed or if symptoms persist.   Webb Silversmith, NP

## 2019-01-01 ENCOUNTER — Encounter: Payer: Self-pay | Admitting: Internal Medicine

## 2019-01-01 ENCOUNTER — Other Ambulatory Visit: Payer: Self-pay | Admitting: Family

## 2019-01-01 MED ORDER — HYDROCOD POLST-CPM POLST ER 10-8 MG/5ML PO SUER: 5.0000 mL | Freq: Every evening | ORAL | 0 refills | Status: DC | PRN

## 2019-01-01 NOTE — Addendum Note (Signed)
Addended by: Lindalou Hose Y on: 01/01/2019 01:00 PM   Modules accepted: Orders

## 2019-01-01 NOTE — Addendum Note (Signed)
Addended by: Lurlean Nanny on: 01/01/2019 12:01 PM   Modules accepted: Orders

## 2019-01-01 NOTE — Telephone Encounter (Signed)
Pt called office requesting the Tussionex be resent to [facility] on [address] because [facility] does not have the medication in stock.

## 2019-01-04 ENCOUNTER — Other Ambulatory Visit: Payer: Self-pay | Admitting: Family

## 2019-01-04 DIAGNOSIS — E871 Hypo-osmolality and hyponatremia: Secondary | ICD-10-CM

## 2019-01-04 DIAGNOSIS — F329 Major depressive disorder, single episode, unspecified: Secondary | ICD-10-CM

## 2019-01-04 DIAGNOSIS — F32A Depression, unspecified: Secondary | ICD-10-CM

## 2019-01-04 DIAGNOSIS — F419 Anxiety disorder, unspecified: Secondary | ICD-10-CM

## 2019-01-06 ENCOUNTER — Ambulatory Visit: Payer: Medicare Other | Admitting: Family

## 2019-01-06 NOTE — Telephone Encounter (Signed)
Copied from [place] [phone]. Topic: Quick Communication - Appointment Cancellation >> Jan 06, 2019  1:05 PM Ivar Drape wrote: Patient called to cancel appointment scheduled for 01/06/2019. Patient has rescheduled their appointment.  Patient was diagnosed with the flu and she can't make it to the appt today.  Route to department's PEC pool.

## 2019-01-08 ENCOUNTER — Other Ambulatory Visit: Payer: Self-pay

## 2019-01-08 ENCOUNTER — Ambulatory Visit (INDEPENDENT_AMBULATORY_CARE_PROVIDER_SITE_OTHER): Payer: Medicare Other

## 2019-01-08 ENCOUNTER — Encounter: Payer: Self-pay | Admitting: Family

## 2019-01-08 ENCOUNTER — Ambulatory Visit (INDEPENDENT_AMBULATORY_CARE_PROVIDER_SITE_OTHER): Payer: Medicare Other | Admitting: Family

## 2019-01-08 VITALS — BP 132/76 | HR 64 | Temp 98.0°F | Wt 136.0 lb

## 2019-01-08 DIAGNOSIS — J40 Bronchitis, not specified as acute or chronic: Secondary | ICD-10-CM | POA: Insufficient documentation

## 2019-01-08 DIAGNOSIS — F329 Major depressive disorder, single episode, unspecified: Secondary | ICD-10-CM | POA: Diagnosis not present

## 2019-01-08 DIAGNOSIS — F419 Anxiety disorder, unspecified: Secondary | ICD-10-CM | POA: Diagnosis not present

## 2019-01-08 DIAGNOSIS — E871 Hypo-osmolality and hyponatremia: Secondary | ICD-10-CM

## 2019-01-08 DIAGNOSIS — F32A Depression, unspecified: Secondary | ICD-10-CM

## 2019-01-08 DIAGNOSIS — I10 Essential (primary) hypertension: Secondary | ICD-10-CM

## 2019-01-08 MED ORDER — METOPROLOL SUCCINATE ER 100 MG PO TB24: 100.0000 mg | ORAL_TABLET | Freq: Every day | ORAL | 0 refills | Status: DC

## 2019-01-08 MED ORDER — CLONAZEPAM 0.5 MG PO TABS: 0.5000 mg | ORAL_TABLET | Freq: Two times a day (BID) | ORAL | 1 refills | Status: DC | PRN

## 2019-01-08 MED ORDER — ESCITALOPRAM OXALATE 10 MG PO TABS: 10.0000 mg | ORAL_TABLET | Freq: Every day | ORAL | 1 refills | Status: DC

## 2019-01-08 MED ORDER — LISINOPRIL 20 MG PO TABS: 20.0000 mg | ORAL_TABLET | Freq: Every day | ORAL | 0 refills | Status: DC

## 2019-01-08 MED ORDER — ALBUTEROL SULFATE HFA 108 (90 BASE) MCG/ACT IN AERS: 2.0000 | INHALATION_SPRAY | Freq: Four times a day (QID) | RESPIRATORY_TRACT | 1 refills | Status: DC | PRN

## 2019-01-08 NOTE — Patient Instructions (Signed)
Use albuterol every 6 hours for first 24 hours to get good medication into the lungs and loosen congestion; after, you may use as needed and eventually stop all together when cough resolves.  Mucinex PLAIN with plenty of water  Lets await chest xray prior to starting antibiotic ( if needed) OR prednisone

## 2019-01-08 NOTE — Progress Notes (Signed)
Subjective:    Patient ID: Deborah Koch, female    DOB: 1956-04-22, 63 y.o.   MRN: [id number]  CC: Deborah Koch is a 63 y.o. female who presents today for follow up.   HPI: Continues to have green productive cough, improved.  Using codeine syrup at night.  Cntinues to be fatigued however this is improved. Wheezing from time to time. Bodyaches have improved. No fever.   Current smoker, less than 4 /day.   GAD- doing well on lexapro. Using half tablet of klonopin at night.   Diagnosed with Flu 12/31/18.Out of window for tamiflu.  HISTORY:  Past Medical History:  Diagnosis Date  . Anxiety   . C. difficile colitis    a. 07/2014.  [name] Congenital bicuspid aortic valve   . Depression   . Dilated Ascending Aorta    a. 12/2013 Echo: 4.45cm.  . Diverticulitis    hospitalized; subsequent c diff infection.   [name] GERD (gastroesophageal reflux disease)   . H/O mastitis 2015  . Heart murmur   . History of tobacco abuse    a. 20 yrs, 1/4 ppd - quit.  [name] HTN (hypertension)   . Hypokalemia   . Moderate to severe aortic stenosis    a. 12/2013 Echo: EF 60-65%, Grade 1 DD, bicuspid AoV with mod-sev AS [15mmHg mean gradient, AoV area 0.69cm^2 (VTI)], mod dil ascending Ao - 4.45cm, mild TR, PASP 73mmHg.  . Multilevel degenerative disc disease   . Scoliosis    Past Surgical History:  Procedure Laterality Date  . BARTHOLIN GLAND CYST EXCISION    . BREAST BIOPSY Left    neg  . COLONOSCOPY  2013   Dr. Allen Norris  . COLONOSCOPY WITH PROPOFOL N/A 10/23/2017   Procedure: COLONOSCOPY WITH PROPOFOL;  Surgeon: Lucilla Lame, MD;  Location: [facility] ENDOSCOPY;  Service: Endoscopy;  Laterality: N/A;  . OOPHORECTOMY  2016  . OVARY SURGERY  2016   cyst on right ovary   Family History  Problem Relation Age of Onset  . Hypertension Mother   . Colon cancer Mother   . Anxiety disorder Mother   . Depression Mother   . Heart failure Father   . Hypertension Father   . Depression Sister   . Anxiety  disorder Sister   . Alcohol abuse Brother   . Schizophrenia Brother   . Breast cancer Maternal Aunt   . Heart attack Neg Hx   . Stroke Neg Hx     Allergies: Vicodin [hydrocodone-acetaminophen] Current Outpatient Medications on File Prior to Visit  Medication Sig Dispense Refill  . chlorpheniramine-HYDROcodone (TUSSIONEX PENNKINETIC ER) 10-8 MG/5ML SUER Take 5 mLs by mouth at bedtime as needed for cough. 140 mL 0   No current facility-administered medications on file prior to visit.     Social History   Tobacco Use  . Smoking status: Current Every Day Smoker    Packs/day: 0.25    Years: 20.00    Pack years: 5.00    Types: Cigarettes    Last attempt to quit: 06/07/2005    Years since quitting: 13.5  . Smokeless tobacco: Never Used  Substance Use Topics  . Alcohol use: Yes    Alcohol/week: 12.0 standard drinks    Types: 2 Cans of beer, 10 Glasses of wine per week    Comment:    . Drug use: No    Review of Systems  Constitutional: Negative for chills and fever.  HENT: Positive for congestion. Negative for ear pain and sinus  pressure.   Respiratory: Positive for cough and wheezing. Negative for shortness of breath.   Cardiovascular: Negative for chest pain and palpitations.  Gastrointestinal: Negative for nausea and vomiting.  Psychiatric/Behavioral: The patient is nervous/anxious (stable).       Objective:    BP 132/76 (BP Location: Left Arm, Cuff Size: Large)   Pulse 64   Temp 98 F (36.7 C)   Wt 136 lb (61.7 kg)   SpO2 98%   BMI 21.95 kg/m  BP Readings from Last 3 Encounters:  01/08/19 132/76  12/31/18 132/78  07/08/18 140/90   Wt Readings from Last 3 Encounters:  01/08/19 136 lb (61.7 kg)  12/31/18 132 lb (59.9 kg)  07/08/18 133 lb 8 oz (60.6 kg)    Physical Exam Vitals signs reviewed.  Constitutional:      Appearance: She is well-developed.  HENT:     Head: Normocephalic and atraumatic.     Right Ear: Hearing, tympanic membrane, ear canal and  external ear normal. No decreased hearing noted. No drainage, swelling or tenderness. No middle ear effusion. No foreign body. Tympanic membrane is not erythematous or bulging.     Left Ear: Hearing, tympanic membrane, ear canal and external ear normal. No decreased hearing noted. No drainage, swelling or tenderness.  No middle ear effusion. No foreign body. Tympanic membrane is not erythematous or bulging.     Nose: Nose normal. No rhinorrhea.     Right Sinus: No maxillary sinus tenderness or frontal sinus tenderness.     Left Sinus: No maxillary sinus tenderness or frontal sinus tenderness.     Mouth/Throat:     Pharynx: Uvula midline. No oropharyngeal exudate or posterior oropharyngeal erythema.     Tonsils: No tonsillar abscesses.  Eyes:     Conjunctiva/sclera: Conjunctivae normal.  Cardiovascular:     Rate and Rhythm: Regular rhythm.     Pulses: Normal pulses.     Heart sounds: Normal heart sounds.  Pulmonary:     Effort: Pulmonary effort is normal.     Breath sounds: Normal breath sounds. No wheezing, rhonchi or rales.  Lymphadenopathy:     Head:     Right side of head: No submental, submandibular, tonsillar, preauricular, posterior auricular or occipital adenopathy.     Left side of head: No submental, submandibular, tonsillar, preauricular, posterior auricular or occipital adenopathy.     Cervical: No cervical adenopathy.  Skin:    General: Skin is warm and dry.  Neurological:     Mental Status: She is alert.  Psychiatric:        Speech: Speech normal.        Behavior: Behavior normal.        Thought Content: Thought content normal.        Assessment & Plan:   Problem List Items Addressed This Visit      Respiratory   Bronchitis - Primary    Well appearing. Sa02 98%. Symptoms have slowly improved. No acute respiratory distress.Suspect residual inflammation post viral or COPD exacerbation. Pending CXR prior to further medications. If normal, likely will start prednisone  taper.       Relevant Medications   albuterol (PROVENTIL HFA;VENTOLIN HFA) 108 (90 Base) MCG/ACT inhaler   Other Relevant Orders   DG Chest 2 View     Other   Anxiety and depression    Declines increase of lexapro. Maintain current regimen. Agreed to use klonopin even less. Refilled.  I looked up patient on [place] Controlled Substances Reporting System and  saw no activity that raised concern of inappropriate use.        Relevant Medications   clonazePAM (KLONOPIN) 0.5 MG tablet       I have changed [name]. Meinecke "Kathy"'s clonazePAM. I am also having her start on albuterol. Additionally, I am having her maintain her chlorpheniramine-HYDROcodone.   Meds ordered this encounter  Medications  . albuterol (PROVENTIL HFA;VENTOLIN HFA) 108 (90 Base) MCG/ACT inhaler    Sig: Inhale 2 puffs into the lungs every 6 (six) hours as needed for wheezing or shortness of breath.    Dispense:  1 Inhaler    Refill:  1    Order Specific Question:   Supervising Provider    Answer:   Derrel Nip, TERESA L [[id number]]  . clonazePAM (KLONOPIN) 0.5 MG tablet    Sig: Take 1 tablet (0.5 mg total) by mouth 2 (two) times daily as needed.    Dispense:  45 tablet    Refill:  1    Order Specific Question:   Supervising Provider    Answer:   Crecencio Mc [[id number]]    Return precautions given.   Risks, benefits, and alternatives of the medications and treatment plan prescribed today were discussed, and patient expressed understanding.   Education regarding symptom management and diagnosis given to patient on AVS.  Continue to follow with Burnard Hawthorne, FNP for routine health maintenance.   [name] and I agreed with plan.   Mable Paris, FNP

## 2019-01-08 NOTE — Assessment & Plan Note (Addendum)
Well appearing. Sa02 98%. Symptoms have slowly improved. No acute respiratory distress.Suspect residual inflammation post viral or COPD exacerbation. Pending CXR prior to further medications. If normal, likely will start prednisone taper.

## 2019-01-08 NOTE — Assessment & Plan Note (Signed)
Declines increase of lexapro. Maintain current regimen. Agreed to use klonopin even less. Refilled.  I looked up patient on [place] Controlled Substances Reporting System and saw no activity that raised concern of inappropriate use.

## 2019-01-09 ENCOUNTER — Other Ambulatory Visit: Payer: Self-pay | Admitting: Family

## 2019-01-09 DIAGNOSIS — J189 Pneumonia, unspecified organism: Secondary | ICD-10-CM

## 2019-01-09 MED ORDER — AZITHROMYCIN 250 MG PO TABS: ORAL_TABLET | ORAL | 0 refills | Status: DC

## 2019-01-18 ENCOUNTER — Other Ambulatory Visit: Payer: Self-pay | Admitting: Family

## 2019-01-18 DIAGNOSIS — I10 Essential (primary) hypertension: Secondary | ICD-10-CM

## 2019-02-25 ENCOUNTER — Other Ambulatory Visit: Payer: Self-pay | Admitting: Family

## 2019-02-25 DIAGNOSIS — F32A Depression, unspecified: Secondary | ICD-10-CM

## 2019-02-25 DIAGNOSIS — F329 Major depressive disorder, single episode, unspecified: Secondary | ICD-10-CM

## 2019-02-25 DIAGNOSIS — F419 Anxiety disorder, unspecified: Principal | ICD-10-CM

## 2019-02-26 NOTE — Telephone Encounter (Signed)
I looked up patient on [place] Controlled Substances Reporting System and saw no activity that raised concern of inappropriate use.   

## 2019-04-03 ENCOUNTER — Other Ambulatory Visit: Payer: Self-pay | Admitting: Family

## 2019-04-03 DIAGNOSIS — I10 Essential (primary) hypertension: Secondary | ICD-10-CM

## 2019-04-09 ENCOUNTER — Other Ambulatory Visit: Payer: Self-pay | Admitting: Family

## 2019-04-09 DIAGNOSIS — F329 Major depressive disorder, single episode, unspecified: Secondary | ICD-10-CM

## 2019-04-09 DIAGNOSIS — F32A Depression, unspecified: Secondary | ICD-10-CM

## 2019-04-09 DIAGNOSIS — F419 Anxiety disorder, unspecified: Principal | ICD-10-CM

## 2019-04-09 NOTE — Telephone Encounter (Signed)
Last OV 01/08/2019  Last refilled 02/26/2019 disp 45 with 1 refills   Next OV none scheduled   Sent to PCP for approval

## 2019-04-11 NOTE — Telephone Encounter (Signed)
Call pt  Last seen in January, she needs a follow-up to be a controlled substance.  She also indicated on her medical request that she wanted 60 tablets versus 45. I sent in 60 tablets.   I would like her to schedule a doxy   I looked up patient on [place] Controlled Substances Reporting System and saw no activity that raised concern of inappropriate use.

## 2019-04-14 NOTE — Telephone Encounter (Signed)
LMTCB to schedule Doxy for Klonopin script.

## 2019-06-03 ENCOUNTER — Other Ambulatory Visit: Payer: Self-pay | Admitting: Family

## 2019-06-03 DIAGNOSIS — F32A Depression, unspecified: Secondary | ICD-10-CM

## 2019-06-03 DIAGNOSIS — E871 Hypo-osmolality and hyponatremia: Secondary | ICD-10-CM

## 2019-06-03 DIAGNOSIS — F329 Major depressive disorder, single episode, unspecified: Secondary | ICD-10-CM

## 2019-06-03 DIAGNOSIS — F419 Anxiety disorder, unspecified: Secondary | ICD-10-CM

## 2019-06-04 NOTE — Telephone Encounter (Signed)
Call patient She is due for follow-up since she is on a controlled substance I refilled klonopin without a refill Think printed to other side- can you check?

## 2019-06-06 ENCOUNTER — Other Ambulatory Visit: Payer: Self-pay | Admitting: Family

## 2019-06-06 ENCOUNTER — Telehealth: Payer: Self-pay

## 2019-06-06 DIAGNOSIS — F32A Depression, unspecified: Secondary | ICD-10-CM

## 2019-06-06 DIAGNOSIS — F419 Anxiety disorder, unspecified: Secondary | ICD-10-CM

## 2019-06-06 DIAGNOSIS — F329 Major depressive disorder, single episode, unspecified: Secondary | ICD-10-CM

## 2019-06-06 MED ORDER — CLONAZEPAM 0.5 MG PO TABS: 0.5000 mg | ORAL_TABLET | Freq: Two times a day (BID) | ORAL | 0 refills | Status: DC | PRN

## 2019-06-06 NOTE — Telephone Encounter (Signed)
Copied from [place] [phone]. Topic: General - Other >> Jun 06, 2019 12:13 PM Carolyn Stare wrote: Pt call about her RX for clonazePAM KLONOPIN 0.5 MG tablet    showing RX was printed but pharmacy does not have RX  [facility]

## 2019-06-06 NOTE — Telephone Encounter (Signed)
I resent electronically Please sch follow up

## 2019-06-09 NOTE — Telephone Encounter (Signed)
Patient scheduled for Doxy 06/25/19.

## 2019-06-25 ENCOUNTER — Other Ambulatory Visit: Payer: Self-pay

## 2019-06-25 ENCOUNTER — Ambulatory Visit (INDEPENDENT_AMBULATORY_CARE_PROVIDER_SITE_OTHER): Payer: Medicare Other | Admitting: Family

## 2019-06-25 ENCOUNTER — Encounter: Payer: Self-pay | Admitting: Family

## 2019-06-25 DIAGNOSIS — Z8701 Personal history of pneumonia (recurrent): Secondary | ICD-10-CM

## 2019-06-25 DIAGNOSIS — Z1239 Encounter for other screening for malignant neoplasm of breast: Secondary | ICD-10-CM

## 2019-06-25 DIAGNOSIS — I10 Essential (primary) hypertension: Secondary | ICD-10-CM

## 2019-06-25 DIAGNOSIS — F329 Major depressive disorder, single episode, unspecified: Secondary | ICD-10-CM

## 2019-06-25 DIAGNOSIS — F32A Depression, unspecified: Secondary | ICD-10-CM

## 2019-06-25 DIAGNOSIS — F419 Anxiety disorder, unspecified: Secondary | ICD-10-CM

## 2019-06-25 NOTE — Assessment & Plan Note (Signed)
No recent values. Advised patient to monitor at home.

## 2019-06-25 NOTE — Patient Instructions (Addendum)
REPEAT chest xray  Monitor blood pressure,  Goal is less than 120/80, based on newest guidelines; if persistently higher, please make sooner follow up appointment so we can recheck you blood pressure and manage medications  Today we discussed referrals, orders. Counseling   I have placed these orders in the system for you.  Please be sure to give [place] a call if you have not heard from our office regarding this. We should hear from [place] within ONE week with information regarding your appointment. If not, please let me know immediately.  We placed a referral for mammogram this year. I asked that you call one the below locations and schedule this when it is convenient for you.   As discussed, I would like you to ask for 3D mammogram over the traditional 2D mammogram as new evidence suggest 3D is superior.    [place]  [place], [place]    Let me know how you are doing  Stay safe!

## 2019-06-25 NOTE — Assessment & Plan Note (Addendum)
Depression screen [facility] 2/9 06/25/2019 01/08/2019 10/18/2017  Decreased Interest 1 1 0  Down, Depressed, Hopeless 1 1 0  PHQ - 2 Score 2 2 0  Altered sleeping 3 1 -  Tired, decreased energy 3 1 -  Change in appetite 2 0 -  Feeling bad or failure about yourself  0 0 -  Trouble concentrating 0 0 -  Moving slowly or fidgety/restless 1 0 -  Suicidal thoughts 0 0 -  PHQ-9 Score 11 4 -  Difficult doing work/chores Somewhat difficult Not difficult at all -   Worse of late, though improving. Will maintain regimen and refer her to counseling.

## 2019-06-25 NOTE — Progress Notes (Signed)
This visit type was conducted due to national recommendations for restrictions regarding the COVID-19 pandemic (e.g. social distancing).  This format is felt to be most appropriate for this patient at this time.  All issues noted in this document were discussed and addressed.  No physical exam was performed (except for noted visual exam findings with Video Visits). Virtual Visit via Video Note  I connected with@  on 06/25/19 at  1:30 PM EDT by a video enabled telemedicine application and verified that I am speaking with the correct person using two identifiers.  Location patient: home Location provider:work  Persons participating in the virtual visit: patient, provider  I discussed the limitations of evaluation and management by telemedicine and the availability of in person appointments. The patient expressed understanding and agreed to proceed.   HPI:  Anxiety and depression- worried about family, pandemic.starting accept the new normal.  Has days when very tearful. Kids doing well. Enjoying piano.  Going to babysit for the first time to see grandchildren. Feels like lexapro is helpful and doesn't want to increase. Klonopin daily, will use half tablet if doesn't feel needs the whole tablet.  Thinks counseling would be helpful.   Tdap , mammogram,  due  HTN- hasnt checked at home. Denies exertional chest pain or pressure, numbness or tingling radiating to left arm or jaw, palpitations, dizziness, frequent headaches, changes in vision, or shortness of breath.     ROS: See pertinent positives and negatives per HPI.  Past Medical History:  Diagnosis Date  . Anxiety   . C. difficile colitis    a. 07/2014.  [name] Congenital bicuspid aortic valve   . Depression   . Dilated Ascending Aorta    a. 12/2013 Echo: 4.45cm.  . Diverticulitis    hospitalized; subsequent c diff infection.   [name] GERD (gastroesophageal reflux disease)   . H/O mastitis 2015  . Heart murmur   . History of tobacco abuse     a. 20 yrs, 1/4 ppd - quit.  [name] HTN (hypertension)   . Hypokalemia   . Moderate to severe aortic stenosis    a. 12/2013 Echo: EF 60-65%, Grade 1 DD, bicuspid AoV with mod-sev AS [67mmHg mean gradient, AoV area 0.69cm^2 (VTI)], mod dil ascending Ao - 4.45cm, mild TR, PASP 58mmHg.  . Multilevel degenerative disc disease   . Scoliosis     Past Surgical History:  Procedure Laterality Date  . BARTHOLIN GLAND CYST EXCISION    . BREAST BIOPSY Left    neg  . COLONOSCOPY  2013   Dr. Allen Norris  . COLONOSCOPY WITH PROPOFOL N/A 10/23/2017   Procedure: COLONOSCOPY WITH PROPOFOL;  Surgeon: Lucilla Lame, MD;  [facility] ENDOSCOPY;  Service: Endoscopy;  Laterality: N/A;  . OOPHORECTOMY  2016  . OVARY SURGERY  2016   cyst on right ovary    Family History  Problem Relation Age of Onset  . Hypertension Mother   . Colon cancer Mother   . Anxiety disorder Mother   . Depression Mother   . Heart failure Father   . Hypertension Father   . Depression Sister   . Anxiety disorder Sister   . Alcohol abuse Brother   . Schizophrenia Brother   . Breast cancer Maternal Aunt   . Heart attack Neg Hx   . Stroke Neg Hx     SOCIAL HX: former smoker   Current Outpatient Medications:  .  clonazePAM (KLONOPIN) 0.5 MG tablet, Take 1 tablet (0.5 mg total) by mouth 2 (two)  times daily as needed., Disp: 60 tablet, Rfl: 0 .  escitalopram (LEXAPRO) 10 MG tablet, Take 1 tablet (10 mg total) by mouth daily., Disp: 90 tablet, Rfl: 1 .  lisinopril (ZESTRIL) 20 MG tablet, TAKE 1 TABLET BY MOUTH EVERY DAY, Disp: 90 tablet, Rfl: 0 .  metoprolol succinate (TOPROL-XL) 100 MG 24 hr tablet, TAKE 1 TABLET BY MOUTH DAILY. PLEASE CALL OFFICE FOR BP FOLLOW-UP APPT AS MENTIONED IN MAY., Disp: 90 tablet, Rfl: 0 .  albuterol (PROVENTIL HFA;VENTOLIN HFA) 108 (90 Base) MCG/ACT inhaler, Inhale 2 puffs into the lungs every 6 (six) hours as needed for wheezing or shortness of breath., Disp: 1 Inhaler, Rfl: 1 .  azithromycin (ZITHROMAX)  250 MG tablet, Tale 500 mg PO on day 1, then 250 mg PO q24h x 4 days., Disp: 6 tablet, Rfl: 0 .  chlorpheniramine-HYDROcodone (TUSSIONEX PENNKINETIC ER) 10-8 MG/5ML SUER, Take 5 mLs by mouth at bedtime as needed for cough., Disp: 140 mL, Rfl: 0  EXAM:  VITALS per patient if applicable: BP Readings from Last 3 Encounters:  01/08/19 132/76  12/31/18 132/78  07/08/18 140/90     GENERAL: alert, oriented, appears well and in no acute distress  HEENT: atraumatic, conjunttiva clear, no obvious abnormalities on inspection of external nose and ears  NECK: normal movements of the head and neck  LUNGS: on inspection no signs of respiratory distress, breathing rate appears normal, no obvious gross SOB, gasping or wheezing  CV: no obvious cyanosis  MS: moves all visible extremities without noticeable abnormality  PSYCH/NEURO: pleasant and cooperative, no obvious depression or anxiety, speech and thought processing grossly intact  ASSESSMENT AND PLAN:  Discussed the following assessment and plan: Problem List Items Addressed This Visit      Cardiovascular and Mediastinum   HTN (hypertension)    No recent values. Advised patient to monitor at home.         Other   Anxiety and depression - Primary    Depression screen [facility] 2/9 06/25/2019 01/08/2019 10/18/2017  Decreased Interest 1 1 0  Down, Depressed, Hopeless 1 1 0  PHQ - 2 Score 2 2 0  Altered sleeping 3 1 -  Tired, decreased energy 3 1 -  Change in appetite 2 0 -  Feeling bad or failure about yourself  0 0 -  Trouble concentrating 0 0 -  Moving slowly or fidgety/restless 1 0 -  Suicidal thoughts 0 0 -  PHQ-9 Score 11 4 -  Difficult doing work/chores Somewhat difficult Not difficult at all -   Worse of late, though improving. Will maintain regimen and refer her to counseling.       Relevant Orders   Ambulatory referral to Psychology    Other Visit Diagnoses    History of pneumonia       Relevant Orders   DG Chest 2 View    Screening for breast cancer       Relevant Orders   MM 3D SCREEN BREAST BILATERAL     Of note: she will schedule mammogram.     I discussed the assessment and treatment plan with the patient. The patient was provided an opportunity to ask questions and all were answered. The patient agreed with the plan and demonstrated an understanding of the instructions.   The patient was advised to call back or seek an in-person evaluation if the symptoms worsen or if the condition fails to improve as anticipated.   Mable Paris, FNP

## 2019-06-26 ENCOUNTER — Ambulatory Visit (INDEPENDENT_AMBULATORY_CARE_PROVIDER_SITE_OTHER): Payer: Medicare Other

## 2019-06-26 ENCOUNTER — Encounter: Payer: Self-pay | Admitting: Family

## 2019-06-26 DIAGNOSIS — Z8701 Personal history of pneumonia (recurrent): Secondary | ICD-10-CM | POA: Diagnosis not present

## 2019-06-27 ENCOUNTER — Telehealth: Payer: Self-pay | Admitting: Family

## 2019-06-27 ENCOUNTER — Encounter: Payer: Self-pay | Admitting: Family

## 2019-06-27 DIAGNOSIS — I700 Atherosclerosis of aorta: Secondary | ICD-10-CM | POA: Insufficient documentation

## 2019-06-27 NOTE — Telephone Encounter (Signed)
Left message for patient to call back and schedule Medicare Annual Wellness Visit (AWV) either virtually/audio only  No hx; please schedule at anytime with Denisa O'Brien-Blaney at [facility] for [facility] to schedule

## 2019-06-30 ENCOUNTER — Other Ambulatory Visit: Payer: Self-pay | Admitting: Family

## 2019-06-30 DIAGNOSIS — I10 Essential (primary) hypertension: Secondary | ICD-10-CM

## 2019-07-04 ENCOUNTER — Other Ambulatory Visit: Payer: Self-pay | Admitting: Family

## 2019-07-04 DIAGNOSIS — F329 Major depressive disorder, single episode, unspecified: Secondary | ICD-10-CM

## 2019-07-04 DIAGNOSIS — F32A Depression, unspecified: Secondary | ICD-10-CM

## 2019-07-09 NOTE — Telephone Encounter (Signed)
I looked up patient on [place] Controlled Substances Reporting System and saw no activity that raised concern of inappropriate use.   

## 2019-07-11 ENCOUNTER — Other Ambulatory Visit: Payer: Self-pay | Admitting: Family

## 2019-07-11 DIAGNOSIS — E871 Hypo-osmolality and hyponatremia: Secondary | ICD-10-CM

## 2019-07-30 ENCOUNTER — Ambulatory Visit (INDEPENDENT_AMBULATORY_CARE_PROVIDER_SITE_OTHER): Payer: Medicare Other

## 2019-07-30 ENCOUNTER — Other Ambulatory Visit: Payer: Self-pay

## 2019-07-30 DIAGNOSIS — I77810 Thoracic aortic ectasia: Secondary | ICD-10-CM

## 2019-07-30 DIAGNOSIS — I350 Nonrheumatic aortic (valve) stenosis: Secondary | ICD-10-CM | POA: Diagnosis not present

## 2019-08-01 ENCOUNTER — Telehealth: Payer: Medicare Other | Admitting: Family

## 2019-08-01 DIAGNOSIS — H019 Unspecified inflammation of eyelid: Secondary | ICD-10-CM | POA: Diagnosis not present

## 2019-08-01 MED ORDER — ERYTHROMYCIN 5 MG/GM OP OINT: 1.0000 "application " | TOPICAL_OINTMENT | Freq: Four times a day (QID) | OPHTHALMIC | 0 refills | Status: DC

## 2019-08-01 NOTE — Progress Notes (Signed)
We are sorry that you are not feeling well.  Here is how we plan to help!  Based on what you have shared with me it looks like you have an infection of the eyelid that will likely clear on its own with warm compresses. However, if it worsens over the weekend, I have sent in medication for you to the pharmacy.  Erythromycin opthalmic ointment   Strict handwashing is suggested with soap and water is urged.  If not available, use alcohol based had sanitizer.  Avoid unnecessary touching of the eye.  If you wear contact lenses, you will need to refrain from wearing them until you see no white discharge from the eye for at least 24 hours after being on medication.  You should see symptom improvement in 1-2 days after starting the medication regimen.  Call us if symptoms are not improved in 1-2 days.  Home Care:  Wash your hands often!  Do not wear your contacts until you complete your treatment plan.  Avoid sharing towels, bed linen, personal items with a person who has pink eye.  See attention for anyone in your home with similar symptoms.  Get Help Right Away If:  Your symptoms do not improve.  You develop blurred or loss of vision.  Your symptoms worsen (increased discharge, pain or redness)  Your e-visit answers were reviewed by a board certified advanced clinical practitioner to complete your personal care plan.  Depending on the condition, your plan could have included both over the counter or prescription medications.  If there is a problem please reply  once you have received a response from your provider.  Your safety is important to [place].  If you have drug allergies check your prescription carefully.    You can use MyChart to ask questions about today's visit, request a non-urgent call back, or ask for a work or school excuse for 24 hours related to this e-Visit. If it has been greater than 24 hours you will need to follow up with your provider, or enter a new e-Visit to address  those concerns.   You will get an e-mail in the next two days asking about your experience.  I hope that your e-visit has been valuable and will speed your recovery. Thank you for using e-visits.  Greater than 5 minutes, yet less than 10 minutes of time have been spent researching, coordinating, and implementing care for this patient today.  Thank you for the details you included in the comment boxes. Those details are very helpful in determining the best course of treatment for you and help [place] to provide the best care.

## 2019-08-08 ENCOUNTER — Ambulatory Visit: Payer: Medicare Other | Admitting: Psychology

## 2019-08-11 NOTE — Progress Notes (Deleted)
Cardiology Office Note  Date:  08/11/2019   ID:  Adja, Dadisman 02-17-1956, MRN [id number]  PCP:  Burnard Hawthorne, FNP   No chief complaint on file.   HPI:  Deborah Koch is a very pleasant 63 year old woman with history of  bicuspid aortic valve, moderate aortic valve stenosis , mild -moderate TR 4.1 cm ascending aorta dilation, on echo 2015 not visualized well on echocardiogram since then hypertension,  GERD,  out on disability for scoliosis among other issues prior smoking history Anxiety Takes care of 2 year old mother who has Alzheimer's who presents for routine follow-up of her aortic valve disease.  We reviewed most recent echocardiogram with her in detail No dramatic change in her aortic valve stenosis numbers year-to-year Echo 07/05/2018 Left ventricle: The cavity size was normal. Wall thickness was normal. Systolic function was normal. The estimated ejection fraction was in the range of 55% to 60%. Wall motion was normal  there were no regional wall motion abnormalities. Doppler  parameters are consistent with abnormal left ventricular  relaxation (grade 1 diastolic dysfunction). - Aortic valve: Bicuspid; moderately thickened, moderately calcified leaflets. There was moderate stenosis. Mean gradient (S): 29 mm Hg. - Aorta: Ascending aortic diameter: 44 mm (S). - Mitral valve: There was mild regurgitation. - Tricuspid valve: There was mild-moderate regurgitation. - Pulmonary arteries: Systolic pressure was within the normal range.  Did not qualify for chest CT, ad not smoked enough  Previous echo 12/2013: Mean gradient was 23 mm Hg Reviewed with her  No sx, active Takes care of 46 yo mother Housework Denies any significant shortness of breath or chest pain on heavy exertion Reports blood pressure well controlled higher today likely from anxiety  EKG personally reviewed by myself on todays visit Shows normal sinus rhythm with rate 53 bpm no significant ST or  T-wave changes  Other past medical history reviewed echocardiogram results discussed with her January 2015 Echo: Mean gradient 27, peak gradient 44,  Echocardiogram November 2016,  Mean gradient 31, peak gradient 50, peak velocity 354 cm/s  Echocardiogram May 2018 Mean gradient 28, peak radiant 55, peak velocity 370  Echocardiogram January 2015 and repeat echocardiogram September 2015 essentially unchanged She has 4.1 cm ascending aorta dilation, with moderate aortic valve stenosis, peak velocity 283 cm/s, mean gradient 27 mmHg, peak gradient 44 mmHg .   In August 2015, she reports having diverticulitis, C. difficile requiring long course of antibiotics in recovery   She reports that she used to smoke but stopped 10 years ago. Smoked since he was younger. She reports her cholesterol is very good though she does not know any specifics. No diabetes. Occasional anxiety and takes Klonopin when necessary  PMH:   has a past medical history of Anxiety, C. difficile colitis, Congenital bicuspid aortic valve, Depression, Dilated Ascending Aorta, Diverticulitis, GERD (gastroesophageal reflux disease), H/O mastitis (2015), Heart murmur, History of tobacco abuse, HTN (hypertension), Hypokalemia, Moderate to severe aortic stenosis, Multilevel degenerative disc disease, and Scoliosis.  PSH:    Past Surgical History:  Procedure Laterality Date  . BARTHOLIN GLAND CYST EXCISION    . BREAST BIOPSY Left    neg  . COLONOSCOPY  2013   Dr. Allen Norris  . COLONOSCOPY WITH PROPOFOL N/A 10/23/2017   Procedure: COLONOSCOPY WITH PROPOFOL;  Surgeon: Lucilla Lame, MD;  Location: [facility] ENDOSCOPY;  Service: Endoscopy;  Laterality: N/A;  . OOPHORECTOMY  2016  . OVARY SURGERY  2016   cyst on right ovary    Current Outpatient Medications  Medication Sig Dispense Refill  . albuterol (PROVENTIL HFA;VENTOLIN HFA) 108 (90 Base) MCG/ACT inhaler Inhale 2 puffs into the lungs every 6 (six) hours as needed for wheezing or  shortness of breath. 1 Inhaler 1  . azithromycin (ZITHROMAX) 250 MG tablet Tale 500 mg PO on day 1, then 250 mg PO q24h x 4 days. 6 tablet 0  . chlorpheniramine-HYDROcodone (TUSSIONEX PENNKINETIC ER) 10-8 MG/5ML SUER Take 5 mLs by mouth at bedtime as needed for cough. 140 mL 0  . clonazePAM (KLONOPIN) 0.5 MG tablet TAKE 1 TABLET (0.5 MG TOTAL) BY MOUTH 2 (TWO) TIMES DAILY AS NEEDED. 60 tablet 1  . erythromycin ophthalmic ointment Place 1 application into both eyes 4 (four) times daily. 3.5 g 0  . escitalopram (LEXAPRO) 10 MG tablet Take 1 tablet (10 mg total) by mouth daily. 90 tablet 1  . lisinopril (ZESTRIL) 20 MG tablet TAKE 1 TABLET BY MOUTH EVERY DAY 90 tablet 0  . metoprolol succinate (TOPROL-XL) 100 MG 24 hr tablet TAKE 1 TABLET BY MOUTH DAILY. PLEASE CALL OFFICE FOR BP FOLLOW-UP APPT AS MENTIONED IN MAY. 90 tablet 3   No current facility-administered medications for this visit.      Allergies:   Vicodin [hydrocodone-acetaminophen]   Social History:  The patient  reports that she has been smoking cigarettes. She has a 5.00 pack-year smoking history. She has never used smokeless tobacco. She reports current alcohol use of about 12.0 standard drinks of alcohol per week. She reports that she does not use drugs.   Family History:   family history includes Alcohol abuse in her brother; Anxiety disorder in her mother and sister; Breast cancer in her maternal aunt; Colon cancer in her mother; Depression in her mother and sister; Heart failure in her father; Hypertension in her father and mother; Schizophrenia in her brother.    Review of Systems: Review of Systems  Constitutional: Negative.   Respiratory: Negative.   Cardiovascular: Negative.   Gastrointestinal: Negative.   Musculoskeletal: Negative.   Neurological: Negative.   Psychiatric/Behavioral: Negative.   All other systems reviewed and are negative.    PHYSICAL EXAM: VS:  There were no vitals taken for this visit. , BMI  There is no height or weight on file to calculate BMI. Constitutional:  oriented to person, place, and time. No distress.  HENT:  Head: Normocephalic and atraumatic.  Eyes:  no discharge. No scleral icterus.  Neck: Normal range of motion. Neck supple. No JVD present.  Cardiovascular: Normal rate, regular rhythm, normal heart sounds and intact distal pulses. Exam reveals no gallop and no friction rub. No edema 2/6 systolic ejection murmur heard right sternal border Pulmonary/Chest: Effort normal and breath sounds normal. No stridor. No respiratory distress.  no wheezes.  no rales.  no tenderness.  Abdominal: Soft.  no distension.  no tenderness.  Musculoskeletal: Normal range of motion.  no  tenderness or deformity.  Neurological:  normal muscle tone. Coordination normal. No atrophy Skin: Skin is warm and dry. No rash noted. not diaphoretic.  Psychiatric:  normal mood and affect. behavior is normal. Thought content normal.   Recent Labs: No results found for requested labs within last 8760 hours.    Lipid Panel Lab Results  Component Value Date   CHOL 194 10/09/2016   HDL 89.00 10/09/2016   LDLCALC 90 10/09/2016   TRIG 72.0 10/09/2016      Wt Readings from Last 3 Encounters:  01/08/19 136 lb (61.7 kg)  12/31/18 132 lb (59.9 kg)  07/08/18 133 lb 8 oz (60.6 kg)       ASSESSMENT AND PLAN:  Moderate aortic stenosis - Plan: EKG 12-Lead Bicuspid valve, mild progression in her measurements over the past 4 years Past several echocardiograms reviewed with her in detail Mean gradient 23 up to 29 mmHg since 2015 She is asymptomatic Discussed various types of surgical repair when she has symptoms Repeat echocardiogram 1 year  Essential hypertension - Plan: EKG 12-Lead Blood pressure is well controlled on today's visit. No changes made to the medications.  Ascending aorta dilation (HCC) - Plan: EKG 12-Lead We did discuss CT scan on her last 2 office visits Reports they are  expensive for her No significant change in aorta 4.4 cm over the past several years since 2015, still same size  Congenital bicuspid aortic valve - Plan: EKG 12-Lead Confirmed on echocardiogram We'll continue to monitor with yearly echo  Disposition:   F/U  12 months   Total encounter time more than 25 minutes  Greater than 50% was spent in counseling and coordination of care with the patient    No orders of the defined types were placed in this encounter.    Signed, Esmond Plants, M.D., Ph.D. 08/11/2019  Chance, Felicity

## 2019-08-12 ENCOUNTER — Ambulatory Visit: Payer: Medicare Other | Admitting: Cardiovascular Disease

## 2019-08-13 ENCOUNTER — Ambulatory Visit
Admission: RE | Admit: 2019-08-13 | Discharge: 2019-08-13 | Disposition: A | Discharge status: Home / Self Care | Payer: Medicare Other | Source: Ambulatory Visit | Attending: Family | Admitting: Family

## 2019-08-13 DIAGNOSIS — Z1239 Encounter for other screening for malignant neoplasm of breast: Secondary | ICD-10-CM

## 2019-08-13 DIAGNOSIS — Z1231 Encounter for screening mammogram for malignant neoplasm of breast: Secondary | ICD-10-CM | POA: Diagnosis not present

## 2019-08-17 NOTE — Progress Notes (Signed)
Virtual Visit via Video Note   This visit type was conducted due to national recommendations for restrictions regarding the COVID-19 Pandemic (e.g. social distancing) in an effort to limit this patient's exposure and mitigate transmission in our community.  Due to her co-morbid illnesses, this patient is at least at moderate risk for complications without adequate follow up.  This format is felt to be most appropriate for this patient at this time.  All issues noted in this document were discussed and addressed.  A limited physical exam was performed with this format.  Please refer to the patient's chart for her consent to telehealth for [facility].   I connected with  Rasheed Givler on 08/18/19 by a video enabled telemedicine application and verified that I am speaking with the correct person using two identifiers. I discussed the limitations of evaluation and management by telemedicine. The patient expressed understanding and agreed to proceed.   Evaluation Performed:  Follow-up visit  Date:  08/18/2019   ID:  Jessy, Canez 17-May-1956, MRN [id number]  Patient Location:  [address] [place] [address]   Provider location:   [facility], [place] office  PCP:  Burnard Hawthorne, FNP  Cardiologist:  Arvid Right [facility]   Chief Complaint: Follow-up of her echo , aortic valve stenosis, dilated aorta    History of Present Illness:    Deborah Koch is a 63 y.o. female who presents via audio/video conferencing for a telehealth visit today.   The patient does not symptoms concerning for COVID-19 infection (fever, chills, cough, or new SHORTNESS OF BREATH).   Patient has a past medical history of bicuspid aortic valve, moderate aortic valve stenosis , mild -moderate TR 4.1 cm ascending aorta dilation, on echo 2015 not visualized well on echocardiogram since then hypertension,  GERD,  out on disability for scoliosis among other issues  prior smoking history Anxiety Takes care of 30 year old mother who has Alzheimer's who presents for routine follow-up of her aortic valve disease.  Mom "actively dying" Dementia Lots of stress, mother is on hospice  BP ok, typically [id number] systolic Denies SOB with activities Chest pain from stress, better with klonopin.  Blames her mother for the stress  Good ADLs, groceries.  No gait or exercise program  Echo reviewed with her in detail, date of study 8/ 2020   1. The left ventricle has normal systolic function with an ejection fraction of 60-65%. The cavity size was normal. Left ventricular diastolic Doppler parameters are consistent with impaired relaxation.  2. The right ventricle has normal systolic function. The cavity was normal. There is no increase in right ventricular wall thickness. Right ventricular systolic pressure is mildly elevated with an estimated pressure of 38.2 mmHg.  3. Left atrial size was mildly dilated.  4. Tricuspid valve regurgitation is mild-moderate.  5. The aortic valve is bicuspid. Moderate thickening of the aortic valve. Aortic valve regurgitation is mild by color flow Doppler. Moderate-severe stenosis of the aortic valve by Mean gradient of 30 mm Hg, peak gradient 56 mm Hg, peak velocity 376  cm/sec, Severe AS by estimated AVA 0.84 cm sq  6. There is dilatation of the aortic root 3.2 cm and of the ascending aorta 4.5 cm.  7. No significant change in aorta dilation, severity of aortic valve stenosis when compared   Minimal change from 2019  Other past medical history reviewed  In August 2015, she reports having diverticulitis, C. difficile requiring long course  of antibiotics in recovery   She reports that she used to smoke but stopped >10 years ago. Smoked since he was younger. She reports her cholesterol is very good though she does not know any specifics. No diabetes. Occasional anxiety and takes Klonopin when necessary   Prior CV studies:   The  following studies were reviewed today:    Past Medical History:  Diagnosis Date  . Anxiety   . C. difficile colitis    a. 07/2014.  [name] Congenital bicuspid aortic valve   . Depression   . Dilated Ascending Aorta    a. 12/2013 Echo: 4.45cm.  . Diverticulitis    hospitalized; subsequent c diff infection.   [name] GERD (gastroesophageal reflux disease)   . H/O mastitis 2015  . Heart murmur   . History of tobacco abuse    a. 20 yrs, 1/4 ppd - quit.  [name] HTN (hypertension)   . Hypokalemia   . Moderate to severe aortic stenosis    a. 12/2013 Echo: EF 60-65%, Grade 1 DD, bicuspid AoV with mod-sev AS [90mmHg mean gradient, AoV area 0.69cm^2 (VTI)], mod dil ascending Ao - 4.45cm, mild TR, PASP 14mmHg.  . Multilevel degenerative disc disease   . Scoliosis    Past Surgical History:  Procedure Laterality Date  . BARTHOLIN GLAND CYST EXCISION    . BREAST BIOPSY Left    neg  . COLONOSCOPY  2013   Dr. Allen Norris  . COLONOSCOPY WITH PROPOFOL N/A 10/23/2017   Procedure: COLONOSCOPY WITH PROPOFOL;  Surgeon: Lucilla Lame, MD;  Location: [facility] ENDOSCOPY;  Service: Endoscopy;  Laterality: N/A;  . OOPHORECTOMY  2016  . OVARY SURGERY  2016   cyst on right ovary      Allergies:   Vicodin [hydrocodone-acetaminophen]   Social History   Tobacco Use  . Smoking status: Current Every Day Smoker    Packs/day: 0.25    Years: 20.00    Pack years: 5.00    Types: Cigarettes    Last attempt to quit: 06/07/2005    Years since quitting: 14.2  . Smokeless tobacco: Never Used  Substance Use Topics  . Alcohol use: Yes    Alcohol/week: 12.0 standard drinks    Types: 2 Cans of beer, 10 Glasses of wine per week    Comment:    . Drug use: No     Current Outpatient Medications on File Prior to Visit  Medication Sig Dispense Refill  . clonazePAM (KLONOPIN) 0.5 MG tablet TAKE 1 TABLET (0.5 MG TOTAL) BY MOUTH 2 (TWO) TIMES DAILY AS NEEDED. 60 tablet 1  . escitalopram (LEXAPRO) 10 MG tablet Take 1 tablet (10 mg total) by  mouth daily. 90 tablet 1  . lisinopril (ZESTRIL) 20 MG tablet TAKE 1 TABLET BY MOUTH EVERY DAY 90 tablet 0  . metoprolol succinate (TOPROL-XL) 100 MG 24 hr tablet TAKE 1 TABLET BY MOUTH DAILY. PLEASE CALL OFFICE FOR BP FOLLOW-UP APPT AS MENTIONED IN MAY. 90 tablet 3  . albuterol (PROVENTIL HFA;VENTOLIN HFA) 108 (90 Base) MCG/ACT inhaler Inhale 2 puffs into the lungs every 6 (six) hours as needed for wheezing or shortness of breath. 1 Inhaler 1  . azithromycin (ZITHROMAX) 250 MG tablet Tale 500 mg PO on day 1, then 250 mg PO q24h x 4 days. 6 tablet 0  . chlorpheniramine-HYDROcodone (TUSSIONEX PENNKINETIC ER) 10-8 MG/5ML SUER Take 5 mLs by mouth at bedtime as needed for cough. 140 mL 0   No current facility-administered medications on file prior to visit.  Family Hx: The patient's family history includes Alcohol abuse in her brother; Anxiety disorder in her mother and sister; Breast cancer in her maternal aunt; Colon cancer in her mother; Depression in her mother and sister; Heart failure in her father; Hypertension in her father and mother; Schizophrenia in her brother. There is no history of Heart attack or Stroke.  ROS:   Please see the history of present illness.    Review of Systems  Constitutional: Negative.   HENT: Negative.   Respiratory: Negative.   Cardiovascular: Negative.   Gastrointestinal: Negative.   Musculoskeletal: Negative.   Neurological: Negative.   Psychiatric/Behavioral: The patient is nervous/anxious.   All other systems reviewed and are negative.     Labs/Other Tests and Data Reviewed:    Recent Labs: No results found for requested labs within last 8760 hours.   Recent Lipid Panel Lab Results  Component Value Date/Time   CHOL 194 10/09/2016 09:01 AM   TRIG 72.0 10/09/2016 09:01 AM   HDL 89.00 10/09/2016 09:01 AM   CHOLHDL 2 10/09/2016 09:01 AM   LDLCALC 90 10/09/2016 09:01 AM    Wt Readings from Last 3 Encounters:  08/18/19 130 lb (59 kg)   01/08/19 136 lb (61.7 kg)  12/31/18 132 lb (59.9 kg)     Exam:    Vital Signs: Vital signs may also be detailed in the HPI BP 120/80   Ht 5\' 6"  (1.676 m)   Wt 130 lb (59 kg)   BMI 20.98 kg/m   Wt Readings from Last 3 Encounters:  08/18/19 130 lb (59 kg)  01/08/19 136 lb (61.7 kg)  12/31/18 132 lb (59.9 kg)   Temp Readings from Last 3 Encounters:  01/08/19 98 F (36.7 C)  12/31/18 98.5 F (36.9 C) (Oral)  05/01/18 98.1 F (36.7 C) (Oral)   BP Readings from Last 3 Encounters:  08/18/19 120/80  01/08/19 132/76  12/31/18 132/78   Pulse Readings from Last 3 Encounters:  01/08/19 64  12/31/18 71  07/08/18 (!) 36     Well nourished, well developed female in no acute distress. Constitutional:  oriented to person, place, and time. No distress.  Head: Normocephalic and atraumatic.  Eyes:  no discharge. No scleral icterus.  Neck: Normal range of motion. Neck supple.  Pulmonary/Chest: No audible wheezing, no distress, appears comfortable Musculoskeletal: Normal range of motion.  no  tenderness or deformity.  Neurological:   Coordination normal. Full exam not performed Skin:  No rash Psychiatric:  normal mood and affect. behavior is normal. Thought content normal.    ASSESSMENT & PLAN:    Problem List Items Addressed This Visit      Cardiology Problems   HTN (hypertension)   Moderate to severe aortic stenosis   Ascending aorta dilation (HCC) - Primary   Congenital bicuspid aortic valve     Long discussion concerning aortic valve stenosis She denies any significant symptoms, good ADLs Aortic valve measurements moderate to severe stenosis, possibly severe by estimated AVA Unclear if she would be a TAVR candidate given ascending dilated aorta (though reasonably sized root) and bicuspid aortic valve  -She is interested in meeting with CT surgery at a later date, possibly end of the year or January.  Currently has significant stressors with her mother's health At the  very least we will schedule repeat echocardiogram into 2021  Dilated ascending aorta Approximately the same size, 4.4 cm Aortic root with minimal dilation  Hypertension Blood pressure is well controlled on today's visit. No  changes made to the medications.   COVID-19 Education: The signs and symptoms of COVID-19 were discussed with the patient and how to seek care for testing (follow up with PCP or arrange E-visit).  The importance of social distancing was discussed today.  Patient Risk:   After full review of this patients clinical status, I feel that they are at least moderate risk at this time.  Time:   Today, I have spent 25 minutes with the patient with telehealth technology discussing the cardiac and medical problems/diagnoses detailed above   Additional 10 min spent reviewing the chart prior to patient visit today   Medication Adjustments/Labs and Tests Ordered: Current medicines are reviewed at length with the patient today.  Concerns regarding medicines are outlined above.   Tests Ordered: No tests ordered   Medication Changes: No changes made   Disposition: Follow-up in 12 months   Signed, Ida Rogue, MD  [place] Office [address], [place], [place] [address]

## 2019-08-18 ENCOUNTER — Encounter: Payer: Self-pay | Admitting: Cardiovascular Disease

## 2019-08-18 ENCOUNTER — Other Ambulatory Visit: Payer: Self-pay

## 2019-08-18 ENCOUNTER — Telehealth (INDEPENDENT_AMBULATORY_CARE_PROVIDER_SITE_OTHER): Payer: Medicare Other | Admitting: Cardiovascular Disease

## 2019-08-18 VITALS — BP 120/80 | Ht 66.0 in | Wt 130.0 lb

## 2019-08-18 DIAGNOSIS — I350 Nonrheumatic aortic (valve) stenosis: Secondary | ICD-10-CM

## 2019-08-18 DIAGNOSIS — Q231 Congenital insufficiency of aortic valve: Secondary | ICD-10-CM | POA: Diagnosis not present

## 2019-08-18 DIAGNOSIS — I700 Atherosclerosis of aorta: Secondary | ICD-10-CM | POA: Diagnosis not present

## 2019-08-18 DIAGNOSIS — I10 Essential (primary) hypertension: Secondary | ICD-10-CM

## 2019-08-18 DIAGNOSIS — Q2381 Bicuspid aortic valve: Secondary | ICD-10-CM

## 2019-08-18 DIAGNOSIS — I77810 Thoracic aortic ectasia: Secondary | ICD-10-CM | POA: Diagnosis not present

## 2019-08-18 NOTE — Patient Instructions (Addendum)
Echo in one year for aortic valve stenosis, dilated ascending aorta  Medication Instructions:  No changes  If you need a refill on your cardiac medications before your next appointment, please call your pharmacy.    Lab work: No new labs needed   If you have labs (blood work) drawn today and your tests are completely normal, you will receive your results only by: [name] MyChart Message (if you have MyChart) OR . A paper copy in the mail If you have any lab test that is abnormal or we need to change your treatment, we will call you to review the results.   Testing/Procedures: Your physician has requested that you have an echocardiogram in one year. Echocardiography is a painless test that uses sound waves to create images of your heart. It provides your doctor with information about the size and shape of your heart and how well your heart's chambers and valves are working. This procedure takes approximately one hour. There are no restrictions for this procedure. You may get an IV, if needed, to receive an ultrasound enhancing agent through to better visualize your heart.    Follow-Up: At [facility], you and your health needs are our priority.  As part of our continuing mission to provide you with exceptional heart care, we have created designated Provider Care Teams.  These Care Teams include your primary Cardiologist (physician) and Advanced Practice Providers (APPs -  Physician Assistants and Nurse Practitioners) who all work together to provide you with the care you need, when you need it.  . You will need a follow up appointment in 12 months .   Please call our office 2 months in advance to schedule this appointment.    . Providers on your designated Care Team:   . Murray Hodgkins, NP . Christell Faith, PA-C . Marrianne Mood, PA-C  Any Other Special Instructions Will Be Listed Below (If Applicable).  For educational health videos Log in to : [email or web address] Or : [email or web address],  password : [email or web address]

## 2019-08-19 ENCOUNTER — Telehealth: Payer: Self-pay | Admitting: Family

## 2019-08-19 NOTE — Telephone Encounter (Signed)
Pt says that she was prescribed a eye ointment to help with a spot on her eye. Pt says that it has cleared up a lot but can still see a place on the area. Pt would like to know if provider could advise?    [place]

## 2019-08-20 NOTE — Telephone Encounter (Signed)
Noted. Agree with advice.  

## 2019-09-02 ENCOUNTER — Other Ambulatory Visit: Payer: Self-pay | Admitting: Family

## 2019-09-02 DIAGNOSIS — F419 Anxiety disorder, unspecified: Secondary | ICD-10-CM

## 2019-09-02 DIAGNOSIS — F329 Major depressive disorder, single episode, unspecified: Secondary | ICD-10-CM

## 2019-09-02 DIAGNOSIS — F32A Depression, unspecified: Secondary | ICD-10-CM

## 2019-09-03 NOTE — Telephone Encounter (Signed)
Refilled: 07/09/2019 Last OV: 06/25/2019 Next OV: not scheduled

## 2019-09-03 NOTE — Telephone Encounter (Signed)
I looked up patient on [place] Controlled Substances Reporting System and saw no activity that raised concern of inappropriate use.   

## 2019-10-04 ENCOUNTER — Other Ambulatory Visit: Payer: Self-pay | Admitting: Family

## 2019-10-04 DIAGNOSIS — F32A Depression, unspecified: Secondary | ICD-10-CM

## 2019-10-04 DIAGNOSIS — F329 Major depressive disorder, single episode, unspecified: Secondary | ICD-10-CM

## 2019-10-04 DIAGNOSIS — F419 Anxiety disorder, unspecified: Secondary | ICD-10-CM

## 2019-10-27 ENCOUNTER — Other Ambulatory Visit: Payer: Self-pay | Admitting: Family

## 2019-10-27 DIAGNOSIS — F329 Major depressive disorder, single episode, unspecified: Secondary | ICD-10-CM

## 2019-10-27 DIAGNOSIS — F419 Anxiety disorder, unspecified: Secondary | ICD-10-CM

## 2019-10-27 DIAGNOSIS — F32A Depression, unspecified: Secondary | ICD-10-CM

## 2019-11-27 ENCOUNTER — Other Ambulatory Visit: Payer: Self-pay | Admitting: Family

## 2019-11-27 DIAGNOSIS — E871 Hypo-osmolality and hyponatremia: Secondary | ICD-10-CM

## 2019-12-21 ENCOUNTER — Other Ambulatory Visit: Payer: Self-pay | Admitting: Internal Medicine

## 2019-12-21 ENCOUNTER — Other Ambulatory Visit: Payer: Self-pay | Admitting: Family

## 2019-12-21 DIAGNOSIS — F32A Depression, unspecified: Secondary | ICD-10-CM

## 2019-12-21 DIAGNOSIS — F329 Major depressive disorder, single episode, unspecified: Secondary | ICD-10-CM

## 2019-12-21 DIAGNOSIS — E871 Hypo-osmolality and hyponatremia: Secondary | ICD-10-CM

## 2019-12-22 MED ORDER — CLONAZEPAM 0.5 MG PO TABS: 0.5000 mg | ORAL_TABLET | Freq: Two times a day (BID) | ORAL | 1 refills | Status: DC | PRN

## 2019-12-22 NOTE — Telephone Encounter (Signed)
Deborah Koch, The first rx for this printed at work.  PLEASE SHRED   Call pt   I have refilled your klonopin  However I wanted to remind you that this is controlled substance.   In order for me to prescribe medication,  patients must be seen every 3 months.   Please make follow-up appointment this month for any further refills.    I looked up patient on [place] Controlled Substances Reporting System and saw no activity that raised concern of inappropriate use.

## 2019-12-23 NOTE — Telephone Encounter (Signed)
Mychart message sent.

## 2020-01-26 ENCOUNTER — Encounter: Payer: Medicare Other | Admitting: Family

## 2020-01-28 ENCOUNTER — Telehealth: Payer: Self-pay | Admitting: Family

## 2020-01-28 ENCOUNTER — Ambulatory Visit (INDEPENDENT_AMBULATORY_CARE_PROVIDER_SITE_OTHER): Payer: Medicare Other | Admitting: Family

## 2020-01-28 ENCOUNTER — Other Ambulatory Visit: Payer: Self-pay

## 2020-01-28 ENCOUNTER — Encounter: Payer: Self-pay | Admitting: Family

## 2020-01-28 VITALS — BP 128/76 | Ht 64.0 in | Wt 132.0 lb

## 2020-01-28 DIAGNOSIS — F32A Depression, unspecified: Secondary | ICD-10-CM

## 2020-01-28 DIAGNOSIS — I10 Essential (primary) hypertension: Secondary | ICD-10-CM

## 2020-01-28 DIAGNOSIS — F329 Major depressive disorder, single episode, unspecified: Secondary | ICD-10-CM

## 2020-01-28 DIAGNOSIS — R5383 Other fatigue: Secondary | ICD-10-CM | POA: Diagnosis not present

## 2020-01-28 DIAGNOSIS — F419 Anxiety disorder, unspecified: Secondary | ICD-10-CM

## 2020-01-28 NOTE — Assessment & Plan Note (Addendum)
BP Readings from Last 3 Encounters:  01/28/20 128/76  08/18/19 120/80  01/08/19 132/76  At goal, continue current regimen

## 2020-01-28 NOTE — Progress Notes (Signed)
Virtual Visit via Video Note  I connected with@  on 01/28/20 at 11:00 AM EST by a video enabled telemedicine application and verified that I am speaking with the correct person using two identifiers.  Location patient: home Location provider:work  Persons participating in the virtual visit: patient, provider  I discussed the limitations of evaluation and management by telemedicine and the availability of in person appointments. The patient expressed understanding and agreed to proceed.   HPI: Follow up.  Complains of fatigue for a couple of years.  Thinks she had been 'on go' when caring for mother/brother when stayed in state of 'high alert' . Feels like catching up on the past couple of years. Feels like cannot get enough sleep, may sleep 10-11 hours per night. No napping. Feels restored after sleep.  Walking one mile most days which resolves fatigue.  Mother and brother passed away 6 months ago. Knows mother is no longer suffering. Getting more rest since no longer caregiver.   HTN- at home 126/76. Didn't recall HR. Once and a while when leans over to pick up something on the floor, may have dizziness.Doesnt occur every time.  Denies exertional chest pain or pressure, numbness or tingling radiating to left arm or jaw, palpitations,, frequent headaches, changes in vision, or shortness of breath.   Anxiety and depression- feels that anxiety has decreased since not a caregiver anymore.feels 'relaxed.' Medications are working well. Klonopin 0.5mg  BID. No si/hi.   ROS: See pertinent positives and negatives per HPI.  Past Medical History:  Diagnosis Date  . Anxiety   . C. difficile colitis    a. 07/2014.  [name] Congenital bicuspid aortic valve   . Depression   . Dilated Ascending Aorta    a. 12/2013 Echo: 4.45cm.  . Diverticulitis    hospitalized; subsequent c diff infection.   [name] GERD (gastroesophageal reflux disease)   . H/O mastitis 2015  . Heart murmur   . History of tobacco abuse     a. 20 yrs, 1/4 ppd - quit.  [name] HTN (hypertension)   . Hypokalemia   . Moderate to severe aortic stenosis    a. 12/2013 Echo: EF 60-65%, Grade 1 DD, bicuspid AoV with mod-sev AS [86mmHg mean gradient, AoV area 0.69cm^2 (VTI)], mod dil ascending Ao - 4.45cm, mild TR, PASP 71mmHg.  . Multilevel degenerative disc disease   . Scoliosis     Past Surgical History:  Procedure Laterality Date  . BARTHOLIN GLAND CYST EXCISION    . BREAST BIOPSY Left    neg  . COLONOSCOPY  2013   Dr. Allen Norris  . COLONOSCOPY WITH PROPOFOL N/A 10/23/2017   Procedure: COLONOSCOPY WITH PROPOFOL;  Surgeon: Lucilla Lame, MD;  [facility] ENDOSCOPY;  Service: Endoscopy;  Laterality: N/A;  . OOPHORECTOMY  2016  . OVARY SURGERY  2016   cyst on right ovary    Family History  Problem Relation Age of Onset  . Hypertension Mother   . Colon cancer Mother   . Anxiety disorder Mother   . Depression Mother   . Heart failure Father   . Hypertension Father   . Depression Sister   . Anxiety disorder Sister   . Alcohol abuse Brother   . Schizophrenia Brother   . Breast cancer Maternal Aunt   . Heart attack Neg Hx   . Stroke Neg Hx     SOCIAL HX: smoker   Current Outpatient Medications:  .  clonazePAM (KLONOPIN) 0.5 MG tablet, Take 1 tablet (0.5 mg total)  by mouth 2 (two) times daily as needed., Disp: 60 tablet, Rfl: 1 .  escitalopram (LEXAPRO) 10 MG tablet, TAKE 1 TABLET BY MOUTH EVERY DAY, Disp: 90 tablet, Rfl: 1 .  lisinopril (ZESTRIL) 20 MG tablet, TAKE 1 TABLET BY MOUTH EVERY DAY, Disp: 90 tablet, Rfl: 0 .  metoprolol succinate (TOPROL-XL) 100 MG 24 hr tablet, TAKE 1 TABLET BY MOUTH DAILY. PLEASE CALL OFFICE FOR BP FOLLOW-UP APPT AS MENTIONED IN MAY., Disp: 90 tablet, Rfl: 3  EXAM:  VITALS per patient if applicable:  GENERAL: alert, oriented, appears well and in no acute distress  HEENT: atraumatic, conjunttiva clear, no obvious abnormalities on inspection of external nose and ears  NECK: normal movements  of the head and neck  LUNGS: on inspection no signs of respiratory distress, breathing rate appears normal, no obvious gross SOB, gasping or wheezing  CV: no obvious cyanosis  MS: moves all visible extremities without noticeable abnormality  PSYCH/NEURO: pleasant and cooperative, no obvious depression or anxiety, speech and thought processing grossly intact  ASSESSMENT AND PLAN:  Discussed the following assessment and plan:  Fatigue, unspecified type - Plan: CBC with Differential/Platelet, Comprehensive metabolic panel, Hemoglobin A1c, Lipid panel, TSH, VITAMIN D 25 Hydroxy (Vit-D Deficiency, Fractures), B12 and Folate Panel  Essential hypertension  Anxiety and depression - Plan: Ambulatory referral to Psychology Problem List Items Addressed This Visit      Cardiovascular and Mediastinum   HTN (hypertension)    BP Readings from Last 3 Encounters:  01/28/20 128/76  08/18/19 120/80  01/08/19 132/76  At goal, continue current regimen         Other   Anxiety and depression    Improved as no longer caregiver. Agreed to speak with counselor in regards to grief. Otherwise ,continue current regimen.       Relevant Orders   Ambulatory referral to Psychology   Fatigue - Primary    Suspect multifactorial. ? Grief  Advised to increase exercise.  Also advised to stay on  Routine in regards to sleep and perhaps not sleep 10 hours a night, however closer to 8.  Labs ordered today to look for underlying metabolic reasons as well.  Advised her to let me know if the symptom persists.  Follow-up in 3 months      Relevant Orders   CBC with Differential/Platelet   Comprehensive metabolic panel   Hemoglobin A1c   Lipid panel   TSH   VITAMIN D 25 Hydroxy (Vit-D Deficiency, Fractures)   B12 and Folate Panel      -we discussed possible serious and likely etiologies, options for evaluation and workup, limitations of telemedicine visit vs in person visit, treatment, treatment risks and  precautions. Pt prefers to treat via telemedicine empirically rather then risking or undertaking an in person visit at this moment. Patient agrees to seek prompt in person care if worsening, new symptoms arise, or if is not improving with treatment.   I discussed the assessment and treatment plan with the patient. The patient was provided an opportunity to ask questions and all were answered. The patient agreed with the plan and demonstrated an understanding of the instructions.   The patient was advised to call back or seek an in-person evaluation if the symptoms worsen or if the condition fails to improve as anticipated.   Mable Paris, FNP

## 2020-01-28 NOTE — Assessment & Plan Note (Addendum)
Suspect multifactorial. ? Grief  Advised to increase exercise.  Also advised to stay on  Routine in regards to sleep and perhaps not sleep 10 hours a night, however closer to 8.  Labs ordered today to look for underlying metabolic reasons as well.  Advised her to let me know if the symptom persists.  Follow-up in 3 months

## 2020-01-28 NOTE — Assessment & Plan Note (Addendum)
Improved as no longer caregiver. Agreed to speak with counselor in regards to grief. Otherwise ,continue current regimen.

## 2020-01-28 NOTE — Telephone Encounter (Signed)
Left voicemail to schedule pt fasting labs when possible and 3 month follow up.

## 2020-02-19 ENCOUNTER — Other Ambulatory Visit: Payer: Medicare Other

## 2020-02-22 ENCOUNTER — Other Ambulatory Visit: Payer: Self-pay | Admitting: Family

## 2020-02-22 DIAGNOSIS — F32A Depression, unspecified: Secondary | ICD-10-CM

## 2020-02-22 DIAGNOSIS — F329 Major depressive disorder, single episode, unspecified: Secondary | ICD-10-CM

## 2020-02-22 DIAGNOSIS — F419 Anxiety disorder, unspecified: Secondary | ICD-10-CM

## 2020-02-23 NOTE — Telephone Encounter (Signed)
Refill request for klonopin, last seen 01-28-20, last filled 12-22-19.  Please advise.

## 2020-02-23 NOTE — Telephone Encounter (Signed)
I looked up patient on [place] Controlled Substances Reporting System and saw no activity that raised concern of inappropriate use.   

## 2020-02-25 ENCOUNTER — Telehealth: Payer: Self-pay | Admitting: Family

## 2020-02-25 NOTE — Telephone Encounter (Signed)
Pt does not need this now. Disregard

## 2020-02-25 NOTE — Telephone Encounter (Signed)
Pt called she is needing a letter stating that she cannot do jury duty because of her disability  Pt needs this asap

## 2020-02-25 NOTE — Telephone Encounter (Signed)
err

## 2020-02-26 NOTE — Telephone Encounter (Signed)
Noted  

## 2020-03-03 ENCOUNTER — Ambulatory Visit: Payer: Medicare Other | Admitting: Psychology

## 2020-04-12 ENCOUNTER — Other Ambulatory Visit: Payer: Self-pay | Admitting: Family

## 2020-04-12 DIAGNOSIS — F329 Major depressive disorder, single episode, unspecified: Secondary | ICD-10-CM

## 2020-04-12 DIAGNOSIS — F32A Depression, unspecified: Secondary | ICD-10-CM

## 2020-04-15 ENCOUNTER — Telehealth: Payer: Self-pay | Admitting: Family

## 2020-04-15 NOTE — Telephone Encounter (Signed)
Refill request for Klonopin, last seen 01-28-20, last filled 02-23-20.  Please advise.

## 2020-04-15 NOTE — Telephone Encounter (Signed)
Pt needs a refill on clonazePAM (KLONOPIN) 0.5 MG tablet

## 2020-04-18 ENCOUNTER — Encounter: Payer: Self-pay | Admitting: Family

## 2020-05-04 ENCOUNTER — Other Ambulatory Visit: Payer: Self-pay | Admitting: Family

## 2020-05-04 DIAGNOSIS — E871 Hypo-osmolality and hyponatremia: Secondary | ICD-10-CM

## 2020-05-05 ENCOUNTER — Other Ambulatory Visit: Payer: Self-pay

## 2020-05-05 ENCOUNTER — Ambulatory Visit (INDEPENDENT_AMBULATORY_CARE_PROVIDER_SITE_OTHER): Payer: Medicare Other | Admitting: Family

## 2020-05-05 ENCOUNTER — Encounter: Payer: Self-pay | Admitting: Family

## 2020-05-05 DIAGNOSIS — F419 Anxiety disorder, unspecified: Secondary | ICD-10-CM | POA: Diagnosis not present

## 2020-05-05 DIAGNOSIS — I10 Essential (primary) hypertension: Secondary | ICD-10-CM | POA: Diagnosis not present

## 2020-05-05 DIAGNOSIS — R5383 Other fatigue: Secondary | ICD-10-CM

## 2020-05-05 DIAGNOSIS — F329 Major depressive disorder, single episode, unspecified: Secondary | ICD-10-CM | POA: Diagnosis not present

## 2020-05-05 DIAGNOSIS — F32A Depression, unspecified: Secondary | ICD-10-CM

## 2020-05-05 LAB — CBC WITH DIFFERENTIAL/PLATELET
Basophils Absolute: 0.1 10*3/uL (ref 0.0–0.1)
Basophils Relative: 1.4 % (ref 0.0–3.0)
Eosinophils Absolute: 0.1 10*3/uL (ref 0.0–0.7)
Eosinophils Relative: 2.4 % (ref 0.0–5.0)
HCT: 37.6 % (ref 36.0–46.0)
Hemoglobin: 13 g/dL (ref 12.0–15.0)
Lymphocytes Relative: 26.7 % (ref 12.0–46.0)
Lymphs Abs: 1.4 10*3/uL (ref 0.7–4.0)
MCHC: 34.5 g/dL (ref 30.0–36.0)
MCV: 96.8 fl (ref 78.0–100.0)
Monocytes Absolute: 0.5 10*3/uL (ref 0.1–1.0)
Monocytes Relative: 8.5 % (ref 3.0–12.0)
Neutro Abs: 3.3 10*3/uL (ref 1.4–7.7)
Neutrophils Relative %: 61 % (ref 43.0–77.0)
Platelets: 332 10*3/uL (ref 150.0–400.0)
RBC: 3.88 Mil/uL (ref 3.87–5.11)
RDW: 13.4 % (ref 11.5–15.5)
WBC: 5.4 10*3/uL (ref 4.0–10.5)

## 2020-05-05 LAB — B12 AND FOLATE PANEL
Folate: 13.1 ng/mL (ref >5.9)
Vitamin B-12: 233 pg/mL (ref 211–911)

## 2020-05-05 LAB — HEMOGLOBIN A1C: Hgb A1c MFr Bld: 5.6 % (ref 4.6–6.5)

## 2020-05-05 LAB — VITAMIN D 25 HYDROXY (VIT D DEFICIENCY, FRACTURES): VITD: 48.89 ng/mL (ref 30.00–100.00)

## 2020-05-05 LAB — COMPREHENSIVE METABOLIC PANEL
ALT: 11 U/L (ref 0–35)
AST: 15 U/L (ref 0–37)
Albumin: 4.6 g/dL (ref 3.5–5.2)
Alkaline Phosphatase: 89 U/L (ref 39–117)
BUN: 15 mg/dL (ref 6–23)
CO2: 29 mEq/L (ref 19–32)
Calcium: 9.8 mg/dL (ref 8.4–10.5)
Chloride: 101 mEq/L (ref 96–112)
Creatinine, Ser: 0.76 mg/dL (ref 0.40–1.20)
GFR: 76.39 mL/min (ref >60.00)
Glucose, Bld: 96 mg/dL (ref 70–99)
Potassium: 4.4 mEq/L (ref 3.5–5.1)
Sodium: 135 mEq/L (ref 135–145)
Total Bilirubin: 0.4 mg/dL (ref 0.2–1.2)
Total Protein: 7.4 g/dL (ref 6.0–8.3)

## 2020-05-05 LAB — TSH: TSH: 2.15 u[IU]/mL (ref 0.35–4.50)

## 2020-05-05 LAB — LIPID PANEL
Cholesterol: 210 mg/dL — ABNORMAL HIGH (ref 0–200)
HDL: 86 mg/dL (ref >39.00)
LDL Cholesterol: 104 mg/dL — ABNORMAL HIGH (ref 0–99)
NonHDL: 123.85
Total CHOL/HDL Ratio: 2
Triglycerides: 98 mg/dL (ref 0.0–149.0)
VLDL: 19.6 mg/dL (ref 0.0–40.0)

## 2020-05-05 NOTE — Assessment & Plan Note (Signed)
Somewhat stable.  Stress of marriage is clearly weighing on patient.  She politely declines  changes to medication regimen or counseling at this time.  She will let me know if she is anything at all.

## 2020-05-05 NOTE — Assessment & Plan Note (Signed)
Stable, continue regimen. 

## 2020-05-05 NOTE — Patient Instructions (Signed)
Labs are in system  Let me know if you need anything at all.

## 2020-05-05 NOTE — Progress Notes (Signed)
Subjective:    Patient ID: Deborah Koch, female    DOB: 1956-04-22, 64 y.o.   MRN: [id number]  CC: Deborah Koch is a 64 y.o. female who presents today for follow up.   HPI: Anxiety and depression- feels medication is where it is needs to be. Describes feeling unhappy in her  Marriage and feeling like her husband is not making an effort in her marriage. She feels safe and denies physical or emotional abuse. She would like him to be more emotionally intimate.Taking klonopin BID twice per day which is helpful.  HTN-compliant with medication.  No chest pain, shortness of breath  Walking every other day and feels that helps with depression and sleep.      HISTORY:  Past Medical History:  Diagnosis Date  . Anxiety   . C. difficile colitis    a. 07/2014.  [name] Congenital bicuspid aortic valve   . Depression   . Dilated Ascending Aorta    a. 12/2013 Echo: 4.45cm.  . Diverticulitis    hospitalized; subsequent c diff infection.   [name] GERD (gastroesophageal reflux disease)   . H/O mastitis 2015  . Heart murmur   . History of tobacco abuse    a. 20 yrs, 1/4 ppd - quit.  [name] HTN (hypertension)   . Hypokalemia   . Moderate to severe aortic stenosis    a. 12/2013 Echo: EF 60-65%, Grade 1 DD, bicuspid AoV with mod-sev AS [85mmHg mean gradient, AoV area 0.69cm^2 (VTI)], mod dil ascending Ao - 4.45cm, mild TR, PASP 57mmHg.  . Multilevel degenerative disc disease   . Scoliosis    Past Surgical History:  Procedure Laterality Date  . BARTHOLIN GLAND CYST EXCISION    . BREAST BIOPSY Left    neg  . COLONOSCOPY  2013   Dr. Allen Koch  . COLONOSCOPY WITH PROPOFOL N/A 10/23/2017   Procedure: COLONOSCOPY WITH PROPOFOL;  Surgeon: Deborah Lame, MD;  Location: [facility] ENDOSCOPY;  Service: Endoscopy;  Laterality: N/A;  . OOPHORECTOMY  2016  . OVARY SURGERY  2016   cyst on right ovary   Family History  Problem Relation Age of Onset  . Hypertension Mother   . Colon cancer Mother   . Anxiety  disorder Mother   . Depression Mother   . Heart failure Father   . Hypertension Father   . Depression Sister   . Anxiety disorder Sister   . Alcohol abuse Brother   . Schizophrenia Brother   . Breast cancer Maternal Aunt   . Heart attack Neg Hx   . Stroke Neg Hx     Allergies: Vicodin [hydrocodone-acetaminophen] Current Outpatient Medications on File Prior to Visit  Medication Sig Dispense Refill  . clonazePAM (KLONOPIN) 0.5 MG tablet TAKE 1 TABLET (0.5 MG TOTAL) BY MOUTH 2 (TWO) TIMES DAILY AS NEEDED. 60 tablet 1  . escitalopram (LEXAPRO) 10 MG tablet TAKE 1 TABLET BY MOUTH EVERY DAY 90 tablet 1  . lisinopril (ZESTRIL) 20 MG tablet TAKE 1 TABLET BY MOUTH EVERY DAY 90 tablet 0  . metoprolol succinate (TOPROL-XL) 100 MG 24 hr tablet TAKE 1 TABLET BY MOUTH DAILY. PLEASE CALL OFFICE FOR BP FOLLOW-UP APPT AS MENTIONED IN MAY. 90 tablet 3   No current facility-administered medications on file prior to visit.    Social History   Tobacco Use  . Smoking status: Current Every Day Smoker    Packs/day: 0.25    Years: 20.00    Pack years: 5.00    Types: Cigarettes  .  Smokeless tobacco: Never Used  Substance Use Topics  . Alcohol use: Yes    Alcohol/week: 12.0 standard drinks    Types: 10 Glasses of wine, 2 Cans of beer per week    Comment:    . Drug use: No    Review of Systems  Constitutional: Negative for chills and fever.  Respiratory: Negative for cough.   Cardiovascular: Negative for chest pain and palpitations.  Gastrointestinal: Negative for nausea and vomiting.  Psychiatric/Behavioral: Negative for sleep disturbance (controlled) and suicidal ideas. The patient is nervous/anxious.       Objective:    BP 130/80 (BP Location: Left Arm, Patient Position: Sitting)   Koch 73   Temp (!) 97 F (36.1 C)   Ht 5' 4.02" (1.626 m)   Wt 136 lb 6.4 oz (61.9 kg)   SpO2 99%   BMI 23.40 kg/m  BP Readings from Last 3 Encounters:  05/05/20 130/80  01/28/20 128/76  08/18/19  120/80   Wt Readings from Last 3 Encounters:  05/05/20 136 lb 6.4 oz (61.9 kg)  01/28/20 132 lb (59.9 kg)  08/18/19 130 lb (59 kg)    Physical Exam Vitals reviewed.  Constitutional:      Appearance: She is well-developed.  Eyes:     Conjunctiva/sclera: Conjunctivae normal.  Cardiovascular:     Rate and Rhythm: Normal rate and regular rhythm.     Pulses: Normal pulses.     Heart sounds: Normal heart sounds.  Pulmonary:     Effort: Pulmonary effort is normal.     Breath sounds: Normal breath sounds. No wheezing, rhonchi or rales.  Skin:    General: Skin is warm and dry.  Neurological:     Mental Status: She is alert.  Psychiatric:        Speech: Speech normal.        Behavior: Behavior normal.        Thought Content: Thought content normal.        Assessment & Plan:   Problem List Items Addressed This Visit      Cardiovascular and Mediastinum   HTN (hypertension)    Stable, continue regimen        Other   Anxiety and depression    Somewhat stable.  Stress of marriage is clearly weighing on patient.  She politely declines  changes to medication regimen or counseling at this time.  She will let me know if she is anything at all.      Fatigue     Of note, advised of the safely and efficacy of pfizer and moderna covid vaccines. She will let me know if has further questions. I am having Deborah Koch. Deborah Koch "Deborah Koch" maintain her metoprolol succinate, escitalopram, clonazePAM, and lisinopril.   No orders of the defined types were placed in this encounter.   Return precautions given.   Risks, benefits, and alternatives of the medications and treatment plan prescribed today were discussed, and patient expressed understanding.   Education regarding symptom management and diagnosis given to patient on AVS.  Continue to follow with Deborah Hawthorne, FNP for routine health maintenance.   Deborah Koch and I agreed with plan.   Deborah Paris, FNP

## 2020-05-09 DIAGNOSIS — Z23 Encounter for immunization: Secondary | ICD-10-CM | POA: Diagnosis not present

## 2020-05-09 DIAGNOSIS — L03115 Cellulitis of right lower limb: Secondary | ICD-10-CM | POA: Diagnosis not present

## 2020-05-09 DIAGNOSIS — S81831A Puncture wound without foreign body, right lower leg, initial encounter: Secondary | ICD-10-CM | POA: Diagnosis not present

## 2020-05-10 ENCOUNTER — Other Ambulatory Visit: Payer: Self-pay | Admitting: Family

## 2020-05-10 DIAGNOSIS — I700 Atherosclerosis of aorta: Secondary | ICD-10-CM

## 2020-05-10 MED ORDER — ROSUVASTATIN CALCIUM 10 MG PO TABS: 10.0000 mg | ORAL_TABLET | Freq: Every day | ORAL | 3 refills | Status: DC

## 2020-05-19 ENCOUNTER — Other Ambulatory Visit: Payer: Self-pay

## 2020-05-19 ENCOUNTER — Ambulatory Visit (INDEPENDENT_AMBULATORY_CARE_PROVIDER_SITE_OTHER): Payer: Medicare Other

## 2020-05-19 DIAGNOSIS — E538 Deficiency of other specified B group vitamins: Secondary | ICD-10-CM | POA: Diagnosis not present

## 2020-05-19 MED ORDER — CYANOCOBALAMIN 1000 MCG/ML IJ SOLN
1000.0000 ug | Freq: Once | INTRAMUSCULAR | Status: AC
  Administered 2020-05-19: 1000 ug via INTRAMUSCULAR

## 2020-05-19 NOTE — Progress Notes (Signed)
Patient presented for B 12 injection to left deltoid, patient voiced no concerns nor showed any signs of distress during injection. 

## 2020-05-26 ENCOUNTER — Ambulatory Visit (INDEPENDENT_AMBULATORY_CARE_PROVIDER_SITE_OTHER): Payer: Medicare Other

## 2020-05-26 ENCOUNTER — Other Ambulatory Visit: Payer: Self-pay

## 2020-05-26 DIAGNOSIS — E538 Deficiency of other specified B group vitamins: Secondary | ICD-10-CM | POA: Diagnosis not present

## 2020-05-26 MED ORDER — CYANOCOBALAMIN 1000 MCG/ML IJ SOLN
1000.0000 ug | Freq: Once | INTRAMUSCULAR | Status: AC
  Administered 2020-05-26: 1000 ug via INTRAMUSCULAR

## 2020-05-26 NOTE — Progress Notes (Signed)
Patient presented for B 12 injection to left deltoid, patient voiced no concerns nor showed any signs of distress during injection. 

## 2020-06-03 ENCOUNTER — Ambulatory Visit: Payer: Medicare Other

## 2020-06-08 ENCOUNTER — Other Ambulatory Visit: Payer: Self-pay | Admitting: Family

## 2020-06-08 DIAGNOSIS — F32A Depression, unspecified: Secondary | ICD-10-CM

## 2020-06-08 DIAGNOSIS — F419 Anxiety disorder, unspecified: Secondary | ICD-10-CM

## 2020-06-09 ENCOUNTER — Ambulatory Visit: Payer: Medicare Other

## 2020-06-09 NOTE — Telephone Encounter (Signed)
Refill request for klonopin, last seen 05-05-20, last filled 04-16-20.  Please advise.

## 2020-06-28 ENCOUNTER — Other Ambulatory Visit: Payer: Medicare Other

## 2020-07-01 ENCOUNTER — Other Ambulatory Visit: Payer: Self-pay | Admitting: Family

## 2020-07-01 DIAGNOSIS — F32A Depression, unspecified: Secondary | ICD-10-CM

## 2020-07-10 ENCOUNTER — Other Ambulatory Visit: Payer: Self-pay | Admitting: Family

## 2020-07-10 DIAGNOSIS — I10 Essential (primary) hypertension: Secondary | ICD-10-CM

## 2020-07-10 DIAGNOSIS — E871 Hypo-osmolality and hyponatremia: Secondary | ICD-10-CM

## 2020-07-19 ENCOUNTER — Telehealth: Payer: Self-pay | Admitting: Family

## 2020-07-19 NOTE — Telephone Encounter (Signed)
Left message for patient to call back and schedule Medicare Annual Wellness Visit (AWV)  ° °This should be a telephone visit only=30 minutes. ° °No hx of AWV; please schedule at anytime with Denisa O'Brien-Blaney at [place] ° ° °

## 2020-07-31 ENCOUNTER — Other Ambulatory Visit: Payer: Self-pay | Admitting: Family

## 2020-07-31 DIAGNOSIS — F32A Depression, unspecified: Secondary | ICD-10-CM

## 2020-07-31 DIAGNOSIS — F419 Anxiety disorder, unspecified: Secondary | ICD-10-CM

## 2020-07-31 DIAGNOSIS — F329 Major depressive disorder, single episode, unspecified: Secondary | ICD-10-CM

## 2020-08-03 NOTE — Telephone Encounter (Signed)
Call pt   I have refilled your klonopin  However I wanted to remind you that this is controlled substance.   In order for me to prescribe medication,  patients must be seen every 3 months.   Please make follow-up appointment this month for any further refills.    I looked up patient on [place] Controlled Substances Reporting System and saw no activity that raised concern of inappropriate use.   

## 2020-08-10 ENCOUNTER — Other Ambulatory Visit: Payer: Self-pay

## 2020-08-10 NOTE — Telephone Encounter (Signed)
Patient scheduled for med refill f/u 9/29.

## 2020-08-11 ENCOUNTER — Ambulatory Visit: Payer: Medicare Other | Admitting: Family

## 2020-09-15 ENCOUNTER — Other Ambulatory Visit: Payer: Self-pay

## 2020-09-15 ENCOUNTER — Telehealth: Payer: Self-pay | Admitting: Family

## 2020-09-15 ENCOUNTER — Telehealth (INDEPENDENT_AMBULATORY_CARE_PROVIDER_SITE_OTHER): Payer: Medicare Other | Admitting: Family

## 2020-09-15 ENCOUNTER — Encounter: Payer: Self-pay | Admitting: Family

## 2020-09-15 VITALS — Ht 64.0 in | Wt 130.0 lb

## 2020-09-15 DIAGNOSIS — Z1231 Encounter for screening mammogram for malignant neoplasm of breast: Secondary | ICD-10-CM

## 2020-09-15 DIAGNOSIS — F32A Depression, unspecified: Secondary | ICD-10-CM

## 2020-09-15 DIAGNOSIS — F17200 Nicotine dependence, unspecified, uncomplicated: Secondary | ICD-10-CM | POA: Diagnosis not present

## 2020-09-15 DIAGNOSIS — F419 Anxiety disorder, unspecified: Secondary | ICD-10-CM

## 2020-09-15 DIAGNOSIS — I10 Essential (primary) hypertension: Secondary | ICD-10-CM | POA: Diagnosis not present

## 2020-09-15 DIAGNOSIS — F329 Major depressive disorder, single episode, unspecified: Secondary | ICD-10-CM

## 2020-09-15 DIAGNOSIS — Z13820 Encounter for screening for osteoporosis: Secondary | ICD-10-CM

## 2020-09-15 DIAGNOSIS — E785 Hyperlipidemia, unspecified: Secondary | ICD-10-CM

## 2020-09-15 NOTE — Telephone Encounter (Signed)
lvm pt needs 3 month follow up

## 2020-09-15 NOTE — Assessment & Plan Note (Signed)
Elevated cardiovascular risk. Declines starting crestor at this time; will consider in the future

## 2020-09-15 NOTE — Patient Instructions (Addendum)
So nice to see you!  Please call  and schedule your 3D mammogram, bone density scan as discussed.   [place]  [place], [place]

## 2020-09-15 NOTE — Assessment & Plan Note (Addendum)
Controlled to improved.  Continue lexapro 10mg  and klonopin 0.5mg  prn

## 2020-09-15 NOTE — Progress Notes (Signed)
Virtual Visit via Video Note  I connected with@  on 09/15/20 at  4:00 PM EDT by a video enabled telemedicine application and verified that I am speaking with the correct person using two identifiers.  Location patient: home Location provider:work  Persons participating in the virtual visit: patient, provider  I discussed the limitations of evaluation and management by telemedicine and the availability of in person appointments. The patient expressed understanding and agreed to proceed.   HPI: Doing 'much better'. Has moved to [place] to be closer to children. Separated from husband.   Fatigue has improved. Did b12 injections twice and had nausea, HA. Doesn't want to do further B12. Taking orally b12 now.   HTN- compliant with lisinopril, toprol. No Cp, sob.   GAD and depression- improved. Compliant with lexapro and using klonopin 0.5mg  prn.   HLD- never started crestor. Would like to watch cholesterol.  Smoker ; doesn't meet criteria for CT chest.    ROS: See pertinent positives and negatives per HPI.    EXAM:  VITALS per patient if applicable: BP Readings from Last 3 Encounters:  05/05/20 130/80  01/28/20 128/76  08/18/19 120/80    GENERAL: alert, oriented, appears well and in no acute distress  HEENT: atraumatic, conjunttiva clear, no obvious abnormalities on inspection of external nose and ears  NECK: normal movements of the head and neck  LUNGS: on inspection no signs of respiratory distress, breathing rate appears normal, no obvious gross SOB, gasping or wheezing  CV: no obvious cyanosis  MS: moves all visible extremities without noticeable abnormality  PSYCH/NEURO: pleasant and cooperative, no obvious depression or anxiety, speech and thought processing grossly intact  ASSESSMENT AND PLAN:  Discussed the following assessment and plan:  Problem List Items Addressed This Visit      Cardiovascular and Mediastinum   HTN (hypertension)    Historically  controlled. Continue lisinopril 20mg  , toprol xl 100mg .         Other   Anxiety and depression    Controlled to improved.  Continue lexapro 10mg  and klonopin 0.5mg  prn      HLD (hyperlipidemia)    Elevated cardiovascular risk. Declines starting crestor at this time; will consider in the future       Other Visit Diagnoses    Screening for osteoporosis    -  Primary   Relevant Orders   DG Bone Density   Encounter for screening mammogram for malignant neoplasm of breast       Relevant Orders   MM 3D SCREEN BREAST BILATERAL   Smoker       Relevant Orders   US AORTA      -we discussed possible serious and likely etiologies, options for evaluation and workup, limitations of telemedicine visit vs in person visit, treatment, treatment risks and precautions. Pt prefers to treat via telemedicine empirically rather then risking or undertaking an in person visit at this moment.  .   I discussed the assessment and treatment plan with the patient. The patient was provided an opportunity to ask questions and all were answered. The patient agreed with the plan and demonstrated an understanding of the instructions.   The patient was advised to call back or seek an in-person evaluation if the symptoms worsen or if the condition fails to improve as anticipated.   Mable Paris, FNP

## 2020-09-15 NOTE — Assessment & Plan Note (Signed)
Historically controlled. Continue lisinopril 20mg  , toprol xl 100mg .

## 2020-09-20 ENCOUNTER — Telehealth: Payer: Self-pay | Admitting: Family

## 2020-09-20 NOTE — Telephone Encounter (Signed)
Patient moved and wanted to let the office know her new pharmacy is [facility], at [address] [place], [place] . Per patient she would like all other phrmacy's removed from her list.

## 2020-09-21 NOTE — Telephone Encounter (Signed)
It has been updated

## 2020-09-24 ENCOUNTER — Ambulatory Visit: Payer: Medicare Other

## 2020-09-27 ENCOUNTER — Encounter: Payer: Self-pay | Admitting: Family

## 2020-09-27 ENCOUNTER — Other Ambulatory Visit: Payer: Self-pay

## 2020-09-27 DIAGNOSIS — F32A Depression, unspecified: Secondary | ICD-10-CM

## 2020-09-27 DIAGNOSIS — F419 Anxiety disorder, unspecified: Secondary | ICD-10-CM

## 2020-09-29 MED ORDER — CLONAZEPAM 0.5 MG PO TABS: 0.5000 mg | ORAL_TABLET | Freq: Two times a day (BID) | ORAL | 1 refills | Status: DC | PRN

## 2020-09-29 NOTE — Telephone Encounter (Signed)
I looked up patient on  Controlled Substances Reporting System PMP AWARE and saw no activity that raised concern of inappropriate use.   

## 2020-09-30 NOTE — Telephone Encounter (Signed)
2nd attempt. Sent myChart message.

## 2020-10-17 ENCOUNTER — Other Ambulatory Visit: Payer: Self-pay | Admitting: Family

## 2020-10-17 DIAGNOSIS — E871 Hypo-osmolality and hyponatremia: Secondary | ICD-10-CM

## 2020-10-20 ENCOUNTER — Other Ambulatory Visit: Payer: Medicare Other

## 2020-10-21 ENCOUNTER — Other Ambulatory Visit: Payer: Medicare Other

## 2020-10-21 ENCOUNTER — Ambulatory Visit: Payer: Medicare Other

## 2020-11-21 ENCOUNTER — Encounter: Payer: Self-pay | Admitting: Family

## 2020-11-22 ENCOUNTER — Other Ambulatory Visit: Payer: Self-pay

## 2020-11-22 ENCOUNTER — Telehealth: Payer: Self-pay | Admitting: Family

## 2020-11-22 DIAGNOSIS — F419 Anxiety disorder, unspecified: Secondary | ICD-10-CM

## 2020-11-22 DIAGNOSIS — F32A Depression, unspecified: Secondary | ICD-10-CM

## 2020-11-22 DIAGNOSIS — E871 Hypo-osmolality and hyponatremia: Secondary | ICD-10-CM

## 2020-11-22 DIAGNOSIS — I10 Essential (primary) hypertension: Secondary | ICD-10-CM

## 2020-11-22 MED ORDER — ESCITALOPRAM OXALATE 10 MG PO TABS: 10.0000 mg | ORAL_TABLET | Freq: Every day | ORAL | 1 refills | Status: DC

## 2020-11-22 MED ORDER — METOPROLOL SUCCINATE ER 100 MG PO TB24: ORAL_TABLET | ORAL | 3 refills | Status: DC

## 2020-11-22 MED ORDER — CLONAZEPAM 0.5 MG PO TABS: 0.5000 mg | ORAL_TABLET | Freq: Two times a day (BID) | ORAL | 1 refills | Status: DC | PRN

## 2020-11-22 MED ORDER — LISINOPRIL 20 MG PO TABS: 20.0000 mg | ORAL_TABLET | Freq: Every day | ORAL | 1 refills | Status: DC

## 2020-11-22 NOTE — Telephone Encounter (Signed)
I looked up patient on [place] Controlled Substances Reporting System PMP AWARE and saw no activity that raised concern of inappropriate use.   

## 2021-01-11 ENCOUNTER — Encounter: Payer: Self-pay | Admitting: Family

## 2021-01-12 ENCOUNTER — Other Ambulatory Visit: Payer: Self-pay | Admitting: Family

## 2021-01-12 DIAGNOSIS — I10 Essential (primary) hypertension: Secondary | ICD-10-CM

## 2021-01-16 ENCOUNTER — Other Ambulatory Visit: Payer: Self-pay | Admitting: Family

## 2021-01-16 DIAGNOSIS — F419 Anxiety disorder, unspecified: Secondary | ICD-10-CM

## 2021-01-16 DIAGNOSIS — F32A Depression, unspecified: Secondary | ICD-10-CM

## 2021-02-08 ENCOUNTER — Encounter: Payer: Self-pay | Admitting: Family

## 2021-02-08 ENCOUNTER — Ambulatory Visit (INDEPENDENT_AMBULATORY_CARE_PROVIDER_SITE_OTHER): Payer: Medicare Other | Admitting: Family

## 2021-02-08 ENCOUNTER — Other Ambulatory Visit: Payer: Self-pay

## 2021-02-08 ENCOUNTER — Other Ambulatory Visit: Payer: Medicare Other

## 2021-02-08 VITALS — BP 122/84 | HR 60 | Temp 98.1°F | Resp 18 | Ht 62.5 in | Wt 131.4 lb

## 2021-02-08 DIAGNOSIS — I700 Atherosclerosis of aorta: Secondary | ICD-10-CM | POA: Diagnosis not present

## 2021-02-08 DIAGNOSIS — F419 Anxiety disorder, unspecified: Secondary | ICD-10-CM

## 2021-02-08 DIAGNOSIS — I10 Essential (primary) hypertension: Secondary | ICD-10-CM

## 2021-02-08 DIAGNOSIS — Z13820 Encounter for screening for osteoporosis: Secondary | ICD-10-CM

## 2021-02-08 DIAGNOSIS — F32A Depression, unspecified: Secondary | ICD-10-CM | POA: Diagnosis not present

## 2021-02-08 LAB — COMPREHENSIVE METABOLIC PANEL
ALT: 11 U/L (ref 0–35)
AST: 15 U/L (ref 0–37)
Albumin: 4.3 g/dL (ref 3.5–5.2)
Alkaline Phosphatase: 83 U/L (ref 39–117)
BUN: 9 mg/dL (ref 6–23)
CO2: 29 mEq/L (ref 19–32)
Calcium: 9.3 mg/dL (ref 8.4–10.5)
Chloride: 101 mEq/L (ref 96–112)
Creatinine, Ser: 0.69 mg/dL (ref 0.40–1.20)
GFR: 90.89 mL/min (ref >60.00)
Glucose, Bld: 88 mg/dL (ref 70–99)
Potassium: 4.7 mEq/L (ref 3.5–5.1)
Sodium: 136 mEq/L (ref 135–145)
Total Bilirubin: 0.6 mg/dL (ref 0.2–1.2)
Total Protein: 6.9 g/dL (ref 6.0–8.3)

## 2021-02-08 LAB — CBC WITH DIFFERENTIAL/PLATELET
Basophils Absolute: 0.1 10*3/uL (ref 0.0–0.1)
Basophils Relative: 1.1 % (ref 0.0–3.0)
Eosinophils Absolute: 0.2 10*3/uL (ref 0.0–0.7)
Eosinophils Relative: 3.6 % (ref 0.0–5.0)
HCT: 36.7 % (ref 36.0–46.0)
Hemoglobin: 12.6 g/dL (ref 12.0–15.0)
Lymphocytes Relative: 35 % (ref 12.0–46.0)
Lymphs Abs: 1.9 10*3/uL (ref 0.7–4.0)
MCHC: 34.2 g/dL (ref 30.0–36.0)
MCV: 95.7 fl (ref 78.0–100.0)
Monocytes Absolute: 0.5 10*3/uL (ref 0.1–1.0)
Monocytes Relative: 9.5 % (ref 3.0–12.0)
Neutro Abs: 2.7 10*3/uL (ref 1.4–7.7)
Neutrophils Relative %: 50.8 % (ref 43.0–77.0)
Platelets: 314 10*3/uL (ref 150.0–400.0)
RBC: 3.83 Mil/uL — ABNORMAL LOW (ref 3.87–5.11)
RDW: 13.5 % (ref 11.5–15.5)
WBC: 5.4 10*3/uL (ref 4.0–10.5)

## 2021-02-08 LAB — LIPID PANEL
Cholesterol: 187 mg/dL (ref 0–200)
HDL: 93.5 mg/dL (ref >39.00)
LDL Cholesterol: 82 mg/dL (ref 0–99)
NonHDL: 93.4
Total CHOL/HDL Ratio: 2
Triglycerides: 57 mg/dL (ref 0.0–149.0)
VLDL: 11.4 mg/dL (ref 0.0–40.0)

## 2021-02-08 NOTE — Patient Instructions (Addendum)
Please call  and schedule your 3D mammogram, bone density scan as discussed.   [place]  [place], [place]  Nice to see you!

## 2021-02-08 NOTE — Progress Notes (Signed)
Subjective:    Patient ID: Deborah Koch, female    DOB: [birth year], 65 y.o.   MRN: [id number]  CC: Deborah Koch is a 65 y.o. female who presents today for follow up.   HPI: Feels well today No complaints.  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   HLD/atheroscleroris- She is not taking statin medication   HTN- compliant with lisinopril, toprol. No CP.   GAD- feels well on lexapro, klonopin 0.5mg  BID. Sleeping well.   No si/hi.   Would like to discontinue pap smears. No h/o GYN cancer or abnormal pap smear. No pelvic pain, vaginal pain.     Due mammogram, dexa  HISTORY:  Past Medical History:  Diagnosis Date  . Anxiety   . C. difficile colitis    a. 07/2014.  [name] Congenital bicuspid aortic valve   . Depression   . Dilated Ascending Aorta    a. 12/2013 Echo: 4.45cm.  . Diverticulitis    hospitalized; subsequent c diff infection.   [name] GERD (gastroesophageal reflux disease)   . H/O mastitis 2015  . Heart murmur   . History of tobacco abuse    a. 20 yrs, 1/4 ppd - quit.  [name] HTN (hypertension)   . Hypokalemia   . Moderate to severe aortic stenosis    a. 12/2013 Echo: EF 60-65%, Grade 1 DD, bicuspid AoV with mod-sev AS [60mmHg mean gradient, AoV area 0.69cm^2 (VTI)], mod dil ascending Ao - 4.45cm, mild TR, PASP 82mmHg.  . Multilevel degenerative disc disease   . Scoliosis    Past Surgical History:  Procedure Laterality Date  . BARTHOLIN GLAND CYST EXCISION    . BREAST BIOPSY Left    neg  . COLONOSCOPY  2013   Dr. Allen Norris  . COLONOSCOPY WITH PROPOFOL N/A 10/23/2017   Procedure: COLONOSCOPY WITH PROPOFOL;  Surgeon: Lucilla Lame, MD;  Location: [facility] ENDOSCOPY;  Service: Endoscopy;  Laterality: N/A;  . OOPHORECTOMY  2016  . OVARY SURGERY  2016   cyst on right ovary   Family History  Problem Relation Age of Onset  . Hypertension Mother   . Colon cancer Mother   . Anxiety disorder Mother   . Depression Mother   . Heart  failure Father   . Hypertension Father   . Depression Sister   . Anxiety disorder Sister   . Alcohol abuse Brother   . Schizophrenia Brother   . Breast cancer Maternal Aunt   . Heart attack Neg Hx   . Stroke Neg Hx     Allergies: Vicodin [hydrocodone-acetaminophen] Current Outpatient Medications on File Prior to Visit  Medication Sig Dispense Refill  . clonazePAM (KLONOPIN) 0.5 MG tablet TAKE 1 TABLET BY MOUTH 2 TIMES DAILY AS NEEDED. 60 tablet 1  . escitalopram (LEXAPRO) 10 MG tablet Take 1 tablet (10 mg total) by mouth daily. 90 tablet 1  . lisinopril (ZESTRIL) 20 MG tablet Take 1 tablet (20 mg total) by mouth daily. 90 tablet 1  . metoprolol succinate (TOPROL-XL) 100 MG 24 hr tablet TAKE 1 TABLET BY MOUTH DAILY. PLEASE CALL OFFICE FOR BP FOLLOW-UP APPT AS MENTIONED IN MAY. 90 tablet 3   No current facility-administered medications on file prior to visit.    Social History   Tobacco Use  . Smoking status: Light Tobacco Smoker    Packs/day: 0.25    Years: 20.00    Pack years: 5.00    Types: Cigarettes  . Smokeless tobacco: Never Used  Vaping Use  .  Vaping Use: Never used  Substance Use Topics  . Alcohol use: Yes    Alcohol/week: 12.0 standard drinks    Types: 10 Glasses of wine, 2 Cans of beer per week    Comment:    . Drug use: No    Review of Systems  Constitutional: Negative for chills and fever.  Respiratory: Negative for cough.   Cardiovascular: Negative for chest pain and palpitations.  Gastrointestinal: Negative for nausea and vomiting.  Psychiatric/Behavioral: Negative for sleep disturbance and suicidal ideas. The patient is not nervous/anxious (controlled).       Objective:    BP 122/84   Pulse 60   Temp 98.1 F (36.7 C)   Resp 18   Ht 5' 2.5" (1.588 m)   Wt 131 lb 6 oz (59.6 kg)   SpO2 98%   BMI 23.65 kg/m  BP Readings from Last 3 Encounters:  02/08/21 122/84  05/05/20 130/80  01/28/20 128/76   Wt Readings from Last 3 Encounters:  02/08/21  131 lb 6 oz (59.6 kg)  09/15/20 130 lb (59 kg)  05/05/20 136 lb 6.4 oz (61.9 kg)    Physical Exam Vitals reviewed.  Constitutional:      Appearance: She is well-developed and well-nourished.  Eyes:     Conjunctiva/sclera: Conjunctivae normal.  Cardiovascular:     Rate and Rhythm: Normal rate and regular rhythm.     Pulses: Normal pulses.     Heart sounds: Normal heart sounds.  Pulmonary:     Effort: Pulmonary effort is normal.     Breath sounds: Normal breath sounds. No wheezing, rhonchi or rales.  Skin:    General: Skin is warm and dry.  Neurological:     Mental Status: She is alert.  Psychiatric:        Mood and Affect: Mood and affect normal.        Speech: Speech normal.        Behavior: Behavior normal.        Thought Content: Thought content normal.        Assessment & Plan:   Problem List Items Addressed This Visit      Cardiovascular and Mediastinum   Atherosclerosis of aorta (HCC)    Declines statin medication. Advised to seek advice from Dr Rockey Situ in regards to CVD risk at follow up with  Him.      HTN (hypertension)    Controlled. Continue lisinopril 20mg , toprol 100mg         Other   Anxiety and depression    Well controlled. Continue lexapro 10mg , klonopin 0.5mg  BID.       Other Visit Diagnoses    Screening for osteoporosis    -  Primary   Relevant Orders   MM 3D SCREEN BREAST BILATERAL   DG Bone Density     Due pap smear. Of note Offered to repeat pap smear at age 36yrs and she declined . She declines further cervical cancer screening  I am having [name]. Marzetta Board "[name]" maintain her escitalopram, lisinopril, metoprolol succinate, and clonazePAM.   No orders of the defined types were placed in this encounter.   Return precautions given.   Risks, benefits, and alternatives of the medications and treatment plan prescribed today were discussed, and patient expressed understanding.   Education regarding symptom management and diagnosis  given to patient on AVS.  Continue to follow with Burnard Hawthorne, FNP for routine health maintenance.   [name] and I agreed with plan.  Mable Paris, FNP

## 2021-02-08 NOTE — Assessment & Plan Note (Signed)
Controlled. Continue lisinopril 20mg , toprol 100mg 

## 2021-02-08 NOTE — Assessment & Plan Note (Signed)
Declines statin medication. Advised to seek advice from Dr Rockey Situ in regards to CVD risk at follow up with  Him.

## 2021-02-08 NOTE — Assessment & Plan Note (Signed)
Well controlled. Continue lexapro 10mg , klonopin 0.5mg  BID.

## 2021-02-09 ENCOUNTER — Encounter: Payer: Self-pay | Admitting: Family

## 2021-02-11 ENCOUNTER — Other Ambulatory Visit: Payer: Self-pay | Admitting: Cardiovascular Disease

## 2021-02-11 DIAGNOSIS — I359 Nonrheumatic aortic valve disorder, unspecified: Secondary | ICD-10-CM

## 2021-02-15 ENCOUNTER — Other Ambulatory Visit: Payer: Medicare Other

## 2021-03-07 ENCOUNTER — Encounter: Payer: Self-pay | Admitting: Family

## 2021-03-08 ENCOUNTER — Ambulatory Visit (INDEPENDENT_AMBULATORY_CARE_PROVIDER_SITE_OTHER): Payer: Medicare Other

## 2021-03-08 ENCOUNTER — Other Ambulatory Visit: Payer: Self-pay

## 2021-03-08 DIAGNOSIS — Z1231 Encounter for screening mammogram for malignant neoplasm of breast: Secondary | ICD-10-CM

## 2021-03-08 DIAGNOSIS — I359 Nonrheumatic aortic valve disorder, unspecified: Secondary | ICD-10-CM

## 2021-03-08 DIAGNOSIS — Z13820 Encounter for screening for osteoporosis: Secondary | ICD-10-CM

## 2021-03-08 LAB — ECHOCARDIOGRAM COMPLETE
AR max vel: 0.47 cm2
AV Area VTI: 0.5 cm2
AV Area mean vel: 0.43 cm2
AV Mean grad: 45 mmHg
AV Peak grad: 78.1 mmHg
Ao pk vel: 4.42 m/s
Area-P 1/2: 2.41 cm2
P 1/2 time: 726 msec
S' Lateral: 2.6 cm

## 2021-03-09 ENCOUNTER — Other Ambulatory Visit: Payer: Self-pay | Admitting: Family

## 2021-03-09 DIAGNOSIS — F419 Anxiety disorder, unspecified: Secondary | ICD-10-CM

## 2021-03-09 DIAGNOSIS — F32A Depression, unspecified: Secondary | ICD-10-CM

## 2021-03-11 ENCOUNTER — Telehealth: Payer: Self-pay | Admitting: Family

## 2021-03-11 NOTE — Telephone Encounter (Signed)
Call pt   I have refilled your klonopin  However I wanted to remind you that this is controlled substance.   In order for me to prescribe medication,  patients must be seen every 3 months. Due in June  Please make follow-up appointment this month for any further refills.    I looked up patient on [place] Controlled Substances Reporting System and saw no activity that raised concern of inappropriate use.

## 2021-03-14 ENCOUNTER — Ambulatory Visit: Payer: Medicare Other | Admitting: Cardiovascular Disease

## 2021-03-14 NOTE — Telephone Encounter (Signed)
LMTCB to get patient scheduled for f/u appointment so she may continue to receive Klonopin.

## 2021-03-15 ENCOUNTER — Telehealth: Payer: Self-pay | Admitting: Cardiovascular Disease

## 2021-03-15 NOTE — Telephone Encounter (Signed)
Able to reach pt regarding her recent echo, Dr. Rockey Situ had a chance to review her results and advised   "Normal LV function  Aortic valve stenosis now progressed to severe  We can discuss first options on her visit in early May  For any symptoms shortness of breath, chest tightness, dizziness , we would need to move the appointment up"   Mrs. Spegal is pleased with results, stated "this eases my mind". Reports she has been feeling well, went to beach last weekend, walked up 3 flights of stairs and did not experience any CP or SOB.  Appt on 4/5 with Mickle Plumb, PA-C, will keep appt to see of anythign further recommendations regarding the increased stenosis, otherwise all questions or concerns were address and no additional concerns at this time.

## 2021-03-15 NOTE — Telephone Encounter (Signed)
-----   Message from Wynema Birch, RN sent at 03/15/2021  9:40 AM EDT ----- 10 am appt? Can call since there was an cancellation [facility]

## 2021-03-15 NOTE — Telephone Encounter (Signed)
Unable to make it today for gollan .  Scheduled visser 4/5.    Patient would like a call back to briefly discuss echo results while waiting on appt.

## 2021-03-21 ENCOUNTER — Ambulatory Visit: Payer: Medicare Other | Admitting: Physician Assistant

## 2021-03-22 ENCOUNTER — Other Ambulatory Visit: Payer: Medicare Other

## 2021-03-22 ENCOUNTER — Other Ambulatory Visit
Admission: RE | Admit: 2021-03-22 | Discharge: 2021-03-22 | Disposition: A | Discharge status: Home / Self Care | Payer: Medicare Other | Source: Ambulatory Visit | Attending: Physician Assistant | Admitting: Physician Assistant

## 2021-03-22 ENCOUNTER — Encounter: Payer: Self-pay | Admitting: Physician Assistant

## 2021-03-22 ENCOUNTER — Ambulatory Visit: Payer: Medicare Other | Admitting: Physician Assistant

## 2021-03-22 ENCOUNTER — Other Ambulatory Visit: Payer: Self-pay

## 2021-03-22 VITALS — BP 158/98 | HR 60 | Ht 62.0 in | Wt 131.0 lb

## 2021-03-22 DIAGNOSIS — Q231 Congenital insufficiency of aortic valve: Secondary | ICD-10-CM

## 2021-03-22 DIAGNOSIS — Z01812 Encounter for preprocedural laboratory examination: Secondary | ICD-10-CM | POA: Diagnosis not present

## 2021-03-22 DIAGNOSIS — E785 Hyperlipidemia, unspecified: Secondary | ICD-10-CM

## 2021-03-22 DIAGNOSIS — R5383 Other fatigue: Secondary | ICD-10-CM | POA: Diagnosis not present

## 2021-03-22 DIAGNOSIS — I700 Atherosclerosis of aorta: Secondary | ICD-10-CM | POA: Diagnosis not present

## 2021-03-22 DIAGNOSIS — I77810 Thoracic aortic ectasia: Secondary | ICD-10-CM | POA: Diagnosis not present

## 2021-03-22 DIAGNOSIS — Z20822 Contact with and (suspected) exposure to covid-19: Secondary | ICD-10-CM | POA: Diagnosis not present

## 2021-03-22 DIAGNOSIS — Q2381 Bicuspid aortic valve: Secondary | ICD-10-CM

## 2021-03-22 DIAGNOSIS — I350 Nonrheumatic aortic (valve) stenosis: Secondary | ICD-10-CM

## 2021-03-22 DIAGNOSIS — I10 Essential (primary) hypertension: Secondary | ICD-10-CM | POA: Diagnosis not present

## 2021-03-22 LAB — SARS CORONAVIRUS 2 (TAT 6-24 HRS): SARS Coronavirus 2: NEGATIVE

## 2021-03-22 MED ORDER — ROSUVASTATIN CALCIUM 5 MG PO TABS: 5.0000 mg | ORAL_TABLET | Freq: Every day | ORAL | 5 refills | Status: DC

## 2021-03-22 NOTE — Patient Instructions (Addendum)
Medication Instructions:  Your physician has recommended you make the following change in your medication:   START Crestor (Atorvastatin) 5 mg daily. An Rx has been sent to your pharmacy.   *If you need a refill on your cardiac medications before your next appointment, please call your pharmacy*   Lab Work: Bmp and Cbc today.  You will need a COVID test today. Once you are back on the 1st floor stop at the pre-admission testing office to get your test   Your physician recommends that you return for a FASTING lipid profile and hepatic in 6-8 weeks. Please have your lab drawn at the [facility] medical mall. You do not need an appt. Lab hours are Mon-Fri 7am-6pm.  If you have labs (blood work) drawn today and your tests are completely normal, you will receive your results only by: [name] MyChart Message (if you have MyChart) OR . A paper copy in the mail If you have any lab test that is abnormal or we need to change your treatment, we will call you to review the results.   Testing/Procedures: Your physician has requested that you have a cardiac catheterization. Cardiac catheterization is used to diagnose and/or treat various heart conditions. Doctors may recommend this procedure for a number of different reasons. The most common reason is to evaluate chest pain. Chest pain can be a symptom of coronary artery disease (CAD), and cardiac catheterization can show whether plaque is narrowing or blocking your heart's arteries. This procedure is also used to evaluate the valves, as well as measure the blood flow and oxygen levels in different parts of your heart. For further information please visit [email or web address]. Please follow instruction sheet, as given.     Follow-Up: At [facility], you and your health needs are our priority.  As part of our continuing mission to provide you with exceptional heart care, we have created designated Provider Care Teams.  These Care Teams include your primary  Cardiologist (physician) and Advanced Practice Providers (APPs -  Physician Assistants and Nurse Practitioners) who all work together to provide you with the care you need, when you need it.  We recommend signing up for the patient portal called "MyChart".  Sign up information is provided on this After Visit Summary.  MyChart is used to connect with patients for Virtual Visits (Telemedicine).  Patients are able to view lab/test results, encounter notes, upcoming appointments, etc.  Non-urgent messages can be sent to your provider as well.   To learn more about what you can do with MyChart, go to [email or web address].    Your next appointment:   2 week(s)  The format for your next appointment:   In Person  Provider:   You may see Ida Rogue, MD or one of the following Advanced Practice Providers on your designated Care Team:    Murray Hodgkins, NP  Christell Faith, PA-C  Marrianne Mood, PA-C  Cadence Kathlen Mody, [place]  Laurann Montana, NP    Other Instructions [facility] Cardiac Cath Instructions   You are scheduled for a Cardiac Cath [id number] 03/24/21 with Dr. Rockey Situ  Please arrive at 7:30 am on the day of your procedure  Please expect a call from our [place] to pre-register you  Do not eat/drink anything after midnight  Someone will need to drive you home  It is recommended someone be with you for the first 24 hours after your procedure  Wear clothes that are easy to get on/off and wear slip on shoes if possible  Medications bring a current list of all medications with you  _X__ You may take all of your medications the morning of your procedure with enough water to swallow safely    Day of your procedure: Arrive at the [place] entrance.  Free valet service is available.  After entering the [facility] please check-in at the registration desk (1st desk on your right) to receive your armband. After receiving your armband someone will escort you  to the cardiac cath/special procedures waiting area.  The usual length of stay after your procedure is about 2 to 3 hours.  This can vary.  If you have any questions, please call our office at [phone], or you may call the cardiac cath lab at [facility] directly at [place]- TEST: You will need a COVID TEST prior to the procedure:  LOCATION: [facility] [address] Testing site.  DATE/TIME:  4//4/22   Blood pressure: --We recommend upper arm BP cuffs over that of the wrist. --You can always check your device's accuracy against an office model once a year if possible. You can do so by bringing your cuff into the office and notifying the person that rooms you that you would like to check your cuff against our office readings. --Measure your BP at the same time each day. Blood pressure varies often throughout the day with higher readings in the morning. BP may also be lower at home than in the office. Do not measure BP right after you wake up or after exercising. Avoid caffeine, tobacco, and alcohol for 30 minutes before taking a measurement.  --Sit quietly for five minutes in a comfortable position with your legs and ankles uncrossed and back supported. Feet flat on the ground. Have your arm supported and at the level of your heart. Always use the same arm when taking your blood pressure. Place the cuff over bare skin rather than clothing. Each time you measure, take an additional reading if abnormal to ensure accurate by waiting 1-3 minutes after the first reading. --Please bring a BP log into the office. It is helpful to document the time of each BP reading, as well as any activity or medications taken around the reading. In addition, it is helpful to include heart rate at the time of the BP reading. Daily weights are also encouraged. --Goal BP is 130/80 or lower. If your BP is low but you are not dizzy, this is fine and preferred over BP higher than  130/80. If your BP is less than 100 for the top number and you are dizzy, call the office. If your blood pressure is consistently elevated with top number above 180 and bottom above 120, this can damage the body. If you have severe increase in your blood pressure or concerning symptoms of severe chest pain, headache with confusion and blurred vision, severe abdominal or back pain, shortness of breath, seizures, or loss of consciousness, go to the emergency department.   We recommend a maximum of 2g sodium per day and 2L total fluid per day. Fluids include coffee, tea, water, and juice.  In addition, we recommend you monitor both your daily weight and daily BP at the same time each day - bring this long into the office.   Goal BP 130/80 or lower.   * Will provide you with an app for your daughter to help explain transaortic valve replacement versus surgical valve replacement.  Bicuspid Valve: Your echo shows an  aortic bicuspid valve. Normally, the aortic valve has three leaflets. Your bicuspid valve has only two leaflets. 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  Over time, we've learned that those with bicuspid valves are also prone to have weakened walls of their aorta; therefore, if blood pressure is high, over time, this weak tissue may dilate or tear with higher pressures. To prevent this, we ensure good blood pressure control and healthy lifestyle. Research has also shown antibiotics in the fluoroquinolone class  are associated with an increased risk of developing an aneurysm or having it develop a tear. Therefore, we advise you to stay away from those antibiotics if at all possible, given you will want to avoid any further increased risk of weakening those walls. Antibiotics in this class end in "floxacin," like Cipro or Levaquin. Make sure to discuss this information with other healthcare providers if you require antibiotics. Avoid heavy lifting as well.    We monitor  with serial EKGs, echocardiograms, and stress tests. Another finding in those with bicuspid valves is that the aortic valve itself becomes leaky or stiff easier than that of three leaflet valves (due to increased wear and tear). This puts further stress on your heart and vessels, which can lead to further vessel / heart dilation and also thickening of that muscle in the bottom left chamber of your heart (as with elevated blood pressure). So, we will control your blood pressure and your cholesterol tightly.   If the bicuspid valve becomes stiff or leaky due to wear and tear, this can cause symptoms such as chest pain and shortness of breath with exertion. Other symptoms are associated with a dissection. Contact a health care provider if you develop any discomfort in your upper back, neck, abdomen, trouble swallowing, cough or hoarseness, or unexplained weight loss. Get help right away if you develop severe pain in your upper back or abdomen that may move into your chest and arms, or any other concerning symptoms such as shortness of breath or fever. These are signs of a dissection.   [email or web address]      Aortic Valve Stenosis  Aortic valve stenosis is a narrowing of the aortic valve in the heart. The aortic valve opens and closes to regulate blood flow between the left side of the heart (left ventricle) and the artery that leads away from the heart (aorta). When the aortic valve becomes narrow, it is difficult for the heart to pump blood out to the body, which causes the heart to work harder. The extra work can weaken the heart muscle over time. Aortic valve stenosis can range from mild to severe. If it is not treated, it can become more severe over time and lead to heart failure. What are the causes? This condition may be caused by:  Buildup of calcium around and on the aortic valve. This can occur with aging. This is the most common  cause of aortic valve stenosis.  A heart problem that developed in the womb (birth defect).  Rheumatic fever.  Radiation to the chest. What increases the risk? You may be more likely to develop this condition if:  You are older than age 13.  You were born with an abnormal bicuspid valve. What are the signs or symptoms? You may not have any symptoms until your condition becomes severe. It may take 10-20 years for mild or moderate aortic valve stenosis to become severe. Symptoms may include:  Shortness of breath. This may get worse  during physical activity.  Feeling unusually weak and tired (fatigue).  Extreme discomfort in the chest, neck, or arm during physical activity (angina).  A heartbeat that is irregular or faster than normal (palpitations).  Dizziness or fainting. This may happen when you get physically tired or after you take certain heart medicines, such as nitroglycerin. How is this diagnosed? This condition may be diagnosed with:  A physical exam.  Echocardiogram. This is a type of imaging test that uses sound waves (ultrasound) to make images of your heart. There are two kinds of this test that may be used. ? Transthoracic echocardiogram (TTE). For this type, a wand-like tool (transducer) is moved over your chest to create ultrasound images that are recorded by a computer. ? Transesophageal echocardiogram (TEE). For this type, a flexible tube (probe) is inserted down the part of the body that moves food from your mouth to your stomach (esophagus). The heart and the esophagus are close to each other. Your health care provider will use the probe to take clear, detailed pictures of the heart.  Cardiac catheterization. For this procedure, a small, thin tube (catheter) is passed through a large vein in your neck, groin, or arm. The catheter is used to get information about arteries, structures, blood pressure, and oxygen levels in your heart.  Stress tests. These are tests  that evaluate the blood supply to your heart and your heart's response to exercise. You may work with a health care provider who specializes in the heart (cardiologist) for diagnosis and treatment. How is this treated? Treatment depends on how severe your condition is and what your symptoms are. You will need to have your heart checked regularly to make sure that your condition is not getting worse or causing serious problems. Treatment may also include:  Surgery to replace your aortic valve. This is the most common treatment for aortic valve stenosis, and it is the only treatment to cure the condition. Several types of surgeries are available. The surgery may be done: ? Through a large incision over your heart (open-heart surgery). ? Through small incisions, using a flexible tube called a catheter (transcatheter aortic valve replacement, TAVR).  Medicines that help to keep your heart rate regular.  Medicines that thin your blood (anticoagulants) to prevent blood clots.  Antibiotic medicines to help prevent infection. If your condition is mild, you may only need regular follow-up visits for monitoring. Follow these instructions at home: Lifestyle  Limit alcohol intake to no more than 1 drink a day for nonpregnant women and 2 drinks a day for men. One drink equals 12 oz of beer, 5 oz of wine, or 1 oz of hard liquor.  Do not use any products that contain nicotine or tobacco, such as cigarettes and e-cigarettes. If you need help quitting, ask your health care provider.  Work with your health care provider to manage your blood pressure and cholesterol.  Maintain a healthy weight. Eating and drinking  Eat a heart-healthy diet that includes plenty of fresh fruits and vegetables, whole grains, lean protein, and low-fat or nonfat dairy.  Limit how much caffeine you drink. Caffeine can affect your heart's rate and rhythm.  Avoid foods that are: ? High in salt (sodium), saturated fat, or  sugar. ? Canned or highly processed. ? Fried.  Follow instructions from your health care provider about any other eating or drinking restrictions.   Activity  Exercise regularly and return to your normal activities as told by your health care provider. Ask your  health care provider what amount and type of physical activity is safe for you. ? If your aortic valve stenosis is mild, you may only need to avoid very intense physical activity, such as heavy weight lifting. ? The more severe your aortic valve stenosis is, the more activities you may need to avoid. If you are taking blood thinners:  Before you take any medicines that contain aspirin or NSAIDs, talk with your health care provider. These medicines increase your risk for dangerous bleeding.  Take your medicine exactly as told, at the same time every day.  Avoid activities that could cause injury or bruising, and follow instructions about how to prevent falls.  Wear a medical alert bracelet or carry a card that lists what medicines you take. General instructions  Take over-the-counter and prescription medicines only as told by your health care provider.  If you were prescribed an antibiotic, take it as told by your health care provider. Do not stop taking the antibiotic even if you start to feel better.  If you are a woman and you plan to become pregnant, talk with your health care provider before you become pregnant.  Before you have any type of medical or dental procedure or surgery, tell all health care providers that you have aortic valve stenosis. This may affect the treatment that you receive.  Keep all follow-up visits as told by your health care provider. This is important. Contact a health care provider if:  You have a fever. Get help right away if:  You develop any of the following symptoms: ? Chest pain. ? Chest tightness. ? Shortness of breath. ? Trouble breathing.  You feel light-headed.  You feel like you  might faint.  Your heartbeat is irregular or faster than normal. These symptoms may represent a serious problem that is an emergency. Do not wait to see if the symptoms will go away. Get medical help right away. Call your local emergency services (911 in the [place] Do not drive yourself to the hospital. Summary  Aortic valve stenosis is a narrowing of the aortic valve in the heart. The aortic valve opens and closes to regulate blood flow between the left side of the heart (left ventricle) and the artery that leads away from the heart (aorta).  Aortic valve stenosis can range from mild to severe. If it is not treated, it can become more severe over time and lead to heart failure.  Treatment depends on how severe your condition is and what your symptoms are. You will need to have your heart checked regularly to make sure that your condition is not getting worse or causing serious problems.  Exercise regularly and return to your normal activities as told by your health care provider. Ask your health care provider what amount and type of physical activity is safe for you. This information is not intended to replace advice given to you by your health care provider. Make sure you discuss any questions you have with your health care provider. Document Revised: 05/19/2020 Document Reviewed: 05/19/2020 Elsevier Patient Education  2021 [employer].

## 2021-03-22 NOTE — Progress Notes (Signed)
ldl   Office Visit    Patient Name: Deborah Koch Date of Encounter: 03/22/2021  PCP:  Burnard Hawthorne, Alpha  Cardiologist:  Ida Rogue, MD  Advanced Practice Provider:  No care team member to display Electrophysiologist:  None   Chief Complaint    Chief Complaint  Patient presents with  . Follow-up    F/U after echo-would like to discuss whether starting on rosuvastatin would be helpful in treating her cardiac condition    65 year old female with history of bicuspid aortic valve, moderate aortic valve stenosis, mild to moderate TR, 4.1 cm ascending aortic dilation (2015), hypertension, GERD, scoliosis, prior history of tobacco use, anxiety, and who presents today for follow-up of echo with severe aortic stenosis.  Past Medical History    Past Medical History:  Diagnosis Date  . Anxiety   . C. difficile colitis    a. 07/2014.  [name] Congenital bicuspid aortic valve   . Depression   . Dilated Ascending Aorta    a. 12/2013 Echo: 4.45cm.  . Diverticulitis    hospitalized; subsequent c diff infection.   [name] GERD (gastroesophageal reflux disease)   . H/O mastitis 2015  . Heart murmur   . History of tobacco abuse    a. 20 yrs, 1/4 ppd - quit.  [name] HTN (hypertension)   . Hypokalemia   . Moderate to severe aortic stenosis    a. 12/2013 Echo: EF 60-65%, Grade 1 DD, bicuspid AoV with mod-sev AS [7mmHg mean gradient, AoV area 0.69cm^2 (VTI)], mod dil ascending Ao - 4.45cm, mild TR, PASP 83mmHg.  . Multilevel degenerative disc disease   . Scoliosis    Past Surgical History:  Procedure Laterality Date  . BARTHOLIN GLAND CYST EXCISION    . BREAST BIOPSY Left    neg  . COLONOSCOPY  2013   Dr. Allen Norris  . COLONOSCOPY WITH PROPOFOL N/A 10/23/2017   Procedure: COLONOSCOPY WITH PROPOFOL;  Surgeon: Lucilla Lame, MD;  Location: [facility] ENDOSCOPY;  Service: Endoscopy;  Laterality: N/A;  . OOPHORECTOMY  2016  . OVARY SURGERY  2016   cyst on right  ovary    Allergies  Allergies  Allergen Reactions  . Vicodin [Hydrocodone-Acetaminophen] Hives and Rash    History of Present Illness    Deborah Koch is a 65 y.o. female with PMH as above.   07/2019 echo with EF 60 to 43%, NR WMA, diastolic dysfunction.  RVSP 38.2 mmHg.  Mild LAE, mild to moderate TR, bicuspid aortic valve with moderate thickening of the valve and moderate to severe aortic valve stenosis.  Dilation of aortic root 3.2 cm and ascending aorta 4.5 cm.  She was last seen by her primary cardiologist 08/18/2019 via virtual visit.  At that time, she reported anxiety.  She was taking care of her 106 year old mother with Alzheimer's, who was in hospice.  She reported chest pain from stress that improved with Klonopin.  She denied shortness of breath or anginal symptoms with activity.  She reported BP well controlled.  A long discussion took place regarding her aortic valve stenosis.  She denied significant symptoms.  It was noted that it was unclear if she would be a good TAVR candidate, given her ascending dilated aorta and bicuspid aortic valve.  She was interested in meeting with CT surgery at a later date.  Echocardiogram scheduled and completed 02/2021 showing severe aortic stenosis.  Today, 03/22/2021, she returns to clinic and notes significant fatigue.  She  is also very anxious with BP noted to be elevated at 158/98.  She is very concerned regarding her echo results, especially as she was told that her aortic stenosis is severe.  She reports that someone told her she should be very concerned about her diagnosis, which understandably caused a significant amount of anxiety.  Long discussion regarding bicuspid valve/aortic stenosis/and the options for valvular replacement.  Options for work-up reviewed and as below.  Fortunately, she remains relatively asymptomatic.  She has noted increased fatigue lately, though she notes an increased amount of stress since her previous visit.  Her  mother unfortunately passed away 2 years ago.  In addition, she is currently getting a divorce and has moved to [place].  She reports an understandable amount of stress surrounding these changes.  She denies any chest pain, shortness of breath, dyspnea, presyncope, syncope, racing heart rate, palpitations, lower extremity edema, abdominal distention, orthopnea, PND, early satiety, or weight gain.  She reports that she is walking for exercise at 1/2 mile every day and denies any exertional symptoms of chest pain or shortness of breath.  The other day she called luggage up to the third floor of a building without any problems or exertional symptoms.  She reports a healthy diet.  When anxiety is not a factor, she states her BP is normally 1 94-8 20 systolic and 70 to 01K diastolic.  She reports medication compliance.  No signs or symptoms of bleeding.  Home Medications    Current Outpatient Medications on File Prior to Visit  Medication Sig Dispense Refill  . clonazePAM (KLONOPIN) 0.5 MG tablet TAKE 1 TABLET BY MOUTH TWICE A DAY AS NEEDED (Patient taking differently: Take 0.5 mg by mouth 2 (two) times daily.) 60 tablet 1  . escitalopram (LEXAPRO) 10 MG tablet Take 1 tablet (10 mg total) by mouth daily. 90 tablet 1  . lisinopril (ZESTRIL) 20 MG tablet Take 1 tablet (20 mg total) by mouth daily. 90 tablet 1  . metoprolol succinate (TOPROL-XL) 100 MG 24 hr tablet TAKE 1 TABLET BY MOUTH DAILY. PLEASE CALL OFFICE FOR BP FOLLOW-UP APPT AS MENTIONED IN MAY. (Patient taking differently: Take 100 mg by mouth at bedtime.) 90 tablet 3   No current facility-administered medications on file prior to visit.    Review of Systems    She reports fatigue and increased rest.  She denies chest pain, palpitations, dyspnea, pnd, orthopnea, n, v, dizziness, syncope, edema, weight gain, or early satiety.  She denies any exertional symptoms.   All other systems reviewed and are otherwise negative except as noted  above.  Physical Exam    VS:  BP (!) 158/98 (BP Location: Left Arm, Patient Position: Sitting, Cuff Size: Normal)   Pulse 60   Ht 5\' 2"  (1.575 m)   Wt 131 lb (59.4 kg)   SpO2 98%   BMI 23.96 kg/m  , BMI Body mass index is 23.96 kg/m. GEN: Well nourished, well developed, in no acute distress.  Facemask in place. HEENT: normal. Neck: Supple, no JVD, carotid bruits, or masses. Cardiac: RRR, 2/6 systolic murmur best appreciated RUSB, LLSB 2/4 diastolic murmur.  No rubs, or gallops. No clubbing, cyanosis, edema.  Radials/DP/PT 2+ and equal bilaterally.  Respiratory:  Respirations regular and unlabored, clear to auscultation bilaterally. GI: Soft, nontender, nondistended, BS + x 4. MS: no deformity or atrophy. Skin: warm and dry, no rash. Neuro:  Strength and sensation are intact. Psych: Normal affect.  Accessory Clinical Findings    ECG  personally reviewed by me today - NSR, 60bpm, PRi 173ms, TWI VI/II as seen in prior tracings, poor R wave progression inferior 3, aVF as seen in prior tracings, poor R wave progression V1, V2 as seen in prior tracings, baseline wander, possible LAE- no acute changes.  VITALS Reviewed today   Temp Readings from Last 3 Encounters:  02/08/21 98.1 F (36.7 C)  05/05/20 (!) 97 F (36.1 C)  01/08/19 98 F (36.7 C)   BP Readings from Last 3 Encounters:  03/22/21 (!) 158/98  02/08/21 122/84  05/05/20 130/80   Pulse Readings from Last 3 Encounters:  03/22/21 60  02/08/21 60  05/05/20 73    Wt Readings from Last 3 Encounters:  03/22/21 131 lb (59.4 kg)  02/08/21 131 lb 6 oz (59.6 kg)  09/15/20 130 lb (59 kg)     LABS  reviewed today   Lab Results  Component Value Date   WBC 5.4 02/08/2021   HGB 12.6 02/08/2021   HCT 36.7 02/08/2021   MCV 95.7 02/08/2021   PLT 314.0 02/08/2021   Lab Results  Component Value Date   CREATININE 0.69 02/08/2021   BUN 9 02/08/2021   NA 136 02/08/2021   K 4.7 02/08/2021   CL 101 02/08/2021   CO2 29  02/08/2021   Lab Results  Component Value Date   ALT 11 02/08/2021   AST 15 02/08/2021   ALKPHOS 83 02/08/2021   BILITOT 0.6 02/08/2021   Lab Results  Component Value Date   CHOL 187 02/08/2021   HDL 93.50 02/08/2021   LDLCALC 82 02/08/2021   TRIG 57.0 02/08/2021   CHOLHDL 2 02/08/2021    Lab Results  Component Value Date   HGBA1C 5.6 05/05/2020   Lab Results  Component Value Date   TSH 2.15 05/05/2020     STUDIES/PROCEDURES reviewed today   Echo 03/08/21 1. Left ventricular ejection fraction, by estimation, is 60 to 65%. The  left ventricle has normal function. The left ventricle has no regional  wall motion abnormalities. There is mild left ventricular hypertrophy.  Left ventricular diastolic parameters  are consistent with Grade I diastolic dysfunction (impaired relaxation).  2. Right ventricular systolic function is normal. The right ventricular  size is normal. There is normal pulmonary artery systolic pressure.  3. Left atrial size was mildly dilated.  4. The mitral valve is normal in structure. Mild mitral valve  regurgitation. No evidence of mitral stenosis.  5. Tricuspid valve regurgitation is moderate.  6. The aortic valve is bicuspid. There is moderate thickening of the  aortic valve. Aortic valve regurgitation is trivial. Severe aortic valve  stenosis. Aortic valve area, by VTI measures 0.50 cm. Aortic valve mean  gradient measures 45.0 mmHg. Aortic  valve Vmax measures 4.42 m/s.  7. Aortic dilatation noted. There is mild dilatation of the ascending  aorta, measuring 41 mm.  8. The inferior vena cava is normal in size with greater than 50%  respiratory variability, suggesting right atrial pressure of 3 mmHg.   Assessment & Plan    Aortic valve stenosis, severe  --Reports fatigue but otherwise asymptomatic and without any chest pain or dyspnea.  She remains very active without exertional symptoms.  Echo reviewed with severe aortic stenosis.   Given her severe stenosis, recommendation is for right and left heart catheterization for further evaluation.  Discussed referral to CT surgery or structural heart team following this catheterization and for formal evaluation/consideration for valvular replacement.  As noted in the past, given  she has a dilated ascending aorta and bicuspid aortic valve, suspect she will not be a TAVR candidate. We discussed both options for valvular replacement. Reviewed aortic stenosis and signs/symptoms of worsening valvular disease / her known bicuspid valve.  Reviewed recommendations for heart rate, BP, and cholesterol control.  Will start low-dose Crestor as below.  Consented for catheterization today.  Will schedule for right and left heart catheterization with Dr. Rockey Situ with BMET and CBC ordered.  Risks and benefits of cardiac catheterization have been discussed with the patient. The patient understands that risks included but are not limited to stroke (1 in 1000), death (1 in 14), kidney failure [usually temporary] (1 in 500), bleeding (1 in 200), allergic reaction [possibly serious] (1 in 200). The patient understands the risks of serious complication is 1-2 in 8101 with diagnostic cardiac cath and 1-2% or less with angioplasty/stenting.   History of bicuspid aortic valve  Dilated ascending aorta, 41 mm by 02/2021 echo --Known history of bicuspid aortic valve and dilated ascending aorta (2015).  Dilated ascending aorta monitored with annual echo and has been stable on imaging.  Continue to monitor with echo/CT. Most recent echo shows ascending aorta at 41 mm.  Previously, 44 mm in size.  Continue to monitor closely.  Risk factor modification with heart rate, BP, and cholesterol control.  Start Crestor 5 mg daily.  Reviewed recommendations to avoid heavy lifting, fluoroquinolones, and as outlined in AVS.  HTN, goal BP 130/80 or lower --BP suboptimal in the setting of severe anxiety regarding her echo results.  She  reports BP is usually well controlled at home with systolic 1 75-1 02H, confirmed on review of EMR.  Continue current lisinopril and Toprol.  Moderate TR, mild MR --Denies any chest pain or shortness of breath.  Fatigue noted in the setting of recent stressors.  Recommend continue to monitor with periodic echo.  BPs, heart rate control recommended.  HLD, goal LDL below 70 --Most recent LDL 82 with recommendation for LDL below 70, given aortic dilation.  Will start low-dose Crestor 5 mg daily.  Repeat lipids and liver in 6 to 8 weeks.   Medication changes: Start Crestor 5 mg daily Labs ordered: BMET, CBC, COVID-19 test Studies / Imaging ordered: Right and left heart catheterization with Dr. Rockey Situ Future considerations: Increase statin if indicated on repeat lipid and liver function in 6 to 8 weeks.  Follow-up after catheterization for further discussion regarding aortic valve. Disposition: RTC after catheterization     Arvil Chaco, PA-C 03/22/2021

## 2021-03-22 NOTE — H&P (View-Only) (Signed)
ldl   Office Visit    Patient Name: Deborah Koch Date of Encounter: 03/22/2021  PCP:  Burnard Hawthorne, [place]  Cardiologist:  Ida Rogue, MD  Advanced Practice Provider:  No care team member to display Electrophysiologist:  None   Chief Complaint    Chief Complaint  Patient presents with  . Follow-up    F/U after echo-would like to discuss whether starting on rosuvastatin would be helpful in treating her cardiac condition    65 year old female with history of bicuspid aortic valve, moderate aortic valve stenosis, mild to moderate TR, 4.1 cm ascending aortic dilation (2015), hypertension, GERD, scoliosis, prior history of tobacco use, anxiety, and who presents today for follow-up of echo with severe aortic stenosis.  Past Medical History    Past Medical History:  Diagnosis Date  . Anxiety   . C. difficile colitis    a. 07/2014.  [name] Congenital bicuspid aortic valve   . Depression   . Dilated Ascending Aorta    a. 12/2013 Echo: 4.45cm.  . Diverticulitis    hospitalized; subsequent c diff infection.   [name] GERD (gastroesophageal reflux disease)   . H/O mastitis 2015  . Heart murmur   . History of tobacco abuse    a. 20 yrs, 1/4 ppd - quit.  [name] HTN (hypertension)   . Hypokalemia   . Moderate to severe aortic stenosis    a. 12/2013 Echo: EF 60-65%, Grade 1 DD, bicuspid AoV with mod-sev AS [42mmHg mean gradient, AoV area 0.69cm^2 (VTI)], mod dil ascending Ao - 4.45cm, mild TR, PASP 35mmHg.  . Multilevel degenerative disc disease   . Scoliosis    Past Surgical History:  Procedure Laterality Date  . BARTHOLIN GLAND CYST EXCISION    . BREAST BIOPSY Left    neg  . COLONOSCOPY  2013   Dr. Allen Norris  . COLONOSCOPY WITH PROPOFOL N/A 10/23/2017   Procedure: COLONOSCOPY WITH PROPOFOL;  Surgeon: Lucilla Lame, MD;  Location: [facility]COPY;  Service: Endoscopy;  Laterality: N/A;  . OOPHORECTOMY  2016  . OVARY SURGERY  2016   cyst on right  ovary    Allergies  Allergies  Allergen Reactions  . Vicodin [Hydrocodone-Acetaminophen] Hives and Rash    History of Present Illness    Deborah Koch is a 65 y.o. female with PMH as above.   07/2019 echo with EF 60 to 02%, NR WMA, diastolic dysfunction.  RVSP 38.2 mmHg.  Mild LAE, mild to moderate TR, bicuspid aortic valve with moderate thickening of the valve and moderate to severe aortic valve stenosis.  Dilation of aortic root 3.2 cm and ascending aorta 4.5 cm.  She was last seen by her primary cardiologist 08/18/2019 via virtual visit.  At that time, she reported anxiety.  She was taking care of her 76 year old mother with Alzheimer's, who was in hospice.  She reported chest pain from stress that improved with Klonopin.  She denied shortness of breath or anginal symptoms with activity.  She reported BP well controlled.  A long discussion took place regarding her aortic valve stenosis.  She denied significant symptoms.  It was noted that it was unclear if she would be a good TAVR candidate, given her ascending dilated aorta and bicuspid aortic valve.  She was interested in meeting with CT surgery at a later date.  Echocardiogram scheduled and completed 02/2021 showing severe aortic stenosis.  Today, 03/22/2021, she returns to clinic and notes significant fatigue.  She  is also very anxious with BP noted to be elevated at 158/98.  She is very concerned regarding her echo results, especially as she was told that her aortic stenosis is severe.  She reports that someone told her she should be very concerned about her diagnosis, which understandably caused a significant amount of anxiety.  Long discussion regarding bicuspid valve/aortic stenosis/and the options for valvular replacement.  Options for work-up reviewed and as below.  Fortunately, she remains relatively asymptomatic.  She has noted increased fatigue lately, though she notes an increased amount of stress since her previous visit.  Her  mother unfortunately passed away 2 years ago.  In addition, she is currently getting a divorce and has moved to [place].  She reports an understandable amount of stress surrounding these changes.  She denies any chest pain, shortness of breath, dyspnea, presyncope, syncope, racing heart rate, palpitations, lower extremity edema, abdominal distention, orthopnea, PND, early satiety, or weight gain.  She reports that she is walking for exercise at 1/2 mile every day and denies any exertional symptoms of chest pain or shortness of breath.  The other day she called luggage up to the third floor of a building without any problems or exertional symptoms.  She reports a healthy diet.  When anxiety is not a factor, she states her BP is normally 1 25-4 20 systolic and 70 to 27C diastolic.  She reports medication compliance.  No signs or symptoms of bleeding.  Home Medications    Current Outpatient Medications on File Prior to Visit  Medication Sig Dispense Refill  . clonazePAM (KLONOPIN) 0.5 MG tablet TAKE 1 TABLET BY MOUTH TWICE A DAY AS NEEDED (Patient taking differently: Take 0.5 mg by mouth 2 (two) times daily.) 60 tablet 1  . escitalopram (LEXAPRO) 10 MG tablet Take 1 tablet (10 mg total) by mouth daily. 90 tablet 1  . lisinopril (ZESTRIL) 20 MG tablet Take 1 tablet (20 mg total) by mouth daily. 90 tablet 1  . metoprolol succinate (TOPROL-XL) 100 MG 24 hr tablet TAKE 1 TABLET BY MOUTH DAILY. PLEASE CALL OFFICE FOR BP FOLLOW-UP APPT AS MENTIONED IN MAY. (Patient taking differently: Take 100 mg by mouth at bedtime.) 90 tablet 3   No current facility-administered medications on file prior to visit.    Review of Systems    She reports fatigue and increased rest.  She denies chest pain, palpitations, dyspnea, pnd, orthopnea, n, v, dizziness, syncope, edema, weight gain, or early satiety.  She denies any exertional symptoms.   All other systems reviewed and are otherwise negative except as noted  above.  Physical Exam    VS:  BP (!) 158/98 (BP Location: Left Arm, Patient Position: Sitting, Cuff Size: Normal)   Pulse 60   Ht 5\' 2"  (1.575 m)   Wt 131 lb (59.4 kg)   SpO2 98%   BMI 23.96 kg/m  , BMI Body mass index is 23.96 kg/m. GEN: Well nourished, well developed, in no acute distress.  Facemask in place. HEENT: normal. Neck: Supple, no JVD, carotid bruits, or masses. Cardiac: RRR, 2/6 systolic murmur best appreciated RUSB, LLSB 2/4 diastolic murmur.  No rubs, or gallops. No clubbing, cyanosis, edema.  Radials/DP/PT 2+ and equal bilaterally.  Respiratory:  Respirations regular and unlabored, clear to auscultation bilaterally. GI: Soft, nontender, nondistended, BS + x 4. MS: no deformity or atrophy. Skin: warm and dry, no rash. Neuro:  Strength and sensation are intact. Psych: Normal affect.  Accessory Clinical Findings    ECG  personally reviewed by me today - NSR, 60bpm, PRi 175ms, TWI VI/II as seen in prior tracings, poor R wave progression inferior 3, aVF as seen in prior tracings, poor R wave progression V1, V2 as seen in prior tracings, baseline wander, possible LAE- no acute changes.  VITALS Reviewed today   Temp Readings from Last 3 Encounters:  02/08/21 98.1 F (36.7 C)  05/05/20 (!) 97 F (36.1 C)  01/08/19 98 F (36.7 C)   BP Readings from Last 3 Encounters:  03/22/21 (!) 158/98  02/08/21 122/84  05/05/20 130/80   Pulse Readings from Last 3 Encounters:  03/22/21 60  02/08/21 60  05/05/20 73    Wt Readings from Last 3 Encounters:  03/22/21 131 lb (59.4 kg)  02/08/21 131 lb 6 oz (59.6 kg)  09/15/20 130 lb (59 kg)     LABS  reviewed today   Lab Results  Component Value Date   WBC 5.4 02/08/2021   HGB 12.6 02/08/2021   HCT 36.7 02/08/2021   MCV 95.7 02/08/2021   PLT 314.0 02/08/2021   Lab Results  Component Value Date   CREATININE 0.69 02/08/2021   BUN 9 02/08/2021   NA 136 02/08/2021   K 4.7 02/08/2021   CL 101 02/08/2021   CO2 29  02/08/2021   Lab Results  Component Value Date   ALT 11 02/08/2021   AST 15 02/08/2021   ALKPHOS 83 02/08/2021   BILITOT 0.6 02/08/2021   Lab Results  Component Value Date   CHOL 187 02/08/2021   HDL 93.50 02/08/2021   LDLCALC 82 02/08/2021   TRIG 57.0 02/08/2021   CHOLHDL 2 02/08/2021    Lab Results  Component Value Date   HGBA1C 5.6 05/05/2020   Lab Results  Component Value Date   TSH 2.15 05/05/2020     STUDIES/PROCEDURES reviewed today   Echo 03/08/21 1. Left ventricular ejection fraction, by estimation, is 60 to 65%. The  left ventricle has normal function. The left ventricle has no regional  wall motion abnormalities. There is mild left ventricular hypertrophy.  Left ventricular diastolic parameters  are consistent with Grade I diastolic dysfunction (impaired relaxation).  2. Right ventricular systolic function is normal. The right ventricular  size is normal. There is normal pulmonary artery systolic pressure.  3. Left atrial size was mildly dilated.  4. The mitral valve is normal in structure. Mild mitral valve  regurgitation. No evidence of mitral stenosis.  5. Tricuspid valve regurgitation is moderate.  6. The aortic valve is bicuspid. There is moderate thickening of the  aortic valve. Aortic valve regurgitation is trivial. Severe aortic valve  stenosis. Aortic valve area, by VTI measures 0.50 cm. Aortic valve mean  gradient measures 45.0 mmHg. Aortic  valve Vmax measures 4.42 m/s.  7. Aortic dilatation noted. There is mild dilatation of the ascending  aorta, measuring 41 mm.  8. The inferior vena cava is normal in size with greater than 50%  respiratory variability, suggesting right atrial pressure of 3 mmHg.   Assessment & Plan    Aortic valve stenosis, severe  --Reports fatigue but otherwise asymptomatic and without any chest pain or dyspnea.  She remains very active without exertional symptoms.  Echo reviewed with severe aortic stenosis.   Given her severe stenosis, recommendation is for right and left heart catheterization for further evaluation.  Discussed referral to CT surgery or structural heart team following this catheterization and for formal evaluation/consideration for valvular replacement.  As noted in the past, given  she has a dilated ascending aorta and bicuspid aortic valve, suspect she will not be a TAVR candidate. We discussed both options for valvular replacement. Reviewed aortic stenosis and signs/symptoms of worsening valvular disease / her known bicuspid valve.  Reviewed recommendations for heart rate, BP, and cholesterol control.  Will start low-dose Crestor as below.  Consented for catheterization today.  Will schedule for right and left heart catheterization with Dr. Rockey Situ with BMET and CBC ordered.  Risks and benefits of cardiac catheterization have been discussed with the patient. The patient understands that risks included but are not limited to stroke (1 in 1000), death (1 in 54), kidney failure [usually temporary] (1 in 500), bleeding (1 in 200), allergic reaction [possibly serious] (1 in 200). The patient understands the risks of serious complication is 1-2 in 9323 with diagnostic cardiac cath and 1-2% or less with angioplasty/stenting.   History of bicuspid aortic valve  Dilated ascending aorta, 41 mm by 02/2021 echo --Known history of bicuspid aortic valve and dilated ascending aorta (2015).  Dilated ascending aorta monitored with annual echo and has been stable on imaging.  Continue to monitor with echo/CT. Most recent echo shows ascending aorta at 41 mm.  Previously, 44 mm in size.  Continue to monitor closely.  Risk factor modification with heart rate, BP, and cholesterol control.  Start Crestor 5 mg daily.  Reviewed recommendations to avoid heavy lifting, fluoroquinolones, and as outlined in AVS.  HTN, goal BP 130/80 or lower --BP suboptimal in the setting of severe anxiety regarding her echo results.  She  reports BP is usually well controlled at home with systolic 1 55-7 32K, confirmed on review of EMR.  Continue current lisinopril and Toprol.  Moderate TR, mild MR --Denies any chest pain or shortness of breath.  Fatigue noted in the setting of recent stressors.  Recommend continue to monitor with periodic echo.  BPs, heart rate control recommended.  HLD, goal LDL below 70 --Most recent LDL 82 with recommendation for LDL below 70, given aortic dilation.  Will start low-dose Crestor 5 mg daily.  Repeat lipids and liver in 6 to 8 weeks.   Medication changes: Start Crestor 5 mg daily Labs ordered: BMET, CBC, COVID-19 test Studies / Imaging ordered: Right and left heart catheterization with Dr. Rockey Situ Future considerations: Increase statin if indicated on repeat lipid and liver function in 6 to 8 weeks.  Follow-up after catheterization for further discussion regarding aortic valve. Disposition: RTC after catheterization     Arvil Chaco, PA-C 03/22/2021

## 2021-03-23 ENCOUNTER — Other Ambulatory Visit: Payer: Self-pay | Admitting: Physician Assistant

## 2021-03-23 LAB — CBC WITH DIFFERENTIAL/PLATELET
Basophils Absolute: 0.1 10*3/uL (ref 0.0–0.2)
Basos: 1 %
EOS (ABSOLUTE): 0.1 10*3/uL (ref 0.0–0.4)
Eos: 2 %
Hematocrit: 37.6 % (ref 34.0–46.6)
Hemoglobin: 12.9 g/dL (ref 11.1–15.9)
Immature Grans (Abs): 0 10*3/uL (ref 0.0–0.1)
Immature Granulocytes: 0 %
Lymphocytes Absolute: 1.8 10*3/uL (ref 0.7–3.1)
Lymphs: 26 %
MCH: 32.3 pg (ref 26.6–33.0)
MCHC: 34.3 g/dL (ref 31.5–35.7)
MCV: 94 fL (ref 79–97)
Monocytes Absolute: 0.6 10*3/uL (ref 0.1–0.9)
Monocytes: 9 %
Neutrophils Absolute: 4.4 10*3/uL (ref 1.4–7.0)
Neutrophils: 62 %
Platelets: 328 10*3/uL (ref 150–450)
RBC: 4 x10E6/uL (ref 3.77–5.28)
RDW: 12.9 % (ref 11.7–15.4)
WBC: 7 10*3/uL (ref 3.4–10.8)

## 2021-03-23 LAB — COMPREHENSIVE METABOLIC PANEL
ALT: 13 IU/L (ref 0–32)
AST: 14 IU/L (ref 0–40)
Albumin/Globulin Ratio: 2.1 (ref 1.2–2.2)
Albumin: 4.8 g/dL (ref 3.8–4.8)
Alkaline Phosphatase: 104 IU/L (ref 44–121)
BUN/Creatinine Ratio: 18 (ref 12–28)
BUN: 11 mg/dL (ref 8–27)
Bilirubin Total: 0.4 mg/dL (ref 0.0–1.2)
CO2: 21 mmol/L (ref 20–29)
Calcium: 9.9 mg/dL (ref 8.7–10.3)
Chloride: 98 mmol/L (ref 96–106)
Creatinine, Ser: 0.62 mg/dL (ref 0.57–1.00)
Globulin, Total: 2.3 g/dL (ref 1.5–4.5)
Glucose: 104 mg/dL — ABNORMAL HIGH (ref 65–99)
Potassium: 5 mmol/L (ref 3.5–5.2)
Sodium: 136 mmol/L (ref 134–144)
Total Protein: 7.1 g/dL (ref 6.0–8.5)
eGFR: 99 mL/min/{1.73_m2} (ref >59)

## 2021-03-23 MED ORDER — SODIUM CHLORIDE 0.9% FLUSH
3.0000 mL | Freq: Two times a day (BID) | INTRAVENOUS | Status: AC
  Filled 2021-03-23: qty 3

## 2021-03-24 ENCOUNTER — Telehealth: Payer: Self-pay | Admitting: *Deleted

## 2021-03-24 ENCOUNTER — Ambulatory Visit
Admission: RE | Admit: 2021-03-24 | Discharge: 2021-03-24 | Disposition: A | Discharge status: Home / Self Care | Payer: Medicare Other | Attending: Cardiovascular Disease | Admitting: Cardiovascular Disease

## 2021-03-24 ENCOUNTER — Other Ambulatory Visit: Payer: Self-pay | Admitting: Physician Assistant

## 2021-03-24 ENCOUNTER — Encounter: Payer: Self-pay | Admitting: Cardiovascular Disease

## 2021-03-24 ENCOUNTER — Encounter
Admission: RE | Disposition: A | Discharge status: Home / Self Care | Payer: Self-pay | Source: Home / Self Care | Attending: Cardiovascular Disease

## 2021-03-24 DIAGNOSIS — Z885 Allergy status to narcotic agent status: Secondary | ICD-10-CM | POA: Diagnosis not present

## 2021-03-24 DIAGNOSIS — I350 Nonrheumatic aortic (valve) stenosis: Secondary | ICD-10-CM

## 2021-03-24 DIAGNOSIS — I10 Essential (primary) hypertension: Secondary | ICD-10-CM | POA: Insufficient documentation

## 2021-03-24 DIAGNOSIS — Q231 Congenital insufficiency of aortic valve: Secondary | ICD-10-CM

## 2021-03-24 DIAGNOSIS — I77810 Thoracic aortic ectasia: Secondary | ICD-10-CM | POA: Diagnosis present

## 2021-03-24 DIAGNOSIS — Q2381 Bicuspid aortic valve: Secondary | ICD-10-CM

## 2021-03-24 DIAGNOSIS — Z79899 Other long term (current) drug therapy: Secondary | ICD-10-CM | POA: Diagnosis not present

## 2021-03-24 DIAGNOSIS — I700 Atherosclerosis of aorta: Secondary | ICD-10-CM

## 2021-03-24 DIAGNOSIS — E785 Hyperlipidemia, unspecified: Secondary | ICD-10-CM | POA: Insufficient documentation

## 2021-03-24 HISTORY — PX: RIGHT/LEFT HEART CATH AND CORONARY ANGIOGRAPHY: CATH118266

## 2021-03-24 SURGERY — RIGHT/LEFT HEART CATH AND CORONARY ANGIOGRAPHY
Anesthesia: Moderate Sedation

## 2021-03-24 MED ORDER — MIDAZOLAM HCL 2 MG/2ML IJ SOLN
INTRAMUSCULAR | Status: AC
  Filled 2021-03-24: qty 2

## 2021-03-24 MED ORDER — SODIUM CHLORIDE 0.9 % IV SOLN: 250.0000 mL | INTRAVENOUS | Status: DC | PRN

## 2021-03-24 MED ORDER — ASPIRIN 81 MG PO CHEW
CHEWABLE_TABLET | ORAL | Status: AC
  Filled 2021-03-24: qty 1

## 2021-03-24 MED ORDER — MIDAZOLAM HCL 2 MG/2ML IJ SOLN
INTRAMUSCULAR | Status: DC | PRN
  Administered 2021-03-24: 1 mg via INTRAVENOUS
  Administered 2021-03-24 (×2): 0.5 mg via INTRAVENOUS

## 2021-03-24 MED ORDER — IOHEXOL 300 MG/ML  SOLN
INTRAMUSCULAR | Status: DC | PRN
  Administered 2021-03-24: 55 mL

## 2021-03-24 MED ORDER — SODIUM CHLORIDE 0.9% FLUSH: 3.0000 mL | Freq: Two times a day (BID) | INTRAVENOUS | Status: DC

## 2021-03-24 MED ORDER — HYDRALAZINE HCL 20 MG/ML IJ SOLN: 10.0000 mg | INTRAMUSCULAR | Status: DC | PRN

## 2021-03-24 MED ORDER — ASPIRIN 81 MG PO CHEW
81.0000 mg | CHEWABLE_TABLET | ORAL | Status: AC
  Administered 2021-03-24: 81 mg via ORAL

## 2021-03-24 MED ORDER — SODIUM CHLORIDE 0.9 % WEIGHT BASED INFUSION: 3.0000 mL/kg/h | INTRAVENOUS | Status: AC

## 2021-03-24 MED ORDER — ONDANSETRON HCL 4 MG/2ML IJ SOLN: 4.0000 mg | Freq: Four times a day (QID) | INTRAMUSCULAR | Status: DC | PRN

## 2021-03-24 MED ORDER — SODIUM CHLORIDE 0.9 % WEIGHT BASED INFUSION: 1.0000 mL/kg/h | INTRAVENOUS | Status: DC

## 2021-03-24 MED ORDER — FENTANYL CITRATE (PF) 100 MCG/2ML IJ SOLN
INTRAMUSCULAR | Status: AC
  Filled 2021-03-24: qty 2

## 2021-03-24 MED ORDER — FENTANYL CITRATE (PF) 100 MCG/2ML IJ SOLN
INTRAMUSCULAR | Status: DC | PRN
  Administered 2021-03-24 (×3): 25 ug via INTRAVENOUS

## 2021-03-24 MED ORDER — SODIUM CHLORIDE 0.9% FLUSH: 3.0000 mL | INTRAVENOUS | Status: DC | PRN

## 2021-03-24 MED ORDER — HEPARIN (PORCINE) IN NACL 1000-0.9 UT/500ML-% IV SOLN
INTRAVENOUS | Status: DC | PRN
  Administered 2021-03-24: 1000 mL

## 2021-03-24 MED ORDER — LABETALOL HCL 5 MG/ML IV SOLN: 10.0000 mg | INTRAVENOUS | Status: DC | PRN

## 2021-03-24 MED ORDER — HEPARIN (PORCINE) IN NACL 1000-0.9 UT/500ML-% IV SOLN
INTRAVENOUS | Status: AC
  Filled 2021-03-24: qty 1000

## 2021-03-24 MED ORDER — ACETAMINOPHEN 325 MG PO TABS: 650.0000 mg | ORAL_TABLET | ORAL | Status: DC | PRN

## 2021-03-24 SURGICAL SUPPLY — 16 items
CATH INFINITI 5FR JL4 (CATHETERS) IMPLANT
CATH INFINITI 5FR JL5 (CATHETERS) ×2 IMPLANT
CATH INFINITI JR4 5F (CATHETERS) ×2 IMPLANT
CATH SWAN GANZ 7F STRAIGHT (CATHETERS) ×2 IMPLANT
DEVICE CLOSURE MYNXGRIP 5F (Vascular Products) ×2 IMPLANT
KIT SYRINGE INJ CVI SPIKEX1 (MISCELLANEOUS) ×2 IMPLANT
NEEDLE PERC 18GX7CM (NEEDLE) ×2 IMPLANT
PACK CARDIAC CATH (CUSTOM PROCEDURE TRAY) ×2 IMPLANT
PROTECTION STATION PRESSURIZED (MISCELLANEOUS) ×2
SET ATX SIMPLICITY (MISCELLANEOUS) ×2 IMPLANT
SHEATH AVANTI 5FR X 11CM (SHEATH) ×2 IMPLANT
SHEATH AVANTI 7FRX11 (SHEATH) ×2 IMPLANT
STATION PROTECTION PRESSURIZED (MISCELLANEOUS) ×1 IMPLANT
WIRE EMERALD ST .035X150CM (WIRE) ×2 IMPLANT
WIRE GUIDERIGHT .035X150 (WIRE) ×2 IMPLANT
WIRE HITORQ VERSACORE ST 145CM (WIRE) ×2 IMPLANT

## 2021-03-24 NOTE — Discharge Instructions (Signed)

## 2021-03-24 NOTE — Telephone Encounter (Signed)
-----   Message from Arvil Chaco, PA-C sent at 03/23/2021  2:24 PM EDT ----- Labs prior to catheterization show  --Stable renal function. --Potassium on the high side of normal. --Liver function stable. --Blood counts stable.  Reassuring labs.   Please ensure that she is not taking any over-the-counter supplements with potassium / eating foods with added or very high potassium content. Otherwise, no further recommendations.

## 2021-03-24 NOTE — Telephone Encounter (Signed)
Attempted to call pt to review lab results. No answer. Lmtcb.

## 2021-03-25 NOTE — Telephone Encounter (Signed)
Spoke to pt. Notified of lab results.  Pt verbalized understanding and confirms that she does not take any form of potassium supplementation.   Pt had cardiac cath yesterday and has questions about plan moving forward.  Confirmed pt's follow up appt for 4/21. Pt asked if she needed to be seen sooner based on results of cath.   Notified pt I will discuss with provider and call her back with recc as I have not received an update that appt need to be r/s.

## 2021-03-25 NOTE — Telephone Encounter (Signed)
Patient returning call for results 

## 2021-03-27 NOTE — Telephone Encounter (Signed)
We can certainly review the catheterization results and plan moving forward at her visit.  Given she was asymptomatic at her clinic visit/prior to her cath, unless she is having new or concerning symptoms, it is reasonable for her to keep her current appointment.  In the meantime, I will make sure to loop in the heart valve team members and see if they have anything to add that we can review at her next visit.

## 2021-03-28 ENCOUNTER — Other Ambulatory Visit: Payer: Medicare Other

## 2021-03-28 NOTE — Telephone Encounter (Signed)
Patient returning call.

## 2021-03-28 NOTE — Telephone Encounter (Signed)
Attempted to call pt to review recc. No answer. Lmtcb.

## 2021-03-28 NOTE — Telephone Encounter (Signed)
Spoke to pt. Notified of provider's recc below.  Pt verbalized understanding, she does not report any new s/s.  She will keep follow up as scheduled in our office 04/07/21 w/ Jacquelyn.  Pt has no further questions.

## 2021-03-29 ENCOUNTER — Encounter: Payer: Self-pay | Admitting: Family

## 2021-03-30 ENCOUNTER — Encounter: Payer: Self-pay | Admitting: Physician Assistant

## 2021-04-06 NOTE — H&P (Signed)
H&P Addendum, precardiac catheterization  Patient was seen and evaluated prior to Cardiac catheterization procedure Symptoms, prior testing details again confirmed with the patient Patient examined, no significant change from prior exam Lab work reviewed in detail personally by myself Patient understands risk and benefit of the procedure, willing to proceed  Signed, Tim Ethelyn Cerniglia, MD, Ph.D [facility]    

## 2021-04-06 NOTE — Interval H&P Note (Signed)
History and Physical Interval Note:  04/06/2021 3:46 PM  Deborah Koch  has presented today for surgery, with the diagnosis of RT LT Heart Cath   Severe aortic valve stenosis, dilated aorta, preop surgery.  The various methods of treatment have been discussed with the patient and family. After consideration of risks, benefits and other options for treatment, the patient has consented to  Procedure(s): RIGHT/LEFT HEART CATH AND CORONARY ANGIOGRAPHY (N/A) as a surgical intervention.  The patient's history has been reviewed, patient examined, no change in status, stable for surgery.  I have reviewed the patient's chart and labs.  Questions were answered to the patient's satisfaction.     Ida Rogue

## 2021-04-07 ENCOUNTER — Ambulatory Visit: Payer: Medicare Other | Admitting: Physician Assistant

## 2021-04-11 ENCOUNTER — Other Ambulatory Visit: Payer: Self-pay

## 2021-04-11 ENCOUNTER — Ambulatory Visit (HOSPITAL_COMMUNITY)
Admission: RE | Admit: 2021-04-11 | Discharge: 2021-04-11 | Disposition: A | Discharge status: Home / Self Care | Payer: Medicare Other | Source: Ambulatory Visit | Attending: Physician Assistant | Admitting: Physician Assistant

## 2021-04-11 DIAGNOSIS — I712 Thoracic aortic aneurysm, without rupture: Secondary | ICD-10-CM | POA: Diagnosis not present

## 2021-04-11 DIAGNOSIS — R911 Solitary pulmonary nodule: Secondary | ICD-10-CM | POA: Diagnosis not present

## 2021-04-11 DIAGNOSIS — I350 Nonrheumatic aortic (valve) stenosis: Secondary | ICD-10-CM

## 2021-04-11 DIAGNOSIS — K760 Fatty (change of) liver, not elsewhere classified: Secondary | ICD-10-CM | POA: Diagnosis not present

## 2021-04-11 DIAGNOSIS — K5730 Diverticulosis of large intestine without perforation or abscess without bleeding: Secondary | ICD-10-CM | POA: Diagnosis not present

## 2021-04-11 MED ORDER — IOHEXOL 350 MG/ML SOLN
100.0000 mL | Freq: Once | INTRAVENOUS | Status: AC | PRN
  Administered 2021-04-11: 100 mL via INTRAVENOUS

## 2021-04-14 ENCOUNTER — Other Ambulatory Visit: Payer: Self-pay

## 2021-04-14 ENCOUNTER — Institutional Professional Consult (permissible substitution): Payer: Medicare Other | Admitting: Surgery

## 2021-04-14 VITALS — BP 170/100 | HR 60 | Resp 20 | Ht 62.0 in | Wt 130.0 lb

## 2021-04-14 DIAGNOSIS — I350 Nonrheumatic aortic (valve) stenosis: Secondary | ICD-10-CM | POA: Diagnosis not present

## 2021-04-15 ENCOUNTER — Encounter: Payer: Self-pay | Admitting: Surgery

## 2021-04-15 ENCOUNTER — Other Ambulatory Visit: Payer: Self-pay | Admitting: *Deleted

## 2021-04-15 DIAGNOSIS — I712 Thoracic aortic aneurysm, without rupture, unspecified: Secondary | ICD-10-CM

## 2021-04-15 DIAGNOSIS — Q230 Congenital stenosis of aortic valve: Secondary | ICD-10-CM

## 2021-04-15 NOTE — Progress Notes (Signed)
Cardiothoracic Surgery Consultation  PCP is Arnett, [name], [name], MD  Chief Complaint  Patient presents with  . Aortic Stenosis    Surgical consult, ECHO 03/08/21, Cardiac Cath 03/24/21    HPI:  The patient is a 65 year old woman with a history of hypertension, tobacco abuse, anxiety and depression, GERD, scoliosis, and bicuspid aortic valve disease with moderate aortic stenosis and known enlargement of the ascending aorta.  Her last echocardiogram in August 2020 showed a mean gradient across aortic valve of 30 mmHg with a peak gradient of 56 mmHg.  The aortic valve area at that time is 0.84 cm consistent with severe aortic stenosis.  The aortic root was measured at 3.2 cm with an ascending aorta 4.5 cm.  Left ventricular ejection fraction was 60 to 65%.  She was not having any symptoms at that time and was continued on medical treatment with follow-up.  Her most recent echo on 03/08/2021 showed an increase in the mean gradient to 45 mmHg.  There was trivial aortic insufficiency.  There is moderate thickening of a bicuspid aortic valve.  The ascending aorta was measured at 41 mm.  Left ventricular ejection fraction was measured at 60 to 65% with grade 1 diastolic dysfunction and mild left ventricular hypertrophy.  She now reports development of exertional fatigue as well as dizziness and some chest tightness.  She is also under a large amount of emotional stress due to currently undergoing a divorce.  She denies any orthopnea or PND.  She has had no peripheral edema.  She is able to walk about 1/2 mile every day and does not have any symptoms while she is walking.  She does tend to get very fatigued later in the day.  She underwent cardiac catheterization on 03/24/2021 which showed nonobstructive coronary disease.  The aortic valve could not be crossed.  Right heart pressures were normal.  Cardiac index was 2.36.  She underwent a gated cardiac CTA as well as a  CTA of the chest, abdomen, and pelvis.  This showed the ascending aorta to be aneurysmal with a diameter of 4.7 cm.  The dilatation extends from the sinotubular junction to the mid aortic arch between the left common carotid and left subclavian arteries.  The maximal sinus diameter is 34 mm.  The patient is here today with her sister.  The patient is currently going through a divorce and moved to [place] to be closer to her family.  She continues to smoke a few cigarettes per day. Past Medical History:  Diagnosis Date  . Anxiety   . C. difficile colitis    a. 07/2014.  [name] Congenital bicuspid aortic valve   . Depression   . Dilated Ascending Aorta    a. 12/2013 Echo: 4.45cm.  . Diverticulitis    hospitalized; subsequent c diff infection.   [name] GERD (gastroesophageal reflux disease)   . H/O mastitis 2015  . Heart murmur   . History of tobacco abuse    a. 20 yrs, 1/4 ppd - quit.  [name] HTN (hypertension)   . Hypokalemia   . Moderate to severe aortic stenosis    a. 12/2013 Echo: EF 60-65%, Grade 1 DD, bicuspid AoV with mod-sev AS [30mmHg mean gradient, AoV area 0.69cm^2 (VTI)], mod dil ascending Ao - 4.45cm, mild TR, PASP 30mmHg.  . Multilevel degenerative disc disease   . Scoliosis     Past Surgical History:  Procedure Laterality Date  . BARTHOLIN GLAND CYST EXCISION    .  BREAST BIOPSY Left    neg  . COLONOSCOPY  2013   Dr. Allen Norris  . COLONOSCOPY WITH PROPOFOL N/A 10/23/2017   Procedure: COLONOSCOPY WITH PROPOFOL;  Surgeon: Lucilla Lame, MD;  Location: [facility] ENDOSCOPY;  Service: Endoscopy;  Laterality: N/A;  . OOPHORECTOMY  2016  . OVARY SURGERY  2016   cyst on right ovary  . RIGHT/LEFT HEART CATH AND CORONARY ANGIOGRAPHY N/A 03/24/2021   Procedure: RIGHT/LEFT HEART CATH AND CORONARY ANGIOGRAPHY;  Surgeon: Minna Merritts, MD;  Location: [place] [facility];  Service: Cardiovascular;  Laterality: N/A;    Family History  Problem Relation Age of Onset  . Hypertension Mother   . Colon  cancer Mother   . Anxiety disorder Mother   . Depression Mother   . Heart failure Father   . Hypertension Father   . Depression Sister   . Anxiety disorder Sister   . Alcohol abuse Brother   . Schizophrenia Brother   . Breast cancer Maternal Aunt   . Heart attack Neg Hx   . Stroke Neg Hx     Social History Social History   Tobacco Use  . Smoking status: Light Tobacco Smoker    Packs/day: 0.25    Years: 20.00    Pack years: 5.00    Types: Cigarettes  . Smokeless tobacco: Never Used  Vaping Use  . Vaping Use: Never used  Substance Use Topics  . Alcohol use: Yes    Alcohol/week: 12.0 standard drinks    Types: 10 Glasses of wine, 2 Cans of beer per week    Comment:  2-3 glasses of wine every other evening  . Drug use: No    Current Outpatient Medications  Medication Sig Dispense Refill  . clonazePAM (KLONOPIN) 0.5 MG tablet TAKE 1 TABLET BY MOUTH TWICE A DAY AS NEEDED (Patient taking differently: Take 0.5 mg by mouth 2 (two) times daily.) 60 tablet 1  . escitalopram (LEXAPRO) 10 MG tablet Take 1 tablet (10 mg total) by mouth daily. (Patient taking differently: Take 10 mg by mouth in the morning.) 90 tablet 1  . lisinopril (ZESTRIL) 20 MG tablet Take 1 tablet (20 mg total) by mouth daily. (Patient taking differently: Take 20 mg by mouth in the morning.) 90 tablet 1  . metoprolol succinate (TOPROL-XL) 100 MG 24 hr tablet TAKE 1 TABLET BY MOUTH DAILY. PLEASE CALL OFFICE FOR BP FOLLOW-UP APPT AS MENTIONED IN MAY. (Patient taking differently: Take 100 mg by mouth at bedtime.) 90 tablet 3  . rosuvastatin (CRESTOR) 5 MG tablet Take 1 tablet (5 mg total) by mouth daily. (Patient taking differently: Take 5 mg by mouth in the morning.) 30 tablet 5   No current facility-administered medications for this visit.   Facility-Administered Medications Ordered in Other Visits  Medication Dose Route Frequency Provider Last Rate Last Admin  . sodium chloride flush (NS) 0.9 % injection 3 mL  3  mL Intravenous Q12H Visser, Jacquelyn D, PA-C        Allergies  Allergen Reactions  . Vicodin [Hydrocodone-Acetaminophen] Hives and Rash    Review of Systems  Constitutional: Positive for fatigue. Negative for activity change and unexpected weight change.  HENT: Negative.        Last saw the dentist 6 months ago  Eyes: Negative.   Respiratory: Positive for chest tightness. Negative for shortness of breath.   Cardiovascular: Positive for chest pain. Negative for palpitations and leg swelling.  Gastrointestinal: Negative.   Endocrine: Negative.   Genitourinary: Negative.  Musculoskeletal: Negative.   Skin: Negative.   Allergic/Immunologic: Negative.   Neurological: Negative for syncope and headaches.  Hematological: Negative.   Psychiatric/Behavioral:       Anxiety and depression, nervousness    BP (!) 170/100   Pulse 60   Resp 20   Ht 5\' 2"  (1.575 m)   Wt 130 lb (59 kg)   SpO2 98% Comment: RA  BMI 23.78 kg/m  Physical Exam Constitutional:      Appearance: Normal appearance. She is normal weight.  HENT:     Head: Normocephalic and atraumatic.     Nose: Nose normal.     Mouth/Throat:     Mouth: Mucous membranes are moist.     Pharynx: Oropharynx is clear.  Eyes:     Extraocular Movements: Extraocular movements intact.     Conjunctiva/sclera: Conjunctivae normal.     Pupils: Pupils are equal, round, and reactive to light.  Cardiovascular:     Rate and Rhythm: Normal rate and regular rhythm.     Pulses: Normal pulses.     Heart sounds: Murmur heard.      Comments: 3/6 systolic murmur along the right sternal border.  There is no diastolic murmur. Pulmonary:     Effort: Pulmonary effort is normal.     Breath sounds: Normal breath sounds.  Abdominal:     General: Abdomen is flat. Bowel sounds are normal. There is no distension.     Palpations: Abdomen is soft.     Tenderness: There is no abdominal tenderness.  Musculoskeletal:        General: No swelling.      Cervical back: Normal range of motion and neck supple.     Comments: Scoliosis  Skin:    General: Skin is warm and dry.  Neurological:     General: No focal deficit present.     Mental Status: She is alert and oriented to person, place, and time.  Psychiatric:        Mood and Affect: Mood normal.        Behavior: Behavior normal.      Diagnostic Tests:  ECHOCARDIOGRAM REPORT       Patient Name:  MARCIA HARTWELL Date of Exam: 03/08/2021  Medical Rec #: [id number]      Height:    62.5 in  Accession #:  [id number]      Weight:    131.4 lb  Date of Birth: 12/28/1955      BSA:     1.609 m  Patient Age:  62 years       BP:      120/80 mmHg  Patient Gender: F          HR:      75 bpm.  Exam Location: [place]   Procedure: 2D Echo, 3D Echo, Cardiac Doppler and Color Doppler   Indications:  I35.2 Nonrheumatic aortic (valve) stenosis with  insufficiency    History:    Patient has prior history of Echocardiogram examinations,  most         recent 07/30/2019. Aortic Valve Disease; Risk         Factors:Hypertension.    Sonographer:  Geradine Girt  Referring Phys: [place]    1. Left ventricular ejection fraction, by estimation, is 60 to 65%. The  left ventricle has normal function. The left ventricle has no regional  wall motion abnormalities. There is mild left ventricular hypertrophy.  Left ventricular diastolic parameters  are  consistent with Grade I diastolic dysfunction (impaired relaxation).  2. Right ventricular systolic function is normal. The right ventricular  size is normal. There is normal pulmonary artery systolic pressure.  3. Left atrial size was mildly dilated.  4. The mitral valve is normal in structure. Mild mitral valve  regurgitation. No evidence of mitral stenosis.  5. Tricuspid valve regurgitation is moderate.  6. The aortic  valve is bicuspid. There is moderate thickening of the  aortic valve. Aortic valve regurgitation is trivial. Severe aortic valve  stenosis. Aortic valve area, by VTI measures 0.50 cm. Aortic valve mean  gradient measures 45.0 mmHg. Aortic  valve Vmax measures 4.42 m/s.  7. Aortic dilatation noted. There is mild dilatation of the ascending  aorta, measuring 41 mm.  8. The inferior vena cava is normal in size with greater than 50%  respiratory variability, suggesting right atrial pressure of 3 mmHg.   FINDINGS  Left Ventricle: Left ventricular ejection fraction, by estimation, is 60  to 65%. The left ventricle has normal function. The left ventricle has no  regional wall motion abnormalities. The left ventricular internal cavity  size was normal in size. There is  mild left ventricular hypertrophy. Left ventricular diastolic parameters  are consistent with Grade I diastolic dysfunction (impaired relaxation).   Right Ventricle: The right ventricular size is normal. No increase in  right ventricular wall thickness. Right ventricular systolic function is  normal. There is normal pulmonary artery systolic pressure. The tricuspid  regurgitant velocity is 2.82 m/s, and  with an assumed right atrial pressure of 3 mmHg, the estimated right  ventricular systolic pressure is 93.8 mmHg.   Left Atrium: Left atrial size was mildly dilated.   Right Atrium: Right atrial size was normal in size.   Pericardium: There is no evidence of pericardial effusion.   Mitral Valve: The mitral valve is normal in structure. Mild mitral valve  regurgitation. No evidence of mitral valve stenosis.   Tricuspid Valve: The tricuspid valve is normal in structure. Tricuspid  valve regurgitation is moderate.   Aortic Valve: The aortic valve is bicuspid. There is moderate thickening  of the aortic valve. Aortic valve regurgitation is trivial. Aortic  regurgitation PHT measures 726 msec. Severe aortic stenosis is  present.  Aortic valve mean gradient measures 45.0  mmHg. Aortic valve peak gradient measures 78.1 mmHg. Aortic valve area, by  VTI measures 0.50 cm.   Pulmonic Valve: The pulmonic valve was not well visualized. Pulmonic valve  regurgitation is trivial. No evidence of pulmonic stenosis.   Aorta: Aortic dilatation noted. There is mild dilatation of the ascending  aorta, measuring 41 mm.   Pulmonary Artery: The pulmonary artery is not well seen.   Venous: The inferior vena cava is normal in size with greater than 50%  respiratory variability, suggesting right atrial pressure of 3 mmHg.   IAS/Shunts: No atrial level shunt detected by color flow Doppler.     LEFT VENTRICLE  PLAX 2D  LVIDd:     4.40 cm Diastology  LVIDs:     2.60 cm LV e' medial:  5.55 cm/s  LV PW:     1.00 cm LV E/e' medial: 14.8  LV IVS:    1.10 cm LV e' lateral:  7.18 cm/s  LVOT diam:   1.90 cm LV E/e' lateral: 11.5  LV SV:     54  LV SV Index:  33  LVOT Area:   2.84 cm  3D Volume EF:             3D EF:    65 %             LV EDV:    111 ml             LV ESV:    39 ml             LV SV:    72 ml   RIGHT VENTRICLE  RV S prime:   9.90 cm/s  TAPSE (M-mode): 2.8 cm   LEFT ATRIUM       Index    RIGHT ATRIUM      Index  LA diam:    3.30 cm 2.05 cm/m RA Area:   15.10 cm  LA Vol (A2C):  32.3 ml 20.08 ml/m RA Volume:  35.20 ml 21.88 ml/m  LA Vol (A4C):  33.2 ml 20.64 ml/m  LA Biplane Vol: 34.0 ml 21.14 ml/m  AORTIC VALVE  AV Area (Vmax):  0.47 cm  AV Area (Vmean):  0.43 cm  AV Area (VTI):   0.50 cm  AV Vmax:      442.00 cm/s  AV Vmean:     305.333 cm/s  AV VTI:      1.070 m  AV Peak Grad:   78.1 mmHg  AV Mean Grad:   45.0 mmHg  LVOT Vmax:     73.10 cm/s  LVOT Vmean:    46.800 cm/s  LVOT VTI:     0.190  m  LVOT/AV VTI ratio: 0.18  AI PHT:      726 msec    AORTA  Ao Root diam: 2.70 cm  Ao Asc diam: 4.10 cm   MITRAL VALVE        TRICUSPID VALVE  MV Area (PHT): 2.41 cm  TR Peak grad:  31.8 mmHg  MV Decel Time: 315 msec  TR Vmax:    282.00 cm/s  MV E velocity: 82.30 cm/s  MV A velocity: 97.70 cm/s SHUNTS  MV E/A ratio: 0.84    Systemic VTI: 0.19 m               Systemic Diam: 1.90 cm   Nelva Bush MD  Electronically signed by Nelva Bush MD  Signature Date/Time: 03/08/2021/6:11:04 PM     Physicians  Panel Physicians Referring Physician Case Authorizing Physician  Minna Merritts, MD (Primary)      Procedures  RIGHT/LEFT HEART CATH AND CORONARY ANGIOGRAPHY   Indications  Severe aortic stenosis [I35.0 (ICD-10-CM)]   Procedural Details  Technical Details Cardiac Catheterization Procedure Note  Name: Danial Flippin MRN: [id number] DOB: 1956-05-21  Procedure: Left Heart Cath, Selective Coronary Angiography, LV angiography, right heart catheterization  Indication:  65 year old woman with history of bicuspid aortic valve, prior smoking history, dilated ascending aorta, echocardiogram demonstrating severe aortic valve stenosis Severe aortic valve  stenosis. Aortic valve area, by VTI measures 0.50 cm. Aortic valve mean  gradient measures 45.0 mmHg. Aortic  valve Vmax measures 4.42 m/s.  She presents today for preoperative evaluation for severe aortic valve stenosis, dilated ascending aorta   Procedural details: The right groin was prepped, draped, and anesthetized with 1% lidocaine. Using modified Seldinger technique, a 5 French sheath was introduced into the right femoral artery. Standard Judkins catheters (JL 5, JR 4 and pigtail catheter, straight wire were used in attempt to cross the aortic valve) were used for coronary angiography and left ventriculography.  7 French sheath placed to  right femoral vein for right  heart catheter pressures using Swan-Ganz catheter.  catheter exchanges were performed over a guidewire. There were no immediate procedural complications. The patient was transferred to the post catheterization recovery area for further monitoring.  Moderate sedation: 1. Sedation used: 2 mg versed, 75 ug fentanyl IV push 2. Time of administration:       9:30 AM     Time patient left for recovery 10 AM Total sedation time 30 minutes 3. I was Face to Face with the patient during this time: (code: [phone])   Procedural Findings:    Coronary angiography:  Coronary dominance: Right  Left mainstem:   Large vessel that bifurcates into the LAD and left circumflex, no significant disease noted  Left anterior descending (LAD):   Large vessel that extends to the apical region, diagonal branch 2 of moderate size, no significant disease noted  Left circumflex (LCx):  Large vessel with OM branch 2, no significant disease noted  Right coronary artery (RCA):  Right dominant vessel with PL and PDA, no significant disease noted  Left ventriculography: Unable to cross the aortic valve secondary to aortic valve stenosis, straight wire used, multiple attempts, versa core wire used, unsuccessful crossing  Right heart catheterization pressures Wedge 7 PA 27/9/18 RV 28/1/7 RA 5 Ao 140/77/75 Cardiac output 3.76, cardiac index 2.36  Final Conclusions:   Nonobstructive coronary disease Unable to cross aortic valve secondary to aortic valve stenosis Normal right heart pressures  Recommendations:  We will discuss case with TAVR team in [place] Significant scoliosis,  will likely need CT given dilated aorta TEE could be performed for better visualization of the valve  Ida Rogue 03/24/2021, 9:58 AM  Estimated blood loss <50 mL.   During this procedure medications were administered to achieve and maintain moderate conscious sedation while the patient's heart rate, blood pressure, and oxygen  saturation were continuously monitored and I was present face-to-face 100% of this time.   Medications (Filter: Administrations occurring from [phone] to 1001 on 03/24/21) (important) Continuous medications are totaled by the amount administered until 03/24/21 1001.    fentaNYL (SUBLIMAZE) injection (mcg) Total dose:  75 mcg  Date/Time Rate/Dose/Volume Action   03/24/21 0902 25 mcg Given   0904 25 mcg Given   0917 25 mcg Given    midazolam (VERSED) injection (mg) Total dose:  2 mg  Date/Time Rate/Dose/Volume Action   03/24/21 0902 1 mg Given   0904 0.5 mg Given   0917 0.5 mg Given    Heparin (Porcine) in NaCl 1000-0.9 UT/500ML-% SOLN (mL) Total volume:  1,000 mL  Date/Time Rate/Dose/Volume Action   03/24/21 0948 1,000 mL Given    iohexol (OMNIPAQUE) 300 MG/ML solution (mL) Total volume:  55 mL  Date/Time Rate/Dose/Volume Action   03/24/21 0949 55 mL Given    Sedation Time  Sedation Time Physician-1: 41 minutes 55 seconds   Contrast  Medication Name Total Dose  iohexol (OMNIPAQUE) 300 MG/ML solution 55 mL    Radiation/Fluoro  Fluoro time: 15.6 (min) DAP: 24.7 (Gycm2) Cumulative Air Kerma: 307 (mGy)   Coronary Findings   Diagnostic Dominance: Right  No diagnostic findings have been documented.  Intervention   No interventions have been documented.  Coronary Diagrams   Diagnostic Dominance: Right    Intervention    Implants    Vascular Products   Device Closure Mynxgrip 52f [id number] - Implanted  Inventory item: DEVICE CLOSURE MYNXGRIP 48F Model/Cat number: [id number]  Manufacturer: [employer] Lot number: [id number]  Device identifier: [id number] Device identifier type: GS1  Area Of Implantation: Groin     GUDID Information  Request status Successful    Brand name: [facility] Version/Model: [id number]  Company name: [employer] MRI safety info as of 03/24/21: MR Safe  Contains dry or latex rubber: No    GMDN P.T. name:  Wound hydrogel dressing, non-antimicrobial     As of 03/24/2021  Status: Implanted       Syngo Images  Show images for CARDIAC CATHETERIZATION  Images on Long Term Storage  Show images for Matoya, Laurila "Olegario Messier"  Link to Procedure Log  Procedure Log     Hemo Data (last day) before discharge  AO Systolic Cath Pressure AO Diastolic Cath Pressure AO Mean Cath Pressure PA Systolic Cath Pressure PA Diastolic Cath Pressure PA Mean Cath Pressure RA Wedge A Wave RA Wedge V Wave RV Systolic Cath Pressure RV Diastolic Cath Pressure RV End Diastolic RV Systolic RV End Diastolic RV dP/dt PCW A Wave PCW V Wave PCW Mean AO O2 Sat PA O2 Sat AO O2 Sat Fick C.O. Fick C.I.  -- -- -- -- -- -- 8 mmHg 5 mmHg -- -- -- 28 mmHg 7 mmHg 240 mmHg/sec 10 mmHg 9 mmHg 7 mmHg -- -- -- 3.76 L/min 2.36 L/min/m2  -- -- -- 20 mmHg 8 mmHg 14 mmHg -- -- -- -- -- -- -- -- -- -- -- -- -- -- -- --  -- -- -- 27 mmHg 9 mmHg 18 mmHg -- -- -- -- -- -- -- -- -- -- -- -- -- -- -- --  -- -- -- -- -- -- -- -- 28 mmHg 1 mmHg 7 mmHg -- -- -- -- -- -- -- -- -- -- --  140 77 mmHg 75 mmHg -- -- -- -- -- -- -- -- -- -- -- -- -- -- -- -- -- -- --  -- -- -- -- -- -- -- -- -- -- -- -- -- -- -- -- -- 94.8 % -- SA -- --  -- -- -- -- -- -- -- -- -- -- --               ADDENDUM REPORT: 04/11/2021 12:23  CLINICAL DATA:  Severe Aortic Stenosis.  EXAM: Cardiac TAVR CT  TECHNIQUE: The patient was scanned on a [employer]. A 90 kV retrospective scan was triggered in the descending thoracic aorta at 111 HU's. Gantry rotation speed was 250 msecs and collimation was .6 mm. No beta blockade or nitro were given. The 3D data set was reconstructed in 5% intervals of the R-R cycle. Systolic and diastolic phases were analyzed on a dedicated work station using MPR, MIP and VRT modes. The patient received 100 cc of contrast.  FINDINGS: Image quality: Excellent.  Noise artifact is: Limited.  Valve Morphology: The  aortic valve is a 2-sinus bicuspid valve with latero-lateral configuration. There is no raphe. The leaflets are severely thickened with restricted movement in systole. There is bulky calcification noted in the anterior commissure.  Aortic Valve Calcium score: 635  Aortic annular dimension:  Phase assessed: 10%  Annular area: 423 mm2  Annular perimeter: 73.2 mm  Max diameter: 24.1 mm  Min diameter: 22.7 mm  Annular and subannular calcification: Minimal annular calcification under the anterior commissure.  Optimal coplanar projection: LAO 4 CRA 10  Coronary Artery Height above Annulus:  Left Main: 16.9 mm  Right Coronary: 12.0 mm  Sinus of Valsalva Measurements:  Inter-commissure: 28 mm  Maximal diameter: 34 mm  Sinus of Valsalva Height:  Right lateral: 16.6 mm  Left lateral: 22.9 mm  Sinotubular Junction: 36 mm  Ascending Thoracic Aorta: Aneurysmal up to 47 mm (double-oblique).  Coronary Arteries: Normal coronary origin. Right dominance. The study was performed without use of NTG and is insufficient for plaque evaluation. Please refer to recent cardiac catheterization for coronary assessment. 3-vessel coronary calcifications noted.  Cardiac Morphology:  Right Atrium: Right atrial size is within normal limits.  Right Ventricle: The right ventricular cavity is within normal limits.  Left Atrium: Left atrial size is normal in size with no left atrial appendage filling defect.  Left Ventricle: The ventricular cavity size is within normal limits. There are no stigmata of prior infarction. There is no abnormal filling defect. Normal left ventricular function, LVEF 72%. No regional wall motion abnormalities.  Pulmonary arteries: Normal in size without proximal filling defect.  Pulmonary veins: Normal pulmonary venous drainage.  Pericardium: Normal thickness with no significant effusion or calcium present.  Mitral Valve:  The mitral valve is normal structure without significant calcification.  Extra-cardiac findings: See attached radiology report for non-cardiac structures.  IMPRESSION: 1. The aortic valve is a 2-sinus bicuspid valve with latero-lateral configuration without raphe. Bulky calcification noted in the anterior commissure.  2. Annular measurements appropriate for 23 mm Sapien 3 TAVR (423 mm2).  3. Minimal annular calcification noted.  4. Sufficient coronary to annulus distance.  5. Optimal Fluoroscopic Angle for Delivery: LAO 4 CRA 10  6. Aneurysm of the ascending aorta up to 47 mm (double-oblique). Tortuous descending aorta noted.  7. 3-vessel coronary calcifications.  [place] T. Audie Box, MD   Electronically Signed   By: Eleonore Chiquito   On: 04/11/2021 12:23   CLINICAL DATA:  65 year old female with history of severe aortic stenosis. Preprocedural study prior to potential transcatheter aortic valve replacement (TAVR) procedure.  EXAM: CT CHEST, ABDOMEN AND PELVIS WITHOUT CONTRAST  TECHNIQUE: Multidetector CT imaging of the chest, abdomen and pelvis was performed following the standard protocol without IV contrast.  COMPARISON:  CT the abdomen and pelvis 12/26/2016. No prior chest CT.  FINDINGS: CTA CHEST FINDINGS  Cardiovascular: Heart size is normal. There is no significant pericardial fluid, thickening or pericardial calcification. There is aortic atherosclerosis, as well as atherosclerosis of the great vessels of the mediastinum and the coronary arteries, including calcified atherosclerotic plaque in the left anterior descending and right coronary arteries. Severe thickening calcification of the aortic valve. Aneurysmal dilatation of the ascending thoracic aorta which measures up to 4.7 cm in diameter.  Mediastinum/Lymph Nodes: No pathologically enlarged mediastinal or hilar lymph nodes. Esophagus is unremarkable in appearance. No axillary  lymphadenopathy.  Lungs/Pleura: No acute consolidative airspace disease. No pleural effusions. 4 mm right upper lobe pulmonary nodule (axial image 43 of series 5). No other larger more suspicious appearing pulmonary nodules or masses are noted.  Musculoskeletal/Soft Tissues: Severe dextroscoliosis of the thoracolumbar spine. There are no aggressive appearing lytic or blastic lesions noted in the visualized portions of the skeleton.  CTA ABDOMEN AND PELVIS FINDINGS  Hepatobiliary: Diffuse low attenuation throughout the hepatic parenchyma, indicative of a background of hepatic steatosis. No suspicious cystic or solid hepatic lesions. No intra or extrahepatic biliary ductal dilatation. Gallbladder is normal in appearance.  Pancreas: No pancreatic mass. No pancreatic ductal dilatation. No pancreatic or peripancreatic fluid collections or inflammatory changes.  Spleen: Unremarkable.  Adrenals/Urinary Tract: Bilateral kidneys and adrenal glands are normal in appearance. No hydroureteronephrosis. Urinary bladder is normal in  appearance.  Stomach/Bowel: Normal appearance of the stomach. No pathologic dilatation of small bowel or colon. Numerous colonic diverticulae are noted, particularly in the sigmoid colon, without surrounding inflammatory changes to suggest an acute diverticulitis at this time. Normal appendix.  Vascular/Lymphatic: Aortic atherosclerosis, without evidence of aneurysm or dissection in the abdominal or pelvic vasculature. Vascular findings and measurements pertinent to potential TAVR procedure, as detailed below. High attenuation soft tissue stranding adjacent to the right common femoral artery and proximal right superficial femoral artery, likely to reflect a small postprocedural hematoma. No lymphadenopathy noted in the abdomen or pelvis.  Reproductive: Uterus and ovaries are atrophic.  Other: No significant volume of ascites.  No  pneumoperitoneum.  Musculoskeletal: There are no aggressive appearing lytic or blastic lesions noted in the visualized portions of the skeleton.  VASCULAR MEASUREMENTS PERTINENT TO TAVR:  AORTA:  Minimal Aortic Diameter-14 x 12 mm  Severity of Aortic Calcification-mild to moderate  RIGHT PELVIS:  Right Common Iliac Artery -  Minimal Diameter-7.1 x 5.9 mm  Tortuosity-mild  Calcification-mild  Right External Iliac Artery -  Minimal Diameter-5.8 x 5.4 mm  Tortuosity-mild  Calcification-none  Right Common Femoral Artery -  Minimal Diameter-6.6 x 4.7 mm  Tortuosity-mild  Calcification-mild  LEFT PELVIS:  Left Common Iliac Artery -  Minimal Diameter-7.0 x 8.3 mm  Tortuosity-mild  Calcification-mild  Left External Iliac Artery -  Minimal Diameter-5.8 x 5.8 mm  Tortuosity-moderate  Calcification-none  Left Common Femoral Artery -  Minimal Diameter-6.0 x 6.0 mm  Tortuosity-mild  Calcification-none  Review of the MIP images confirms the above findings.  IMPRESSION: 1. Vascular findings and measurements pertinent to potential TAVR procedure, as detailed above. 2. Severe thickening calcification of the aortic valve, the appearance of which suggests probable bicuspid valve, compatible with reported clinical history of severe aortic stenosis. 3. Aortic atherosclerosis with 2 vessel coronary artery disease and aneurysmal dilatation of the ascending thoracic aorta (4.7 cm in diameter). Ascending thoracic aortic aneurysm. Recommend semi-annual imaging followup by CTA or MRA and referral to cardiothoracic surgery if not already obtained. This recommendation follows 2010 ACCF/AHA/AATS/ACR/ASA/SCA/SCAI/SIR/STS/SVM Guidelines for the Diagnosis and Management of Patients With Thoracic Aortic Disease. Circulation. 2010; [id number]. Aortic aneurysm NOS (ICD10-I71.9). 4. 4 mm right upper lobe pulmonary nodule, nonspecific,  but statistically likely benign. No follow-up needed if patient is low-risk. Non-contrast chest CT can be considered in 12 months if patient is high-risk. This recommendation follows the consensus statement: Guidelines for Management of Incidental Pulmonary Nodules Detected on CT Images: From the Fleischner Society 2017; Radiology 2017; 284:228-243. 5. Severe colonic diverticulosis without evidence of acute diverticulitis at this time. 6. Hepatic steatosis. 7. Additional incidental findings, as above.   Electronically Signed   By: Vinnie Langton M.D.   On: 04/12/2021 06:02   Impression:  This 65 year old woman has severe bicuspid aortic valve stenosis with a 4.7 cm fusiform ascending aortic aneurysm.  She has New York Heart Association class II symptoms of exertional fatigue consistent with chronic diastolic congestive heart failure as well as some positional dizziness and intermittent chest discomfort at rest.  I have personally reviewed her 2D echocardiogram, cardiac catheterization, and CTA studies.  Her echocardiogram shows a bicuspid aortic valve with thickening and restricted leaflet motion.  Her mean gradient is increased from 30 to 45 mmHg over the past year.  Left ventricular systolic function is normal.  CTA of the chest shows a 4.7 cm fusiform ascending aortic aneurysm that extends from the level of the sinotubular junction up  to the proximal aortic arch.  The aorta adjacent to the innominate artery is still 4.1 cm and does not return to normal until just beyond the left common carotid artery.  Cardiac catheterization shows nonobstructive coronary disease.  I think the best treatment for her is aortic valve replacement using a bioprosthetic valve and replacement of the ascending aorta and proximal aortic arch from the level of the sinotubular junction to the mid aortic arch between the left common carotid and left subclavian arteries.  This will require right axillary artery  cannulation and circulatory arrest.  She will require bilateral radial arterial lines for blood pressure monitoring.  The sinuses of Valsalva are fairly normal and I do not think she requires aortic root replacement.  I reviewed all the studies with her and her sister and answered all their questions. I discussed the operative procedure with the patient and sister including alternatives, benefits and risks; including but not limited to bleeding, blood transfusion, infection, stroke, myocardial infarction, graft failure, heart block requiring a permanent pacemaker, organ dysfunction, and death.  [name] understands and agrees to proceed.    Plan:  Aortic valve replacement using a bioprosthetic valve and replacement of the ascending aorta and proximal aortic arch with grafts to the innominate and left common carotid artery using deep hypothermic circulatory arrest and antegrade cerebral perfusion, right axillary artery cannulation, bilateral radial arterial lines for monitoring on Thursday, 04/21/2021.  I spent 60 minutes performing this consultation and > 50% of this time was spent face to face counseling and coordinating the care of this patient's severe bicuspid aortic valve stenosis and ascending/arch aortic aneurysm.   Gaye Pollack, MD [facility] [phone]

## 2021-04-15 NOTE — Anesthesia Preprocedure Evaluation (Addendum)
Anesthesia Evaluation  Patient identified by MRN, date of birth, ID band Patient awake    Reviewed: Allergy & Precautions, NPO status , Patient's Chart, lab work & pertinent test results, reviewed documented beta blocker date and time   History of Anesthesia Complications Negative for: history of anesthetic complications  Airway Mallampati: III  TM Distance: >3 FB Neck ROM: Full    Dental  (+) Dental Advisory Given, Teeth Intact   Pulmonary Current Smoker and Patient abstained from smoking.,    Pulmonary exam normal        Cardiovascular hypertension, Pt. on home beta blockers and Pt. on medications + Valvular Problems/Murmurs AS  Rhythm:Regular Rate:Bradycardia   '22 Cath - Nonobstructive CAD  '22 TTE - EF 60 to 65%. Mild left ventricular hypertrophy.  Grade I diastolic dysfunction (impaired relaxation). Left atrial size was mildly dilated. Mild MR. Moderate TR. The aortic valve is bicuspid. Trivial AI. Severe AS. Aortic valve area, by VTI measures 0.50 cm. Aortic valve mean  gradient measures 45.0 mmHg. Aortic valve Vmax measures 4.42 m/s. There is mild dilatation of the ascending aorta, measuring 41 mm.   '22 CT Angio - 4.7cm diameter ascending TAA    Neuro/Psych PSYCHIATRIC DISORDERS Anxiety Depression negative neurological ROS     GI/Hepatic Neg liver ROS, GERD  Controlled,  Endo/Other  negative endocrine ROS  Renal/GU negative Renal ROS     Musculoskeletal  (+) Arthritis ,  Scoliosis    Abdominal   Peds  Hematology negative hematology ROS (+)   Anesthesia Other Findings Covid test negative   Reproductive/Obstetrics                           Anesthesia Physical Anesthesia Plan  ASA: IV  Anesthesia Plan: General   Post-op Pain Management:    Induction: Intravenous  PONV Risk Score and Plan: 2 and Treatment may vary due to age or medical condition  Airway Management  Planned: Oral ETT  Additional Equipment: Arterial line, CVP, PA Cath, TEE and Ultrasound Guidance Line Placement  Intra-op Plan: Utilization Of Total Body Hypothermia per surgeon request and Delibrate Circulatory arrest per surgeon request  Post-operative Plan: Post-operative intubation/ventilation  Informed Consent: I have reviewed the patients History and Physical, chart, labs and discussed the procedure including the risks, benefits and alternatives for the proposed anesthesia with the patient or authorized representative who has indicated his/her understanding and acceptance.     Dental advisory given  Plan Discussed with: CRNA and Anesthesiologist  Anesthesia Plan Comments: (Bilateral radial a-lines)     Anesthesia Quick Evaluation

## 2021-04-18 NOTE — Pre-Procedure Instructions (Signed)
Surgical Instructions    Your procedure is scheduled on Thursday, May 5th.  Report to [facility] Main Entrance "A" at 5:30 A.M., then check in with the Admitting office.  Call this number if you have problems the morning of surgery:  [phone]   If you have any questions prior to your surgery date call [phone]: Open Monday-Friday 8am-4pm    Remember:  Do not eat or drink after midnight the night before your surgery   Take these medicines the morning of surgery with A SIP OF WATER escitalopram (LEXAPRO)  rosuvastatin (CRESTOR)  clonazePAM (KLONOPIN)-as needed  As of today, STOP taking any Aspirin (unless otherwise instructed by your surgeon) Aleve, Naproxen, Ibuprofen, Motrin, Advil, Goody's, BC's, all herbal medications, fish oil, and all vitamins.                     Do NOT Smoke (Tobacco/Vaping) or drink Alcohol 24 hours prior to your procedure.  If you use a CPAP at night, you may bring all equipment for your overnight stay.   Contacts, glasses, piercing's, hearing aid's, dentures or partials may not be worn into surgery, please bring cases for these belongings.    For patients admitted to the hospital, discharge time will be determined by your treatment team.   Patients discharged the day of surgery will not be allowed to drive home, and someone needs to stay with them for 24 hours.    Special instructions:   [place]- Preparing For Surgery  Before surgery, you can play an important role. Because skin is not sterile, your skin needs to be as free of germs as possible. You can reduce the number of germs on your skin by washing with CHG (chlorahexidine gluconate) Soap before surgery.  CHG is an antiseptic cleaner which kills germs and bonds with the skin to continue killing germs even after washing.    Oral Hygiene is also important to reduce your risk of infection.  Remember - BRUSH YOUR TEETH THE MORNING OF SURGERY WITH YOUR REGULAR TOOTHPASTE  Please do not  use if you have an allergy to CHG or antibacterial soaps. If your skin becomes reddened/irritated stop using the CHG.  Do not shave (including legs and underarms) for at least 48 hours prior to first CHG shower. It is OK to shave your face.  Please follow these instructions carefully.   1. Shower the NIGHT BEFORE SURGERY and the MORNING OF SURGERY  2. If you chose to wash your hair, wash your hair first as usual with your normal shampoo.  3. After you shampoo, rinse your hair and body thoroughly to remove the shampoo.  4. Use CHG Soap as you would any other liquid soap. You can apply CHG directly to the skin and wash gently with a scrungie or a clean washcloth.   5. Apply the CHG Soap to your body ONLY FROM THE NECK DOWN.  Do not use on open wounds or open sores. Avoid contact with your eyes, ears, mouth and genitals (private parts). Wash Face and genitals (private parts)  with your normal soap.   6. Wash thoroughly, paying special attention to the area where your surgery will be performed.  7. Thoroughly rinse your body with warm water from the neck down.  8. DO NOT shower/wash with your normal soap after using and rinsing off the CHG Soap.  9. Pat yourself dry with a CLEAN TOWEL.  10. Wear CLEAN PAJAMAS to bed the night before surgery  11.  Place CLEAN SHEETS on your bed the night before your surgery  12. DO NOT SLEEP WITH PETS.   Day of Surgery: Shower with CHG soap. Do not wear jewelry, make up, or nail polish Do not wear lotions, powders, perfumes, or deodorant. Do not shave 48 hours prior to surgery. Do not bring valuables to the hospital. [facility] is not responsible for any belongings or valuables. Wear Clean/Comfortable clothing the morning of surgery Remember to brush your teeth WITH YOUR REGULAR TOOTHPASTE.   Please read over the following fact sheets that you were given.

## 2021-04-19 ENCOUNTER — Encounter (HOSPITAL_COMMUNITY)
Admission: RE | Admit: 2021-04-19 | Discharge: 2021-04-19 | Disposition: A | Discharge status: Home / Self Care | Payer: Medicare Other | Source: Ambulatory Visit | Attending: Surgery | Admitting: Surgery

## 2021-04-19 ENCOUNTER — Ambulatory Visit (HOSPITAL_BASED_OUTPATIENT_CLINIC_OR_DEPARTMENT_OTHER)
Admission: RE | Admit: 2021-04-19 | Discharge: 2021-04-19 | Disposition: A | Discharge status: Home / Self Care | Payer: Medicare Other | Source: Ambulatory Visit | Attending: Surgery | Admitting: Surgery

## 2021-04-19 ENCOUNTER — Ambulatory Visit (HOSPITAL_COMMUNITY)
Admission: RE | Admit: 2021-04-19 | Discharge: 2021-04-19 | Disposition: A | Discharge status: Home / Self Care | Payer: Medicare Other | Source: Ambulatory Visit | Attending: Surgery | Admitting: Surgery

## 2021-04-19 ENCOUNTER — Other Ambulatory Visit: Payer: Self-pay

## 2021-04-19 ENCOUNTER — Ambulatory Visit: Payer: Medicare Other | Admitting: Cardiovascular Disease

## 2021-04-19 ENCOUNTER — Other Ambulatory Visit (HOSPITAL_COMMUNITY)
Admission: RE | Admit: 2021-04-19 | Discharge: 2021-04-19 | Disposition: A | Discharge status: Home / Self Care | Payer: Medicare Other | Source: Ambulatory Visit | Attending: Surgery | Admitting: Surgery

## 2021-04-19 ENCOUNTER — Encounter (HOSPITAL_COMMUNITY): Payer: Self-pay

## 2021-04-19 DIAGNOSIS — Q230 Congenital stenosis of aortic valve: Secondary | ICD-10-CM | POA: Diagnosis not present

## 2021-04-19 DIAGNOSIS — D62 Acute posthemorrhagic anemia: Secondary | ICD-10-CM | POA: Diagnosis not present

## 2021-04-19 DIAGNOSIS — I350 Nonrheumatic aortic (valve) stenosis: Secondary | ICD-10-CM | POA: Insufficient documentation

## 2021-04-19 DIAGNOSIS — Z01818 Encounter for other preprocedural examination: Secondary | ICD-10-CM

## 2021-04-19 DIAGNOSIS — Z20822 Contact with and (suspected) exposure to covid-19: Secondary | ICD-10-CM | POA: Insufficient documentation

## 2021-04-19 DIAGNOSIS — I358 Other nonrheumatic aortic valve disorders: Secondary | ICD-10-CM | POA: Diagnosis not present

## 2021-04-19 DIAGNOSIS — K5730 Diverticulosis of large intestine without perforation or abscess without bleeding: Secondary | ICD-10-CM | POA: Diagnosis not present

## 2021-04-19 DIAGNOSIS — I10 Essential (primary) hypertension: Secondary | ICD-10-CM | POA: Insufficient documentation

## 2021-04-19 DIAGNOSIS — K219 Gastro-esophageal reflux disease without esophagitis: Secondary | ICD-10-CM | POA: Diagnosis not present

## 2021-04-19 DIAGNOSIS — F17210 Nicotine dependence, cigarettes, uncomplicated: Secondary | ICD-10-CM | POA: Diagnosis not present

## 2021-04-19 DIAGNOSIS — Z818 Family history of other mental and behavioral disorders: Secondary | ICD-10-CM | POA: Diagnosis not present

## 2021-04-19 DIAGNOSIS — Z800 Family history of malignant neoplasm of digestive organs: Secondary | ICD-10-CM | POA: Diagnosis not present

## 2021-04-19 DIAGNOSIS — K760 Fatty (change of) liver, not elsewhere classified: Secondary | ICD-10-CM | POA: Diagnosis not present

## 2021-04-19 DIAGNOSIS — I712 Thoracic aortic aneurysm, without rupture, unspecified: Secondary | ICD-10-CM

## 2021-04-19 DIAGNOSIS — Z8249 Family history of ischemic heart disease and other diseases of the circulatory system: Secondary | ICD-10-CM | POA: Diagnosis not present

## 2021-04-19 DIAGNOSIS — J9811 Atelectasis: Secondary | ICD-10-CM | POA: Diagnosis not present

## 2021-04-19 DIAGNOSIS — F32A Depression, unspecified: Secondary | ICD-10-CM | POA: Diagnosis not present

## 2021-04-19 DIAGNOSIS — K5792 Diverticulitis of intestine, part unspecified, without perforation or abscess without bleeding: Secondary | ICD-10-CM | POA: Diagnosis not present

## 2021-04-19 DIAGNOSIS — Q231 Congenital insufficiency of aortic valve: Secondary | ICD-10-CM

## 2021-04-19 DIAGNOSIS — J439 Emphysema, unspecified: Secondary | ICD-10-CM | POA: Diagnosis not present

## 2021-04-19 DIAGNOSIS — M419 Scoliosis, unspecified: Secondary | ICD-10-CM | POA: Diagnosis not present

## 2021-04-19 DIAGNOSIS — I083 Combined rheumatic disorders of mitral, aortic and tricuspid valves: Secondary | ICD-10-CM | POA: Diagnosis not present

## 2021-04-19 DIAGNOSIS — I700 Atherosclerosis of aorta: Secondary | ICD-10-CM | POA: Insufficient documentation

## 2021-04-19 DIAGNOSIS — J90 Pleural effusion, not elsewhere classified: Secondary | ICD-10-CM | POA: Diagnosis not present

## 2021-04-19 DIAGNOSIS — I517 Cardiomegaly: Secondary | ICD-10-CM | POA: Diagnosis not present

## 2021-04-19 DIAGNOSIS — R531 Weakness: Secondary | ICD-10-CM | POA: Diagnosis not present

## 2021-04-19 DIAGNOSIS — Z811 Family history of alcohol abuse and dependence: Secondary | ICD-10-CM | POA: Diagnosis not present

## 2021-04-19 DIAGNOSIS — I352 Nonrheumatic aortic (valve) stenosis with insufficiency: Secondary | ICD-10-CM | POA: Diagnosis not present

## 2021-04-19 DIAGNOSIS — F419 Anxiety disorder, unspecified: Secondary | ICD-10-CM | POA: Diagnosis not present

## 2021-04-19 DIAGNOSIS — Z803 Family history of malignant neoplasm of breast: Secondary | ICD-10-CM | POA: Diagnosis not present

## 2021-04-19 DIAGNOSIS — E785 Hyperlipidemia, unspecified: Secondary | ICD-10-CM | POA: Insufficient documentation

## 2021-04-19 DIAGNOSIS — Z79899 Other long term (current) drug therapy: Secondary | ICD-10-CM | POA: Diagnosis not present

## 2021-04-19 DIAGNOSIS — Z885 Allergy status to narcotic agent status: Secondary | ICD-10-CM | POA: Diagnosis not present

## 2021-04-19 DIAGNOSIS — I2510 Atherosclerotic heart disease of native coronary artery without angina pectoris: Secondary | ICD-10-CM | POA: Diagnosis not present

## 2021-04-19 HISTORY — DX: Pneumonia, unspecified organism: J18.9

## 2021-04-19 LAB — COMPREHENSIVE METABOLIC PANEL
ALT: 16 U/L (ref 0–44)
AST: 21 U/L (ref 15–41)
Albumin: 4.1 g/dL (ref 3.5–5.0)
Alkaline Phosphatase: 80 U/L (ref 38–126)
Anion gap: 9 (ref 5–15)
BUN: 10 mg/dL (ref 8–23)
CO2: 24 mmol/L (ref 22–32)
Calcium: 9.5 mg/dL (ref 8.9–10.3)
Chloride: 103 mmol/L (ref 98–111)
Creatinine, Ser: 0.73 mg/dL (ref 0.44–1.00)
GFR, Estimated: >60 mL/min (ref >60)
Glucose, Bld: 102 mg/dL — ABNORMAL HIGH (ref 70–99)
Potassium: 4.3 mmol/L (ref 3.5–5.1)
Sodium: 136 mmol/L (ref 135–145)
Total Bilirubin: 0.4 mg/dL (ref 0.3–1.2)
Total Protein: 7 g/dL (ref 6.5–8.1)

## 2021-04-19 LAB — PROTIME-INR
INR: 1 (ref 0.8–1.2)
Prothrombin Time: 12.7 seconds (ref 11.4–15.2)

## 2021-04-19 LAB — CBC
HCT: 36.6 % (ref 36.0–46.0)
Hemoglobin: 12.5 g/dL (ref 12.0–15.0)
MCH: 32.9 pg (ref 26.0–34.0)
MCHC: 34.2 g/dL (ref 30.0–36.0)
MCV: 96.3 fL (ref 80.0–100.0)
Platelets: 267 10*3/uL (ref 150–400)
RBC: 3.8 MIL/uL — ABNORMAL LOW (ref 3.87–5.11)
RDW: 13 % (ref 11.5–15.5)
WBC: 6.9 10*3/uL (ref 4.0–10.5)
nRBC: 0 % (ref 0.0–0.2)

## 2021-04-19 LAB — HEMOGLOBIN A1C
Hgb A1c MFr Bld: 5.2 % (ref 4.8–5.6)
Mean Plasma Glucose: 102.54 mg/dL

## 2021-04-19 LAB — SURGICAL PCR SCREEN
MRSA, PCR: NEGATIVE
Staphylococcus aureus: NEGATIVE

## 2021-04-19 LAB — BLOOD GAS, ARTERIAL
Acid-Base Excess: 0.9 mmol/L (ref 0.0–2.0)
Bicarbonate: 24.8 mmol/L (ref 20.0–28.0)
Drawn by: 58793
FIO2: 21
O2 Saturation: 97.9 %
Patient temperature: 37
pCO2 arterial: 37.7 mmHg (ref 32.0–48.0)
pH, Arterial: 7.433 (ref 7.350–7.450)
pO2, Arterial: 91.9 mmHg (ref 83.0–108.0)

## 2021-04-19 LAB — URINALYSIS, ROUTINE W REFLEX MICROSCOPIC
Bilirubin Urine: NEGATIVE
Glucose, UA: NEGATIVE mg/dL
Hgb urine dipstick: NEGATIVE
Ketones, ur: NEGATIVE mg/dL
Leukocytes,Ua: NEGATIVE
Nitrite: NEGATIVE
Protein, ur: NEGATIVE mg/dL
Specific Gravity, Urine: 1.01 (ref 1.005–1.030)
pH: 6 (ref 5.0–8.0)

## 2021-04-19 LAB — APTT: aPTT: 28 seconds (ref 24–36)

## 2021-04-19 NOTE — Progress Notes (Signed)
Pre-AVR ultrasound study completed.   Please see CV Proc for preliminary results.   Darlin Coco, RDMS, RVT

## 2021-04-19 NOTE — Progress Notes (Signed)
PCP - Mable Paris, FNP Cardiologist - Dr. Ida Rogue  Chest x-ray - 04/19/21 EKG - 03/22/21 Stress Test -20+ years ago  ECHO - 03/08/21 Cardiac Cath - 03/24/21  Sleep Study - denies CPAP - denies  Blood Thinner Instructions: n/a Aspirin Instructions: n/a   COVID TEST- 04/19/21; pending. Pt aware to quarantine until surgery.    Anesthesia review: Yes, heart history   Patient denies shortness of breath, fever, cough and chest pain at PAT appointment   All instructions explained to the patient, with a verbal understanding of the material. Patient agrees to go over the instructions while at home for a better understanding. Patient also instructed to self quarantine after being tested for COVID-19. The opportunity to ask questions was provided.

## 2021-04-20 LAB — SARS CORONAVIRUS 2 (TAT 6-24 HRS): SARS Coronavirus 2: NEGATIVE

## 2021-04-20 MED ORDER — VANCOMYCIN HCL 1250 MG/250ML IV SOLN
1250.0000 mg | INTRAVENOUS | Status: AC
  Administered 2021-04-21: 1250 mg via INTRAVENOUS
  Filled 2021-04-20: qty 250

## 2021-04-20 MED ORDER — PHENYLEPHRINE HCL-NACL 20-0.9 MG/250ML-% IV SOLN
30.0000 ug/min | INTRAVENOUS | Status: AC
  Administered 2021-04-21: 10 ug/min via INTRAVENOUS
  Administered 2021-04-21: 20 ug/min via INTRAVENOUS
  Filled 2021-04-20: qty 250

## 2021-04-20 MED ORDER — NITROGLYCERIN IN D5W 200-5 MCG/ML-% IV SOLN
2.0000 ug/min | INTRAVENOUS | Status: DC
  Filled 2021-04-20: qty 250

## 2021-04-20 MED ORDER — CEFAZOLIN SODIUM-DEXTROSE 2-4 GM/100ML-% IV SOLN
2.0000 g | INTRAVENOUS | Status: DC
  Filled 2021-04-20: qty 100

## 2021-04-20 MED ORDER — TRANEXAMIC ACID 1000 MG/10ML IV SOLN
1.5000 mg/kg/h | INTRAVENOUS | Status: AC
  Administered 2021-04-21: 1.5 mg/kg/h via INTRAVENOUS
  Filled 2021-04-20: qty 25

## 2021-04-20 MED ORDER — MILRINONE LACTATE IN DEXTROSE 20-5 MG/100ML-% IV SOLN
0.3000 ug/kg/min | INTRAVENOUS | Status: DC
  Filled 2021-04-20: qty 100

## 2021-04-20 MED ORDER — CEFAZOLIN SODIUM-DEXTROSE 2-4 GM/100ML-% IV SOLN
2.0000 g | INTRAVENOUS | Status: AC
  Administered 2021-04-21 (×2): 2 g via INTRAVENOUS
  Filled 2021-04-20: qty 100

## 2021-04-20 MED ORDER — INSULIN REGULAR(HUMAN) IN NACL 100-0.9 UT/100ML-% IV SOLN
INTRAVENOUS | Status: AC
  Administered 2021-04-21: .7 [IU]/h via INTRAVENOUS
  Filled 2021-04-20: qty 100

## 2021-04-20 MED ORDER — EPINEPHRINE HCL 5 MG/250ML IV SOLN IN NS
0.0000 ug/min | INTRAVENOUS | Status: DC
  Filled 2021-04-20: qty 250

## 2021-04-20 MED ORDER — TRANEXAMIC ACID (OHS) BOLUS VIA INFUSION
15.0000 mg/kg | INTRAVENOUS | Status: AC
  Administered 2021-04-21: 912 mg via INTRAVENOUS
  Filled 2021-04-20: qty 912

## 2021-04-20 MED ORDER — PLASMA-LYTE 148 IV SOLN
INTRAVENOUS | Status: DC
  Filled 2021-04-20: qty 2.5

## 2021-04-20 MED ORDER — MAGNESIUM SULFATE 50 % IJ SOLN
40.0000 meq | INTRAMUSCULAR | Status: DC
  Filled 2021-04-20: qty 9.85

## 2021-04-20 MED ORDER — NOREPINEPHRINE 4 MG/250ML-% IV SOLN
0.0000 ug/min | INTRAVENOUS | Status: DC
  Filled 2021-04-20: qty 250

## 2021-04-20 MED ORDER — POTASSIUM CHLORIDE 2 MEQ/ML IV SOLN
80.0000 meq | INTRAVENOUS | Status: DC
  Filled 2021-04-20: qty 40

## 2021-04-20 MED ORDER — DEXMEDETOMIDINE HCL IN NACL 400 MCG/100ML IV SOLN
0.1000 ug/kg/h | INTRAVENOUS | Status: AC
  Administered 2021-04-21: .3 ug/kg/h via INTRAVENOUS
  Filled 2021-04-20: qty 100

## 2021-04-20 MED ORDER — TRANEXAMIC ACID (OHS) PUMP PRIME SOLUTION
2.0000 mg/kg | INTRAVENOUS | Status: DC
  Filled 2021-04-20: qty 1.22

## 2021-04-20 MED ORDER — SODIUM CHLORIDE 0.9 % IV SOLN
INTRAVENOUS | Status: DC
  Filled 2021-04-20: qty 30

## 2021-04-20 NOTE — H&P (Signed)
[name], [name], [name], MD      Chief Complaint  Patient presents with  . Aortic Stenosis        HPI:  The patient is a 65 year old woman with a history of hypertension, tobacco abuse, anxiety and depression, GERD, scoliosis, and bicuspid aortic valve disease with moderate aortic stenosis and known enlargement of the ascending aorta.  Her last echocardiogram in August 2020 showed a mean gradient across aortic valve of 30 mmHg with a peak gradient of 56 mmHg.  The aortic valve area at that time is 0.84 cm consistent with severe aortic stenosis.  The aortic root was measured at 3.2 cm with an ascending aorta 4.5 cm.  Left ventricular ejection fraction was 60 to 65%.  She was not having any symptoms at that time and was continued on medical treatment with follow-up.  Her most recent echo on 03/08/2021 showed an increase in the mean gradient to 45 mmHg.  There was trivial aortic insufficiency.  There is moderate thickening of a bicuspid aortic valve.  The ascending aorta was measured at 41 mm.  Left ventricular ejection fraction was measured at 60 to 65% with grade 1 diastolic dysfunction and mild left ventricular hypertrophy.  She now reports development of exertional fatigue as well as dizziness and some chest tightness.  She is also under a large amount of emotional stress due to currently undergoing a divorce.  She denies any orthopnea or PND.  She has had no peripheral edema.  She is able to walk about 1/2 mile every day and does not have any symptoms while she is walking.  She does tend to get very fatigued later in the day.  She underwent cardiac catheterization on 03/24/2021 which showed nonobstructive coronary disease.  The aortic valve could not be crossed.  Right heart pressures were normal.  Cardiac index was  2.36.  She underwent a gated cardiac CTA as well as a CTA of the chest, abdomen, and pelvis.  This showed the ascending aorta to be aneurysmal with a diameter of 4.7 cm.  The dilatation extends from the sinotubular junction to the mid aortic arch between the left common carotid and left subclavian arteries.  The maximal sinus diameter is 34 mm.  The patient is currently going through a divorce and moved to [facility] to be closer to her family.  She continues to smoke a few cigarettes per day.     Past Medical History:  Diagnosis Date  . Anxiety   . C. difficile colitis    a. 07/2014.  [name] Congenital bicuspid aortic valve   . Depression   . Dilated Ascending Aorta    a. 12/2013 Echo: 4.45cm.  . Diverticulitis    hospitalized; subsequent c diff infection.   [name] GERD (gastroesophageal reflux disease)   . H/O mastitis 2015  . Heart murmur   . History of tobacco abuse    a. 20 yrs, 1/4 ppd - quit.  [name] HTN (hypertension)   . Hypokalemia   . Moderate to severe aortic stenosis    a. 12/2013 Echo: EF 60-65%, Grade 1 DD, bicuspid AoV with mod-sev AS [29mmHg mean gradient, AoV area 0.69cm^2 (VTI)], mod dil ascending Ao - 4.45cm, mild TR, PASP  36mmHg.  . Multilevel degenerative disc disease   . Scoliosis     Past Surgical History:  Procedure Laterality Date  . BARTHOLIN GLAND CYST EXCISION    . BREAST BIOPSY Left    neg  . COLONOSCOPY  2013   Dr. Servando SnareWohl  . COLONOSCOPY WITH PROPOFOL N/A 10/23/2017   Procedure: COLONOSCOPY WITH PROPOFOL;  Surgeon: Midge MiniumWohl, Darren, MD;  [facility] ENDOSCOPY;  Service: Endoscopy;  Laterality: N/A;  . OOPHORECTOMY  2016  . OVARY SURGERY  2016   cyst on right ovary  . RIGHT/LEFT HEART CATH AND CORONARY ANGIOGRAPHY N/A 03/24/2021   Procedure: RIGHT/LEFT HEART CATH AND CORONARY ANGIOGRAPHY;  Surgeon: Antonieta IbaGollan, Timothy J, MD;  Location: [facility] INVASIVE CV LAB;  Service: Cardiovascular;  Laterality: N/A;         Family History  Problem  Relation Age of Onset  . Hypertension Mother   . Colon cancer Mother   . Anxiety disorder Mother   . Depression Mother   . Heart failure Father   . Hypertension Father   . Depression Sister   . Anxiety disorder Sister   . Alcohol abuse Brother   . Schizophrenia Brother   . Breast cancer Maternal Aunt   . Heart attack Neg Hx   . Stroke Neg Hx     Social History Social History   Tobacco Use  . Smoking status: Light Tobacco Smoker    Packs/day: 0.25    Years: 20.00    Pack years: 5.00    Types: Cigarettes  . Smokeless tobacco: Never Used  Vaping Use  . Vaping Use: Never used  Substance Use Topics  . Alcohol use: Yes    Alcohol/week: 12.0 standard drinks    Types: 10 Glasses of wine, 2 Cans of beer per week    Comment:  2-3 glasses of wine every other evening  . Drug use: No          Current Outpatient Medications  Medication Sig Dispense Refill  . clonazePAM (KLONOPIN) 0.5 MG tablet TAKE 1 TABLET BY MOUTH TWICE A DAY AS NEEDED (Patient taking differently: Take 0.5 mg by mouth 2 (two) times daily.) 60 tablet 1  . escitalopram (LEXAPRO) 10 MG tablet Take 1 tablet (10 mg total) by mouth daily. (Patient taking differently: Take 10 mg by mouth in the morning.) 90 tablet 1  . lisinopril (ZESTRIL) 20 MG tablet Take 1 tablet (20 mg total) by mouth daily. (Patient taking differently: Take 20 mg by mouth in the morning.) 90 tablet 1  . metoprolol succinate (TOPROL-XL) 100 MG 24 hr tablet TAKE 1 TABLET BY MOUTH DAILY. PLEASE CALL OFFICE FOR BP FOLLOW-UP APPT AS MENTIONED IN MAY. (Patient taking differently: Take 100 mg by mouth at bedtime.) 90 tablet 3  . rosuvastatin (CRESTOR) 5 MG tablet Take 1 tablet (5 mg total) by mouth daily. (Patient taking differently: Take 5 mg by mouth in the morning.) 30 tablet 5   No current facility-administered medications for this visit.            Facility-Administered Medications Ordered in Other Visits  Medication  Dose Route Frequency Provider Last Rate Last Admin  . sodium chloride flush (NS) 0.9 % injection 3 mL  3 mL Intravenous Q12H Visser, Jacquelyn D, PA-C            Allergies  Allergen Reactions  . Vicodin [Hydrocodone-Acetaminophen] Hives and Rash    Review of Systems  Constitutional: Positive for fatigue. Negative for activity change and unexpected weight change.  HENT: Negative.        Last saw the dentist 6 months ago  Eyes: Negative.   Respiratory: Positive for chest tightness. Negative for shortness of breath.   Cardiovascular: Positive for chest pain. Negative for palpitations and leg swelling.  Gastrointestinal: Negative.   Endocrine: Negative.   Genitourinary: Negative.   Musculoskeletal: Negative.   Skin: Negative.   Allergic/Immunologic: Negative.   Neurological: Negative for syncope and headaches.  Hematological: Negative.   Psychiatric/Behavioral:       Anxiety and depression, nervousness    BP (!) 170/100   Koch 60   Resp 20   Ht 5\' 2"  (1.575 m)   Wt 130 lb (59 kg)   SpO2 98% Comment: RA  BMI 23.78 kg/m  Physical Exam Constitutional:      Appearance: Normal appearance. She is normal weight.  HENT:     Head: Normocephalic and atraumatic.     Nose: Nose normal.     Mouth/Throat:     Mouth: Mucous membranes are moist.     Pharynx: Oropharynx is clear.  Eyes:     Extraocular Movements: Extraocular movements intact.     Conjunctiva/sclera: Conjunctivae normal.     Pupils: Pupils are equal, round, and reactive to light.  Cardiovascular:     Rate and Rhythm: Normal rate and regular rhythm.     Pulses: Normal pulses.     Heart sounds: Murmur heard.      Comments: 3/6 systolic murmur along the right sternal border.  There is no diastolic murmur. Pulmonary:     Effort: Pulmonary effort is normal.     Breath sounds: Normal breath sounds.  Abdominal:     General: Abdomen is flat. Bowel sounds are normal. There is no distension.     Palpations:  Abdomen is soft.     Tenderness: There is no abdominal tenderness.  Musculoskeletal:        General: No swelling.     Cervical back: Normal range of motion and neck supple.     Comments: Scoliosis  Skin:    General: Skin is warm and dry.  Neurological:     General: No focal deficit present.     Mental Status: She is alert and oriented to person, place, and time.  Psychiatric:        Mood and Affect: Mood normal.        Behavior: Behavior normal.      Diagnostic Tests:  ECHOCARDIOGRAM REPORT       Patient Name:  Deborah Koch Date of Exam: 03/08/2021  Medical Rec #: [id number]      Height:    62.5 in  Accession #:  [id number]      Weight:    131.4 lb  Date of Birth: June 16, 1956      BSA:     1.609 m  Patient Age:  58 years       BP:      120/80 mmHg  Patient Gender: F          HR:      75 bpm.  Exam Location: [place]   Procedure: 2D Echo, 3D Echo, Cardiac Doppler and Color Doppler   Indications:  I35.2 Nonrheumatic aortic (valve) stenosis with  insufficiency    History:    Patient has prior history of Echocardiogram examinations,  most         recent 07/30/2019. Aortic Valve Disease; Risk         Factors:Hypertension.    Sonographer:  Geradine Girt  Referring Phys: [place]    1. Left ventricular ejection fraction, by estimation, is 60 to 65%. The  left ventricle has normal function. The left ventricle has no regional  wall motion abnormalities. There is mild left ventricular hypertrophy.  Left ventricular diastolic parameters  are consistent with Grade I diastolic dysfunction (impaired relaxation).  2. Right ventricular systolic function is normal. The right ventricular  size is normal. There is normal pulmonary artery systolic pressure.  3. Left atrial size was mildly dilated.  4. The mitral valve is normal in structure. Mild  mitral valve  regurgitation. No evidence of mitral stenosis.  5. Tricuspid valve regurgitation is moderate.  6. The aortic valve is bicuspid. There is moderate thickening of the  aortic valve. Aortic valve regurgitation is trivial. Severe aortic valve  stenosis. Aortic valve area, by VTI measures 0.50 cm. Aortic valve mean  gradient measures 45.0 mmHg. Aortic  valve Vmax measures 4.42 m/s.  7. Aortic dilatation noted. There is mild dilatation of the ascending  aorta, measuring 41 mm.  8. The inferior vena cava is normal in size with greater than 50%  respiratory variability, suggesting right atrial pressure of 3 mmHg.   FINDINGS  Left Ventricle: Left ventricular ejection fraction, by estimation, is 60  to 65%. The left ventricle has normal function. The left ventricle has no  regional wall motion abnormalities. The left ventricular internal cavity  size was normal in size. There is  mild left ventricular hypertrophy. Left ventricular diastolic parameters  are consistent with Grade I diastolic dysfunction (impaired relaxation).   Right Ventricle: The right ventricular size is normal. No increase in  right ventricular wall thickness. Right ventricular systolic function is  normal. There is normal pulmonary artery systolic pressure. The tricuspid  regurgitant velocity is 2.82 m/s, and  with an assumed right atrial pressure of 3 mmHg, the estimated right  ventricular systolic pressure is [id number].   Left Atrium: Left atrial size was mildly dilated.   Right Atrium: Right atrial size was normal in size.   Pericardium: There is no evidence of pericardial effusion.   Mitral Valve: The mitral valve is normal in structure. Mild mitral valve  regurgitation. No evidence of mitral valve stenosis.   Tricuspid Valve: The tricuspid valve is normal in structure. Tricuspid  valve regurgitation is moderate.   Aortic Valve: The aortic valve is bicuspid. There is moderate thickening  of  the aortic valve. Aortic valve regurgitation is trivial. Aortic  regurgitation PHT measures 726 msec. Severe aortic stenosis is present.  Aortic valve mean gradient measures 45.0  mmHg. Aortic valve peak gradient measures 78.1 mmHg. Aortic valve area, by  VTI measures 0.50 cm.   Pulmonic Valve: The pulmonic valve was not well visualized. Pulmonic valve  regurgitation is trivial. No evidence of pulmonic stenosis.   Aorta: Aortic dilatation noted. There is mild dilatation of the ascending  aorta, measuring 41 mm.   Pulmonary Artery: The pulmonary artery is not well seen.   Venous: The inferior vena cava is normal in size with greater than 50%  respiratory variability, suggesting right atrial pressure of 3 mmHg.   IAS/Shunts: No atrial level shunt detected by color flow Doppler.     LEFT VENTRICLE  PLAX 2D  LVIDd:     4.40 cm Diastology  LVIDs:     2.60 cm LV e' medial:  5.55 cm/s  LV PW:     1.00 cm LV E/e' medial:  14.8  LV IVS:    1.10 cm LV e' lateral:  7.18 cm/s  LVOT diam:   1.90 cm LV E/e' lateral: 11.5  LV SV:     54  LV SV Index:  33  LVOT Area:   2.84 cm               3D Volume EF:             3D EF:    65 %             LV EDV:    111 ml             LV ESV:    39 ml             LV SV:    72 ml   RIGHT VENTRICLE  RV S prime:   9.90 cm/s  TAPSE (M-mode): 2.8 cm   LEFT ATRIUM       Index    RIGHT ATRIUM      Index  LA diam:    3.30 cm 2.05 cm/m RA Area:   15.10 cm  LA Vol (A2C):  32.3 ml 20.08 ml/m RA Volume:  35.20 ml 21.88 ml/m  LA Vol (A4C):  33.2 ml 20.64 ml/m  LA Biplane Vol: 34.0 ml 21.14 ml/m  AORTIC VALVE  AV Area (Vmax):  0.47 cm  AV Area (Vmean):  0.43 cm  AV Area (VTI):   0.50 cm  AV Vmax:      442.00 cm/s  AV Vmean:     305.333 cm/s  AV VTI:      1.070 m  AV Peak Grad:    78.1 mmHg  AV Mean Grad:   45.0 mmHg  LVOT Vmax:     73.10 cm/s  LVOT Vmean:    46.800 cm/s  LVOT VTI:     0.190 m  LVOT/AV VTI ratio: 0.18  AI PHT:      726 msec    AORTA  Ao Root diam: 2.70 cm  Ao Asc diam: 4.10 cm   MITRAL VALVE        TRICUSPID VALVE  MV Area (PHT): 2.41 cm  TR Peak grad:  31.8 mmHg  MV Decel Time: 315 msec  TR Vmax:    282.00 cm/s  MV E velocity: 82.30 cm/s  MV A velocity: 97.70 cm/s SHUNTS  MV E/A ratio: 0.84    Systemic VTI: 0.19 m               Systemic Diam: 1.90 cm   Nelva Bush MD  Electronically signed by Nelva Bush MD  Signature Date/Time: 03/08/2021/6:11:04 PM     Physicians  Panel Physicians Referring Physician Case Authorizing Physician  Minna Merritts, MD (Primary)      Procedures  RIGHT/LEFT HEART CATH AND CORONARY ANGIOGRAPHY   Indications  Severe aortic stenosis [I35.0 (ICD-10-CM)]   Procedural Details  Technical Details Cardiac Catheterization Procedure Note  Name: Deborah Koch MRN: [id number] DOB: 01/04/1956  Procedure: Left Heart Cath, Selective Coronary Angiography, LV angiography, right heart catheterization  Indication: 65 year old woman with history of bicuspid aortic valve, prior smoking history, dilated ascending aorta, echocardiogram demonstrating severe aortic valve stenosis Severe aortic valve stenosis. Aortic valve area, by VTI measures 0.50 cm. Aortic valve mean  gradient measures 45.0 mmHg. Aortic valve Vmax measures 4.42 m/s.  She presents today for preoperative evaluation for severe aortic valve stenosis, dilated ascending aorta   Procedural details: The right groin  was prepped, draped, and anesthetized with 1% lidocaine. Using modified Seldinger technique, a 5 French sheath was introduced into the right femoral artery. Standard Judkins catheters (JL 5, JR 4 and pigtail catheter, straight wire were used in attempt  to cross the aortic valve) were used for coronary angiography and left ventriculography. 7 French sheath placed to right femoral vein for right heart catheter pressures using Swan-Ganz catheter. catheter exchanges were performed over a guidewire. There were no immediate procedural complications. The patient was transferred to the post catheterization recovery area for further monitoring.  Moderate sedation: 1. Sedation used: 2 mg versed, 75 ug fentanyl IV push 2. Time of administration: 9:30 AM Time patient left for recovery 10 AM Total sedation time 30 minutes 3. I was Face to Face with the patient during this time: (code: [phone])   Procedural Findings:   Coronary angiography:  Coronary dominance: Right  Left mainstem: Large vessel that bifurcates into the LAD and left circumflex, no significant disease noted  Left anterior descending (LAD): Large vessel that extends to the apical region, diagonal branch 2 of moderate size, no significant disease noted  Left circumflex (LCx): Large vessel with OM branch 2, no significant disease noted  Right coronary artery (RCA): Right dominant vessel with PL and PDA, no significant disease noted  Left ventriculography: Unable to cross the aortic valve secondary to aortic valve stenosis, straight wire used, multiple attempts, versa core wire used, unsuccessful crossing  Right heart catheterization pressures Wedge 7 PA 27/9/18 RV 28/1/7 RA 5 Ao 140/77/75 Cardiac output 3.76, cardiac index 2.36  Final Conclusions:  Nonobstructive coronary disease Unable to cross aortic valve secondary to aortic valve stenosis Normal right heart pressures  Recommendations:  We will discuss case with TAVR team in [place] Significant scoliosis,  will likely need CT given dilated aorta TEE could be performed for better visualization of the valve  Ida Rogue 03/24/2021, 9:58 AM  Estimated blood loss <50 mL.   During this procedure  medications were administered to achieve and maintain moderate conscious sedation while the patient's heart rate, blood pressure, and oxygen saturation were continuously monitored and I was present face-to-face 100% of this time.   Medications (Filter: Administrations occurring from [phone] to 1001 on 03/24/21) (important) Continuous medications are totaled by the amount administered until 03/24/21 1001.    fentaNYL (SUBLIMAZE) injection (mcg) Total dose:  75 mcg  Date/Time Rate/Dose/Volume Action   03/24/21 0902 25 mcg Given   0904 25 mcg Given   0917 25 mcg Given    midazolam (VERSED) injection (mg) Total dose:  2 mg  Date/Time Rate/Dose/Volume Action   03/24/21 0902 1 mg Given   0904 0.5 mg Given   0917 0.5 mg Given    Heparin (Porcine) in NaCl 1000-0.9 UT/500ML-% SOLN (mL) Total volume:  1,000 mL  Date/Time Rate/Dose/Volume Action   03/24/21 0948 1,000 mL Given    iohexol (OMNIPAQUE) 300 MG/ML solution (mL) Total volume:  55 mL  Date/Time Rate/Dose/Volume Action   03/24/21 0949 55 mL Given    Sedation Time  Sedation Time Physician-1: 41 minutes 55 seconds   Contrast  Medication Name Total Dose  iohexol (OMNIPAQUE) 300 MG/ML solution 55 mL    Radiation/Fluoro  Fluoro time: 15.6 (min) DAP: 24.7 (Gycm2) Cumulative Air Kerma: 307 (mGy)   Coronary Findings   Diagnostic Dominance: Right  No diagnostic findings have been documented.  Intervention   No interventions have been documented.  Coronary Diagrams   Diagnostic Dominance: Right  Intervention    Implants       Vascular Products   Device Closure Mynxgrip 30f [phone] - Implanted  Inventory item: DEVICE CLOSURE MYNXGRIP 21F Model/Cat number: [id number]  Manufacturer: [employer] Lot number: [id number]  Device identifier: [id number] Device identifier type: GS1  Area Of Implantation: Groin     GUDID  Information  Request status Successful    Brand name: [facility] Version/Model: [id number]  Company name: [employer]. MRI safety info as of 03/24/21: MR Safe  Contains dry or latex rubber: No    GMDN P.T. name: Wound hydrogel dressing, non-antimicrobial     As of 03/24/2021  Status: Implanted       Syngo Images  Show images for CARDIAC CATHETERIZATION  Images on Long Term Storage  Show images for Deborah, Koch "Deborah Koch"  Link to Procedure Log  Procedure Log     Hemo Data (last day) before discharge  AO Systolic Cath Pressure AO Diastolic Cath Pressure AO Mean Cath Pressure PA Systolic Cath Pressure PA Diastolic Cath Pressure PA Mean Cath Pressure RA Wedge A Wave RA Wedge V Wave RV Systolic Cath Pressure RV Diastolic Cath Pressure RV End Diastolic RV Systolic RV End Diastolic RV dP/dt PCW A Wave PCW V Wave PCW Mean AO O2 Sat PA O2 Sat AO O2 Sat Fick C.O. Fick C.I.  -- -- -- -- -- -- 8 mmHg 5 mmHg -- -- -- 28 mmHg 7 mmHg 240 mmHg/sec 10 mmHg 9 mmHg 7 mmHg -- -- -- 3.76 L/min 2.36 L/min/m2  -- -- -- 20 mmHg 8 mmHg 14 mmHg -- -- -- -- -- -- -- -- -- -- -- -- -- -- -- --  -- -- -- 27 mmHg 9 mmHg 18 mmHg -- -- -- -- -- -- -- -- -- -- -- -- -- -- -- --  -- -- -- -- -- -- -- -- 28 mmHg 1 mmHg 7 mmHg -- -- -- -- -- -- -- -- -- -- --  140 77 mmHg 75 mmHg -- -- -- -- -- -- -- -- -- -- -- -- -- -- -- -- -- -- --  -- -- -- -- -- -- -- -- -- -- -- -- -- -- -- -- -- 94.8 % -- SA -- --  -- -- -- -- -- -- -- -- -- -- --               ADDENDUM REPORT: 04/11/2021 12:23  CLINICAL DATA: Severe Aortic Stenosis.  EXAM: Cardiac TAVR CT  TECHNIQUE: The patient was scanned on a [employer]. A 90 kV retrospective scan was triggered in the descending thoracic aorta at 111 HU's. Gantry rotation speed was 250 msecs and collimation was .6 mm. No beta blockade or nitro were given. The 3D data set was reconstructed in 5% intervals of the R-R  cycle. Systolic and diastolic phases were analyzed on a dedicated work station using MPR, MIP and VRT modes. The patient received 100 cc of contrast.  FINDINGS: Image quality: Excellent.  Noise artifact is: Limited.  Valve Morphology: The aortic valve is a 2-sinus bicuspid valve with latero-lateral configuration. There is no raphe. The leaflets are severely thickened with restricted movement in systole. There is bulky calcification noted in the anterior commissure.  Aortic Valve Calcium score: 635  Aortic annular dimension:  Phase assessed: 10%  Annular area: 423 mm2  Annular perimeter: 73.2 mm  Max diameter: 24.1 mm  Min diameter: 22.7 mm  Annular and subannular calcification:  Minimal annular calcification under the anterior commissure.  Optimal coplanar projection: LAO 4 CRA 10  Coronary Artery Height above Annulus:  Left Main: 16.9 mm  Right Coronary: 12.0 mm  Sinus of Valsalva Measurements:  Inter-commissure: 28 mm  Maximal diameter: 34 mm  Sinus of Valsalva Height:  Right lateral: 16.6 mm  Left lateral: 22.9 mm  Sinotubular Junction: 36 mm  Ascending Thoracic Aorta: Aneurysmal up to 47 mm (double-oblique).  Coronary Arteries: Normal coronary origin. Right dominance. The study was performed without use of NTG and is insufficient for plaque evaluation. Please refer to recent cardiac catheterization for coronary assessment. 3-vessel coronary calcifications noted.  Cardiac Morphology:  Right Atrium: Right atrial size is within normal limits.  Right Ventricle: The right ventricular cavity is within normal limits.  Left Atrium: Left atrial size is normal in size with no left atrial appendage filling defect.  Left Ventricle: The ventricular cavity size is within normal limits. There are no stigmata of prior infarction. There is no abnormal filling defect. Normal left ventricular function, LVEF 72%. No regional wall  motion abnormalities.  Pulmonary arteries: Normal in size without proximal filling defect.  Pulmonary veins: Normal pulmonary venous drainage.  Pericardium: Normal thickness with no significant effusion or calcium present.  Mitral Valve: The mitral valve is normal structure without significant calcification.  Extra-cardiac findings: See attached radiology report for non-cardiac structures.  IMPRESSION: 1. The aortic valve is a 2-sinus bicuspid valve with latero-lateral configuration without raphe. Bulky calcification noted in the anterior commissure.  2. Annular measurements appropriate for 23 mm Sapien 3 TAVR (423 mm2).  3. Minimal annular calcification noted.  4. Sufficient coronary to annulus distance.  5. Optimal Fluoroscopic Angle for Delivery: LAO 4 CRA 10  6. Aneurysm of the ascending aorta up to 47 mm (double-oblique). Tortuous descending aorta noted.  7. 3-vessel coronary calcifications.  [place] T. Audie Box, MD   Electronically Signed By: Eleonore Chiquito On: 04/11/2021 12:23   CLINICAL DATA: 65 year old female with history of severe aortic stenosis. Preprocedural study prior to potential transcatheter aortic valve replacement (TAVR) procedure.  EXAM: CT CHEST, ABDOMEN AND PELVIS WITHOUT CONTRAST  TECHNIQUE: Multidetector CT imaging of the chest, abdomen and pelvis was performed following the standard protocol without IV contrast.  COMPARISON: CT the abdomen and pelvis 12/26/2016. No prior chest CT.  FINDINGS: CTA CHEST FINDINGS  Cardiovascular: Heart size is normal. There is no significant pericardial fluid, thickening or pericardial calcification. There is aortic atherosclerosis, as well as atherosclerosis of the great vessels of the mediastinum and the coronary arteries, including calcified atherosclerotic plaque in the left anterior descending and right coronary arteries. Severe thickening calcification of  the aortic valve. Aneurysmal dilatation of the ascending thoracic aorta which measures up to 4.7 cm in diameter.  Mediastinum/Lymph Nodes: No pathologically enlarged mediastinal or hilar lymph nodes. Esophagus is unremarkable in appearance. No axillary lymphadenopathy.  Lungs/Pleura: No acute consolidative airspace disease. No pleural effusions. 4 mm right upper lobe pulmonary nodule (axial image 43 of series 5). No other larger more suspicious appearing pulmonary nodules or masses are noted.  Musculoskeletal/Soft Tissues: Severe dextroscoliosis of the thoracolumbar spine. There are no aggressive appearing lytic or blastic lesions noted in the visualized portions of the skeleton.  CTA ABDOMEN AND PELVIS FINDINGS  Hepatobiliary: Diffuse low attenuation throughout the hepatic parenchyma, indicative of a background of hepatic steatosis. No suspicious cystic or solid hepatic lesions. No intra or extrahepatic biliary ductal dilatation. Gallbladder is normal in appearance.  Pancreas: No pancreatic mass. No  pancreatic ductal dilatation. No pancreatic or peripancreatic fluid collections or inflammatory changes.  Spleen: Unremarkable.  Adrenals/Urinary Tract: Bilateral kidneys and adrenal glands are normal in appearance. No hydroureteronephrosis. Urinary bladder is normal in appearance.  Stomach/Bowel: Normal appearance of the stomach. No pathologic dilatation of small bowel or colon. Numerous colonic diverticulae are noted, particularly in the sigmoid colon, without surrounding inflammatory changes to suggest an acute diverticulitis at this time. Normal appendix.  Vascular/Lymphatic: Aortic atherosclerosis, without evidence of aneurysm or dissection in the abdominal or pelvic vasculature. Vascular findings and measurements pertinent to potential TAVR procedure, as detailed below. High attenuation soft tissue stranding adjacent to the right common femoral artery and  proximal right superficial femoral artery, likely to reflect a small postprocedural hematoma. No lymphadenopathy noted in the abdomen or pelvis.  Reproductive: Uterus and ovaries are atrophic.  Other: No significant volume of ascites. No pneumoperitoneum.  Musculoskeletal: There are no aggressive appearing lytic or blastic lesions noted in the visualized portions of the skeleton.  VASCULAR MEASUREMENTS PERTINENT TO TAVR:  AORTA:  Minimal Aortic Diameter-14 x 12 mm  Severity of Aortic Calcification-mild to moderate  RIGHT PELVIS:  Right Common Iliac Artery -  Minimal Diameter-7.1 x 5.9 mm  Tortuosity-mild  Calcification-mild  Right External Iliac Artery -  Minimal Diameter-5.8 x 5.4 mm  Tortuosity-mild  Calcification-none  Right Common Femoral Artery -  Minimal Diameter-6.6 x 4.7 mm  Tortuosity-mild  Calcification-mild  LEFT PELVIS:  Left Common Iliac Artery -  Minimal Diameter-7.0 x 8.3 mm  Tortuosity-mild  Calcification-mild  Left External Iliac Artery -  Minimal Diameter-5.8 x 5.8 mm  Tortuosity-moderate  Calcification-none  Left Common Femoral Artery -  Minimal Diameter-6.0 x 6.0 mm  Tortuosity-mild  Calcification-none  Review of the MIP images confirms the above findings.  IMPRESSION: 1. Vascular findings and measurements pertinent to potential TAVR procedure, as detailed above. 2. Severe thickening calcification of the aortic valve, the appearance of which suggests probable bicuspid valve, compatible with reported clinical history of severe aortic stenosis. 3. Aortic atherosclerosis with 2 vessel coronary artery disease and aneurysmal dilatation of the ascending thoracic aorta (4.7 cm in diameter). Ascending thoracic aortic aneurysm. Recommend semi-annual imaging followup by CTA or MRA and referral to cardiothoracic surgery if not already obtained. This recommendation follows  2010 ACCF/AHA/AATS/ACR/ASA/SCA/SCAI/SIR/STS/SVM Guidelines for the Diagnosis and Management of Patients With Thoracic Aortic Disease. Circulation. 2010; [id number]:4928372. Aortic aneurysm NOS (ICD10-I71.9). 4. 4 mm right upper lobe pulmonary nodule, nonspecific, but statistically likely benign. No follow-up needed if patient is low-risk. Non-contrast chest CT can be considered in 12 months if patient is high-risk. This recommendation follows the consensus statement: Guidelines for Management of Incidental Pulmonary Nodules Detected on CT Images: From the Fleischner Society 2017; Radiology 2017; 284:228-243. 5. Severe colonic diverticulosis without evidence of acute diverticulitis at this time. 6. Hepatic steatosis. 7. Additional incidental findings, as above.   Electronically Signed By: Vinnie Langton M.D. On: 04/12/2021 06:02   Impression:  This 65 year old woman has severe bicuspid aortic valve stenosis with a 4.7 cm fusiform ascending aortic aneurysm.  She has New York Heart Association class II symptoms of exertional fatigue consistent with chronic diastolic congestive heart failure as well as some positional dizziness and intermittent chest discomfort at rest.  I have personally reviewed her 2D echocardiogram, cardiac catheterization, and CTA studies.  Her echocardiogram shows a bicuspid aortic valve with thickening and restricted leaflet motion.  Her mean gradient is increased from 30 to 45 mmHg over the past year.  Left  ventricular systolic function is normal.  CTA of the chest shows a 4.7 cm fusiform ascending aortic aneurysm that extends from the level of the sinotubular junction up to the proximal aortic arch.  The aorta adjacent to the innominate artery is still 4.1 cm and does not return to normal until just beyond the left common carotid artery.  Cardiac catheterization shows nonobstructive coronary disease.  I think the best treatment for her is aortic valve replacement  using a bioprosthetic valve and replacement of the ascending aorta and proximal aortic arch from the level of the sinotubular junction to the mid aortic arch between the left common carotid and left subclavian arteries.  This will require right axillary artery cannulation and circulatory arrest.  She will require bilateral radial arterial lines for blood pressure monitoring.  The sinuses of Valsalva are fairly normal and I do not think she requires aortic root replacement.  I reviewed all the studies with her and her sister and answered all their questions. I discussed the operative procedure with the patient and sister including alternatives, benefits and risks; including but not limited to bleeding, blood transfusion, infection, stroke, myocardial infarction, graft failure, heart block requiring a permanent pacemaker, organ dysfunction, and death.  Deborah Koch understands and agrees to proceed.    Plan:  Aortic valve replacement using a bioprosthetic valve and replacement of the ascending aorta and proximal aortic arch with grafts to the innominate and left common carotid artery using deep hypothermic circulatory arrest and antegrade cerebral perfusion, right axillary artery cannulation, bilateral radial arterial lines for monitoring on Thursday, 04/21/2021.    Gaye Pollack, MD [facility] [phone]

## 2021-04-21 ENCOUNTER — Other Ambulatory Visit: Payer: Self-pay

## 2021-04-21 ENCOUNTER — Inpatient Hospital Stay (HOSPITAL_COMMUNITY): Payer: Medicare Other | Admitting: Vascular Surgery

## 2021-04-21 ENCOUNTER — Inpatient Hospital Stay (HOSPITAL_COMMUNITY): Payer: Medicare Other

## 2021-04-21 ENCOUNTER — Inpatient Hospital Stay (HOSPITAL_COMMUNITY)
Admission: RE | Admit: 2021-04-21 | Discharge: 2021-04-27 | DRG: 220 | Disposition: A | Discharge status: Home / Self Care | Payer: Medicare Other | Attending: Surgery | Admitting: Surgery

## 2021-04-21 ENCOUNTER — Encounter (HOSPITAL_COMMUNITY): Payer: Self-pay | Admitting: Surgery

## 2021-04-21 ENCOUNTER — Encounter (HOSPITAL_COMMUNITY)
Admission: RE | Disposition: A | Discharge status: Home / Self Care | Payer: Self-pay | Source: Home / Self Care | Attending: Surgery

## 2021-04-21 DIAGNOSIS — K5792 Diverticulitis of intestine, part unspecified, without perforation or abscess without bleeding: Secondary | ICD-10-CM | POA: Diagnosis present

## 2021-04-21 DIAGNOSIS — K760 Fatty (change of) liver, not elsewhere classified: Secondary | ICD-10-CM | POA: Diagnosis present

## 2021-04-21 DIAGNOSIS — Z803 Family history of malignant neoplasm of breast: Secondary | ICD-10-CM | POA: Diagnosis not present

## 2021-04-21 DIAGNOSIS — R739 Hyperglycemia, unspecified: Secondary | ICD-10-CM | POA: Diagnosis present

## 2021-04-21 DIAGNOSIS — I2510 Atherosclerotic heart disease of native coronary artery without angina pectoris: Secondary | ICD-10-CM | POA: Diagnosis present

## 2021-04-21 DIAGNOSIS — J90 Pleural effusion, not elsewhere classified: Secondary | ICD-10-CM | POA: Diagnosis not present

## 2021-04-21 DIAGNOSIS — M419 Scoliosis, unspecified: Secondary | ICD-10-CM | POA: Diagnosis present

## 2021-04-21 DIAGNOSIS — Z8249 Family history of ischemic heart disease and other diseases of the circulatory system: Secondary | ICD-10-CM

## 2021-04-21 DIAGNOSIS — F32A Depression, unspecified: Secondary | ICD-10-CM | POA: Diagnosis present

## 2021-04-21 DIAGNOSIS — I712 Thoracic aortic aneurysm, without rupture, unspecified: Secondary | ICD-10-CM

## 2021-04-21 DIAGNOSIS — I959 Hypotension, unspecified: Secondary | ICD-10-CM | POA: Diagnosis not present

## 2021-04-21 DIAGNOSIS — Z811 Family history of alcohol abuse and dependence: Secondary | ICD-10-CM

## 2021-04-21 DIAGNOSIS — T380X5A Adverse effect of glucocorticoids and synthetic analogues, initial encounter: Secondary | ICD-10-CM | POA: Diagnosis present

## 2021-04-21 DIAGNOSIS — I10 Essential (primary) hypertension: Secondary | ICD-10-CM | POA: Diagnosis present

## 2021-04-21 DIAGNOSIS — F17210 Nicotine dependence, cigarettes, uncomplicated: Secondary | ICD-10-CM | POA: Diagnosis present

## 2021-04-21 DIAGNOSIS — Z79899 Other long term (current) drug therapy: Secondary | ICD-10-CM | POA: Diagnosis not present

## 2021-04-21 DIAGNOSIS — Z09 Encounter for follow-up examination after completed treatment for conditions other than malignant neoplasm: Secondary | ICD-10-CM

## 2021-04-21 DIAGNOSIS — I083 Combined rheumatic disorders of mitral, aortic and tricuspid valves: Principal | ICD-10-CM | POA: Diagnosis present

## 2021-04-21 DIAGNOSIS — Z9889 Other specified postprocedural states: Secondary | ICD-10-CM

## 2021-04-21 DIAGNOSIS — K219 Gastro-esophageal reflux disease without esophagitis: Secondary | ICD-10-CM | POA: Diagnosis present

## 2021-04-21 DIAGNOSIS — Z8679 Personal history of other diseases of the circulatory system: Secondary | ICD-10-CM

## 2021-04-21 DIAGNOSIS — Z952 Presence of prosthetic heart valve: Secondary | ICD-10-CM

## 2021-04-21 DIAGNOSIS — Q230 Congenital stenosis of aortic valve: Secondary | ICD-10-CM

## 2021-04-21 DIAGNOSIS — F419 Anxiety disorder, unspecified: Secondary | ICD-10-CM | POA: Diagnosis present

## 2021-04-21 DIAGNOSIS — I700 Atherosclerosis of aorta: Secondary | ICD-10-CM | POA: Diagnosis present

## 2021-04-21 DIAGNOSIS — D62 Acute posthemorrhagic anemia: Secondary | ICD-10-CM | POA: Diagnosis not present

## 2021-04-21 DIAGNOSIS — Z20822 Contact with and (suspected) exposure to covid-19: Secondary | ICD-10-CM | POA: Diagnosis present

## 2021-04-21 DIAGNOSIS — K5730 Diverticulosis of large intestine without perforation or abscess without bleeding: Secondary | ICD-10-CM | POA: Diagnosis present

## 2021-04-21 DIAGNOSIS — R0902 Hypoxemia: Secondary | ICD-10-CM | POA: Diagnosis not present

## 2021-04-21 DIAGNOSIS — Z800 Family history of malignant neoplasm of digestive organs: Secondary | ICD-10-CM | POA: Diagnosis not present

## 2021-04-21 DIAGNOSIS — Z885 Allergy status to narcotic agent status: Secondary | ICD-10-CM

## 2021-04-21 DIAGNOSIS — Z818 Family history of other mental and behavioral disorders: Secondary | ICD-10-CM | POA: Diagnosis not present

## 2021-04-21 DIAGNOSIS — I350 Nonrheumatic aortic (valve) stenosis: Secondary | ICD-10-CM | POA: Diagnosis not present

## 2021-04-21 DIAGNOSIS — Q231 Congenital insufficiency of aortic valve: Secondary | ICD-10-CM

## 2021-04-21 DIAGNOSIS — J9811 Atelectasis: Secondary | ICD-10-CM | POA: Diagnosis not present

## 2021-04-21 DIAGNOSIS — I493 Ventricular premature depolarization: Secondary | ICD-10-CM | POA: Diagnosis not present

## 2021-04-21 HISTORY — PX: AORTIC VALVE REPLACEMENT: SHX41

## 2021-04-21 HISTORY — PX: AORTA -INNOMIATE BYPASS: SHX5723

## 2021-04-21 HISTORY — PX: TEE WITHOUT CARDIOVERSION: SHX5443

## 2021-04-21 HISTORY — PX: REPLACEMENT ASCENDING AORTA: SHX6068

## 2021-04-21 HISTORY — PX: CAROTID-SUBCLAVIAN BYPASS GRAFT: SHX910

## 2021-04-21 LAB — HEMOGLOBIN AND HEMATOCRIT, BLOOD
HCT: 27.4 % — ABNORMAL LOW (ref 36.0–46.0)
Hemoglobin: 9.5 g/dL — ABNORMAL LOW (ref 12.0–15.0)

## 2021-04-21 LAB — POCT I-STAT 7, (LYTES, BLD GAS, ICA,H+H)
Acid-Base Excess: 2 mmol/L (ref 0.0–2.0)
Acid-Base Excess: 4 mmol/L — ABNORMAL HIGH (ref 0.0–2.0)
Acid-Base Excess: 2 mmol/L (ref 0.0–2.0)
Acid-Base Excess: 2 mmol/L (ref 0.0–2.0)
Acid-Base Excess: 1 mmol/L (ref 0.0–2.0)
Acid-base deficit: 2 mmol/L (ref 0.0–2.0)
Bicarbonate: 27.5 mmol/L (ref 20.0–28.0)
Bicarbonate: 29.3 mmol/L — ABNORMAL HIGH (ref 20.0–28.0)
Bicarbonate: 23.1 mmol/L (ref 20.0–28.0)
Bicarbonate: 25.8 mmol/L (ref 20.0–28.0)
Bicarbonate: 25.8 mmol/L (ref 20.0–28.0)
Bicarbonate: 23.9 mmol/L (ref 20.0–28.0)
Calcium, Ion: 1.26 mmol/L (ref 1.15–1.40)
Calcium, Ion: 0.97 mmol/L — ABNORMAL LOW (ref 1.15–1.40)
Calcium, Ion: 0.93 mmol/L — ABNORMAL LOW (ref 1.15–1.40)
Calcium, Ion: 0.86 mmol/L — CL (ref 1.15–1.40)
Calcium, Ion: 0.85 mmol/L — CL (ref 1.15–1.40)
Calcium, Ion: 1.07 mmol/L — ABNORMAL LOW (ref 1.15–1.40)
HCT: 32 % — ABNORMAL LOW (ref 36.0–46.0)
HCT: 26 % — ABNORMAL LOW (ref 36.0–46.0)
HCT: 21 % — ABNORMAL LOW (ref 36.0–46.0)
HCT: 26 % — ABNORMAL LOW (ref 36.0–46.0)
HCT: 22 % — ABNORMAL LOW (ref 36.0–46.0)
HCT: 26 % — ABNORMAL LOW (ref 36.0–46.0)
Hemoglobin: 10.9 g/dL — ABNORMAL LOW (ref 12.0–15.0)
Hemoglobin: 8.8 g/dL — ABNORMAL LOW (ref 12.0–15.0)
Hemoglobin: 7.1 g/dL — ABNORMAL LOW (ref 12.0–15.0)
Hemoglobin: 8.8 g/dL — ABNORMAL LOW (ref 12.0–15.0)
Hemoglobin: 7.5 g/dL — ABNORMAL LOW (ref 12.0–15.0)
Hemoglobin: 8.8 g/dL — ABNORMAL LOW (ref 12.0–15.0)
O2 Saturation: 100 %
O2 Saturation: 100 %
O2 Saturation: 100 %
O2 Saturation: 100 %
O2 Saturation: 100 %
O2 Saturation: 100 %
Potassium: 4.2 mmol/L (ref 3.5–5.1)
Potassium: 3.8 mmol/L (ref 3.5–5.1)
Potassium: 6.8 mmol/L (ref 3.5–5.1)
Potassium: 5.8 mmol/L — ABNORMAL HIGH (ref 3.5–5.1)
Potassium: 5.6 mmol/L — ABNORMAL HIGH (ref 3.5–5.1)
Potassium: 4.6 mmol/L (ref 3.5–5.1)
Sodium: 140 mmol/L (ref 135–145)
Sodium: 140 mmol/L (ref 135–145)
Sodium: 136 mmol/L (ref 135–145)
Sodium: 138 mmol/L (ref 135–145)
Sodium: 139 mmol/L (ref 135–145)
Sodium: 141 mmol/L (ref 135–145)
TCO2: 29 mmol/L (ref 22–32)
TCO2: 31 mmol/L (ref 22–32)
TCO2: 24 mmol/L (ref 22–32)
TCO2: 27 mmol/L (ref 22–32)
TCO2: 27 mmol/L (ref 22–32)
TCO2: 25 mmol/L (ref 22–32)
pCO2 arterial: 45 mmHg (ref 32.0–48.0)
pCO2 arterial: 47.7 mmHg (ref 32.0–48.0)
pCO2 arterial: 41.9 mmHg (ref 32.0–48.0)
pCO2 arterial: 34.1 mmHg (ref 32.0–48.0)
pCO2 arterial: 36.7 mmHg (ref 32.0–48.0)
pCO2 arterial: 31.9 mmHg — ABNORMAL LOW (ref 32.0–48.0)
pH, Arterial: 7.394 (ref 7.350–7.450)
pH, Arterial: 7.397 (ref 7.350–7.450)
pH, Arterial: 7.35 (ref 7.350–7.450)
pH, Arterial: 7.487 — ABNORMAL HIGH (ref 7.350–7.450)
pH, Arterial: 7.455 — ABNORMAL HIGH (ref 7.350–7.450)
pH, Arterial: 7.482 — ABNORMAL HIGH (ref 7.350–7.450)
pO2, Arterial: 485 mmHg — ABNORMAL HIGH (ref 83.0–108.0)
pO2, Arterial: 497 mmHg — ABNORMAL HIGH (ref 83.0–108.0)
pO2, Arterial: 240 mmHg — ABNORMAL HIGH (ref 83.0–108.0)
pO2, Arterial: 386 mmHg — ABNORMAL HIGH (ref 83.0–108.0)
pO2, Arterial: 370 mmHg — ABNORMAL HIGH (ref 83.0–108.0)
pO2, Arterial: 159 mmHg — ABNORMAL HIGH (ref 83.0–108.0)

## 2021-04-21 LAB — POCT I-STAT, CHEM 8
BUN: 11 mg/dL (ref 8–23)
BUN: 10 mg/dL (ref 8–23)
BUN: 8 mg/dL (ref 8–23)
BUN: 10 mg/dL (ref 8–23)
BUN: 9 mg/dL (ref 8–23)
BUN: 9 mg/dL (ref 8–23)
Calcium, Ion: 1.29 mmol/L (ref 1.15–1.40)
Calcium, Ion: 1.22 mmol/L (ref 1.15–1.40)
Calcium, Ion: 0.92 mmol/L — ABNORMAL LOW (ref 1.15–1.40)
Calcium, Ion: 0.92 mmol/L — ABNORMAL LOW (ref 1.15–1.40)
Calcium, Ion: 0.9 mmol/L — ABNORMAL LOW (ref 1.15–1.40)
Calcium, Ion: 1.07 mmol/L — ABNORMAL LOW (ref 1.15–1.40)
Chloride: 106 mmol/L (ref 98–111)
Chloride: 106 mmol/L (ref 98–111)
Chloride: 102 mmol/L (ref 98–111)
Chloride: 104 mmol/L (ref 98–111)
Chloride: 103 mmol/L (ref 98–111)
Chloride: 106 mmol/L (ref 98–111)
Creatinine, Ser: 0.5 mg/dL (ref 0.44–1.00)
Creatinine, Ser: 0.4 mg/dL — ABNORMAL LOW (ref 0.44–1.00)
Creatinine, Ser: 0.4 mg/dL — ABNORMAL LOW (ref 0.44–1.00)
Creatinine, Ser: 0.4 mg/dL — ABNORMAL LOW (ref 0.44–1.00)
Creatinine, Ser: 0.3 mg/dL — ABNORMAL LOW (ref 0.44–1.00)
Creatinine, Ser: 0.4 mg/dL — ABNORMAL LOW (ref 0.44–1.00)
Glucose, Bld: 103 mg/dL — ABNORMAL HIGH (ref 70–99)
Glucose, Bld: 113 mg/dL — ABNORMAL HIGH (ref 70–99)
Glucose, Bld: 101 mg/dL — ABNORMAL HIGH (ref 70–99)
Glucose, Bld: 135 mg/dL — ABNORMAL HIGH (ref 70–99)
Glucose, Bld: 115 mg/dL — ABNORMAL HIGH (ref 70–99)
Glucose, Bld: 130 mg/dL — ABNORMAL HIGH (ref 70–99)
HCT: 31 % — ABNORMAL LOW (ref 36.0–46.0)
HCT: 27 % — ABNORMAL LOW (ref 36.0–46.0)
HCT: 29 % — ABNORMAL LOW (ref 36.0–46.0)
HCT: 30 % — ABNORMAL LOW (ref 36.0–46.0)
HCT: 26 % — ABNORMAL LOW (ref 36.0–46.0)
HCT: 27 % — ABNORMAL LOW (ref 36.0–46.0)
Hemoglobin: 10.5 g/dL — ABNORMAL LOW (ref 12.0–15.0)
Hemoglobin: 9.2 g/dL — ABNORMAL LOW (ref 12.0–15.0)
Hemoglobin: 9.9 g/dL — ABNORMAL LOW (ref 12.0–15.0)
Hemoglobin: 10.2 g/dL — ABNORMAL LOW (ref 12.0–15.0)
Hemoglobin: 8.8 g/dL — ABNORMAL LOW (ref 12.0–15.0)
Hemoglobin: 9.2 g/dL — ABNORMAL LOW (ref 12.0–15.0)
Potassium: 4.2 mmol/L (ref 3.5–5.1)
Potassium: 3.9 mmol/L (ref 3.5–5.1)
Potassium: 3.8 mmol/L (ref 3.5–5.1)
Potassium: 5.8 mmol/L — ABNORMAL HIGH (ref 3.5–5.1)
Potassium: 5.8 mmol/L — ABNORMAL HIGH (ref 3.5–5.1)
Potassium: 4.6 mmol/L (ref 3.5–5.1)
Sodium: 140 mmol/L (ref 135–145)
Sodium: 139 mmol/L (ref 135–145)
Sodium: 139 mmol/L (ref 135–145)
Sodium: 136 mmol/L (ref 135–145)
Sodium: 139 mmol/L (ref 135–145)
Sodium: 141 mmol/L (ref 135–145)
TCO2: 27 mmol/L (ref 22–32)
TCO2: 23 mmol/L (ref 22–32)
TCO2: 24 mmol/L (ref 22–32)
TCO2: 26 mmol/L (ref 22–32)
TCO2: 28 mmol/L (ref 22–32)
TCO2: 24 mmol/L (ref 22–32)

## 2021-04-21 LAB — PREPARE RBC (CROSSMATCH)

## 2021-04-21 LAB — GLUCOSE, CAPILLARY
Glucose-Capillary: 121 mg/dL — ABNORMAL HIGH (ref 70–99)
Glucose-Capillary: 114 mg/dL — ABNORMAL HIGH (ref 70–99)
Glucose-Capillary: 126 mg/dL — ABNORMAL HIGH (ref 70–99)
Glucose-Capillary: 136 mg/dL — ABNORMAL HIGH (ref 70–99)
Glucose-Capillary: 120 mg/dL — ABNORMAL HIGH (ref 70–99)

## 2021-04-21 LAB — COMPREHENSIVE METABOLIC PANEL
ALT: 13 U/L (ref 0–44)
AST: 34 U/L (ref 15–41)
Albumin: 3.7 g/dL (ref 3.5–5.0)
Alkaline Phosphatase: 29 U/L — ABNORMAL LOW (ref 38–126)
Anion gap: 3 — ABNORMAL LOW (ref 5–15)
BUN: 13 mg/dL (ref 8–23)
CO2: 25 mmol/L (ref 22–32)
Calcium: 7.5 mg/dL — ABNORMAL LOW (ref 8.9–10.3)
Chloride: 113 mmol/L — ABNORMAL HIGH (ref 98–111)
Creatinine, Ser: 0.62 mg/dL (ref 0.44–1.00)
GFR, Estimated: >60 mL/min (ref >60)
Glucose, Bld: 116 mg/dL — ABNORMAL HIGH (ref 70–99)
Potassium: 4.4 mmol/L (ref 3.5–5.1)
Sodium: 141 mmol/L (ref 135–145)
Total Bilirubin: 1.4 mg/dL — ABNORMAL HIGH (ref 0.3–1.2)
Total Protein: 5.1 g/dL — ABNORMAL LOW (ref 6.5–8.1)

## 2021-04-21 LAB — CBC
HCT: 24.7 % — ABNORMAL LOW (ref 36.0–46.0)
HCT: 26.4 % — ABNORMAL LOW (ref 36.0–46.0)
Hemoglobin: 8.3 g/dL — ABNORMAL LOW (ref 12.0–15.0)
Hemoglobin: 8.9 g/dL — ABNORMAL LOW (ref 12.0–15.0)
MCH: 31.4 pg (ref 26.0–34.0)
MCH: 30.3 pg (ref 26.0–34.0)
MCHC: 33.6 g/dL (ref 30.0–36.0)
MCHC: 33.7 g/dL (ref 30.0–36.0)
MCV: 93.6 fL (ref 80.0–100.0)
MCV: 89.8 fL (ref 80.0–100.0)
Platelets: 129 10*3/uL — ABNORMAL LOW (ref 150–400)
Platelets: 143 10*3/uL — ABNORMAL LOW (ref 150–400)
RBC: 2.64 MIL/uL — ABNORMAL LOW (ref 3.87–5.11)
RBC: 2.94 MIL/uL — ABNORMAL LOW (ref 3.87–5.11)
RDW: 15.9 % — ABNORMAL HIGH (ref 11.5–15.5)
RDW: 17.3 % — ABNORMAL HIGH (ref 11.5–15.5)
WBC: 6.9 10*3/uL (ref 4.0–10.5)
WBC: 8.4 10*3/uL (ref 4.0–10.5)
nRBC: 0 % (ref 0.0–0.2)
nRBC: 0 % (ref 0.0–0.2)

## 2021-04-21 LAB — POCT I-STAT EG7
Acid-Base Excess: 1 mmol/L (ref 0.0–2.0)
Bicarbonate: 26.2 mmol/L (ref 20.0–28.0)
Calcium, Ion: 1.01 mmol/L — ABNORMAL LOW (ref 1.15–1.40)
HCT: 28 % — ABNORMAL LOW (ref 36.0–46.0)
Hemoglobin: 9.5 g/dL — ABNORMAL LOW (ref 12.0–15.0)
O2 Saturation: 98 %
Potassium: 3.7 mmol/L (ref 3.5–5.1)
Sodium: 140 mmol/L (ref 135–145)
TCO2: 28 mmol/L (ref 22–32)
pCO2, Ven: 43.7 mmHg — ABNORMAL LOW (ref 44.0–60.0)
pH, Ven: 7.387 (ref 7.250–7.430)
pO2, Ven: 116 mmHg — ABNORMAL HIGH (ref 32.0–45.0)

## 2021-04-21 LAB — PROTIME-INR
INR: 1.5 — ABNORMAL HIGH (ref 0.8–1.2)
Prothrombin Time: 18.2 seconds — ABNORMAL HIGH (ref 11.4–15.2)

## 2021-04-21 LAB — ECHO INTRAOPERATIVE TEE
AV Mean grad: 35 mmHg
Height: 62 in
Weight: 2144.99 oz

## 2021-04-21 LAB — PLATELET COUNT: Platelets: 112 10*3/uL — ABNORMAL LOW (ref 150–400)

## 2021-04-21 LAB — MAGNESIUM: Magnesium: 3.2 mg/dL — ABNORMAL HIGH (ref 1.7–2.4)

## 2021-04-21 LAB — FIBRINOGEN: Fibrinogen: 131 mg/dL — ABNORMAL LOW (ref 210–475)

## 2021-04-21 LAB — APTT: aPTT: 36 seconds (ref 24–36)

## 2021-04-21 SURGERY — REPLACEMENT, AORTIC VALVE, OPEN
Anesthesia: General | Site: Chest

## 2021-04-21 MED ORDER — CEFAZOLIN SODIUM 1 G IJ SOLR
INTRAMUSCULAR | Status: AC
  Filled 2021-04-21: qty 20

## 2021-04-21 MED ORDER — CHLORHEXIDINE GLUCONATE CLOTH 2 % EX PADS
6.0000 | MEDICATED_PAD | Freq: Every day | CUTANEOUS | Status: DC
  Administered 2021-04-23 – 2021-04-24 (×2): 6 via TOPICAL

## 2021-04-21 MED ORDER — PROPOFOL 10 MG/ML IV BOLUS
INTRAVENOUS | Status: DC | PRN
  Administered 2021-04-21: 30 mg via INTRAVENOUS
  Administered 2021-04-21: 50 mg via INTRAVENOUS
  Administered 2021-04-21: 30 mg via INTRAVENOUS

## 2021-04-21 MED ORDER — SODIUM CHLORIDE 0.9% IV SOLUTION: Freq: Once | INTRAVENOUS | Status: AC

## 2021-04-21 MED ORDER — LACTATED RINGERS IV SOLN: INTRAVENOUS | Status: DC | PRN

## 2021-04-21 MED ORDER — LACTATED RINGERS IV SOLN: INTRAVENOUS | Status: DC

## 2021-04-21 MED ORDER — HEPARIN SODIUM (PORCINE) 1000 UNIT/ML IJ SOLN
INTRAMUSCULAR | Status: DC | PRN
  Administered 2021-04-21: 22000 [IU] via INTRAVENOUS

## 2021-04-21 MED ORDER — THROMBIN 20000 UNITS EX SOLR
CUTANEOUS | Status: DC | PRN
  Administered 2021-04-21: 10 mL via TOPICAL

## 2021-04-21 MED ORDER — ACETAMINOPHEN 160 MG/5ML PO SOLN
1000.0000 mg | Freq: Four times a day (QID) | ORAL | Status: DC
  Administered 2021-04-22: 1000 mg
  Filled 2021-04-21: qty 40.6

## 2021-04-21 MED ORDER — CHLORHEXIDINE GLUCONATE 0.12 % MT SOLN
15.0000 mL | OROMUCOSAL | Status: AC
  Administered 2021-04-21: 15 mL via OROMUCOSAL

## 2021-04-21 MED ORDER — SODIUM CHLORIDE 0.9% FLUSH
10.0000 mL | Freq: Two times a day (BID) | INTRAVENOUS | Status: DC
  Administered 2021-04-21 – 2021-04-25 (×5): 10 mL

## 2021-04-21 MED ORDER — CHLORHEXIDINE GLUCONATE 4 % EX LIQD: 30.0000 mL | CUTANEOUS | Status: DC

## 2021-04-21 MED ORDER — METOPROLOL TARTRATE 25 MG/10 ML ORAL SUSPENSION
12.5000 mg | Freq: Two times a day (BID) | ORAL | Status: DC
  Administered 2021-04-21: 12.5 mg

## 2021-04-21 MED ORDER — FENTANYL CITRATE (PF) 100 MCG/2ML IJ SOLN
50.0000 ug | INTRAMUSCULAR | Status: DC | PRN
  Administered 2021-04-21: 100 ug via INTRAVENOUS
  Administered 2021-04-24: 50 ug via INTRAVENOUS
  Administered 2021-04-25: 100 ug via INTRAVENOUS
  Filled 2021-04-21 (×3): qty 2

## 2021-04-21 MED ORDER — CHLORHEXIDINE GLUCONATE 0.12 % MT SOLN
15.0000 mL | Freq: Once | OROMUCOSAL | Status: AC
Start: 2021-04-22 — End: 2021-04-21

## 2021-04-21 MED ORDER — ACETAMINOPHEN 500 MG PO TABS
1000.0000 mg | ORAL_TABLET | Freq: Four times a day (QID) | ORAL | Status: AC
  Administered 2021-04-22 – 2021-04-26 (×16): 1000 mg via ORAL
  Filled 2021-04-21 (×17): qty 2

## 2021-04-21 MED ORDER — PHENYLEPHRINE 40 MCG/ML (10ML) SYRINGE FOR IV PUSH (FOR BLOOD PRESSURE SUPPORT)
PREFILLED_SYRINGE | INTRAVENOUS | Status: AC
  Filled 2021-04-21: qty 10

## 2021-04-21 MED ORDER — ARTIFICIAL TEARS OPHTHALMIC OINT
TOPICAL_OINTMENT | OPHTHALMIC | Status: DC | PRN
  Administered 2021-04-21: 1 via OPHTHALMIC

## 2021-04-21 MED ORDER — FENTANYL CITRATE (PF) 100 MCG/2ML IJ SOLN
INTRAMUSCULAR | Status: DC | PRN
  Administered 2021-04-21: 150 ug via INTRAVENOUS
  Administered 2021-04-21: 250 ug via INTRAVENOUS
  Administered 2021-04-21 (×3): 50 ug via INTRAVENOUS
  Administered 2021-04-21: 100 ug via INTRAVENOUS
  Administered 2021-04-21: 250 ug via INTRAVENOUS
  Administered 2021-04-21: 50 ug via INTRAVENOUS
  Administered 2021-04-21: 100 ug via INTRAVENOUS
  Administered 2021-04-21: 150 ug via INTRAVENOUS
  Administered 2021-04-21: 50 ug via INTRAVENOUS

## 2021-04-21 MED ORDER — DEXMEDETOMIDINE HCL IN NACL 400 MCG/100ML IV SOLN: 0.0000 ug/kg/h | INTRAVENOUS | Status: DC

## 2021-04-21 MED ORDER — NITROGLYCERIN IN D5W 200-5 MCG/ML-% IV SOLN
0.0000 ug/min | INTRAVENOUS | Status: DC
  Administered 2021-04-21: 10 ug/min via INTRAVENOUS
  Administered 2021-04-22: 80 ug/min via INTRAVENOUS
  Filled 2021-04-21: qty 250

## 2021-04-21 MED ORDER — ASPIRIN 81 MG PO CHEW: 324.0000 mg | CHEWABLE_TABLET | Freq: Every day | ORAL | Status: DC

## 2021-04-21 MED ORDER — SODIUM CHLORIDE (PF) 0.9 % IJ SOLN
INTRAMUSCULAR | Status: AC
  Filled 2021-04-21: qty 10

## 2021-04-21 MED ORDER — MAGNESIUM SULFATE 4 GM/100ML IV SOLN
4.0000 g | Freq: Once | INTRAVENOUS | Status: AC
  Administered 2021-04-21: 4 g via INTRAVENOUS
  Filled 2021-04-21: qty 100

## 2021-04-21 MED ORDER — METOPROLOL TARTRATE 5 MG/5ML IV SOLN
2.5000 mg | INTRAVENOUS | Status: DC | PRN
Start: 2021-04-21 — End: 2021-04-27
  Administered 2021-04-24 – 2021-04-25 (×2): 5 mg via INTRAVENOUS
  Filled 2021-04-21 (×2): qty 5

## 2021-04-21 MED ORDER — ALBUMIN HUMAN 5 % IV SOLN
250.0000 mL | INTRAVENOUS | Status: DC | PRN
  Administered 2021-04-21 (×2): 12.5 g via INTRAVENOUS
  Filled 2021-04-21: qty 250

## 2021-04-21 MED ORDER — PROTAMINE SULFATE 10 MG/ML IV SOLN
INTRAVENOUS | Status: DC | PRN
  Administered 2021-04-21: 220 mg via INTRAVENOUS

## 2021-04-21 MED ORDER — ACETAMINOPHEN 160 MG/5ML PO SOLN: 650.0000 mg | Freq: Once | ORAL | Status: AC

## 2021-04-21 MED ORDER — SODIUM CHLORIDE 0.45 % IV SOLN: INTRAVENOUS | Status: DC | PRN

## 2021-04-21 MED ORDER — PROPOFOL 10 MG/ML IV BOLUS
INTRAVENOUS | Status: AC
  Filled 2021-04-21: qty 20

## 2021-04-21 MED ORDER — ROCURONIUM BROMIDE 10 MG/ML (PF) SYRINGE
PREFILLED_SYRINGE | INTRAVENOUS | Status: AC
  Filled 2021-04-21: qty 10

## 2021-04-21 MED ORDER — METOPROLOL TARTRATE 12.5 MG HALF TABLET
12.5000 mg | ORAL_TABLET | Freq: Two times a day (BID) | ORAL | Status: DC
  Administered 2021-04-22 (×2): 12.5 mg via ORAL
  Filled 2021-04-21 (×3): qty 1

## 2021-04-21 MED ORDER — LIDOCAINE 2% (20 MG/ML) 5 ML SYRINGE
INTRAMUSCULAR | Status: AC
  Filled 2021-04-21: qty 5

## 2021-04-21 MED ORDER — THROMBIN (RECOMBINANT) 20000 UNITS EX SOLR
CUTANEOUS | Status: AC
  Filled 2021-04-21: qty 20000

## 2021-04-21 MED ORDER — SODIUM CHLORIDE 0.9 % IV SOLN: INTRAVENOUS | Status: DC | PRN

## 2021-04-21 MED ORDER — SODIUM CHLORIDE 0.9 % IV SOLN: INTRAVENOUS | Status: DC

## 2021-04-21 MED ORDER — CHLORHEXIDINE GLUCONATE 0.12 % MT SOLN
OROMUCOSAL | Status: AC
  Administered 2021-04-21: 15 mL via OROMUCOSAL
  Filled 2021-04-21: qty 15

## 2021-04-21 MED ORDER — SODIUM CHLORIDE 0.9 % IV SOLN: 250.0000 mL | INTRAVENOUS | Status: DC

## 2021-04-21 MED ORDER — ONDANSETRON HCL 4 MG/2ML IJ SOLN
4.0000 mg | Freq: Four times a day (QID) | INTRAMUSCULAR | Status: DC | PRN
  Administered 2021-04-22 – 2021-04-25 (×4): 4 mg via INTRAVENOUS
  Filled 2021-04-21 (×4): qty 2

## 2021-04-21 MED ORDER — PROTAMINE SULFATE 10 MG/ML IV SOLN
INTRAVENOUS | Status: AC
  Filled 2021-04-21: qty 25

## 2021-04-21 MED ORDER — 0.9 % SODIUM CHLORIDE (POUR BTL) OPTIME
TOPICAL | Status: DC | PRN
  Administered 2021-04-21: 5000 mL

## 2021-04-21 MED ORDER — GLYCOPYRROLATE PF 0.2 MG/ML IJ SOSY
PREFILLED_SYRINGE | INTRAMUSCULAR | Status: AC
  Filled 2021-04-21: qty 1

## 2021-04-21 MED ORDER — BISACODYL 5 MG PO TBEC
10.0000 mg | DELAYED_RELEASE_TABLET | Freq: Every day | ORAL | Status: DC
  Administered 2021-04-22 – 2021-04-23 (×2): 10 mg via ORAL
  Filled 2021-04-21 (×4): qty 2

## 2021-04-21 MED ORDER — FENTANYL CITRATE (PF) 250 MCG/5ML IJ SOLN
INTRAMUSCULAR | Status: AC
  Filled 2021-04-21: qty 25

## 2021-04-21 MED ORDER — ASPIRIN EC 325 MG PO TBEC
325.0000 mg | DELAYED_RELEASE_TABLET | Freq: Every day | ORAL | Status: DC
  Administered 2021-04-22 – 2021-04-27 (×6): 325 mg via ORAL
  Filled 2021-04-21 (×6): qty 1

## 2021-04-21 MED ORDER — DOCUSATE SODIUM 100 MG PO CAPS
200.0000 mg | ORAL_CAPSULE | Freq: Every day | ORAL | Status: DC
  Administered 2021-04-22 – 2021-04-23 (×2): 200 mg via ORAL
  Filled 2021-04-21 (×5): qty 2

## 2021-04-21 MED ORDER — INSULIN REGULAR(HUMAN) IN NACL 100-0.9 UT/100ML-% IV SOLN: INTRAVENOUS | Status: DC

## 2021-04-21 MED ORDER — METOPROLOL TARTRATE 12.5 MG HALF TABLET
12.5000 mg | ORAL_TABLET | Freq: Once | ORAL | Status: DC
Start: 2021-04-21 — End: 2021-04-21

## 2021-04-21 MED ORDER — MIDAZOLAM HCL (PF) 10 MG/2ML IJ SOLN
INTRAMUSCULAR | Status: AC
  Filled 2021-04-21: qty 2

## 2021-04-21 MED ORDER — MIDAZOLAM HCL (PF) 5 MG/ML IJ SOLN
INTRAMUSCULAR | Status: DC | PRN
  Administered 2021-04-21: 2 mg via INTRAVENOUS
  Administered 2021-04-21: 5 mg via INTRAVENOUS
  Administered 2021-04-21: 1 mg via INTRAVENOUS

## 2021-04-21 MED ORDER — TRAMADOL HCL 50 MG PO TABS
50.0000 mg | ORAL_TABLET | ORAL | Status: DC | PRN
  Administered 2021-04-22: 50 mg via ORAL
  Filled 2021-04-21: qty 1

## 2021-04-21 MED ORDER — CHLORHEXIDINE GLUCONATE 0.12% ORAL RINSE (MEDLINE KIT)
15.0000 mL | Freq: Two times a day (BID) | OROMUCOSAL | Status: DC
  Administered 2021-04-21 – 2021-04-26 (×10): 15 mL via OROMUCOSAL

## 2021-04-21 MED ORDER — MIDAZOLAM HCL 2 MG/2ML IJ SOLN: 2.0000 mg | INTRAMUSCULAR | Status: DC | PRN

## 2021-04-21 MED ORDER — SODIUM CHLORIDE 0.9% FLUSH
3.0000 mL | Freq: Two times a day (BID) | INTRAVENOUS | Status: DC
  Administered 2021-04-22 – 2021-04-26 (×8): 3 mL via INTRAVENOUS

## 2021-04-21 MED ORDER — HEPARIN SODIUM (PORCINE) 1000 UNIT/ML IJ SOLN
INTRAMUSCULAR | Status: AC
  Filled 2021-04-21: qty 1

## 2021-04-21 MED ORDER — SODIUM CHLORIDE 0.9% FLUSH: 10.0000 mL | INTRAVENOUS | Status: DC | PRN

## 2021-04-21 MED ORDER — CEFAZOLIN SODIUM-DEXTROSE 2-4 GM/100ML-% IV SOLN
2.0000 g | Freq: Three times a day (TID) | INTRAVENOUS | Status: AC
  Administered 2021-04-21 – 2021-04-23 (×6): 2 g via INTRAVENOUS
  Filled 2021-04-21 (×7): qty 100

## 2021-04-21 MED ORDER — PHENYLEPHRINE HCL-NACL 10-0.9 MG/250ML-% IV SOLN
INTRAVENOUS | Status: DC | PRN
  Administered 2021-04-21: 10 ug/min via INTRAVENOUS

## 2021-04-21 MED ORDER — PLASMA-LYTE 148 IV SOLN
INTRAVENOUS | Status: DC | PRN
  Administered 2021-04-21: 500 mL via INTRAVASCULAR

## 2021-04-21 MED ORDER — DEXMEDETOMIDINE HCL IN NACL 400 MCG/100ML IV SOLN
0.1000 ug/kg/h | INTRAVENOUS | Status: DC
  Filled 2021-04-21: qty 100

## 2021-04-21 MED ORDER — BISACODYL 10 MG RE SUPP: 10.0000 mg | Freq: Every day | RECTAL | Status: DC

## 2021-04-21 MED ORDER — FAMOTIDINE IN NACL 20-0.9 MG/50ML-% IV SOLN
20.0000 mg | Freq: Two times a day (BID) | INTRAVENOUS | Status: AC
  Administered 2021-04-21 (×2): 20 mg via INTRAVENOUS
  Filled 2021-04-21 (×2): qty 50

## 2021-04-21 MED ORDER — ALBUMIN HUMAN 5 % IV SOLN: INTRAVENOUS | Status: DC | PRN

## 2021-04-21 MED ORDER — METHYLPREDNISOLONE SODIUM SUCC 125 MG IJ SOLR
125.0000 mg | INTRAMUSCULAR | Status: DC
  Filled 2021-04-21: qty 2

## 2021-04-21 MED ORDER — DEXTROSE 50 % IV SOLN: 0.0000 mL | INTRAVENOUS | Status: DC | PRN

## 2021-04-21 MED ORDER — POTASSIUM CHLORIDE 10 MEQ/50ML IV SOLN
10.0000 meq | INTRAVENOUS | Status: AC
  Administered 2021-04-21 (×3): 10 meq via INTRAVENOUS

## 2021-04-21 MED ORDER — ROCURONIUM BROMIDE 10 MG/ML (PF) SYRINGE
PREFILLED_SYRINGE | INTRAVENOUS | Status: DC | PRN
  Administered 2021-04-21: 100 mg via INTRAVENOUS
  Administered 2021-04-21 (×2): 50 mg via INTRAVENOUS

## 2021-04-21 MED ORDER — METOPROLOL TARTRATE 12.5 MG HALF TABLET
ORAL_TABLET | ORAL | Status: AC
  Filled 2021-04-21: qty 1

## 2021-04-21 MED ORDER — HEMOSTATIC AGENTS (NO CHARGE) OPTIME
TOPICAL | Status: DC | PRN
  Administered 2021-04-21 (×2): 1 via TOPICAL

## 2021-04-21 MED ORDER — ACETAMINOPHEN 650 MG RE SUPP
650.0000 mg | Freq: Once | RECTAL | Status: AC
  Administered 2021-04-21: 650 mg via RECTAL

## 2021-04-21 MED ORDER — VANCOMYCIN HCL IN DEXTROSE 1-5 GM/200ML-% IV SOLN
1000.0000 mg | Freq: Once | INTRAVENOUS | Status: AC
  Administered 2021-04-21: 1000 mg via INTRAVENOUS
  Filled 2021-04-21: qty 200

## 2021-04-21 MED ORDER — PANTOPRAZOLE SODIUM 40 MG PO TBEC
40.0000 mg | DELAYED_RELEASE_TABLET | Freq: Every day | ORAL | Status: DC
  Administered 2021-04-23 – 2021-04-27 (×4): 40 mg via ORAL
  Filled 2021-04-21 (×5): qty 1

## 2021-04-21 MED ORDER — METHYLPREDNISOLONE SODIUM SUCC 125 MG IJ SOLR
INTRAMUSCULAR | Status: DC | PRN
  Administered 2021-04-21: 125 mg via INTRAVENOUS

## 2021-04-21 MED ORDER — PHENYLEPHRINE HCL-NACL 20-0.9 MG/250ML-% IV SOLN: 0.0000 ug/min | INTRAVENOUS | Status: DC

## 2021-04-21 MED ORDER — CHLORHEXIDINE GLUCONATE CLOTH 2 % EX PADS
6.0000 | MEDICATED_PAD | Freq: Every day | CUTANEOUS | Status: DC
  Administered 2021-04-21 – 2021-04-22 (×2): 6 via TOPICAL

## 2021-04-21 MED ORDER — ORAL CARE MOUTH RINSE
15.0000 mL | OROMUCOSAL | Status: DC
  Administered 2021-04-21 – 2021-04-22 (×5): 15 mL via OROMUCOSAL

## 2021-04-21 MED ORDER — SODIUM CHLORIDE 0.9% FLUSH
3.0000 mL | INTRAVENOUS | Status: DC | PRN
  Administered 2021-04-24: 3 mL via INTRAVENOUS

## 2021-04-21 MED ORDER — PHENYLEPHRINE HCL-NACL 10-0.9 MG/250ML-% IV SOLN: INTRAVENOUS | Status: DC | PRN

## 2021-04-21 MED ORDER — LACTATED RINGERS IV SOLN: 500.0000 mL | Freq: Once | INTRAVENOUS | Status: DC | PRN

## 2021-04-21 MED ORDER — PHENYLEPHRINE 40 MCG/ML (10ML) SYRINGE FOR IV PUSH (FOR BLOOD PRESSURE SUPPORT)
PREFILLED_SYRINGE | INTRAVENOUS | Status: DC | PRN
  Administered 2021-04-21: 80 ug via INTRAVENOUS

## 2021-04-21 MED ORDER — PHENYLEPHRINE HCL (PRESSORS) 10 MG/ML IV SOLN
INTRAVENOUS | Status: DC | PRN
  Administered 2021-04-21 (×2): 20 ug via INTRAVENOUS

## 2021-04-21 SURGICAL SUPPLY — 106 items
ADAPTER CARDIO PERF ANTE/RETRO (ADAPTER) ×8 IMPLANT
APPLICATOR TIP COSEAL (VASCULAR PRODUCTS) ×8 IMPLANT
BAG DECANTER FOR FLEXI CONT (MISCELLANEOUS) ×4 IMPLANT
BLADE CLIPPER SURG (BLADE) ×4 IMPLANT
BLADE STERNUM SYSTEM 6 (BLADE) ×4 IMPLANT
BLADE SURG 15 STRL LF DISP TIS (BLADE) ×3 IMPLANT
BLADE SURG 15 STRL SS (BLADE) ×1
CANISTER SUCT 3000ML PPV (MISCELLANEOUS) ×4 IMPLANT
CANNULA GUNDRY RCSP 15FR (MISCELLANEOUS) ×8 IMPLANT
CANNULA SNGLE STGE 40FR VENOUS (CANNULA) ×4 IMPLANT
CANNULA SUMP PERICARDIAL (CANNULA) ×4 IMPLANT
CATH HEART VENT LEFT (CATHETERS) ×6 IMPLANT
CATH ROBINSON RED A/P 18FR (CATHETERS) ×12 IMPLANT
CATH THORACIC 36FR (CATHETERS) ×4 IMPLANT
CATH THORACIC 36FR RT ANG (CATHETERS) ×4 IMPLANT
CAUTERY SURG HI TEMP FINE TIP (MISCELLANEOUS) ×4 IMPLANT
CNTNR URN SCR LID CUP LEK RST (MISCELLANEOUS) ×6 IMPLANT
CONN 3/8X1/2 ST GISH (MISCELLANEOUS) ×4 IMPLANT
CONN ST 1/4X3/8  BEN (MISCELLANEOUS) ×3
CONN ST 1/4X3/8 BEN (MISCELLANEOUS) ×9 IMPLANT
CONN Y 3/8X3/8X3/8  BEN (MISCELLANEOUS) ×2
CONN Y 3/8X3/8X3/8 BEN (MISCELLANEOUS) ×6 IMPLANT
CONT SPEC 4OZ STRL OR WHT (MISCELLANEOUS) ×2
CONTAINER PROTECT SURGISLUSH (MISCELLANEOUS) ×12 IMPLANT
COVER SURGICAL LIGHT HANDLE (MISCELLANEOUS) ×4 IMPLANT
DEVICE SUT CK QUICK LOAD MINI (Prosthesis & Implant Heart) ×4 IMPLANT
DRAPE CARDIOVASCULAR INCISE (DRAPES) ×1
DRAPE SPACE STATION 30X60X34 (DRAPES) ×4 IMPLANT
DRAPE SRG 135X102X78XABS (DRAPES) ×3 IMPLANT
DRAPE WARM FLUID 44X44 (DRAPES) ×4 IMPLANT
DRSG COVADERM 4X14 (GAUZE/BANDAGES/DRESSINGS) ×4 IMPLANT
DRSG COVADERM 4X6 (GAUZE/BANDAGES/DRESSINGS) ×4 IMPLANT
ELECT BLADE 4.0 EZ CLEAN MEGAD (MISCELLANEOUS) ×4
ELECT CAUTERY BLADE 6.4 (BLADE) ×4 IMPLANT
ELECT REM PT RETURN 9FT ADLT (ELECTROSURGICAL) ×8
ELECTRODE BLDE 4.0 EZ CLN MEGD (MISCELLANEOUS) ×3 IMPLANT
ELECTRODE REM PT RTRN 9FT ADLT (ELECTROSURGICAL) ×6 IMPLANT
FELT TEFLON 1X6 (MISCELLANEOUS) ×12 IMPLANT
GAUZE SPONGE 4X4 12PLY STRL (GAUZE/BANDAGES/DRESSINGS) ×4 IMPLANT
GAUZE SPONGE 4X4 12PLY STRL LF (GAUZE/BANDAGES/DRESSINGS) ×8 IMPLANT
GLOVE BIO SURGEON STRL SZ 6 (GLOVE) IMPLANT
GLOVE BIO SURGEON STRL SZ 6.5 (GLOVE) IMPLANT
GLOVE BIO SURGEON STRL SZ7 (GLOVE) IMPLANT
GLOVE BIO SURGEON STRL SZ7.5 (GLOVE) IMPLANT
GLOVE SURG MICRO LTX SZ7 (GLOVE) ×8 IMPLANT
GLOVE SURG MICRO LTX SZ7.5 (GLOVE) ×4 IMPLANT
GLOVE SURG MICRO LTX SZ8 (GLOVE) ×4 IMPLANT
GOWN STRL REUS W/ TWL LRG LVL3 (GOWN DISPOSABLE) ×12 IMPLANT
GOWN STRL REUS W/ TWL XL LVL3 (GOWN DISPOSABLE) ×6 IMPLANT
GOWN STRL REUS W/TWL LRG LVL3 (GOWN DISPOSABLE) ×4
GOWN STRL REUS W/TWL XL LVL3 (GOWN DISPOSABLE) ×2
GRAFT 4 BRANCH 28X50 (Prosthesis & Implant Heart) ×4 IMPLANT
GRAFT CV 30X8WVN NDL (Graft) ×3 IMPLANT
GRAFT HEMASHIELD 8MM (Graft) ×1 IMPLANT
HEMOSTAT POWDER SURGIFOAM 1G (HEMOSTASIS) ×12 IMPLANT
HEMOSTAT SURGICEL 2X14 (HEMOSTASIS) ×4 IMPLANT
INSERT FOGARTY SM (MISCELLANEOUS) ×12 IMPLANT
INSERT FOGARTY XLG (MISCELLANEOUS) ×8 IMPLANT
IV CATH 22GX1 FEP (IV SOLUTION) ×4 IMPLANT
KIT BASIN OR (CUSTOM PROCEDURE TRAY) ×4 IMPLANT
KIT CATH CPB BARTLE (MISCELLANEOUS) ×4 IMPLANT
KIT SUCTION CATH 14FR (SUCTIONS) ×4 IMPLANT
KIT SUT CK MINI COMBO 4X17 (Prosthesis & Implant Heart) ×4 IMPLANT
KIT TURNOVER KIT B (KITS) ×4 IMPLANT
LINE VENT (MISCELLANEOUS) ×4 IMPLANT
LOOP VESSEL MAXI BLUE (MISCELLANEOUS) ×8 IMPLANT
NS IRRIG 1000ML POUR BTL (IV SOLUTION) ×24 IMPLANT
PACK E OPEN HEART (SUTURE) ×4 IMPLANT
PACK OPEN HEART (CUSTOM PROCEDURE TRAY) ×4 IMPLANT
PAD ARMBOARD 7.5X6 YLW CONV (MISCELLANEOUS) ×8 IMPLANT
POSITIONER HEAD DONUT 9IN (MISCELLANEOUS) ×4 IMPLANT
SET CARDIOPLEGIA MPS 5001102 (MISCELLANEOUS) ×4 IMPLANT
SPONGE LAP 18X18 RF (DISPOSABLE) ×8 IMPLANT
SPONGE LAP 4X18 RFD (DISPOSABLE) ×8 IMPLANT
SUT BONE WAX W31G (SUTURE) ×4 IMPLANT
SUT EB EXC GRN/WHT 2-0 V-5 (SUTURE) ×12 IMPLANT
SUT ETHIBON EXCEL 2-0 V-5 (SUTURE) ×8 IMPLANT
SUT ETHIBOND 2 0 SH (SUTURE) ×1
SUT ETHIBOND 2 0 SH 36X2 (SUTURE) ×3 IMPLANT
SUT ETHIBOND V-5 VALVE (SUTURE) ×8 IMPLANT
SUT PROLENE 3 0 SH 48 (SUTURE) ×8 IMPLANT
SUT PROLENE 3 0 SH DA (SUTURE) ×8 IMPLANT
SUT PROLENE 3 0 SH1 36 (SUTURE) ×8 IMPLANT
SUT PROLENE 4 0 RB 1 (SUTURE) ×8
SUT PROLENE 4-0 RB1 .5 CRCL 36 (SUTURE) ×24 IMPLANT
SUT PROLENE 5 0 C 1 36 (SUTURE) ×20 IMPLANT
SUT SILK 2 0 SH CR/8 (SUTURE) ×4 IMPLANT
SUT STEEL 6MS V (SUTURE) ×8 IMPLANT
SUT VIC AB 1 CTX 36 (SUTURE) ×2
SUT VIC AB 1 CTX36XBRD ANBCTR (SUTURE) ×6 IMPLANT
SUT VIC AB 2-0 CT1 27 (SUTURE) ×1
SUT VIC AB 2-0 CT1 TAPERPNT 27 (SUTURE) ×3 IMPLANT
SUT VIC AB 3-0 SH 27 (SUTURE) ×1
SUT VIC AB 3-0 SH 27X BRD (SUTURE) ×3 IMPLANT
SYSTEM SAHARA CHEST DRAIN ATS (WOUND CARE) ×4 IMPLANT
TAPE CLOTH SURG 4X10 WHT LF (GAUZE/BANDAGES/DRESSINGS) ×4 IMPLANT
TAPE PAPER 2X10 WHT MICROPORE (GAUZE/BANDAGES/DRESSINGS) ×4 IMPLANT
TOWEL GREEN STERILE (TOWEL DISPOSABLE) ×4 IMPLANT
TOWEL GREEN STERILE FF (TOWEL DISPOSABLE) ×4 IMPLANT
TRAY FOLEY SLVR 16FR TEMP STAT (SET/KITS/TRAYS/PACK) ×4 IMPLANT
TUBE CONNECTING 12X1/4 (SUCTIONS) ×4 IMPLANT
UNDERPAD 30X36 HEAVY ABSORB (UNDERPADS AND DIAPERS) ×4 IMPLANT
VALVE AORTIC SZ23 INSP/RESIL (Prosthesis & Implant Heart) ×4 IMPLANT
VENT LEFT HEART 12002 (CATHETERS) ×8
WATER STERILE IRR 1000ML POUR (IV SOLUTION) ×8 IMPLANT
YANKAUER SUCT BULB TIP NO VENT (SUCTIONS) ×4 IMPLANT

## 2021-04-21 NOTE — Transfer of Care (Signed)
Immediate Anesthesia Transfer of Care Note  Patient: Deborah Koch  Procedure(s) Performed: AORTIC VALVE REPLACEMENT (AVR) ON PUMP WITH 23MM INSPIRIS AORTIC VALVE (N/A Chest) REPLACEMENT ASCENDING & ARCH AORTA WITH HEMASHIELD PLATINUM GRAFT 28 [id number] (N/A ) TRANSESOPHAGEAL ECHOCARDIOGRAM (TEE) (N/A ) AORTA -INNOMIATE BYPASS (N/A ) AORTA LEFT COMMON CAROTID BYPASS (Left )  Patient Location: ICU  Anesthesia Type:General  Level of Consciousness: sedated and Patient remains intubated per anesthesia plan  Airway & Oxygen Therapy: Patient remains intubated per anesthesia plan and Patient placed on Ventilator (see vital sign flow sheet for setting)  Post-op Assessment: Report given to RN and Post -op Vital signs reviewed and stable  Post vital signs: Reviewed and stable  Last Vitals:  Vitals Value Taken Time  BP    Temp 34.3 C 04/21/21 1530  Pulse    Resp 12 04/21/21 1530  SpO2 92 % 04/21/21 1530  Vitals shown include unvalidated device data.  Last Pain:  Vitals:   04/21/21 0556  TempSrc: Oral  PainSc: 0-No pain         Complications: No complications documented.   Patient transported to ICU with standard monitors (HR, BP, SPO2, RR) and emergency drugs/equipment. Controlled ventilation maintained via ambu bag. Report given to bedside RN and respiratory therapist. Pt connected to ICU monitor and ventilator. All questions answered and vital signs stable before leaving

## 2021-04-21 NOTE — Anesthesia Procedure Notes (Signed)
Arterial Line Insertion Performed by: Audry Pili, MD, Vana Arif, Ivar Drape, CRNA, anesthesiologist  Preanesthetic checklist: patient identified, IV checked and site marked Patient sedated Right, radial was placed Catheter size: 20 G  Attempts: 1 Procedure performed without using ultrasound guided technique. Following insertion, dressing applied and Biopatch. Post procedure assessment: normal  Patient tolerated the procedure well with no immediate complications. Additional procedure comments: Performed by srna Carleene Cooper.

## 2021-04-21 NOTE — Anesthesia Postprocedure Evaluation (Signed)
Anesthesia Post Note  Patient: Deborah Koch  Procedure(s) Performed: AORTIC VALVE REPLACEMENT (AVR) ON PUMP WITH 23MM INSPIRIS AORTIC VALVE (N/A Chest) REPLACEMENT ASCENDING & ARCH AORTA WITH HEMASHIELD PLATINUM GRAFT 28 [id number] (N/A ) TRANSESOPHAGEAL ECHOCARDIOGRAM (TEE) (N/A ) AORTA -INNOMIATE BYPASS (N/A ) AORTA LEFT COMMON CAROTID BYPASS (Left )     Patient location during evaluation: ICU Anesthesia Type: General Level of consciousness: sedated and patient remains intubated per anesthesia plan Pain management: pain level controlled Vital Signs Assessment: post-procedure vital signs reviewed and stable Respiratory status: patient remains intubated per anesthesia plan Cardiovascular status: stable Postop Assessment: no apparent nausea or vomiting Anesthetic complications: no   No complications documented.  Last Vitals:  Vitals:   04/21/21 0725 04/21/21 1520  BP:    Pulse: 66 90  Resp:  12  Temp:    SpO2: 100% 96%    Last Pain:  Vitals:   04/21/21 0556  TempSrc: Oral  PainSc: 0-No pain                 Audry Pili

## 2021-04-21 NOTE — Anesthesia Procedure Notes (Signed)
Central Venous Catheter Insertion Performed by: Audry Pili, MD, anesthesiologist Start/End5/04/2021 7:17 AM, 04/21/2021 7:19 AM Patient location: Pre-op. Preanesthetic checklist: patient identified, IV checked, risks and benefits discussed, surgical consent, monitors and equipment checked, pre-op evaluation, timeout performed and anesthesia consent Position: Trendelenburg Hand hygiene performed  and maximum sterile barriers used  Total catheter length 10. PA cath was placed.Swan type:thermodilution PA Cath depth:50 Procedure performed without using ultrasound guided technique. Attempts: 1 Patient tolerated the procedure well with no immediate complications.

## 2021-04-21 NOTE — Anesthesia Procedure Notes (Signed)
Central Venous Catheter Insertion Performed by: Audry Pili, MD, anesthesiologist Start/End5/04/2021 7:09 AM, 04/21/2021 7:19 AM Patient location: Pre-op. Preanesthetic checklist: patient identified, IV checked, risks and benefits discussed, surgical consent, monitors and equipment checked, pre-op evaluation, timeout performed and anesthesia consent Position: Trendelenburg Lidocaine 1% used for infiltration and patient sedated Hand hygiene performed , maximum sterile barriers used  and Seldinger technique used Catheter size: 8.5 Fr Central line was placed.MAC introducer Procedure performed using ultrasound guided technique. Ultrasound Notes:anatomy identified, needle tip was noted to be adjacent to the nerve/plexus identified, no ultrasound evidence of intravascular and/or intraneural injection and image(s) printed for medical record Attempts: 1 Following insertion, line sutured, dressing applied and Biopatch. Post procedure assessment: blood return through all ports, no air and free fluid flow  Patient tolerated the procedure well with no immediate complications.

## 2021-04-21 NOTE — Op Note (Signed)
CARDIOVASCULAR SURGERY OPERATIVE NOTE  10/01/2014  Surgeon:  Gaye Pollack, MD  First Assistant: Jadene Pierini,  PA-C   Preoperative Diagnosis:  Severe bicuspid aortic valve stenosis with ascending aorta and arch aneurysm.  Postoperative Diagnosis:  Same   Procedure:  1. Median Sternotomy 2. Right axillary artery cannulation using an 8 mm Hemashield graft. 3.   Extracorporeal circulation via the right axillary artery and right atrium 4.   Replacement of ascending aorta and aortic arch with aorto-innominate and aorto-left carotid bypass using a 28 x 10 x 8 x 8 x 10 mm Hemashield Platinum graft.  Supra-coronary proximal anastomosis. 5.   Aortic valve replacement using a 23 mm Edwards INSPIRIS RESILIA pericardial valve.    Anesthesia:  General Endotracheal  Anesthesiologist: Dr. Renold Don    Clinical History/Surgical Indication:  This 65 year old woman has severe bicuspid aortic valve stenosis with a 4.7 cm fusiform ascending aortic aneurysm. She has New York Heart Association class II symptoms of exertional fatigue consistent with chronic diastolic congestive heart failure as well as some positional dizziness and intermittent chest discomfort at rest. I have personally reviewed her 2D echocardiogram, cardiac catheterization, and CTA studies. Her echocardiogram shows a bicuspid aortic valve with thickening and restricted leaflet motion. Her mean gradient is increased from 30 to 45 mmHg over the past year. Left ventricular systolic function is normal. CTA of the chest shows a 4.7 cm fusiform ascending aortic aneurysm that extends from the level of the sinotubular junction up to the proximal aortic arch. The aorta adjacent to the innominate artery is still 4.1 cm and does not return to normal until just beyondthe left common carotid artery. Cardiac catheterization shows nonobstructive coronary disease. I think the best treatment for her is aortic valve replacement using a  bioprosthetic valve and replacement of the ascending aorta and proximal aortic arch from the level of the sinotubular junction to the mid aortic arch between the left common carotid and left subclavian arteries. This will require right axillary artery cannulation and circulatory arrest. She will require bilateral radial arterial lines for blood pressure monitoring. The sinuses of Valsalva are fairly normal and I do not think she requires aortic root replacement. I reviewed all the studies with her and her sister and answered all their questions. I discussed the operative procedure with the patient andsisterincluding alternatives, benefits and risks; including but not limited to bleeding, blood transfusion, infection, stroke, myocardial infarction, graft failure, heart block requiring a permanent pacemaker, organ dysfunction, and death. Vedia Coffer Lattaunderstands and agrees to proceed.     Preparation:  The patient was seen in the preoperative holding area and the correct patient, correct operation were confirmed with the patient after reviewing the medical record and catheterization. The consent was signed by me. Preoperative antibiotics were given. A pulmonary arterial line and radial arterial line were placed by the anesthesia team. The patient was taken back to the operating room and positioned supine on the operating room table. After being placed under general endotracheal anesthesia by the anesthesia team a foley catheter was placed. The neck, chest, abdomen, and both legs were prepped with betadine soap and solution and draped in the usual sterile manner. A surgical time-out was taken and the correct patient and operative procedure were confirmed with the nursing and anesthesia staff.  *INTRAOPERATIVE TRANSESOPHAGEAL REPORT *      Patient Name:  TEA COLLUMS Date of Exam: 04/21/2021  Medical Rec #: [id number]      Height:  62.0 in  Accession #:  [id number]       Weight:    134.1 lb  Date of Birth: 18-Feb-1956      BSA:     1.61 m  Patient Age:  65 years       BP:      135/75 mmHg  Patient Gender: F          HR:      70 bpm.  Exam Location: Anesthesiology   Transesophogeal exam was perform intraoperatively during surgical  procedure.  Patient was closely monitored under general anesthesia during the entirety  of  examination.   Indications:   I71.2 Ascending aortic aneurysm; Q23.1 Bicuspid aortic  valve  Performing Phys: Renold Don MD  Diagnosing Phys: Renold Don MD   Complications: No known complications during this procedure.  POST-OP IMPRESSIONS  - Left Ventricle: The left ventricle is unchanged from pre-bypass.  - Right Ventricle: The right ventricle appears unchanged from pre-bypass.  - Aorta: A graft was placed in the ascending aorta for repair.  - Left Atrium: The left atrium appears unchanged from pre-bypass.  - Left Atrial Appendage: The left atrial appendage appears unchanged from  pre-bypass.  - Aortic Valve: A bovine bioprosthetic valve was placed, leaflets are  freely  mobile and leaflets thin Size; 27mm.  - Mitral Valve: The mitral valve appears unchanged from pre-bypass.  - Tricuspid Valve: The tricuspid valve appears unchanged from pre-bypass.  - Pulmonic Valve: The pulmonic valve appears unchanged from pre-bypass.  - Interatrial Septum: The interatrial septum appears unchanged from  pre-bypass.  - Interventricular Septum: The interventricular septum appears unchanged  from  pre-bypass.  - Pericardium: The pericardium appears unchanged from pre-bypass.   PRE-OP FINDINGS  Left Ventricle: The left ventricle has normal systolic function, with an  ejection fraction of 60-65%. The cavity size was normal. There is  moderately increased left ventricular wall thickness. There is moderate  concentric left ventricular hypertrophy.    Right Ventricle: The right ventricle  has normal systolic function. The  cavity was normal. There is no increase in right ventricular wall  thickness.   Left Atrium: Left atrial size was normal in size. No left atrial/left  atrial appendage thrombus was detected.   Right Atrium: Right atrial size was normal in size.   Interatrial Septum: No atrial level shunt detected by color flow Doppler.   Pericardium: There is no evidence of pericardial effusion.   Mitral Valve: The mitral valve is normal in structure. Mitral valve  regurgitation is trivial by color flow Doppler. There is No evidence of  mitral stenosis.   Tricuspid Valve: The tricuspid valve was normal in structure. Tricuspid  valve regurgitation is mild by color flow Doppler.   Aortic Valve: The aortic valve is bicuspid Aortic valve regurgitation is  trivial by color flow Doppler. There is severe stenosis of the aortic  valve.  AV peak gradient 60 mmHg.   Pulmonic Valve: The pulmonic valve was normal in structure.  Pulmonic valve regurgitation is not visualized by color flow Doppler.    Aorta: The is normal in size and structure. The aortic arch was not well  visualized. There is dilatation of the ascending aorta, measuring 40 mm.   +-------------+---------++  AORTIC VALVE        +-------------+---------++  AV Mean Grad:35.0 mmHg  +-------------+---------++     Renold Don MD  Electronically signed by Renold Don MD  Signature Date/Time: 04/21/2021/2:46:13 PM       Right  axillary artery exposure and cannulation:   A median sternotomy was performed. The pericardium was opened in the midline. Right ventricular function appeared normal. The ascending aorta was markedly aneurysmal and had no palpable plaque. A transverse incision was made below the right clavicle. The pectoralis major muscle was split along its fibers and the pectoralis minor muscle was retracted laterally. The brachial plexus was identified and gently retracted laterally  to expose the axillary artery.  The artery was controlled proximally and distally with vessel loops. The patient was fully heparinized and ACT maintained greater than 400. The axillary artery was clamped proximally and distally with peripheral Debakey clamps. It was opened longitudinally. The wall was normal.  An 8 mm Hemashield Platinum graft was anastomosed in an end to side manner using continuous 5-0 prolene suture. CoSeal was applied for hemostasis and the clamp removed. The graft was connected to the arterial end of the bypass circuit which was divided into a Y-configuration to perfuse the axillary artery and the arch graft.   Cardiopulmonary Bypass:  Venous cannulation was performed via the right atrial appendage using a 52F single-staged venous cannula. I could not get a two stage cannula to advance into the IVC.  Systemic cooling to 20 degrees Centigrade and topical cooling of the heart with iced saline were used. A temperature probe was inserted into the interventricular septum and an insulating pad was placed in the pericardium. CO2 was insufflated into the pericardium throughout the case to minimize intracardiac air.   Resection and grafting of aortic arch and ascending aorta:  The patient was placed on cardiopulmonary bypass and a left ventricular vent was placed via the right superior pulmonary vein. Systemic cooling was begun with a goal temperature of 18 degrees centigrade by bladder and rectal temperature probes. A retrograde cardioplegia cannula was placed through the right atrium into the coronary sinus without difficulty. While cooling, the arch was dissected free. The left common carotid and innominate arteries were also dissected free and encircled. The aortic arch was aneurysmal at 4.1 cm up to the origin of the left common carotid, then decreased to normal size.  After 30 minutes of cooling the target temperature of 20 degrees centigrade was reached. Cerebral oximetry was 70%  bilaterally. BIS was zero. The patient was given Propofol and 125 mg of Solumedrol by anesthesia. The head was packed in ice. The bed was placed in steep trendelenburg. Circulatory arrest was begun and the blood volume emptied into the venous reservoir. The innominate and left carotid arteries were occluded with peripheral Debakey clamps. Continuous antegrade cerebral perfusion was begun via the right axillary artery. Cold blood retrograde cardioplegia was given and myocardial temperature dropped to 10 degrees centigrade. Additional doses were given at approximately 20 minute intervals throughout the period of circulatory arrest and cross-clamping. Complete diastolic arrest was maintained.The aorta was transected just proximal to the left subclavian artery. The aortic diameter was measured at 28 mm here. A 28 x 10 x 8 x 8 x 10 mm Hemashield Platinum vascular graft was prepared (REF, [id number] P0, Lot [id number], SN [id number]) . One of the 8 mm side arm grafts was ligated with a heavy silk tie and suture ligated with two layers of 4-0 prolene.  The end of the 28 mm graft was anastomosed to the distal aortic arch in an end to end manner using 3-0 prolene continuous suture with a felt strip to reinforce the anastomisis. A light coating of CoSeal was applied to seal needle holes. The other end  of the Y in the arterial end of the bypass circuit was then connected to the 47mm side arm graft. The two other sidearm grafts were controlled with vascular clamps. The aortic graft was cross-clamped proximal to the side arm grafts and full CPB support was resumed. Circulatory arrest time with antegrade cerebral perfusion was 34 minutes.  The cerebral oximetry showed normal oximetry bilaterally throughout the case including during the circulatory arrest  The patient was rewarmed to 28 degrees C.   Aorto-left carotid and aorto-innominate bypass:  The 8 mm sidearm graft was brought under the bracheocephalic vein and anastomosed  to the  left common carotid artery in an end to end manner using continuous 5-0 prolene suture. The clamps were removed from the left carotid and the graft. Then the 10 mm sidearm graft was brought under the bracheocephalic vein and cut to the appropriate length. It was anastomosed to the innominate artery in an end to end manner using continuous 5-0 prolene suture. The clamps were removed.   Aortic valve replacment:  The ascending aorta was opened longitudinally down to the sinotubular junction. The aortic root appeared to be normal size with no sinus dilatation. The valve was a Sievers type 0 bicuspid with two commissures and no raphe.  The leaflets were very thick and fused at the commissures. There was minimal annular calcium. Care was taken to remove all particulate debris. The left ventricle was directly inspected for debris and then irrigated with ice saline solution. The annulus was sized and a size 23 mm Edwards INSPIRIS RESILIA pericardial valve was chosen. The model number was [id number] and the serial number was [id number].  While the valve was being prepared 2-0 Ethibond pledgeted horizontal mattress sutures were placed around the annulus with the pledgets in a sub-annular position. The sutures were placed through the sewing ring and the valve lowered into place. The sutures were tied using the COR-KNOT device.  The valve seated nicely and the coronary ostia were not obstructed. The prosthetic valve leaflets moved normally and there was no sub-valvular obstruction. The aorta was transected at the STJ and the main graft cut to the appropriate length. It was anastomosed to the aorta in an end to end manner using continuous 3-0 prolene suture with a felt strip to reinforce the anastomosis.       Completion:  The crossclamp was removed with a time of 171 minutes. There was spontaneous return of sinus rhythm. The distal anastomosis were checked for hemostasis. The position of the grafts was  satisfactory. The vascular anastomoses all appeared hemostatic. Two temporary epicardial pacing wires were placed on the right atrium and two on the right ventricle. The patient was weaned from CPB without difficulty on no inotropes. CPB time was 213 minutes. Cardiac output was 5 LPM. TEE showed normal LV and RV function with no AI or MR. Heparin was fully reversed with protamine and the venous cannula removed. The arterial sidearm graft and the axillary artery graft were ligated with a heavy silk tie and suture ligated with a pledgetted 3-0 prolene suture. Hemostasis was achieved. Two mediastinal  drainage tubes were placed. The sternum was closed with  #6 stainless steel wires. The fascia was closed with continuous # 1 vicryl suture. The subcutaneous tissue was closed with 2-0 vicryl continuous suture. The skin was closed with 3-0 vicryl subcuticular suture. The right subclavicular incision was closed in layers in a similar manner. All sponge, needle, and instrument counts were reported correct at the end of  the case. Dry sterile dressings were placed over the incisions and around the chest tubes which were connected to pleurevac suction. The patient was then transported to the cardiac surgery ICU in stable condition.

## 2021-04-21 NOTE — Anesthesia Procedure Notes (Signed)
Procedure Name: Intubation Date/Time: 04/21/2021 7:52 AM Performed by: Darletta Moll, CRNA Pre-anesthesia Checklist: Patient identified, Emergency Drugs available, Suction available and Patient being monitored Patient Re-evaluated:Patient Re-evaluated prior to induction Oxygen Delivery Method: Circle system utilized Preoxygenation: Pre-oxygenation with 100% oxygen Induction Type: IV induction Ventilation: Mask ventilation without difficulty Laryngoscope Size: Miller and 2 Tube type: Oral Tube size: 7.5 mm Number of attempts: 1 Airway Equipment and Method: Stylet Placement Confirmation: ETT inserted through vocal cords under direct vision,  positive ETCO2 and breath sounds checked- equal and bilateral Secured at: 23 cm Tube secured with: Tape Dental Injury: Teeth and Oropharynx as per pre-operative assessment  Comments: Performed by Carleene Cooper SRNA

## 2021-04-21 NOTE — Anesthesia Procedure Notes (Signed)
Arterial Line Insertion Start/End5/04/2021 7:07 AM Performed by: Audry Pili, MD, Ferrel Simington, Ivar Drape, CRNA  Patient location: Pre-op. Patient sedated Left, radial was placed Catheter size: 20 G Hand hygiene performed  and Seldinger technique used Allen's test indicative of satisfactory collateral circulation Attempts: 1 Procedure performed without using ultrasound guided technique. Ultrasound Notes:anatomy identified and needle tip was noted to be adjacent to the nerve/plexus identified Following insertion, dressing applied and Biopatch. Post procedure assessment: normal  Patient tolerated the procedure well with no immediate complications.

## 2021-04-21 NOTE — Progress Notes (Signed)
EVENING ROUNDS NOTE :     [facility]       [place] [address]             [phone]                 Day of Surgery Procedure(s) (LRB): AORTIC VALVE REPLACEMENT (AVR) ON PUMP WITH 23MM INSPIRIS AORTIC VALVE (N/A) REPLACEMENT ASCENDING & ARCH AORTA WITH HEMASHIELD PLATINUM GRAFT 28 [id number] (N/A) TRANSESOPHAGEAL ECHOCARDIOGRAM (TEE) (N/A) AORTA -INNOMIATE BYPASS (N/A) AORTA LEFT COMMON CAROTID BYPASS (Left)   Total Length of Stay:  LOS: 0 days  Events:   Minimal CT output HD stable    BP 96/72   Pulse 90   Temp (!) 95.36 F (35.2 C)   Resp 12   Ht 5\' 2"  (1.575 m)   Wt 60.8 kg   SpO2 96%   BMI 24.52 kg/m   PAP: (17-295)/(9-218) 24/15 CO:  [2.9 L/min] 2.9 L/min CI:  [1.8 L/min/m2] 1.8 L/min/m2  Vent Mode: SIMV;PRVC;PSV FiO2 (%):  [50 %] 50 % Set Rate:  [12 bmp] 12 bmp Vt Set:  [400 mL] 400 mL PEEP:  [5 cmH20] 5 cmH20 Pressure Support:  [10 cmH20] 10 cmH20 Plateau Pressure:  [17 cmH20] 17 cmH20  . sodium chloride    . [START ON 04/22/2021] sodium chloride    . sodium chloride 20 mL/hr at 04/21/21 1607  . albumin human 12.5 g (04/21/21 1606)  .  ceFAZolin (ANCEF) IV    . dexmedetomidine (PRECEDEX) IV infusion 0.7 mcg/kg/hr (04/21/21 1600)  . famotidine (PEPCID) IV 20 mg (04/21/21 1629)  . insulin 0.8 mL/hr at 04/21/21 1600  . lactated ringers    . lactated ringers Stopped (04/21/21 1610)  . lactated ringers 20 mL/hr at 04/21/21 1600  . magnesium sulfate 4 g (04/21/21 1613)  . nitroGLYCERIN 75 mcg/min (04/21/21 1600)  . phenylephrine (NEO-SYNEPHRINE) Adult infusion Stopped (04/21/21 1537)  . potassium chloride 10 mEq (04/21/21 1719)  . vancomycin      No intake/output data recorded.   CBC Latest Ref Rng & Units 04/21/2021 04/21/2021 04/21/2021  WBC 4.0 - 10.5 K/uL 6.9 - -  Hemoglobin 12.0 - 15.0 g/dL 8.3(L) 9.2(L) 8.8(L)  Hematocrit 36.0 - 46.0 % 24.7(L) 27.0(L) 26.0(L)  Platelets 150 - 400 K/uL 129(L) - -    BMP Latest Ref Rng &  Units 04/21/2021 04/21/2021 04/21/2021  Glucose 70 - 99 mg/dL 130(H) - -  BUN 8 - 23 mg/dL 9 - -  Creatinine 0.44 - 1.00 mg/dL 0.40(L) - -  BUN/Creat Ratio 12 - 28 - - -  Sodium 135 - 145 mmol/L 141 141 139  Potassium 3.5 - 5.1 mmol/L 4.6 4.6 5.6(H)  Chloride 98 - 111 mmol/L 106 - -  CO2 22 - 32 mmol/L - - -  Calcium 8.9 - 10.3 mg/dL - - -    ABG    Component Value Date/Time   PHART 7.482 (H) 04/21/2021 1343   PCO2ART 31.9 (L) 04/21/2021 1343   PO2ART 159 (H) 04/21/2021 1343   HCO3 23.9 04/21/2021 1343   TCO2 24 04/21/2021 1352   ACIDBASEDEF 2.0 04/21/2021 1112   O2SAT 100.0 04/21/2021 1343       Melodie Bouillon, MD 04/21/2021 5:48 PM

## 2021-04-21 NOTE — Brief Op Note (Signed)
04/21/2021  1:28 PM  PATIENT:  [name]  65 y.o. female  PRE-OPERATIVE DIAGNOSIS:  BICUSPID AORTIC STENOSIS TAA  POST-OPERATIVE DIAGNOSIS:  BICUSPID AORTIC STENOSIS TAA  PROCEDURE:  Procedure(s) with comments: AORTIC VALVE REPLACEMENT (AVR) ON PUMP WITH 23MM INSPIRIS AORTIC VALVE (N/A) REPLACEMENT ASCENDING & ARCH AORTA WITH HEMASHIELD PLATINUM GRAFT 28 [id number] (N/A) - CIRC ARREST  RIGHT AXILLARY ARTERY CANNULATION TRANSESOPHAGEAL ECHOCARDIOGRAM (TEE) (N/A) AORTA -INNOMIATE BYPASS (N/A) AORTA LEFT COMMON CAROTID BYPASS (Left)  SURGEON:  Surgeon(s) and Role: Panel 1:    * Bartle, Fernande Boyden, MD - Primary Panel 2:    * Gaye Pollack, MD - Primary  PHYSICIAN ASSISTANT: Oswin Johal PA-C  ASSISTANTS: STAFF   ANESTHESIA:   general  EBL:  325 mL   BLOOD ADMINISTERED:SEE ANESTHESIA AND PERFUSION RECORDS  DRAINS: MEDIASTINAL CHEST TUBES    LOCAL MEDICATIONS USED:  NONE  SPECIMEN:  Source of Specimen:  AORTIC ANEURYSM AND VALVE  DISPOSITION OF SPECIMEN:  PATHOLOGY  COUNTS:  YES  TOURNIQUET:  * No tourniquets in log *  DICTATION: .Dragon Dictation  PLAN OF CARE: Admit to inpatient   PATIENT DISPOSITION:  ICU - intubated and hemodynamically stable.   Delay start of Pharmacological VTE agent (>24hrs) due to surgical blood loss or risk of bleeding: yes  COMPLICATIONS: NO KNOWN

## 2021-04-21 NOTE — Interval H&P Note (Signed)
History and Physical Interval Note:  04/21/2021 7:08 AM  Deborah Koch  has presented today for surgery, with the diagnosis of BICUSPID AORTIC STENOSIS TAA.  The various methods of treatment have been discussed with the patient and family. After consideration of risks, benefits and other options for treatment, the patient has consented to  Procedure(s) with comments: AORTIC VALVE REPLACEMENT (AVR) (N/A) REPLACEMENT ASCENDING & ARCH AORTA (N/A) - CIRC ARREST  RIGHT AXILLARY ARTERY CANNULATION TRANSESOPHAGEAL ECHOCARDIOGRAM (TEE) (N/A) AORTA -INNOMIATE BYPASS (N/A) AORTA LEFT COMMON CAROTID BYPASS (Left) as a surgical intervention.  The patient's history has been reviewed, patient examined, no change in status, stable for surgery.  I have reviewed the patient's chart and labs.  Questions were answered to the patient's satisfaction.     Gaye Pollack

## 2021-04-22 ENCOUNTER — Encounter: Payer: Self-pay | Admitting: Thoracic Surgery (Cardiothoracic Vascular Surgery)

## 2021-04-22 ENCOUNTER — Inpatient Hospital Stay (HOSPITAL_COMMUNITY): Payer: Medicare Other

## 2021-04-22 ENCOUNTER — Encounter (HOSPITAL_COMMUNITY): Payer: Self-pay | Admitting: Surgery

## 2021-04-22 ENCOUNTER — Other Ambulatory Visit: Payer: Self-pay | Admitting: Cardiology

## 2021-04-22 DIAGNOSIS — Z9889 Other specified postprocedural states: Secondary | ICD-10-CM

## 2021-04-22 DIAGNOSIS — Z8679 Personal history of other diseases of the circulatory system: Secondary | ICD-10-CM

## 2021-04-22 LAB — GLUCOSE, CAPILLARY
Glucose-Capillary: 115 mg/dL — ABNORMAL HIGH (ref 70–99)
Glucose-Capillary: 117 mg/dL — ABNORMAL HIGH (ref 70–99)
Glucose-Capillary: 118 mg/dL — ABNORMAL HIGH (ref 70–99)
Glucose-Capillary: 122 mg/dL — ABNORMAL HIGH (ref 70–99)
Glucose-Capillary: 127 mg/dL — ABNORMAL HIGH (ref 70–99)
Glucose-Capillary: 134 mg/dL — ABNORMAL HIGH (ref 70–99)
Glucose-Capillary: 136 mg/dL — ABNORMAL HIGH (ref 70–99)
Glucose-Capillary: 137 mg/dL — ABNORMAL HIGH (ref 70–99)
Glucose-Capillary: 138 mg/dL — ABNORMAL HIGH (ref 70–99)
Glucose-Capillary: 145 mg/dL — ABNORMAL HIGH (ref 70–99)
Glucose-Capillary: 146 mg/dL — ABNORMAL HIGH (ref 70–99)
Glucose-Capillary: 147 mg/dL — ABNORMAL HIGH (ref 70–99)
Glucose-Capillary: 161 mg/dL — ABNORMAL HIGH (ref 70–99)

## 2021-04-22 LAB — PREPARE CRYOPRECIPITATE
Unit division: 0
Unit division: 0

## 2021-04-22 LAB — CBC
HCT: 24.9 % — ABNORMAL LOW (ref 36.0–46.0)
HCT: 27.4 % — ABNORMAL LOW (ref 36.0–46.0)
Hemoglobin: 8.4 g/dL — ABNORMAL LOW (ref 12.0–15.0)
Hemoglobin: 9.2 g/dL — ABNORMAL LOW (ref 12.0–15.0)
MCH: 29.8 pg (ref 26.0–34.0)
MCH: 30.5 pg (ref 26.0–34.0)
MCHC: 33.7 g/dL (ref 30.0–36.0)
MCHC: 33.6 g/dL (ref 30.0–36.0)
MCV: 88.3 fL (ref 80.0–100.0)
MCV: 90.7 fL (ref 80.0–100.0)
Platelets: 141 10*3/uL — ABNORMAL LOW (ref 150–400)
Platelets: 105 10*3/uL — ABNORMAL LOW (ref 150–400)
RBC: 2.82 MIL/uL — ABNORMAL LOW (ref 3.87–5.11)
RBC: 3.02 MIL/uL — ABNORMAL LOW (ref 3.87–5.11)
RDW: 18.4 % — ABNORMAL HIGH (ref 11.5–15.5)
RDW: 19 % — ABNORMAL HIGH (ref 11.5–15.5)
WBC: 9.6 10*3/uL (ref 4.0–10.5)
WBC: 14.3 10*3/uL — ABNORMAL HIGH (ref 4.0–10.5)
nRBC: 0 % (ref 0.0–0.2)
nRBC: 0 % (ref 0.0–0.2)

## 2021-04-22 LAB — POCT I-STAT 7, (LYTES, BLD GAS, ICA,H+H)
Acid-Base Excess: 0 mmol/L (ref 0.0–2.0)
Acid-base deficit: 2 mmol/L (ref 0.0–2.0)
Acid-base deficit: 3 mmol/L — ABNORMAL HIGH (ref 0.0–2.0)
Acid-base deficit: 3 mmol/L — ABNORMAL HIGH (ref 0.0–2.0)
Acid-base deficit: 3 mmol/L — ABNORMAL HIGH (ref 0.0–2.0)
Bicarbonate: 22.7 mmol/L (ref 20.0–28.0)
Bicarbonate: 24.4 mmol/L (ref 20.0–28.0)
Bicarbonate: 24.9 mmol/L (ref 20.0–28.0)
Bicarbonate: 23 mmol/L (ref 20.0–28.0)
Bicarbonate: 22.2 mmol/L (ref 20.0–28.0)
Calcium, Ion: 1.08 mmol/L — ABNORMAL LOW (ref 1.15–1.40)
Calcium, Ion: 1.17 mmol/L (ref 1.15–1.40)
Calcium, Ion: 1.19 mmol/L (ref 1.15–1.40)
Calcium, Ion: 1.18 mmol/L (ref 1.15–1.40)
Calcium, Ion: 1.18 mmol/L (ref 1.15–1.40)
HCT: 25 % — ABNORMAL LOW (ref 36.0–46.0)
HCT: 26 % — ABNORMAL LOW (ref 36.0–46.0)
HCT: 26 % — ABNORMAL LOW (ref 36.0–46.0)
HCT: 24 % — ABNORMAL LOW (ref 36.0–46.0)
HCT: 24 % — ABNORMAL LOW (ref 36.0–46.0)
Hemoglobin: 8.5 g/dL — ABNORMAL LOW (ref 12.0–15.0)
Hemoglobin: 8.8 g/dL — ABNORMAL LOW (ref 12.0–15.0)
Hemoglobin: 8.8 g/dL — ABNORMAL LOW (ref 12.0–15.0)
Hemoglobin: 8.2 g/dL — ABNORMAL LOW (ref 12.0–15.0)
Hemoglobin: 8.2 g/dL — ABNORMAL LOW (ref 12.0–15.0)
O2 Saturation: 91 %
O2 Saturation: 93 %
O2 Saturation: 95 %
O2 Saturation: 92 %
O2 Saturation: 89 %
Patient temperature: 34.2
Patient temperature: 36
Patient temperature: 36.2
Patient temperature: 36.3
Patient temperature: 36.2
Potassium: 3.5 mmol/L (ref 3.5–5.1)
Potassium: 4.2 mmol/L (ref 3.5–5.1)
Potassium: 4.4 mmol/L (ref 3.5–5.1)
Potassium: 4.1 mmol/L (ref 3.5–5.1)
Potassium: 3.9 mmol/L (ref 3.5–5.1)
Sodium: 142 mmol/L (ref 135–145)
Sodium: 143 mmol/L (ref 135–145)
Sodium: 144 mmol/L (ref 135–145)
Sodium: 143 mmol/L (ref 135–145)
Sodium: 143 mmol/L (ref 135–145)
TCO2: 24 mmol/L (ref 22–32)
TCO2: 26 mmol/L (ref 22–32)
TCO2: 26 mmol/L (ref 22–32)
TCO2: 24 mmol/L (ref 22–32)
TCO2: 23 mmol/L (ref 22–32)
pCO2 arterial: 38.6 mmHg (ref 32.0–48.0)
pCO2 arterial: 43.1 mmHg (ref 32.0–48.0)
pCO2 arterial: 49.1 mmHg — ABNORMAL HIGH (ref 32.0–48.0)
pCO2 arterial: 41.4 mmHg (ref 32.0–48.0)
pCO2 arterial: 39.1 mmHg (ref 32.0–48.0)
pH, Arterial: 7.3 — ABNORMAL LOW (ref 7.350–7.450)
pH, Arterial: 7.325 — ABNORMAL LOW (ref 7.350–7.450)
pH, Arterial: 7.405 (ref 7.350–7.450)
pH, Arterial: 7.349 — ABNORMAL LOW (ref 7.350–7.450)
pH, Arterial: 7.36 (ref 7.350–7.450)
pO2, Arterial: 53 mmHg — ABNORMAL LOW (ref 83.0–108.0)
pO2, Arterial: 71 mmHg — ABNORMAL LOW (ref 83.0–108.0)
pO2, Arterial: 79 mmHg — ABNORMAL LOW (ref 83.0–108.0)
pO2, Arterial: 66 mmHg — ABNORMAL LOW (ref 83.0–108.0)
pO2, Arterial: 56 mmHg — ABNORMAL LOW (ref 83.0–108.0)

## 2021-04-22 LAB — PREPARE PLATELET PHERESIS
Unit division: 0
Unit division: 0

## 2021-04-22 LAB — BPAM PLATELET PHERESIS
Blood Product Expiration Date: 202205052359
Blood Product Expiration Date: 202205062359
ISSUE DATE / TIME: 202205051346
ISSUE DATE / TIME: 202205051414
Unit Type and Rh: 600
Unit Type and Rh: 6200

## 2021-04-22 LAB — BPAM CRYOPRECIPITATE
Blood Product Expiration Date: 202205052014
Blood Product Expiration Date: 202205052014
ISSUE DATE / TIME: 202205051432
ISSUE DATE / TIME: 202205051432
Unit Type and Rh: 5100
Unit Type and Rh: 5100

## 2021-04-22 LAB — PREPARE FRESH FROZEN PLASMA: Unit division: 0

## 2021-04-22 LAB — BASIC METABOLIC PANEL
Anion gap: 6 (ref 5–15)
Anion gap: 5 (ref 5–15)
BUN: 14 mg/dL (ref 8–23)
BUN: 17 mg/dL (ref 8–23)
CO2: 24 mmol/L (ref 22–32)
CO2: 25 mmol/L (ref 22–32)
Calcium: 8.1 mg/dL — ABNORMAL LOW (ref 8.9–10.3)
Calcium: 8.2 mg/dL — ABNORMAL LOW (ref 8.9–10.3)
Chloride: 110 mmol/L (ref 98–111)
Chloride: 106 mmol/L (ref 98–111)
Creatinine, Ser: 0.62 mg/dL (ref 0.44–1.00)
Creatinine, Ser: 1 mg/dL (ref 0.44–1.00)
GFR, Estimated: >60 mL/min (ref >60)
GFR, Estimated: >60 mL/min (ref >60)
Glucose, Bld: 118 mg/dL — ABNORMAL HIGH (ref 70–99)
Glucose, Bld: 140 mg/dL — ABNORMAL HIGH (ref 70–99)
Potassium: 4 mmol/L (ref 3.5–5.1)
Potassium: 4.4 mmol/L (ref 3.5–5.1)
Sodium: 140 mmol/L (ref 135–145)
Sodium: 136 mmol/L (ref 135–145)

## 2021-04-22 LAB — BPAM FFP
Blood Product Expiration Date: 202205082359
ISSUE DATE / TIME: 202205051341
Unit Type and Rh: 6200

## 2021-04-22 LAB — MAGNESIUM
Magnesium: 2.7 mg/dL — ABNORMAL HIGH (ref 1.7–2.4)
Magnesium: 2.3 mg/dL (ref 1.7–2.4)

## 2021-04-22 MED ORDER — METOCLOPRAMIDE HCL 5 MG/ML IJ SOLN
10.0000 mg | Freq: Four times a day (QID) | INTRAMUSCULAR | Status: AC
  Administered 2021-04-22 – 2021-04-23 (×4): 10 mg via INTRAVENOUS
  Filled 2021-04-22 (×4): qty 2

## 2021-04-22 MED ORDER — INSULIN DETEMIR 100 UNIT/ML ~~LOC~~ SOLN
15.0000 [IU] | Freq: Once | SUBCUTANEOUS | Status: AC
  Administered 2021-04-22: 15 [IU] via SUBCUTANEOUS
  Filled 2021-04-22: qty 0.15

## 2021-04-22 MED ORDER — FUROSEMIDE 10 MG/ML IJ SOLN
40.0000 mg | Freq: Four times a day (QID) | INTRAMUSCULAR | Status: AC
  Administered 2021-04-22 (×3): 40 mg via INTRAVENOUS
  Filled 2021-04-22 (×3): qty 4

## 2021-04-22 MED ORDER — CLONAZEPAM 0.5 MG PO TABS
0.5000 mg | ORAL_TABLET | Freq: Two times a day (BID) | ORAL | Status: DC
  Administered 2021-04-22 – 2021-04-27 (×10): 0.5 mg via ORAL
  Filled 2021-04-22 (×11): qty 1

## 2021-04-22 MED ORDER — TRAMADOL HCL 50 MG PO TABS
50.0000 mg | ORAL_TABLET | ORAL | Status: DC | PRN
Start: 2021-04-22 — End: 2021-04-27
  Administered 2021-04-22 – 2021-04-27 (×15): 50 mg via ORAL
  Filled 2021-04-22 (×16): qty 1

## 2021-04-22 MED ORDER — INSULIN ASPART 100 UNIT/ML IJ SOLN
0.0000 [IU] | INTRAMUSCULAR | Status: DC
  Administered 2021-04-22: 2 [IU] via SUBCUTANEOUS

## 2021-04-22 MED ORDER — POTASSIUM CHLORIDE CRYS ER 20 MEQ PO TBCR
20.0000 meq | EXTENDED_RELEASE_TABLET | Freq: Three times a day (TID) | ORAL | Status: AC
  Administered 2021-04-22 (×3): 20 meq via ORAL
  Filled 2021-04-22 (×3): qty 1

## 2021-04-22 MED FILL — Thrombin (Recombinant) For Soln 20000 Unit: CUTANEOUS | Qty: 1 | Status: AC

## 2021-04-22 NOTE — Progress Notes (Signed)
1 Day Post-Op Procedure(s) (LRB): AORTIC VALVE REPLACEMENT (AVR) ON PUMP WITH 23MM INSPIRIS AORTIC VALVE (N/A) REPLACEMENT ASCENDING & ARCH AORTA WITH HEMASHIELD PLATINUM GRAFT 28 [id number] (N/A) TRANSESOPHAGEAL ECHOCARDIOGRAM (TEE) (N/A) AORTA -INNOMIATE BYPASS (N/A) AORTA LEFT COMMON CAROTID BYPASS (Left) Subjective: No complaints. Pain under control.  Objective: Vital signs in last 24 hours: Temp:  [93.56 F (34.2 C)-97.52 F (36.4 C)] 97.34 F (36.3 C) (05/06 0700) Pulse Rate:  [65-90] 90 (05/05 1520) Cardiac Rhythm: Atrial paced (05/06 0400) Resp:  [10-24] 11 (05/06 0700) BP: (82-121)/(60-92) 101/69 (05/06 0700) SpO2:  [93 %-100 %] 96 % (05/06 0700) Arterial Line BP: (81-153)/(46-79) 125/58 (05/06 0700) FiO2 (%):  [40 %-50 %] 40 % (05/06 0251) Weight:  [69.1 kg] 69.1 kg (05/06 0500)  Hemodynamic parameters for last 24 hours: PAP: (21-40)/(12-29) 27/15 CO:  [2.4 L/min-4 L/min] 3.8 L/min CI:  [1.5 L/min/m2-2.5 L/min/m2] 2.3 L/min/m2  Intake/Output from previous day: 05/05 0701 - 05/06 0700 In: 5199.4 [I.V.:2586.6; Blood:1148; IV Piggyback:1464.8] Out: 5165 [Urine:3595; Blood:1100; Chest Tube:470] Intake/Output this shift: No intake/output data recorded.  General appearance: alert and cooperative Neurologic: intact Heart: regular rate and rhythm, S1, S2 normal, no murmur Lungs: diminished breath sounds bibasilar Abdomen: soft, non-tender; bowel sounds normal Extremities: edema moderate Wound: dressings dry  Lab Results: Recent Labs    04/21/21 2113 04/21/21 2337 04/22/21 0445 04/22/21 0446  WBC 8.4  --  9.6  --   HGB 8.9*   < > 8.4* 8.2*  HCT 26.4*   < > 24.9* 24.0*  PLT 143*  --  141*  --    < > = values in this interval not displayed.   BMET:  Recent Labs    04/21/21 2113 04/21/21 2337 04/22/21 0445 04/22/21 0446  NA 141   < > 140 143  K 4.4   < > 4.0 4.1  CL 113*  --  110  --   CO2 25  --  24  --   GLUCOSE 116*  --  118*  --   BUN 13  --   14  --   CREATININE 0.62  --  0.62  --   CALCIUM 7.5*  --  8.1*  --    < > = values in this interval not displayed.    PT/INR:  Recent Labs    04/21/21 1551  LABPROT 18.2*  INR 1.5*   ABG    Component Value Date/Time   PHART 7.349 (L) 04/22/2021 0446   HCO3 23.0 04/22/2021 0446   TCO2 24 04/22/2021 0446   ACIDBASEDEF 3.0 (H) 04/22/2021 0446   O2SAT 92.0 04/22/2021 0446   CBG (last 3)  Recent Labs    04/22/21 0310 04/22/21 0359 04/22/21 0510  GLUCAP 145* 134* 138*   CXR: bilateral lower lobe atelectasis, interstitial edema.  ECG: sinus 82, 1st degree AV block.   Assessment/Plan: S/P Procedure(s) (LRB): AORTIC VALVE REPLACEMENT (AVR) ON PUMP WITH 23MM INSPIRIS AORTIC VALVE (N/A) REPLACEMENT ASCENDING & ARCH AORTA WITH HEMASHIELD PLATINUM GRAFT 28 [id number] (N/A) TRANSESOPHAGEAL ECHOCARDIOGRAM (TEE) (N/A) AORTA -INNOMIATE BYPASS (N/A) AORTA LEFT COMMON CAROTID BYPASS (Left)  POD 1 Hemodynamically stable in sinus rhythm. Still on NTG for HTN. Continue Lopressor and titrate upward as needed. Diuresis should help HTN.  Glucose under good control. No hx of DM and preop Hgb A1c 5.2. Usual postop hyperglycemia due to steroids. Will give a dose of Levemir this am only, DC insulin drip and start SSI. Probably be able to discontinue quickly.  Expected postop  volume excess: 18 lbs over preop. Start diuresis and replace K+  Will keep chest tubes in for now.  DC arterial lines, swan.  OOB, IS, Mobilize.     LOS: 1 day    Gaye Pollack 04/22/2021

## 2021-04-22 NOTE — Procedures (Signed)
Extubation Procedure Note  Patient Details:   Name: Deborah Koch DOB: [birth year] MRN: [id number]   Airway Documentation:  Airway 7.5 mm (Active)  Secured at (cm) 23 cm 04/21/21 2332  Measured From Lips 04/21/21 2332  Secured Location Right 04/21/21 2332  Secured By [employer] 04/21/21 2332  Tube Holder Repositioned Yes 04/21/21 2332  Prone position No 04/21/21 2332  Cuff Pressure (cm H2O) Clear OR 27-39 CmH2O 04/21/21 2332  Site Condition Dry 04/21/21 2332   Vent end date: (not recorded) Vent end time: (not recorded)   Evaluation  O2 sats: stable throughout Complications: No apparent complications Patient did tolerate procedure well. Bilateral Breath Sounds: Diminished   Yes  Pt. Extubated to 4L [name], pt. Able to say name, positive cuff leak. Rhonchi bilateral breath sounds. Pt. Instructed on how to use IS, pt. Able to achieve 557ml.  VC- 670, nif -30. RT will continue to monitor   Gifford Shave 04/22/2021, 3:28 AM

## 2021-04-22 NOTE — Addendum Note (Signed)
Addendum  created 04/22/21 0943 by Josephine Igo, CRNA   Order list changed, Pharmacy for encounter modified

## 2021-04-22 NOTE — Plan of Care (Signed)

## 2021-04-22 NOTE — Progress Notes (Signed)
[facility] BRIEF SICU PROGRESS NOTE  1 Day Post-Op  S/P Procedure(s) (LRB): AORTIC VALVE REPLACEMENT (AVR) ON PUMP WITH 23MM INSPIRIS AORTIC VALVE (N/A) REPLACEMENT ASCENDING & ARCH AORTA WITH HEMASHIELD PLATINUM GRAFT 28 [id number] (N/A) TRANSESOPHAGEAL ECHOCARDIOGRAM (TEE) (N/A) AORTA -INNOMIATE BYPASS (N/A) AORTA LEFT COMMON CAROTID BYPASS (Left)   Stable day NSR w/ stable BP Breathing comfortably Diuresing well  Plan: Continue current plan  Rexene Alberts, MD 04/22/2021 6:48 PM

## 2021-04-22 NOTE — Hospital Course (Addendum)
History of Present Illness  This 65 year old woman has severe bicuspid aortic valve stenosis with a 4.7 cm fusiform ascending aortic aneurysm.  She has New York Heart Association class II symptoms of exertional fatigue consistent with chronic diastolic congestive heart failure as well as some positional dizziness and intermittent chest discomfort at rest.  I have personally reviewed her 2D echocardiogram, cardiac catheterization, and CTA studies.  Her echocardiogram shows a bicuspid aortic valve with thickening and restricted leaflet motion.  Her mean gradient is increased from 30 to 45 mmHg over the past year.  Left ventricular systolic function is normal.  CTA of the chest shows a 4.7 cm fusiform ascending aortic aneurysm that extends from the level of the sinotubular junction up to the proximal aortic arch.  The aorta adjacent to the innominate artery is still 4.1 cm and does not return to normal until just beyond the left common carotid artery.  Cardiac catheterization shows nonobstructive coronary disease.  I think the best treatment for her is aortic valve replacement using a bioprosthetic valve and replacement of the ascending aorta and proximal aortic arch from the level of the sinotubular junction to the mid aortic arch between the left common carotid and left subclavian arteries.  This will require right axillary artery cannulation and circulatory arrest.  She will require bilateral radial arterial lines for blood pressure monitoring.  The sinuses of Valsalva are fairly normal and I do not think she requires aortic root replacement.  I reviewed all the studies with her and her sister and answered all their questions. I discussed the operative procedure with the patient and sister including alternatives, benefits and risks; including but not limited to bleeding, blood transfusion, infection, stroke, myocardial infarction, graft failure, heart block requiring a permanent pacemaker, organ dysfunction, and  death.  Deborah Koch understands and agrees to proceed.    Course in Hospital  Deborah Koch was admitted for elective surgery on 04/21/21 and after usual pre-operative preparation was taken to the OR where aortic valve replacement was accomplished along with replacement of the ascending aorta and aortic arch with aorto-innominate and aorto-left carotid bypass using a 28 x 10 x 8 x 8 x 10 mm Hemashield Platinum graft.  Following the procedure she was weaned fro cardiopulmonary bypass without any inotropic support being required. She was transferred to the ICU in stable condition.  She remained hemodynamically stable. She was weaned from mechanical vent support and extubated in the early morning hours of the first post-op day. A nitroglycerine infusion was added for BP control. The monitoring lines were removed on the first post-op day but the chest tubes were left in place for drainage. Diuresis was initiated for 18lb post-op volume excess. She was mobilized.  She was transferred to the progressive care unit on 04/23/2021.  She remained hypertensive and was restarted on her home antihypertensive agents.  Her pacing wires were removed without difficulty.

## 2021-04-23 ENCOUNTER — Inpatient Hospital Stay (HOSPITAL_COMMUNITY): Payer: Medicare Other

## 2021-04-23 LAB — BASIC METABOLIC PANEL
Anion gap: 5 (ref 5–15)
BUN: 20 mg/dL (ref 8–23)
CO2: 26 mmol/L (ref 22–32)
Calcium: 8.5 mg/dL — ABNORMAL LOW (ref 8.9–10.3)
Chloride: 105 mmol/L (ref 98–111)
Creatinine, Ser: 1.06 mg/dL — ABNORMAL HIGH (ref 0.44–1.00)
GFR, Estimated: 58 mL/min — ABNORMAL LOW (ref >60)
Glucose, Bld: 103 mg/dL — ABNORMAL HIGH (ref 70–99)
Potassium: 4.9 mmol/L (ref 3.5–5.1)
Sodium: 136 mmol/L (ref 135–145)

## 2021-04-23 LAB — CBC
HCT: 27.5 % — ABNORMAL LOW (ref 36.0–46.0)
Hemoglobin: 9.2 g/dL — ABNORMAL LOW (ref 12.0–15.0)
MCH: 30.3 pg (ref 26.0–34.0)
MCHC: 33.5 g/dL (ref 30.0–36.0)
MCV: 90.5 fL (ref 80.0–100.0)
Platelets: 109 10*3/uL — ABNORMAL LOW (ref 150–400)
RBC: 3.04 MIL/uL — ABNORMAL LOW (ref 3.87–5.11)
RDW: 18.9 % — ABNORMAL HIGH (ref 11.5–15.5)
WBC: 16 10*3/uL — ABNORMAL HIGH (ref 4.0–10.5)
nRBC: 0 % (ref 0.0–0.2)

## 2021-04-23 LAB — GLUCOSE, CAPILLARY
Glucose-Capillary: 108 mg/dL — ABNORMAL HIGH (ref 70–99)
Glucose-Capillary: 116 mg/dL — ABNORMAL HIGH (ref 70–99)
Glucose-Capillary: 119 mg/dL — ABNORMAL HIGH (ref 70–99)
Glucose-Capillary: 98 mg/dL (ref 70–99)

## 2021-04-23 MED ORDER — CLONAZEPAM 0.5 MG PO TABS
0.5000 mg | ORAL_TABLET | Freq: Two times a day (BID) | ORAL | Status: DC | PRN
  Administered 2021-04-25: 0.5 mg via ORAL
  Filled 2021-04-23: qty 1

## 2021-04-23 MED ORDER — FUROSEMIDE 40 MG PO TABS
40.0000 mg | ORAL_TABLET | Freq: Two times a day (BID) | ORAL | Status: DC
  Administered 2021-04-24 – 2021-04-27 (×7): 40 mg via ORAL
  Filled 2021-04-23 (×7): qty 1

## 2021-04-23 MED ORDER — INSULIN ASPART 100 UNIT/ML IJ SOLN: 0.0000 [IU] | Freq: Three times a day (TID) | INTRAMUSCULAR | Status: DC

## 2021-04-23 MED ORDER — LISINOPRIL 10 MG PO TABS
10.0000 mg | ORAL_TABLET | Freq: Every day | ORAL | Status: DC
  Administered 2021-04-23: 10 mg via ORAL
  Filled 2021-04-23: qty 1

## 2021-04-23 MED ORDER — ~~LOC~~ CARDIAC SURGERY, PATIENT & FAMILY EDUCATION: Freq: Once | Status: AC

## 2021-04-23 MED ORDER — ROSUVASTATIN CALCIUM 5 MG PO TABS
5.0000 mg | ORAL_TABLET | Freq: Every day | ORAL | Status: DC
  Administered 2021-04-23 – 2021-04-27 (×5): 5 mg via ORAL
  Filled 2021-04-23 (×5): qty 1

## 2021-04-23 MED ORDER — METOPROLOL TARTRATE 25 MG PO TABS
25.0000 mg | ORAL_TABLET | Freq: Two times a day (BID) | ORAL | Status: DC
  Administered 2021-04-23 (×2): 25 mg via ORAL
  Filled 2021-04-23 (×2): qty 1

## 2021-04-23 MED ORDER — FUROSEMIDE 10 MG/ML IJ SOLN
40.0000 mg | Freq: Four times a day (QID) | INTRAMUSCULAR | Status: AC
  Administered 2021-04-23 (×3): 40 mg via INTRAVENOUS
  Filled 2021-04-23 (×3): qty 4

## 2021-04-23 MED ORDER — ESCITALOPRAM OXALATE 10 MG PO TABS
10.0000 mg | ORAL_TABLET | Freq: Every day | ORAL | Status: DC
  Administered 2021-04-23 – 2021-04-27 (×5): 10 mg via ORAL
  Filled 2021-04-23 (×5): qty 1

## 2021-04-23 MED ORDER — POTASSIUM CHLORIDE CRYS ER 20 MEQ PO TBCR
20.0000 meq | EXTENDED_RELEASE_TABLET | Freq: Two times a day (BID) | ORAL | Status: DC
  Administered 2021-04-23 – 2021-04-27 (×9): 20 meq via ORAL
  Filled 2021-04-23 (×9): qty 1

## 2021-04-23 NOTE — Progress Notes (Signed)
      [address]       [place] [address]             [phone]        CARDIOTHORACIC SURGERY PROGRESS NOTE   R2 Days Post-Op Procedure(s) (LRB): AORTIC VALVE REPLACEMENT (AVR) ON PUMP WITH 23MM INSPIRIS AORTIC VALVE (N/A) REPLACEMENT ASCENDING & ARCH AORTA WITH HEMASHIELD PLATINUM GRAFT 28 [id number] (N/A) TRANSESOPHAGEAL ECHOCARDIOGRAM (TEE) (N/A) AORTA -INNOMIATE BYPASS (N/A) AORTA LEFT COMMON CAROTID BYPASS (Left)  Subjective: Doing very well.  Mild soreness in chest.  No SOB.  No nausea  Objective: Vital signs: BP Readings from Last 1 Encounters:  04/23/21 (!) 157/88   Pulse Readings from Last 1 Encounters:  04/23/21 75   Resp Readings from Last 1 Encounters:  04/23/21 13   Temp Readings from Last 1 Encounters:  04/23/21 98.6 F (37 C) (Oral)    Hemodynamics:    Physical Exam:  Rhythm:   sinus  Breath sounds: clear  Heart sounds:  RRR  Incisions:  Dressing dry, intact  Abdomen:  Soft, non-distended, non-tender  Extremities:  Warm, well-perfused  Chest tubes:  low volume thin serosanguinous output, no air leak    Intake/Output from previous day: 05/06 0701 - 05/07 0700 In: 1061.4 [P.O.:720; I.V.:241.4; IV Piggyback:100] Out: 2430 [Urine:2030; Chest Tube:400] Intake/Output this shift: No intake/output data recorded.  Lab Results:  CBC: Recent Labs    04/22/21 1826 04/23/21 0320  WBC 14.3* 16.0*  HGB 9.2* 9.2*  HCT 27.4* 27.5*  PLT 105* 109*    BMET:  Recent Labs    04/22/21 1826 04/23/21 0320  NA 136 136  K 4.4 4.9  CL 106 105  CO2 25 26  GLUCOSE 140* 103*  BUN 17 20  CREATININE 1.00 1.06*  CALCIUM 8.2* 8.5*     PT/INR:   Recent Labs    04/21/21 1551  LABPROT 18.2*  INR 1.5*    CBG (last 3)  Recent Labs    04/23/21 0018 04/23/21 0322 04/23/21 0651  GLUCAP 116* 119* 98    ABG    Component Value Date/Time   PHART 7.349 (L) 04/22/2021 0446   PCO2ART 41.4 04/22/2021 0446   PO2ART 66 (L)  04/22/2021 0446   HCO3 23.0 04/22/2021 0446   TCO2 24 04/22/2021 0446   ACIDBASEDEF 3.0 (H) 04/22/2021 0446   O2SAT 92.0 04/22/2021 0446    CXR: Bibasilar ATX +/- small right effusion  Assessment/Plan: S/P Procedure(s) (LRB): AORTIC VALVE REPLACEMENT (AVR) ON PUMP WITH 23MM INSPIRIS AORTIC VALVE (N/A) REPLACEMENT ASCENDING & ARCH AORTA WITH HEMASHIELD PLATINUM GRAFT 28 [id number] (N/A) TRANSESOPHAGEAL ECHOCARDIOGRAM (TEE) (N/A) AORTA -INNOMIATE BYPASS (N/A) AORTA LEFT COMMON CAROTID BYPASS (Left)  Doing very well POD2 Maintaining NSR w/ stable BP although intermittently hypertensive Breathing comfortably on nasal cannula Expected post op acute blood loss anemia, stable Expected post op atelectasis, mild R>L  Expected post op volume excess, weight reportedly 9 kg > preop, UOP adequate   Mobilize  D/C tubes and lines  Diuresis  Increase beta blocker and restart ACE-I at reduced dose  Transfer 4E   Rexene Alberts, MD 04/23/2021 8:26 AM

## 2021-04-23 NOTE — Progress Notes (Signed)
Pt arrived from unit from 2 heart. Pacing wires taped. Incision taped with4x4 ad tape . CHG given .Will continue to monitor. Pt. Oriented to unit   Phoebe Sharps, RN

## 2021-04-24 ENCOUNTER — Inpatient Hospital Stay (HOSPITAL_COMMUNITY): Payer: Medicare Other

## 2021-04-24 LAB — CBC
HCT: 26.7 % — ABNORMAL LOW (ref 36.0–46.0)
Hemoglobin: 8.9 g/dL — ABNORMAL LOW (ref 12.0–15.0)
MCH: 30.2 pg (ref 26.0–34.0)
MCHC: 33.3 g/dL (ref 30.0–36.0)
MCV: 90.5 fL (ref 80.0–100.0)
Platelets: 113 10*3/uL — ABNORMAL LOW (ref 150–400)
RBC: 2.95 MIL/uL — ABNORMAL LOW (ref 3.87–5.11)
RDW: 17.5 % — ABNORMAL HIGH (ref 11.5–15.5)
WBC: 13.3 10*3/uL — ABNORMAL HIGH (ref 4.0–10.5)
nRBC: 0 % (ref 0.0–0.2)

## 2021-04-24 LAB — BASIC METABOLIC PANEL
Anion gap: 6 (ref 5–15)
BUN: 18 mg/dL (ref 8–23)
CO2: 29 mmol/L (ref 22–32)
Calcium: 8.6 mg/dL — ABNORMAL LOW (ref 8.9–10.3)
Chloride: 100 mmol/L (ref 98–111)
Creatinine, Ser: 0.93 mg/dL (ref 0.44–1.00)
GFR, Estimated: >60 mL/min (ref >60)
Glucose, Bld: 115 mg/dL — ABNORMAL HIGH (ref 70–99)
Potassium: 4.4 mmol/L (ref 3.5–5.1)
Sodium: 135 mmol/L (ref 135–145)

## 2021-04-24 MED ORDER — METOPROLOL TARTRATE 50 MG PO TABS
50.0000 mg | ORAL_TABLET | Freq: Two times a day (BID) | ORAL | Status: DC
  Administered 2021-04-24 (×2): 50 mg via ORAL
  Filled 2021-04-24 (×3): qty 1

## 2021-04-24 MED ORDER — LISINOPRIL 10 MG PO TABS
20.0000 mg | ORAL_TABLET | Freq: Every day | ORAL | Status: DC
  Administered 2021-04-24 – 2021-04-27 (×4): 20 mg via ORAL
  Filled 2021-04-24 (×4): qty 2

## 2021-04-24 NOTE — Progress Notes (Signed)
Mobility Specialist: Progress Note   04/24/21 1818  Mobility  Activity Ambulated in hall  Level of Assistance Contact guard assist, steadying assist  Assistive Device Front wheel walker  Distance Ambulated (ft) 100 ft  Mobility Response Tolerated well  Mobility performed by Mobility specialist  $Mobility charge 1 Mobility   Pre-Mobility on 3 1/2 L/min: 71 HR, 96% SpO2 Post-Mobility on 2 L/min: 79 HR, 96% SpO2  Pt c/o general weakness during ambulation. Pt had no c/o pain or SOB during walk. Pt back to bed after walk and is set up to eat dinner.   [facility] Mobility Specialist Mobility Specialist Phone: [phone]

## 2021-04-24 NOTE — Progress Notes (Addendum)
      [address]       [place] [address]             [phone]      3 Days Post-Op Procedure(s) (LRB): AORTIC VALVE REPLACEMENT (AVR) ON PUMP WITH 23MM INSPIRIS AORTIC VALVE (N/A) REPLACEMENT ASCENDING & ARCH AORTA WITH HEMASHIELD PLATINUM GRAFT 28 [id number] (N/A) TRANSESOPHAGEAL ECHOCARDIOGRAM (TEE) (N/A) AORTA -INNOMIATE BYPASS (N/A) AORTA LEFT COMMON CAROTID BYPASS (Left)   Subjective:  Up in bed eating breakfast.  Doing pretty good.  Pain controlled.  + productive cough, + ambulation  + BM  Objective: Vital signs in last 24 hours: Temp:  [97.5 F (36.4 C)-98.3 F (36.8 C)] 98 F (36.7 C) (05/08 0800) Pulse Rate:  [78-86] 84 (05/08 0800) Cardiac Rhythm: Normal sinus rhythm (05/07 1900) Resp:  [11-19] 18 (05/08 0800) BP: (113-181)/(70-97) 158/82 (05/08 0800) SpO2:  [90 %-99 %] 95 % (05/08 0800) Weight:  [65.9 kg] 65.9 kg (05/08 0435)  Intake/Output from previous day: 05/07 0701 - 05/08 0700 In: 480 [P.O.:480] Out: 1430 [Urine:1400; Chest Tube:30]  General appearance: alert, cooperative and no distress Heart: regular rate and rhythm Lungs: clear to auscultation bilaterally Abdomen: soft, non-tender; bowel sounds normal; no masses,  no organomegaly Extremities: edema trace Wound: clean and dry  Lab Results: Recent Labs    04/23/21 0320 04/24/21 0050  WBC 16.0* 13.3*  HGB 9.2* 8.9*  HCT 27.5* 26.7*  PLT 109* 113*   BMET:  Recent Labs    04/23/21 0320 04/24/21 0050  NA 136 135  K 4.9 4.4  CL 105 100  CO2 26 29  GLUCOSE 103* 115*  BUN 20 18  CREATININE 1.06* 0.93  CALCIUM 8.5* 8.6*    PT/INR:  Recent Labs    04/21/21 1551  LABPROT 18.2*  INR 1.5*   ABG    Component Value Date/Time   PHART 7.349 (L) 04/22/2021 0446   HCO3 23.0 04/22/2021 0446   TCO2 24 04/22/2021 0446   ACIDBASEDEF 3.0 (H) 04/22/2021 0446   O2SAT 92.0 04/22/2021 0446   CBG (last 3)  Recent Labs    04/23/21 0322 04/23/21 0651 04/23/21 1132   GLUCAP 119* 98 108*    Assessment/Plan: S/P Procedure(s) (LRB): AORTIC VALVE REPLACEMENT (AVR) ON PUMP WITH 23MM INSPIRIS AORTIC VALVE (N/A) REPLACEMENT ASCENDING & ARCH AORTA WITH HEMASHIELD PLATINUM GRAFT 28 [id number] (N/A) TRANSESOPHAGEAL ECHOCARDIOGRAM (TEE) (N/A) AORTA -INNOMIATE BYPASS (N/A) AORTA LEFT COMMON CAROTID BYPASS (Left)  1. CV- NSR, + HTN- increase Lopressor to 50 mg BID, increase Lisinopril to home dose of 20 mg daily 2. Pulm- no acute issues, CTs out, CXR w/o pneumothorax. + right pleural effusion, off oxygen, continue IS 3. Renal- creatinine WNL, weight is trending down, continue Lasix as ordered, K at 4.4 4. Expected post operative blood loss anemia, mild Hgb stable at 8.9 5. Mild post operative thrombocytopenia, improving 6. Dispo- patient stable, titration of lopressor, lisinopril for BP control, continue diuretics, aggressive IS use, d/c EPW today, possibly for d/c in next 24-48 hours   LOS: 3 days    Ellwood Handler, PA-C 04/24/2021   I have seen and examined the patient and agree with the assessment and plan as outlined.  Doing very well.  Possibly ready for D/C home 1-2 days.  Rexene Alberts, MD 04/24/2021 10:32 AM

## 2021-04-24 NOTE — Progress Notes (Addendum)
Pacing wires removed per order and protocol.  Patient tolerated procedure well.  Bedrest 1 hr VS q15 min.

## 2021-04-25 LAB — TYPE AND SCREEN
ABO/RH(D): O POS
Antibody Screen: NEGATIVE
Unit division: 0
Unit division: 0
Unit division: 0
Unit division: 0
Unit division: 0
Unit division: 0

## 2021-04-25 LAB — BPAM RBC
Blood Product Expiration Date: 202205102359
Blood Product Expiration Date: 202205102359
Blood Product Expiration Date: 202206042359
Blood Product Expiration Date: 202206042359
Blood Product Expiration Date: 202206042359
Blood Product Expiration Date: 202206042359
ISSUE DATE / TIME: 202205050741
ISSUE DATE / TIME: 202205050741
ISSUE DATE / TIME: 202205051119
ISSUE DATE / TIME: 202205051119
ISSUE DATE / TIME: 202205051802
ISSUE DATE / TIME: 202205082153
Unit Type and Rh: 5100
Unit Type and Rh: 5100
Unit Type and Rh: 5100
Unit Type and Rh: 5100
Unit Type and Rh: 9500
Unit Type and Rh: 9500

## 2021-04-25 LAB — BASIC METABOLIC PANEL
Anion gap: 6 (ref 5–15)
BUN: 15 mg/dL (ref 8–23)
CO2: 30 mmol/L (ref 22–32)
Calcium: 8.8 mg/dL — ABNORMAL LOW (ref 8.9–10.3)
Chloride: 98 mmol/L (ref 98–111)
Creatinine, Ser: 0.78 mg/dL (ref 0.44–1.00)
GFR, Estimated: >60 mL/min (ref >60)
Glucose, Bld: 102 mg/dL — ABNORMAL HIGH (ref 70–99)
Potassium: 4.5 mmol/L (ref 3.5–5.1)
Sodium: 134 mmol/L — ABNORMAL LOW (ref 135–145)

## 2021-04-25 LAB — SURGICAL PATHOLOGY

## 2021-04-25 MED ORDER — METOPROLOL TARTRATE 50 MG PO TABS
75.0000 mg | ORAL_TABLET | Freq: Two times a day (BID) | ORAL | Status: DC
  Administered 2021-04-25 – 2021-04-27 (×5): 75 mg via ORAL
  Filled 2021-04-25 (×5): qty 1

## 2021-04-25 MED FILL — Potassium Chloride Inj 2 mEq/ML: INTRAVENOUS | Qty: 40 | Status: AC

## 2021-04-25 MED FILL — Magnesium Sulfate Inj 50%: INTRAMUSCULAR | Qty: 10 | Status: AC

## 2021-04-25 MED FILL — Sodium Bicarbonate IV Soln 8.4%: INTRAVENOUS | Qty: 50 | Status: AC

## 2021-04-25 MED FILL — Heparin Sodium (Porcine) Inj 1000 Unit/ML: INTRAMUSCULAR | Qty: 30 | Status: AC

## 2021-04-25 MED FILL — Mannitol IV Soln 20%: INTRAVENOUS | Qty: 500 | Status: AC

## 2021-04-25 MED FILL — Lidocaine HCl Local Soln Prefilled Syringe 100 MG/5ML (2%): INTRAMUSCULAR | Qty: 5 | Status: AC

## 2021-04-25 MED FILL — Albumin, Human Inj 5%: INTRAVENOUS | Qty: 250 | Status: AC

## 2021-04-25 MED FILL — Electrolyte-R (PH 7.4) Solution: INTRAVENOUS | Qty: 7000 | Status: AC

## 2021-04-25 MED FILL — Calcium Chloride Inj 10%: INTRAVENOUS | Qty: 10 | Status: AC

## 2021-04-25 NOTE — Progress Notes (Addendum)
CARDIAC REHAB PHASE I   PRE:  Rate/Rhythm: 80 SR    BP: lying 156/85    SaO2: 95 1L, 89-90 RA in bed  MODE:  Ambulation: 150 ft   POST:  Rate/Rhythm: 82 SR    BP: sitting left arm 78/58, recheck 96/62, back to bed 152/84 right arm    SaO2: 96 RA   Pt groggy. Able to follow commands but forgets easily what we are doing. Moving fairly well out of bed with verbal cues. However once walking pt with very slow steps. Needed help turning RW. Slow to respond and c/o weak legs. No buckling, able to support herself. Sat for rest after 30 ft. Able to stand and walk again, rest again after 75 more feet. Pt slow to process turning rollator, sitting, breaking. To recliner and BP 78/58. Denied dizziness. BP up with rest. Put back to bed per RN request. Pt practiced IS, 500 mL. BP now high albeit in other arm (RN checked for difference in arms earlier today). Encouraged IS and x2 more walks. On bed alarm and 1L in bed. [phone]  Darrick Meigs CES, ACSM 04/25/2021 10:59 AM

## 2021-04-25 NOTE — Discharge Instructions (Signed)

## 2021-04-25 NOTE — Discharge Summary (Signed)
Physician Discharge Summary  Patient ID: Deborah Koch MRN: [id number] DOB/AGE: 03-16-1956 65 y.o.  Admit date: 04/21/2021 Discharge date: 04/27/2021  Admission Diagnoses: Congenital bicuspid aortic valve Severe aortic valve stenosis Ascending aortic dilation Hypertension Dyslipidemia History of anxiety and depression Gastroesophageal reflux disease History of C. difficile infection   Discharge Diagnoses:   Congenital bicuspid aortic valve Severe aortic valve stenosis Ascending aortic dilation Hypertension Dyslipidemia History of anxiety and depression Gastroesophageal reflux disease History of C. difficile infection S/P ascending aortic aneurysm repair Expected acute blood loss anemia Hypoxia   Discharged Condition: stable  History of Present Illness  This 65 year old woman has severe bicuspid aortic valve stenosis with a 4.7 cm fusiform ascending aortic aneurysm.  She has New York Heart Association class II symptoms of exertional fatigue consistent with chronic diastolic congestive heart failure as well as some positional dizziness and intermittent chest discomfort at rest.  I have personally reviewed her 2D echocardiogram, cardiac catheterization, and CTA studies.  Her echocardiogram shows a bicuspid aortic valve with thickening and restricted leaflet motion.  Her mean gradient is increased from 30 to 45 mmHg over the past year.  Left ventricular systolic function is normal.  CTA of the chest shows a 4.7 cm fusiform ascending aortic aneurysm that extends from the level of the sinotubular junction up to the proximal aortic arch.  The aorta adjacent to the innominate artery is still 4.1 cm and does not return to normal until just beyond the left common carotid artery.  Cardiac catheterization shows nonobstructive coronary disease.  I think the best treatment for her is aortic valve replacement using a bioprosthetic valve and replacement of the ascending aorta and proximal  aortic arch from the level of the sinotubular junction to the mid aortic arch between the left common carotid and left subclavian arteries.  This will require right axillary artery cannulation and circulatory arrest.  She will require bilateral radial arterial lines for blood pressure monitoring.  The sinuses of Valsalva are fairly normal and I do not think she requires aortic root replacement.  I reviewed all the studies with her and her sister and answered all their questions. I discussed the operative procedure with the patient and sister including alternatives, benefits and risks; including but not limited to bleeding, blood transfusion, infection, stroke, myocardial infarction, graft failure, heart block requiring a permanent pacemaker, organ dysfunction, and death.  [name] understands and agrees to proceed.    Course in Hospital  Ms. Fuson was admitted for elective surgery on 04/21/21 and after usual pre-operative preparation was taken to the OR where aortic valve replacement was accomplished along with replacement of the ascending aorta and aortic arch with aorto-innominate and aorto-left carotid bypass using a 28 x 10 x 8 x 8 x 10 mm Hemashield Platinum graft.  Following the procedure she was weaned fro cardiopulmonary bypass without any inotropic support being required. She was transferred to the ICU in stable condition.  She remained hemodynamically stable. She was weaned from mechanical vent support and extubated in the early morning hours of the first post-op day. A nitroglycerine infusion was added for BP control. The monitoring lines were removed on the first post-op day but the chest tubes were left in place for drainage. Diuresis was initiated for 18lb post-op volume excess. She was mobilized.  She was transferred to the progressive care unit on 04/23/2021.  She remained hypertensive and was restarted on her home antihypertensive agents.  Her pacing wires were removed without  difficulty.  Her mobility gradually improved and supplemental oxygen was weaned off without difficulty.  She maintained a stable sinus rhythm.  She felt she was ready for discharge to home on today's date and we agree.   Consults: None  Significant Diagnostic Studies:   EXAM: CHEST - 2 VIEW  COMPARISON:  Chest x-ray 04/23/2021, CT chest 04/11/2021, chest x-ray 04/19/2021.  FINDINGS: Interval removal of a right internal jugular central venous Cordis. Interval removal of 2 mediastinal drains.  Retained pacing wires.  Prominent ascending thoracic aorta. Atherosclerotic plaque. The heart size and mediastinal contours are unchanged. Aortic valve replacement.  Elevation of the right hemidiaphragm with small volume pleural effusion. Likely trace to small volume left pleural effusion. Bibasilar atelectasis similar to prior. No pulmonary edema. No pleural effusion. No pneumothorax.  No acute osseous abnormality.  IMPRESSION: 1. Similar-appearing bilateral, right greater than left, pleural effusions with bibasilar atelectasis. 2. Other than line and tube removal, no change compared to chest x-ray 04/23/2021.   Electronically Signed   By: Iven Finn M.D.   On: 04/24/2021 06:52   Treatments:   CARDIOVASCULAR SURGERY OPERATIVE NOTE  10/01/2014  Surgeon:  Gaye Pollack, MD  First Assistant: Jadene Pierini,  PA-C   Preoperative Diagnosis:  Severe bicuspid aortic valve stenosis with ascending aorta and arch aneurysm.  Postoperative Diagnosis:  Same   Procedure:  1. Median Sternotomy 2. Right axillary artery cannulation using an 8 mm Hemashield graft. 3.   Extracorporeal circulation via the right axillary artery and right atrium 4.   Replacement of ascending aorta and aortic arch with aorto-innominate and aorto-left carotid bypass using a 28 x 10 x 8 x 8 x 10 mm Hemashield Platinum graft.  Supra-coronary proximal anastomosis. 5.   Aortic valve  replacement using a 23 mm Edwards INSPIRIS RESILIA pericardial valve.    Anesthesia:  General Endotracheal  Anesthesiologist: Dr. Renold Don    Clinical History/Surgical Indication:  This 65 year old woman has severe bicuspid aortic valve stenosis with a 4.7 cm fusiform ascending aortic aneurysm. She has New York Heart Association class II symptoms of exertional fatigue consistent with chronic diastolic congestive heart failure as well as some positional dizziness and intermittent chest discomfort at rest. I have personally reviewed her 2D echocardiogram, cardiac catheterization, and CTA studies. Her echocardiogram shows a bicuspid aortic valve with thickening and restricted leaflet motion. Her mean gradient is increased from 30 to 45 mmHg over the past year. Left ventricular systolic function is normal. CTA of the chest shows a 4.7 cm fusiform ascending aortic aneurysm that extends from the level of the sinotubular junction up to the proximal aortic arch. The aorta adjacent to the innominate artery is still 4.1 cm and does not return to normal until just beyondthe left common carotid artery. Cardiac catheterization shows nonobstructive coronary disease. I think the best treatment for her is aortic valve replacement using a bioprosthetic valve and replacement of the ascending aorta and proximal aortic arch from the level of the sinotubular junction to the mid aortic arch between the left common carotid and left subclavian arteries. This will require right axillary artery cannulation and circulatory arrest. She will require bilateral radial arterial lines for blood pressure monitoring. The sinuses of Valsalva are fairly normal and I do not think she requires aortic root replacement. I reviewed all the studies with her and her sister and answered all their questions. I discussed the operative procedure with the patient andsisterincluding alternatives, benefits and risks; including  but not limited to bleeding, blood  transfusion, infection, stroke, myocardial infarction, graft failure, heart block requiring a permanent pacemaker, organ dysfunction, and death. Vedia Coffer Lattaunderstands and agrees to proceed.   Discharge Exam: Blood pressure (!) 148/85, pulse 76, temperature 97.9 F (36.6 C), temperature source Oral, resp. rate 18, height 5\' 2"  (1.575 m), weight 62.7 kg, SpO2 95 %.  General appearance: alert, cooperative and no distress Neurologic: intact Heart: regular rate and rhythm and NSR. No significant arrhythmias on monitor review. Lungs: Breath sounds are clear anterior.  Abdomen: soft and NT, active bowel sounds. Extremities: well perfused, no peripheral edema.  Wound: the sternotomy incision is intact and dry. Pacer wires are out.    Disposition: Ms. August is discharged to home is stable condition.    Allergies as of 04/27/2021      Reactions   Vicodin [hydrocodone-acetaminophen] Hives, Rash      Medication List    STOP taking these medications   metoprolol succinate 100 MG 24 hr tablet Commonly known as: TOPROL-XL     TAKE these medications   aspirin 325 MG EC tablet Take 1 tablet (325 mg total) by mouth daily.   clonazePAM 0.5 MG tablet Commonly known as: KLONOPIN TAKE 1 TABLET BY MOUTH TWICE A DAY AS NEEDED What changed: when to take this   escitalopram 10 MG tablet Commonly known as: LEXAPRO Take 1 tablet (10 mg total) by mouth daily. What changed: when to take this   lisinopril 20 MG tablet Commonly known as: ZESTRIL Take 1 tablet (20 mg total) by mouth daily. What changed: when to take this   Metoprolol Tartrate 75 MG Tabs Take 75 mg by mouth 2 (two) times daily.   rosuvastatin 5 MG tablet Commonly known as: CRESTOR Take 1 tablet (5 mg total) by mouth daily. What changed: when to take this   traMADol 50 MG tablet Commonly known as: ULTRAM Take 1 tablet (50 mg total) by mouth every 4 (four) hours as needed for up to  5 days for moderate pain.            Durable Medical Equipment  (From admission, onward)         Start     Ordered   04/27/21 0722  For home use only DME Walker rolling  Once       Question Answer Comment  Walker: With [place] Wheels   Patient needs a walker to treat with the following condition Imbalance      04/27/21 0721          Follow-up Information    [facility]. Go on 05/05/2021.   Specialty: Cardiothoracic Surgery Why: Your appointment for suture removal is at 11am Contact information: [place], [place] [phone]       Gaye Pollack, MD. Go on 05/19/2021.   Specialty: Cardiothoracic Surgery Why: Your appointment is at 3:30.   Please arrive 30 minutes early for a chest x-ray to be performed by [facility] located on te first floor of the same building.  Contact information: [place] [address] [phone]        Rise Mu, PA-C. Go on 05/09/2021.   Specialties: Physician Assistant, Cardiology, Radiology Why: Your appointment is at 9:30am. Contact information: [address] [place] [address] [phone]        [facility]. Go on 06/06/2021.   Specialty: Cardiology Why: Your follow up ECHOCARDIOGRAM is  at 10:30am. Contact information: [address], [place] [phone]             The patient has been discharged on:   1.Beta Blocker:  Yes [  x ]                              No   [   ]                              If No, reason:  2.Ace Inhibitor/ARB: Yes [x  ]                                     No  [    ]                                     If No, reason:  3.Statin:   Yes [x   ]                  No  [   ]                  If No, reason:  4.Ecasa:  Yes  [x  ]                  No   [   ]                  If No,  reason:     Signed: Antony Odea, PA-C 04/27/2021, 8:17 AM

## 2021-04-25 NOTE — Progress Notes (Signed)
Mobility Specialist - Progress Note   04/25/21 1427  Mobility  Activity Stood at bedside  Level of Assistance Standby assist, set-up cues, supervision of patient - no hands on  Assistive Device Four wheel walker  Mobility Response Tolerated poorly  Mobility performed by Mobility specialist  $Mobility charge 1 Mobility   Pre-mobility:       Laying: 76 HR, 147/114 BP, 92% SpO2      Standing (did not finish standing): 71 HR, 144/100 BP Post-mobility: 78 HR, 93% SpO2  Pt c/o a HA on the "top of her head" that she rated a 7/10, this didn't worsen when standing. Pt did not complete orthostatics when standing due to nausea. She differed further mobility and got back into bed. Pt on RA throughout. RN notified of HA and nausea.   Pricilla Handler Mobility Specialist Mobility Specialist Phone: [phone]

## 2021-04-25 NOTE — Progress Notes (Addendum)
      [address]       [place] [address]             [phone]      4 Days Post-Op Procedure(s) (LRB): AORTIC VALVE REPLACEMENT (AVR) ON PUMP WITH 23MM INSPIRIS AORTIC VALVE (N/A) REPLACEMENT ASCENDING & ARCH AORTA WITH HEMASHIELD PLATINUM GRAFT 28 [id number] (N/A) TRANSESOPHAGEAL ECHOCARDIOGRAM (TEE) (N/A) AORTA -INNOMIATE BYPASS (N/A) AORTA LEFT COMMON CAROTID BYPASS (Left) Subjective: Awake and alert, had some chest soreness last night but is feeling better this morning.  BM Saturday.    Objective: Vital signs in last 24 hours: Temp:  [97.5 F (36.4 C)-98.2 F (36.8 C)] 98.2 F (36.8 C) (05/09 0500) Pulse Rate:  [71-88] 73 (05/09 0500) Cardiac Rhythm: Normal sinus rhythm (05/08 1930) Resp:  [10-20] 10 (05/09 0500) BP: (112-199)/(65-104) 199/99 (05/09 0724) SpO2:  [94 %-100 %] 97 % (05/09 0500) Weight:  [61.7 kg] 61.7 kg (05/09 0356)      Intake/Output from previous day: 05/08 0701 - 05/09 0700 In: 3 [I.V.:3] Out: -  Intake/Output this shift: No intake/output data recorded.  General appearance: alert, cooperative and no distress Neurologic: intact Heart: regular rate and rhythm and NSR with frequent PVC's on monitor Lungs: Breath soundss are clear anterior. CXR from yesterday shows basilar ATX Abdomen: soft and NT, active bowel sounds. Extremities: well perfused, no peripheral edema.  Wound: the sternotomy incision is intact and dry. Pacer wires are out.   Lab Results: Recent Labs    04/23/21 0320 04/24/21 0050  WBC 16.0* 13.3*  HGB 9.2* 8.9*  HCT 27.5* 26.7*  PLT 109* 113*   BMET:  Recent Labs    04/24/21 0050 04/25/21 0518  NA 135 134*  K 4.4 4.5  CL 100 98  CO2 29 30  GLUCOSE 115* 102*  BUN 18 15  CREATININE 0.93 0.78  CALCIUM 8.6* 8.8*    PT/INR: No results for input(s): LABPROT, INR in the last 72 hours. ABG    Component Value Date/Time   PHART 7.349 (L) 04/22/2021 0446   HCO3 23.0 04/22/2021 0446   TCO2 24  04/22/2021 0446   ACIDBASEDEF 3.0 (H) 04/22/2021 0446   O2SAT 92.0 04/22/2021 0446   CBG (last 3)  Recent Labs    04/23/21 0322 04/23/21 0651 04/23/21 1132  GLUCAP 119* 98 108*    Assessment/Plan: S/P Procedure(s) (LRB): AORTIC VALVE REPLACEMENT (AVR) ON PUMP WITH 23MM INSPIRIS AORTIC VALVE (N/A) REPLACEMENT ASCENDING & ARCH AORTA WITH HEMASHIELD PLATINUM GRAFT 28 [id number] (N/A) TRANSESOPHAGEAL ECHOCARDIOGRAM (TEE) (N/A) AORTA -INNOMIATE BYPASS (N/A) AORTA LEFT COMMON CAROTID BYPASS (Left)  -POD4 AVR and ascending aortic replacement.  Progressing slowly with ambulation. Hypertensive this morning despite being on her usual antihypertensive medications. Will increase the metoprolol to 75mg  BID.   -Hypoxia- still requiring O2 at 2L/[place]. Has small bilateral effusion and bibasilar ATX. Only pulling 250cc on IS.  Need to work on pulmonary hygiene.   -Expected acute blood loss anemia- Hct reasonably stable.   -Disposition- planning discharge to home when BP controlled and weaned off O2.    LOS: 4 days    Deborah Koch, Deborah Koch [phone] 04/25/2021   Chart reviewed, patient examined, agree with above. Sats 90% this afternoon off oxygen. Encouraged to work on [place].  Lopressor increased this am for HTN. Can increase lisinopril to 40 mg if needed.

## 2021-04-26 NOTE — Care Management Important Message (Signed)
Important Message  Patient Details  Name: Deborah Koch MRN: [id number] Date of Birth: November 19, 1956   Medicare Important Message Given:  Yes     Shelda Altes 04/26/2021, 10:52 AM

## 2021-04-26 NOTE — Progress Notes (Signed)
CARDIAC REHAB PHASE I   PRE:  Rate/Rhythm: 76 SR    BP: lying 115/73, sitting 122/77, standing 102/70    SaO2: 91-93 RA  MODE:  Ambulation: 270 ft   POST:  Rate/Rhythm: 85 SR    BP: sitting 110/80     SaO2: 92 RA  Pt much improved today. Took orthostatics, which were stable. Slow pace walking with rollator, gait belt, standby assist x2. Consistently veered to right side, pt could not correct this herself (blames this on scoliosis). Standing rest x2. Fatigued toward end and needed repetition in directions of going to chair and how to turn. VSS.  Practiced IS briefly, 500 mL. Has difficulty understanding mechanics. Encouraged x2 more walks today. Will need RW for home. [place], ACSM 04/26/2021 9:30 AM

## 2021-04-26 NOTE — Progress Notes (Signed)
Mobility Specialist: Progress Note   04/26/21 1515  Mobility  Activity Ambulated in room  Level of Assistance Minimal assist, patient does 75% or more  Assistive Device Front wheel walker  Distance Ambulated (ft) 30 ft  Mobility Response Tolerated well  Mobility performed by Mobility specialist  $Mobility charge 1 Mobility   Pre-Mobility: 71 HR During Mobility: 87% SpO2 Post-Mobility: 72 HR, 111/74 BP, 88-89% SpO2  Distance limited due to pt fatigue. Pt said she forgot to order lunch so assisted pt with placing order. Pt struggled to dial number when read off to her, RN notified. Pt desat to 87% during ambulation, place don 1.5L O2 flow. Sats returned to mid 90%s. Pt back in bed with bed alarm on.   [facility] Eh Sesay Mobility Specialist Mobility Specialist Phone: [phone]

## 2021-04-26 NOTE — Progress Notes (Signed)
Patient sleeping on Rm. Air and 02 sats down to 87%. O2 at 1 l/m [place]. applied sats up in mid 90's

## 2021-04-26 NOTE — Progress Notes (Addendum)
      [address]       [place] [address]             [phone]      5 Days Post-Op Procedure(s) (LRB): AORTIC VALVE REPLACEMENT (AVR) ON PUMP WITH 23MM INSPIRIS AORTIC VALVE (N/A) REPLACEMENT ASCENDING & ARCH AORTA WITH HEMASHIELD PLATINUM GRAFT 28 [id number] (N/A) TRANSESOPHAGEAL ECHOCARDIOGRAM (TEE) (N/A) AORTA -INNOMIATE BYPASS (N/A) AORTA LEFT COMMON CAROTID BYPASS (Left) Subjective: Awake and alert, says she feels much better today. No on RA with O2 sat 96%.     Objective: Vital signs in last 24 hours: Temp:  [97.8 F (36.6 C)-98.6 F (37 C)] 97.9 F (36.6 C) (05/10 0441) Pulse Rate:  [67-78] 67 (05/10 0441) Cardiac Rhythm: Normal sinus rhythm (05/10 0755) Resp:  [11-20] 12 (05/10 0441) BP: (114-170)/(75-126) 160/98 (05/10 0441) SpO2:  [93 %-98 %] 97 % (05/10 0441) Weight:  [65.1 kg] 65.1 kg (05/10 0441)      Intake/Output from previous day: 05/09 0701 - 05/10 0700 In: 240 [P.O.:240] Out: -  Intake/Output this shift: No intake/output data recorded.  General appearance: alert, cooperative and no distress Neurologic: intact Heart: regular rate and rhythm and NSR had a 9-beat run of WCT early this am. No in SR with a few PVC's Lungs: Breath sounds are clear anterior.  Abdomen: soft and NT, active bowel sounds. Extremities: well perfused, no peripheral edema.  Wound: the sternotomy incision is intact and dry. Pacer wires are out.   Lab Results: Recent Labs    04/24/21 0050  WBC 13.3*  HGB 8.9*  HCT 26.7*  PLT 113*   BMET:  Recent Labs    04/24/21 0050 04/25/21 0518  NA 135 134*  K 4.4 4.5  CL 100 98  CO2 29 30  GLUCOSE 115* 102*  BUN 18 15  CREATININE 0.93 0.78  CALCIUM 8.6* 8.8*    PT/INR: No results for input(s): LABPROT, INR in the last 72 hours. ABG    Component Value Date/Time   PHART 7.349 (L) 04/22/2021 0446   HCO3 23.0 04/22/2021 0446   TCO2 24 04/22/2021 0446   ACIDBASEDEF 3.0 (H) 04/22/2021 0446   O2SAT  92.0 04/22/2021 0446   CBG (last 3)  Recent Labs    04/23/21 1132  GLUCAP 108*    Assessment/Plan: S/P Procedure(s) (LRB): AORTIC VALVE REPLACEMENT (AVR) ON PUMP WITH 23MM INSPIRIS AORTIC VALVE (N/A) REPLACEMENT ASCENDING & ARCH AORTA WITH HEMASHIELD PLATINUM GRAFT 28 [id number] (N/A) TRANSESOPHAGEAL ECHOCARDIOGRAM (TEE) (N/A) AORTA -INNOMIATE BYPASS (N/A) AORTA LEFT COMMON CAROTID BYPASS (Left)  -POD5 AVR and ascending aortic replacement.  Progressing slowly with ambulation. BP is better today. She became hypotensive after receiving medications yesterday morning.  Will hold off on medication changes today.   -Hypoxia-much improved. Used 1Lnc part of the night but now on RA with acceptable saturations.    -Expected acute blood loss anemia- Hct stabilized, no new lab today.  -Disposition- planning discharge to home tomorrow.    LOS: 5 days    Malon Kindle [phone] 04/26/2021    Chart reviewed, patient examined, agree with above. Feels much better today. BP under better control. Maintaining sinus rhythm. Plan home in am with sister if no changes.

## 2021-04-26 NOTE — Progress Notes (Signed)
Patient went to bathroom and had short run 9 beats W.C.T. then back to S.R. no complaints voiced

## 2021-04-27 MED ORDER — METOPROLOL TARTRATE 75 MG PO TABS: 75.0000 mg | ORAL_TABLET | Freq: Two times a day (BID) | ORAL | 1 refills | Status: DC

## 2021-04-27 MED ORDER — TRAMADOL HCL 50 MG PO TABS: 50.0000 mg | ORAL_TABLET | ORAL | 0 refills | Status: DC | PRN

## 2021-04-27 MED ORDER — ASPIRIN 325 MG PO TBEC: 325.0000 mg | DELAYED_RELEASE_TABLET | Freq: Every day | ORAL | 0 refills | Status: DC

## 2021-04-27 NOTE — Progress Notes (Signed)
Discussed sternal precautions, IS, exercise, smoking cessation, diet, and CRPII with pt and sister. Good reception. Sister confirms that pt historically is slow to recover after any hospital admit or procedure. Discussed with sister pts forgetfulness and weakness. Will refer to [place].  [place], ACSM 11:41 AM 04/27/2021

## 2021-04-27 NOTE — Progress Notes (Addendum)
CARDIAC REHAB PHASE I   PRE:  Rate/Rhythm: 64 SR    BP: lying 131/85, sitting 116/64, standing 110/73    SaO2: 97 RA  MODE:  Ambulation: 90 ft   POST:  Rate/Rhythm: 80 SR    BP: sitting 97/55     SaO2: 97 RA  Pt feeling fatigued today. Encouraged to ambulate since she did poorly yesterday afternoon. Took orthostatics, which there was change but pt did not complain. Slow pace with RW. Veered right, c/o fatigue. Declined sitting but did not want to walk far. When asked to straighten up, pt walked into left corner of wall, did not think to correct herself. Then she walked past her room, needed VCs to know what steps to take to navigate obstacles. BP low after walk. Pt consistently needs reminders and is forgetful. She thought she ordered breakfast and asked where it was several times. To recliner after prompting. Will await sister for education. [phone]   [place], ACSM 04/27/2021 10:04 AM

## 2021-04-27 NOTE — Progress Notes (Signed)
      [address]       [place] [address]             [phone]      6 Days Post-Op Procedure(s) (LRB): AORTIC VALVE REPLACEMENT (AVR) ON PUMP WITH 23MM INSPIRIS AORTIC VALVE (N/A) REPLACEMENT ASCENDING & ARCH AORTA WITH HEMASHIELD PLATINUM GRAFT 28 [id number] (N/A) TRANSESOPHAGEAL ECHOCARDIOGRAM (TEE) (N/A) AORTA -INNOMIATE BYPASS (N/A) AORTA LEFT COMMON CAROTID BYPASS (Left) Subjective: Awake and alert, says she feels good. No new problems or concerns.  She feels she is ready to return home.   Objective: Vital signs in last 24 hours: Temp:  [97.9 F (36.6 C)-98.8 F (37.1 C)] 98.8 F (37.1 C) (05/11 0344) Pulse Rate:  [65-74] 67 (05/11 0344) Cardiac Rhythm: Normal sinus rhythm (05/10 2040) Resp:  [16-18] 18 (05/11 0344) BP: (102-156)/(64-89) 152/89 (05/11 0344) SpO2:  [92 %-95 %] 93 % (05/11 0344) Weight:  [62.7 kg] 62.7 kg (05/11 0344)    Intake/Output from previous day: 05/10 0701 - 05/11 0700 In: 780 [P.O.:780] Out: -  Intake/Output this shift: No intake/output data recorded.  General appearance: alert, cooperative and no distress Neurologic: intact Heart: regular rate and rhythm and NSR. No significant arrhythmias on monitor review. Lungs: Breath sounds are clear anterior.  Abdomen: soft and NT, active bowel sounds. Extremities: well perfused, no peripheral edema.  Wound: the sternotomy incision is intact and dry. Pacer wires are out.   Lab Results: No results for input(s): WBC, HGB, HCT, PLT in the last 72 hours. BMET:  Recent Labs    04/25/21 0518  NA 134*  K 4.5  CL 98  CO2 30  GLUCOSE 102*  BUN 15  CREATININE 0.78  CALCIUM 8.8*    PT/INR: No results for input(s): LABPROT, INR in the last 72 hours. ABG    Component Value Date/Time   PHART 7.349 (L) 04/22/2021 0446   HCO3 23.0 04/22/2021 0446   TCO2 24 04/22/2021 0446   ACIDBASEDEF 3.0 (H) 04/22/2021 0446   O2SAT 92.0 04/22/2021 0446   CBG (last 3)  No results  for input(s): GLUCAP in the last 72 hours.  Assessment/Plan: S/P Procedure(s) (LRB): AORTIC VALVE REPLACEMENT (AVR) ON PUMP WITH 23MM INSPIRIS AORTIC VALVE (N/A) REPLACEMENT ASCENDING & ARCH AORTA WITH HEMASHIELD PLATINUM GRAFT 28 [id number] (N/A) TRANSESOPHAGEAL ECHOCARDIOGRAM (TEE) (N/A) AORTA -INNOMIATE BYPASS (N/A) AORTA LEFT COMMON CAROTID BYPASS (Left)  -POD6 AVR and ascending aortic replacement.  Did much better with ambulation yesterday.  BP control is satisfactory.  -Hypoxia-much improved. Used 1Lnc again for part of the night but now on RA with acceptable saturations.    -Expected acute blood loss anemia- Hct stabilized, no new lab today.  -Disposition- planning discharge to home today. A rolling walker was recommended by the rehab team and has been ordered.    LOS: 6 days    Antony Odea, PA-C [phone] 04/27/2021

## 2021-04-29 ENCOUNTER — Telehealth (HOSPITAL_COMMUNITY): Payer: Self-pay

## 2021-04-29 NOTE — Telephone Encounter (Signed)
Per Phase I cardiac rehab, fax cardiac rehab referral to [facility].

## 2021-05-02 ENCOUNTER — Emergency Department (HOSPITAL_COMMUNITY): Payer: Medicare Other

## 2021-05-02 ENCOUNTER — Inpatient Hospital Stay (HOSPITAL_COMMUNITY): Payer: Medicare Other

## 2021-05-02 ENCOUNTER — Encounter (HOSPITAL_COMMUNITY): Payer: Self-pay | Admitting: Critical Care Medicine

## 2021-05-02 ENCOUNTER — Inpatient Hospital Stay (HOSPITAL_COMMUNITY)
Admission: EM | Admit: 2021-05-02 | Discharge: 2021-05-08 | DRG: 871 | Disposition: A | Discharge status: Home Health | Payer: Medicare Other | Attending: Internal Medicine | Admitting: Internal Medicine

## 2021-05-02 DIAGNOSIS — Z781 Physical restraint status: Secondary | ICD-10-CM

## 2021-05-02 DIAGNOSIS — I480 Paroxysmal atrial fibrillation: Secondary | ICD-10-CM | POA: Diagnosis present

## 2021-05-02 DIAGNOSIS — Z803 Family history of malignant neoplasm of breast: Secondary | ICD-10-CM

## 2021-05-02 DIAGNOSIS — Z800 Family history of malignant neoplasm of digestive organs: Secondary | ICD-10-CM

## 2021-05-02 DIAGNOSIS — R6889 Other general symptoms and signs: Secondary | ICD-10-CM | POA: Diagnosis not present

## 2021-05-02 DIAGNOSIS — F19231 Other psychoactive substance dependence with withdrawal delirium: Secondary | ICD-10-CM | POA: Diagnosis not present

## 2021-05-02 DIAGNOSIS — E785 Hyperlipidemia, unspecified: Secondary | ICD-10-CM | POA: Diagnosis not present

## 2021-05-02 DIAGNOSIS — I4819 Other persistent atrial fibrillation: Secondary | ICD-10-CM | POA: Diagnosis present

## 2021-05-02 DIAGNOSIS — K219 Gastro-esophageal reflux disease without esophagitis: Secondary | ICD-10-CM | POA: Diagnosis not present

## 2021-05-02 DIAGNOSIS — Z811 Family history of alcohol abuse and dependence: Secondary | ICD-10-CM | POA: Diagnosis not present

## 2021-05-02 DIAGNOSIS — I161 Hypertensive emergency: Secondary | ICD-10-CM | POA: Diagnosis not present

## 2021-05-02 DIAGNOSIS — E872 Acidosis: Secondary | ICD-10-CM | POA: Diagnosis not present

## 2021-05-02 DIAGNOSIS — J9601 Acute respiratory failure with hypoxia: Secondary | ICD-10-CM | POA: Diagnosis present

## 2021-05-02 DIAGNOSIS — I309 Acute pericarditis, unspecified: Secondary | ICD-10-CM | POA: Diagnosis not present

## 2021-05-02 DIAGNOSIS — F10131 Alcohol abuse with withdrawal delirium: Secondary | ICD-10-CM | POA: Diagnosis not present

## 2021-05-02 DIAGNOSIS — R0602 Shortness of breath: Secondary | ICD-10-CM | POA: Diagnosis not present

## 2021-05-02 DIAGNOSIS — I312 Hemopericardium, not elsewhere classified: Secondary | ICD-10-CM | POA: Diagnosis present

## 2021-05-02 DIAGNOSIS — F17210 Nicotine dependence, cigarettes, uncomplicated: Secondary | ICD-10-CM | POA: Diagnosis present

## 2021-05-02 DIAGNOSIS — A419 Sepsis, unspecified organism: Secondary | ICD-10-CM | POA: Diagnosis not present

## 2021-05-02 DIAGNOSIS — J9811 Atelectasis: Secondary | ICD-10-CM | POA: Diagnosis not present

## 2021-05-02 DIAGNOSIS — M419 Scoliosis, unspecified: Secondary | ICD-10-CM | POA: Diagnosis present

## 2021-05-02 DIAGNOSIS — I313 Pericardial effusion (noninflammatory): Secondary | ICD-10-CM | POA: Diagnosis not present

## 2021-05-02 DIAGNOSIS — I2510 Atherosclerotic heart disease of native coronary artery without angina pectoris: Secondary | ICD-10-CM | POA: Diagnosis present

## 2021-05-02 DIAGNOSIS — I4891 Unspecified atrial fibrillation: Secondary | ICD-10-CM | POA: Diagnosis not present

## 2021-05-02 DIAGNOSIS — E8770 Fluid overload, unspecified: Secondary | ICD-10-CM | POA: Diagnosis not present

## 2021-05-02 DIAGNOSIS — E871 Hypo-osmolality and hyponatremia: Secondary | ICD-10-CM | POA: Diagnosis present

## 2021-05-02 DIAGNOSIS — Z79899 Other long term (current) drug therapy: Secondary | ICD-10-CM

## 2021-05-02 DIAGNOSIS — I700 Atherosclerosis of aorta: Secondary | ICD-10-CM | POA: Diagnosis not present

## 2021-05-02 DIAGNOSIS — D649 Anemia, unspecified: Secondary | ICD-10-CM | POA: Diagnosis present

## 2021-05-02 DIAGNOSIS — I712 Thoracic aortic aneurysm, without rupture: Secondary | ICD-10-CM | POA: Diagnosis not present

## 2021-05-02 DIAGNOSIS — Z885 Allergy status to narcotic agent status: Secondary | ICD-10-CM

## 2021-05-02 DIAGNOSIS — J90 Pleural effusion, not elsewhere classified: Secondary | ICD-10-CM | POA: Diagnosis present

## 2021-05-02 DIAGNOSIS — J948 Other specified pleural conditions: Secondary | ICD-10-CM | POA: Diagnosis not present

## 2021-05-02 DIAGNOSIS — N179 Acute kidney failure, unspecified: Secondary | ICD-10-CM | POA: Diagnosis present

## 2021-05-02 DIAGNOSIS — F32A Depression, unspecified: Secondary | ICD-10-CM | POA: Diagnosis present

## 2021-05-02 DIAGNOSIS — Z818 Family history of other mental and behavioral disorders: Secondary | ICD-10-CM | POA: Diagnosis not present

## 2021-05-02 DIAGNOSIS — R0789 Other chest pain: Secondary | ICD-10-CM | POA: Diagnosis not present

## 2021-05-02 DIAGNOSIS — Z7982 Long term (current) use of aspirin: Secondary | ICD-10-CM

## 2021-05-02 DIAGNOSIS — Z20822 Contact with and (suspected) exposure to covid-19: Secondary | ICD-10-CM | POA: Diagnosis not present

## 2021-05-02 DIAGNOSIS — G9341 Metabolic encephalopathy: Secondary | ICD-10-CM | POA: Diagnosis not present

## 2021-05-02 DIAGNOSIS — R072 Precordial pain: Secondary | ICD-10-CM | POA: Diagnosis not present

## 2021-05-02 DIAGNOSIS — Z48813 Encounter for surgical aftercare following surgery on the respiratory system: Secondary | ICD-10-CM | POA: Diagnosis not present

## 2021-05-02 DIAGNOSIS — R079 Chest pain, unspecified: Secondary | ICD-10-CM | POA: Diagnosis not present

## 2021-05-02 DIAGNOSIS — I517 Cardiomegaly: Secondary | ICD-10-CM | POA: Diagnosis not present

## 2021-05-02 DIAGNOSIS — Z9889 Other specified postprocedural states: Secondary | ICD-10-CM | POA: Diagnosis not present

## 2021-05-02 DIAGNOSIS — I10 Essential (primary) hypertension: Secondary | ICD-10-CM | POA: Diagnosis present

## 2021-05-02 DIAGNOSIS — Z953 Presence of xenogenic heart valve: Secondary | ICD-10-CM

## 2021-05-02 DIAGNOSIS — Z8249 Family history of ischemic heart disease and other diseases of the circulatory system: Secondary | ICD-10-CM | POA: Diagnosis not present

## 2021-05-02 DIAGNOSIS — R531 Weakness: Secondary | ICD-10-CM | POA: Diagnosis not present

## 2021-05-02 DIAGNOSIS — E875 Hyperkalemia: Secondary | ICD-10-CM | POA: Diagnosis present

## 2021-05-02 DIAGNOSIS — Z888 Allergy status to other drugs, medicaments and biological substances status: Secondary | ICD-10-CM

## 2021-05-02 DIAGNOSIS — I499 Cardiac arrhythmia, unspecified: Secondary | ICD-10-CM | POA: Diagnosis not present

## 2021-05-02 DIAGNOSIS — K5750 Diverticulosis of both small and large intestine without perforation or abscess without bleeding: Secondary | ICD-10-CM | POA: Diagnosis not present

## 2021-05-02 DIAGNOSIS — Z743 Need for continuous supervision: Secondary | ICD-10-CM | POA: Diagnosis not present

## 2021-05-02 DIAGNOSIS — R410 Disorientation, unspecified: Secondary | ICD-10-CM | POA: Diagnosis not present

## 2021-05-02 DIAGNOSIS — F419 Anxiety disorder, unspecified: Secondary | ICD-10-CM | POA: Diagnosis present

## 2021-05-02 LAB — TROPONIN I (HIGH SENSITIVITY)
Troponin I (High Sensitivity): 161 ng/L (ref <18)
Troponin I (High Sensitivity): 162 ng/L (ref <18)
Troponin I (High Sensitivity): 172 ng/L (ref <18)
Troponin I (High Sensitivity): 150 ng/L (ref <18)

## 2021-05-02 LAB — BASIC METABOLIC PANEL
Anion gap: 13 (ref 5–15)
Anion gap: 11 (ref 5–15)
Anion gap: 12 (ref 5–15)
BUN: 31 mg/dL — ABNORMAL HIGH (ref 8–23)
BUN: 30 mg/dL — ABNORMAL HIGH (ref 8–23)
BUN: 29 mg/dL — ABNORMAL HIGH (ref 8–23)
CO2: 26 mmol/L (ref 22–32)
CO2: 27 mmol/L (ref 22–32)
CO2: 26 mmol/L (ref 22–32)
Calcium: 8.7 mg/dL — ABNORMAL LOW (ref 8.9–10.3)
Calcium: 8.6 mg/dL — ABNORMAL LOW (ref 8.9–10.3)
Calcium: 8.6 mg/dL — ABNORMAL LOW (ref 8.9–10.3)
Chloride: 85 mmol/L — ABNORMAL LOW (ref 98–111)
Chloride: 86 mmol/L — ABNORMAL LOW (ref 98–111)
Chloride: 88 mmol/L — ABNORMAL LOW (ref 98–111)
Creatinine, Ser: 1.31 mg/dL — ABNORMAL HIGH (ref 0.44–1.00)
Creatinine, Ser: 1.13 mg/dL — ABNORMAL HIGH (ref 0.44–1.00)
Creatinine, Ser: 1.17 mg/dL — ABNORMAL HIGH (ref 0.44–1.00)
GFR, Estimated: 45 mL/min — ABNORMAL LOW (ref >60)
GFR, Estimated: 54 mL/min — ABNORMAL LOW (ref >60)
GFR, Estimated: 52 mL/min — ABNORMAL LOW (ref >60)
Glucose, Bld: 103 mg/dL — ABNORMAL HIGH (ref 70–99)
Glucose, Bld: 92 mg/dL (ref 70–99)
Glucose, Bld: 99 mg/dL (ref 70–99)
Potassium: 5.2 mmol/L — ABNORMAL HIGH (ref 3.5–5.1)
Potassium: 4.7 mmol/L (ref 3.5–5.1)
Potassium: 4.9 mmol/L (ref 3.5–5.1)
Sodium: 124 mmol/L — ABNORMAL LOW (ref 135–145)
Sodium: 124 mmol/L — ABNORMAL LOW (ref 135–145)
Sodium: 126 mmol/L — ABNORMAL LOW (ref 135–145)

## 2021-05-02 LAB — URINALYSIS, ROUTINE W REFLEX MICROSCOPIC
Bilirubin Urine: NEGATIVE
Glucose, UA: NEGATIVE mg/dL
Ketones, ur: 5 mg/dL — AB
Leukocytes,Ua: NEGATIVE
Nitrite: NEGATIVE
Protein, ur: 100 mg/dL — AB
Specific Gravity, Urine: 1.021 (ref 1.005–1.030)
pH: 5 (ref 5.0–8.0)

## 2021-05-02 LAB — CBC
HCT: 24.4 % — ABNORMAL LOW (ref 36.0–46.0)
HCT: 24.4 % — ABNORMAL LOW (ref 36.0–46.0)
Hemoglobin: 8.1 g/dL — ABNORMAL LOW (ref 12.0–15.0)
Hemoglobin: 8.4 g/dL — ABNORMAL LOW (ref 12.0–15.0)
MCH: 30.6 pg (ref 26.0–34.0)
MCH: 30.8 pg (ref 26.0–34.0)
MCHC: 33.2 g/dL (ref 30.0–36.0)
MCHC: 34.4 g/dL (ref 30.0–36.0)
MCV: 92.1 fL (ref 80.0–100.0)
MCV: 89.4 fL (ref 80.0–100.0)
Platelets: 486 10*3/uL — ABNORMAL HIGH (ref 150–400)
Platelets: 350 10*3/uL (ref 150–400)
RBC: 2.65 MIL/uL — ABNORMAL LOW (ref 3.87–5.11)
RBC: 2.73 MIL/uL — ABNORMAL LOW (ref 3.87–5.11)
RDW: 15.6 % — ABNORMAL HIGH (ref 11.5–15.5)
RDW: 15.5 % (ref 11.5–15.5)
WBC: 32.5 10*3/uL — ABNORMAL HIGH (ref 4.0–10.5)
WBC: 31.4 10*3/uL — ABNORMAL HIGH (ref 4.0–10.5)
nRBC: 1 % — ABNORMAL HIGH (ref 0.0–0.2)
nRBC: 1.3 % — ABNORMAL HIGH (ref 0.0–0.2)

## 2021-05-02 LAB — ECHOCARDIOGRAM COMPLETE
AR max vel: 1.16 cm2
AV Area VTI: 1.39 cm2
AV Area mean vel: 1.18 cm2
AV Mean grad: 12 mmHg
AV Peak grad: 29.2 mmHg
Ao pk vel: 2.7 m/s
Area-P 1/2: 2.6 cm2
Height: 62 in
S' Lateral: 3.52 cm
Weight: 2096 oz

## 2021-05-02 LAB — CBC WITH DIFFERENTIAL/PLATELET
Abs Immature Granulocytes: 0.59 10*3/uL — ABNORMAL HIGH (ref 0.00–0.07)
Basophils Absolute: 0.1 10*3/uL (ref 0.0–0.1)
Basophils Relative: 0 %
Eosinophils Absolute: 0 10*3/uL (ref 0.0–0.5)
Eosinophils Relative: 0 %
HCT: 26.7 % — ABNORMAL LOW (ref 36.0–46.0)
Hemoglobin: 8.7 g/dL — ABNORMAL LOW (ref 12.0–15.0)
Immature Granulocytes: 2 %
Lymphocytes Relative: 4 %
Lymphs Abs: 1.2 10*3/uL (ref 0.7–4.0)
MCH: 30.2 pg (ref 26.0–34.0)
MCHC: 32.6 g/dL (ref 30.0–36.0)
MCV: 92.7 fL (ref 80.0–100.0)
Monocytes Absolute: 1.4 10*3/uL — ABNORMAL HIGH (ref 0.1–1.0)
Monocytes Relative: 4 %
Neutro Abs: 29.5 10*3/uL — ABNORMAL HIGH (ref 1.7–7.7)
Neutrophils Relative %: 90 %
Platelets: 527 10*3/uL — ABNORMAL HIGH (ref 150–400)
RBC: 2.88 MIL/uL — ABNORMAL LOW (ref 3.87–5.11)
RDW: 15.6 % — ABNORMAL HIGH (ref 11.5–15.5)
WBC: 32.9 10*3/uL — ABNORMAL HIGH (ref 4.0–10.5)
nRBC: 0.8 % — ABNORMAL HIGH (ref 0.0–0.2)

## 2021-05-02 LAB — PROTIME-INR
INR: 1.6 — ABNORMAL HIGH (ref 0.8–1.2)
Prothrombin Time: 19.2 seconds — ABNORMAL HIGH (ref 11.4–15.2)

## 2021-05-02 LAB — GLUCOSE, CAPILLARY
Glucose-Capillary: 105 mg/dL — ABNORMAL HIGH (ref 70–99)
Glucose-Capillary: 106 mg/dL — ABNORMAL HIGH (ref 70–99)
Glucose-Capillary: 91 mg/dL (ref 70–99)
Glucose-Capillary: 92 mg/dL (ref 70–99)

## 2021-05-02 LAB — LACTIC ACID, PLASMA
Lactic Acid, Venous: 2.7 mmol/L (ref 0.5–1.9)
Lactic Acid, Venous: 2.4 mmol/L (ref 0.5–1.9)
Lactic Acid, Venous: 2.2 mmol/L (ref 0.5–1.9)
Lactic Acid, Venous: 2.6 mmol/L (ref 0.5–1.9)
Lactic Acid, Venous: 2.8 mmol/L (ref 0.5–1.9)

## 2021-05-02 LAB — PROCALCITONIN: Procalcitonin: 0.67 ng/mL

## 2021-05-02 LAB — TYPE AND SCREEN
ABO/RH(D): O POS
Antibody Screen: NEGATIVE

## 2021-05-02 LAB — BRAIN NATRIURETIC PEPTIDE: B Natriuretic Peptide: 823.2 pg/mL — ABNORMAL HIGH (ref 0.0–100.0)

## 2021-05-02 LAB — HIV ANTIBODY (ROUTINE TESTING W REFLEX): HIV Screen 4th Generation wRfx: NONREACTIVE

## 2021-05-02 LAB — RESP PANEL BY RT-PCR (FLU A&B, COVID) ARPGX2
Influenza A by PCR: NEGATIVE
Influenza B by PCR: NEGATIVE
SARS Coronavirus 2 by RT PCR: NEGATIVE

## 2021-05-02 LAB — APTT: aPTT: 31 seconds (ref 24–36)

## 2021-05-02 LAB — MAGNESIUM: Magnesium: 2.5 mg/dL — ABNORMAL HIGH (ref 1.7–2.4)

## 2021-05-02 MED ORDER — ONDANSETRON HCL 4 MG/2ML IJ SOLN
4.0000 mg | Freq: Four times a day (QID) | INTRAMUSCULAR | Status: DC | PRN
  Administered 2021-05-02: 4 mg via INTRAVENOUS
  Filled 2021-05-02: qty 2

## 2021-05-02 MED ORDER — METOPROLOL TARTRATE 25 MG PO TABS
50.0000 mg | ORAL_TABLET | Freq: Two times a day (BID) | ORAL | Status: DC
  Administered 2021-05-02: 50 mg via ORAL
  Filled 2021-05-02: qty 2

## 2021-05-02 MED ORDER — IOHEXOL 350 MG/ML SOLN
50.0000 mL | Freq: Once | INTRAVENOUS | Status: AC | PRN
  Administered 2021-05-02: 50 mL via INTRAVENOUS

## 2021-05-02 MED ORDER — ATROPINE SULFATE 1 MG/10ML IJ SOSY
PREFILLED_SYRINGE | INTRAMUSCULAR | Status: AC
  Filled 2021-05-02: qty 10

## 2021-05-02 MED ORDER — LORAZEPAM 2 MG/ML IJ SOLN
0.2500 mg | Freq: Four times a day (QID) | INTRAMUSCULAR | Status: DC | PRN
  Administered 2021-05-03: 0.25 mg via INTRAVENOUS
  Filled 2021-05-02 (×2): qty 1

## 2021-05-02 MED ORDER — SODIUM CHLORIDE 0.9 % IV SOLN
1.0000 g | Freq: Two times a day (BID) | INTRAVENOUS | Status: DC
  Administered 2021-05-02: 1 g via INTRAVENOUS
  Filled 2021-05-02 (×4): qty 1

## 2021-05-02 MED ORDER — SODIUM CHLORIDE 0.9 % IV SOLN
INTRAVENOUS | Status: DC | PRN
  Administered 2021-05-02 – 2021-05-03 (×2): 250 mL via INTRAVENOUS

## 2021-05-02 MED ORDER — VANCOMYCIN HCL 1000 MG/200ML IV SOLN
1000.0000 mg | Freq: Once | INTRAVENOUS | Status: AC
  Administered 2021-05-02: 1000 mg via INTRAVENOUS
  Filled 2021-05-02: qty 200

## 2021-05-02 MED ORDER — CLEVIDIPINE BUTYRATE 0.5 MG/ML IV EMUL
0.0000 mg/h | INTRAVENOUS | Status: DC
  Administered 2021-05-02: 1 mg/h via INTRAVENOUS
  Administered 2021-05-02: 5 mg/h via INTRAVENOUS
  Administered 2021-05-02: 3 mg/h via INTRAVENOUS
  Administered 2021-05-03 (×2): 5 mg/h via INTRAVENOUS
  Filled 2021-05-02 (×6): qty 50

## 2021-05-02 MED ORDER — WHITE PETROLATUM EX OINT
TOPICAL_OINTMENT | CUTANEOUS | Status: AC
  Administered 2021-05-03: 0.2
  Filled 2021-05-02: qty 28.35

## 2021-05-02 MED ORDER — LORAZEPAM 2 MG/ML IJ SOLN
0.2500 mg | Freq: Once | INTRAMUSCULAR | Status: AC
  Administered 2021-05-02: 0.25 mg via INTRAVENOUS
  Filled 2021-05-02: qty 1

## 2021-05-02 MED ORDER — LACTATED RINGERS IV BOLUS
1000.0000 mL | Freq: Once | INTRAVENOUS | Status: AC
  Administered 2021-05-02: 1000 mL via INTRAVENOUS

## 2021-05-02 MED ORDER — IPRATROPIUM-ALBUTEROL 0.5-2.5 (3) MG/3ML IN SOLN
3.0000 mL | RESPIRATORY_TRACT | Status: DC | PRN
  Administered 2021-05-02: 3 mL via RESPIRATORY_TRACT
  Filled 2021-05-02: qty 3

## 2021-05-02 MED ORDER — ONDANSETRON HCL 4 MG/2ML IJ SOLN
4.0000 mg | Freq: Once | INTRAMUSCULAR | Status: AC
  Administered 2021-05-02: 4 mg via INTRAVENOUS
  Filled 2021-05-02 (×2): qty 2

## 2021-05-02 MED ORDER — HYDROMORPHONE HCL 1 MG/ML IJ SOLN
0.5000 mg | Freq: Once | INTRAMUSCULAR | Status: AC
Start: 2021-05-02 — End: 2021-05-02
  Administered 2021-05-02: 0.5 mg via INTRAVENOUS
  Filled 2021-05-02: qty 1

## 2021-05-02 MED ORDER — LABETALOL HCL 5 MG/ML IV SOLN
10.0000 mg | INTRAVENOUS | Status: DC | PRN
  Administered 2021-05-02: 10 mg via INTRAVENOUS
  Filled 2021-05-02: qty 4

## 2021-05-02 MED ORDER — ORAL CARE MOUTH RINSE
15.0000 mL | Freq: Two times a day (BID) | OROMUCOSAL | Status: DC
  Administered 2021-05-02 – 2021-05-07 (×8): 15 mL via OROMUCOSAL

## 2021-05-02 MED ORDER — VANCOMYCIN HCL 750 MG/150ML IV SOLN
750.0000 mg | INTRAVENOUS | Status: DC
  Filled 2021-05-02: qty 150

## 2021-05-02 MED ORDER — SODIUM CHLORIDE 0.9 % IV SOLN
2.0000 g | Freq: Once | INTRAVENOUS | Status: AC
  Administered 2021-05-02: 2 g via INTRAVENOUS
  Filled 2021-05-02: qty 2

## 2021-05-02 MED ORDER — LABETALOL HCL 5 MG/ML IV SOLN: 10.0000 mg | INTRAVENOUS | Status: DC | PRN

## 2021-05-02 MED ORDER — VANCOMYCIN HCL 10 G IV SOLR: 20.0000 mg/kg | Freq: Once | INTRAVENOUS | Status: DC

## 2021-05-02 MED ORDER — CHLORHEXIDINE GLUCONATE CLOTH 2 % EX PADS
6.0000 | MEDICATED_PAD | Freq: Every day | CUTANEOUS | Status: DC
  Administered 2021-05-03 – 2021-05-07 (×6): 6 via TOPICAL

## 2021-05-02 MED ORDER — POLYETHYLENE GLYCOL 3350 17 G PO PACK: 17.0000 g | PACK | Freq: Every day | ORAL | Status: DC | PRN

## 2021-05-02 MED ORDER — FUROSEMIDE 10 MG/ML IJ SOLN
40.0000 mg | Freq: Once | INTRAMUSCULAR | Status: DC
  Filled 2021-05-02: qty 4

## 2021-05-02 MED ORDER — DOCUSATE SODIUM 100 MG PO CAPS: 100.0000 mg | ORAL_CAPSULE | Freq: Two times a day (BID) | ORAL | Status: DC | PRN

## 2021-05-02 NOTE — Consult Note (Signed)
Cardiology Consultation:   Patient ID: Deborah Koch MRN: [id number]; DOB: 1956/10/27  Admit date: 05/02/2021 Date of Consult: 05/02/2021  PCP:  Burnard Hawthorne, [place] [facility] Cardiologist:  Ida Rogue, MD        Patient Profile:   Deborah Koch is a 65 y.o. female with a hx of bicuspid aortic stenosis s/p recent bioprosthetic AVR/aortic root replacement who  being seen 05/02/2021 for the evaluation of acute dyspnea at the request of Dr Johnney Killian.  History of Present Illness:   Ms. Beveridge underwent elective bioprosthetic aortic valve replacement, ascending aorta and arch replacement with Hemashield graft, and aorto innominate/aorto left carotid bypass 04/21/2021.  Her postoperative course was essentially unremarkable with the exception of hypertension and she was started back on her home antihypertensive regimen.  He otherwise progressed well was discharged home 04/27/2021.  The patient reports that she initially did well through the middle of last week.  However, over the last 4 days she has developed progressive weakness in her legs, exertional dyspnea, and generalized weakness/malaise.  She denies fevers or chills, but states that her sister told her that her periphery felt very cold even though she felt like she was sweating.  She had some lightheadedness but no frank syncope.  She has also had some chest pressure.  The patient ultimately presented to the emergency department this morning for further evaluation.  Findings are notable for acute kidney injury with creatinine increased from 0.78 on 04/25/21 to 1.31 today.  Potassium is mildly elevated at 5.2.  BNP is elevated at 823 and high-sensitivity troponin is minimally elevated at 161.  Lactate is also elevated at 2.7.  Patient's hemoglobin is stable at 8.7 but her white blood cell count is now 33,000.  This is increased from her white blood cell count of 13,000 on 04/24/2021.  At the time of my evaluation with  the patient in the medical ICU, she only complains of ongoing moderate dyspnea, currently on oxygen per nasal cannula.    Past Medical History:  Diagnosis Date  . Anxiety   . C. difficile colitis    a. 07/2014.  [name] Congenital bicuspid aortic valve   . Depression   . Dilated Ascending Aorta    a. 12/2013 Echo: 4.45cm.  . Diverticulitis    hospitalized; subsequent c diff infection.   [name] GERD (gastroesophageal reflux disease)   . H/O mastitis 2015  . Heart murmur   . History of tobacco abuse    a. 20 yrs, 1/4 ppd - quit.  [name] HTN (hypertension)   . Hypokalemia   . Moderate to severe aortic stenosis    a. 12/2013 Echo: EF 60-65%, Grade 1 DD, bicuspid AoV with mod-sev AS [79mmHg mean gradient, AoV area 0.69cm^2 (VTI)], mod dil ascending Ao - 4.45cm, mild TR, PASP 56mmHg.  . Multilevel degenerative disc disease   . Pneumonia    at age 50  . Scoliosis     Past Surgical History:  Procedure Laterality Date  . AORTA -INNOMIATE BYPASS N/A 04/21/2021   Procedure: AORTA -Titus Mould BYPASS;  Surgeon: Gaye Pollack, MD;  Location: [facility] OR;  Service: Vascular;  Laterality: N/A;  . AORTIC VALVE REPLACEMENT N/A 04/21/2021   Procedure: AORTIC VALVE REPLACEMENT (AVR) ON PUMP WITH 23MM INSPIRIS AORTIC VALVE;  Surgeon: Gaye Pollack, MD;  Location: [facility];  Service: Open Heart Surgery;  Laterality: N/A;  . BARTHOLIN GLAND CYST EXCISION    . BREAST BIOPSY Left    neg  .  CAROTID-SUBCLAVIAN BYPASS GRAFT Left 04/21/2021   Procedure: AORTA LEFT COMMON CAROTID BYPASS;  Surgeon: Gaye Pollack, MD;  Location: [facility] OR;  Service: Vascular;  Laterality: Left;  . COLONOSCOPY  2013   Dr. Allen Norris  . COLONOSCOPY WITH PROPOFOL N/A 10/23/2017   Procedure: COLONOSCOPY WITH PROPOFOL;  Surgeon: Lucilla Lame, MD;  Location: [facility] END[facility]COPY;  Service: Endoscopy;  Laterality: N/A;  . OOPHORECTOMY  2016  . OVARY SURGERY  2016   cyst on right ovary  . REPLACEMENT ASCENDING AORTA N/A 04/21/2021   Procedure: REPLACEMENT ASCENDING & ARCH  AORTA WITH HEMASHIELD PLATINUM GRAFT 28 [id number];  Surgeon: Gaye Pollack, MD;  Location: [facility];  Service: Open Heart Surgery;  Laterality: N/A;  CIRC ARREST  RIGHT AXILLARY ARTERY CANNULATION  . RIGHT/LEFT HEART CATH AND CORONARY ANGIOGRAPHY N/A 03/24/2021   Procedure: RIGHT/LEFT HEART CATH AND CORONARY ANGIOGRAPHY;  Surgeon: Minna Merritts, MD;  Location: [facility] CV LAB;  Service: Cardiovascular;  Laterality: N/A;  . SALPINGECTOMY Bilateral   . TEE WITHOUT CARDIOVERSION N/A 04/21/2021   Procedure: TRANSESOPHAGEAL ECHOCARDIOGRAM (TEE);  Surgeon: Gaye Pollack, MD;  Location: [place] [facility];  Service: Open Heart Surgery;  Laterality: N/A;     Home Medications:  Prior to Admission medications   Medication Sig Start Date End Date Taking? Authorizing Provider  aspirin EC 325 MG EC tablet Take 1 tablet (325 mg total) by mouth daily. 04/27/21  Yes Roddenberry, Myron G, PA-C  clonazePAM (KLONOPIN) 0.5 MG tablet TAKE 1 TABLET BY MOUTH TWICE A DAY AS NEEDED Patient taking differently: Take 0.5 mg by mouth 2 (two) times daily. 03/11/21  Yes Burnard Hawthorne, FNP  escitalopram (LEXAPRO) 10 MG tablet Take 1 tablet (10 mg total) by mouth daily. Patient taking differently: Take 10 mg by mouth in the morning. 11/22/20  Yes Arnett, [name], FNP  lisinopril (ZESTRIL) 20 MG tablet Take 1 tablet (20 mg total) by mouth daily. Patient taking differently: Take 20 mg by mouth in the morning. 11/22/20  Yes Burnard Hawthorne, FNP  Metoprolol Tartrate 75 MG TABS Take 75 mg by mouth 2 (two) times daily. 04/27/21  Yes Roddenberry, Arlis Porta, PA-C  rosuvastatin (CRESTOR) 5 MG tablet Take 1 tablet (5 mg total) by mouth daily. Patient taking differently: Take 5 mg by mouth in the morning. 03/22/21 06/20/21 Yes Visser, Jacquelyn D, PA-C  traMADol (ULTRAM) 50 MG tablet Take 1 tablet (50 mg total) by mouth every 4 (four) hours as needed for up to 5 days for moderate pain. 04/27/21 05/02/21 Yes Antony Odea, PA-C     Inpatient Medications: Scheduled Meds:  Continuous Infusions: . ceFEPime (MAXIPIME) IV    . clevidipine 2 mg/hr (05/02/21 1134)   PRN Meds: labetalol, ondansetron (ZOFRAN) IV  Allergies:    Allergies  Allergen Reactions  . Iodine Rash  . Vicodin [Hydrocodone-Acetaminophen] Hives and Rash    Social History:   Social History   Socioeconomic History  . Marital status: Married    Spouse name: Not on file  . Number of children: Not on file  . Years of education: Not on file  . Highest education level: Not on file  Occupational History  . Not on file  Tobacco Use  . Smoking status: Light Tobacco Smoker    Packs/day: 0.25    Years: 20.00    Pack years: 5.00    Types: Cigarettes  . Smokeless tobacco: Never Used  Vaping Use  . Vaping Use: Never used  Substance and Sexual  Activity  . Alcohol use: Yes    Alcohol/week: 12.0 standard drinks    Types: 10 Glasses of wine, 2 Cans of beer per week    Comment:  2-3 glasses of wine every other evening  . Drug use: No  . Sexual activity: Yes    Birth control/protection: None  Other Topics Concern  . Not on file  Social History Narrative   Moved to [place] 07/2020   Separated from second husband.       Sister is patient of mine      Divorced first husband ( husband had drug addiction)       2 children who live in [place]      Not working since she got C.diff infection 2015 while working at [employer].      Started yoga   Social Determinants of Health   Financial Resource Strain: Not on file  Food Insecurity: Not on file  Transportation Needs: Not on file  Physical Activity: Not on file  Stress: Not on file  Social Connections: Not on file  Intimate Partner Violence: Not on file    Family History:    Family History  Problem Relation Age of Onset  . Hypertension Mother   . Colon cancer Mother   . Anxiety disorder Mother   . Depression Mother   . Heart failure Father   . Hypertension Father   .  Depression Sister   . Anxiety disorder Sister   . Alcohol abuse Brother   . Schizophrenia Brother   . Breast cancer Maternal Aunt   . Heart attack Neg Hx   . Stroke Neg Hx      ROS:  Please see the history of present illness.  All other ROS reviewed and negative.     Physical Exam/Data:   Vitals:   05/02/21 1154 05/02/21 1214 05/02/21 1215 05/02/21 1300  BP: (!) 159/69  (!) 151/81 130/68  Pulse: 67  73   Resp: 17  14 15   Temp:  98.1 F (36.7 C)    TempSrc:  Oral    SpO2:   95%   Weight:      Height:        Intake/Output Summary (Last 24 hours) at 05/02/2021 1401 Last data filed at 05/02/2021 1112 Gross per 24 hour  Intake 100 ml  Output --  Net 100 ml   Last 3 Weights 05/02/2021 04/27/2021 04/26/2021  Weight (lbs) 131 lb 138 lb 4.8 oz 143 lb 8.3 oz  Weight (kg) 59.421 kg 62.732 kg 65.1 kg  Some encounter information is confidential and restricted. Go to Review Flowsheets activity to see all data.     Body mass index is 23.96 kg/m.  General:  Well nourished, well developed, in no acute distress HEENT: normal Lymph: no adenopathy Neck: no JVD Endocrine:  No thryomegaly Vascular: No carotid bruits; FA pulses 2+ bilaterally, pedal pulses 2+= Cardiac:  normal S1, S2; RRR; no murmur  Lungs:  clear to auscultation bilaterally, no wheezing, rhonchi or rales  Abd: soft, nontender, no hepatomegaly  Ext: no edema Musculoskeletal:  No deformities, BUE and BLE strength normal and equal Skin: warm and dry  Neuro:  CNs 2-12 intact, no focal abnormalities noted Psych:  Normal affect   EKG:  The EKG was personally reviewed and demonstrates:  NSR 60 bpm, possible age-indeterminate anterior MI, no acute ST-T changes  Telemetry:  Telemetry was personally reviewed and demonstrates:  NSR without significant arrhythmia  Relevant CV Studies: 2D echo  pending  Laboratory Data:  High Sensitivity Troponin:   Recent Labs  Lab 05/02/21 0757 05/02/21 1033  TROPONINIHS 161* 162*      Chemistry Recent Labs  Lab 05/02/21 0757  NA 124*  K 5.2*  CL 85*  CO2 26  GLUCOSE 103*  BUN 31*  CREATININE 1.31*  CALCIUM 8.7*  GFRNONAA 45*  ANIONGAP 13    No results for input(s): PROT, ALBUMIN, AST, ALT, ALKPHOS, BILITOT in the last 168 hours. Hematology Recent Labs  Lab 05/02/21 0757  WBC 32.9*  RBC 2.88*  HGB 8.7*  HCT 26.7*  MCV 92.7  MCH 30.2  MCHC 32.6  RDW 15.6*  PLT 527*   BNP Recent Labs  Lab 05/02/21 0757  BNP 823.2*    DDimer No results for input(s): DDIMER in the last 168 hours.   Radiology/Studies:  DG Chest 2 View  Result Date: 05/02/2021 CLINICAL DATA:  Sob chest pain post op 1 week ago heart surgery EXAM: CHEST - 2 VIEW COMPARISON:  04/24/2021 FINDINGS: Persistent small pleural effusions and bibasilar atelectasis/consolidation left greater than right. Heart size upper limits normal. Previous AVR. Aortic Atherosclerosis (ICD10-170.0). Ectatic thoracic aorta as previously documented. No pneumothorax. Moderate thoracolumbar dextroscoliosis without evident underlying vertebral anomaly. Surgical clips in the epigastrium. IMPRESSION: Persistent small pleural effusions and bibasilar consolidation/atelectasis. Electronically Signed   By: Lucrezia Europe M.D.   On: 05/02/2021 08:03   CT Head Wo Contrast  Result Date: 05/02/2021 CLINICAL DATA:  Mental status changes. EXAM: CT HEAD WITHOUT CONTRAST TECHNIQUE: Contiguous axial images were obtained from the base of the skull through the vertex without intravenous contrast. COMPARISON:  08/19/2017 FINDINGS: Brain: No acute intracranial abnormality. Specifically, no hemorrhage, hydrocephalus, mass lesion, acute infarction, or significant intracranial injury. Vascular: No hyperdense vessel or unexpected calcification. Skull: No acute calvarial abnormality. Sinuses/Orbits: No acute findings Other: None IMPRESSION: No acute intracranial abnormality. Electronically Signed   By: Rolm Baptise M.D.   On: 05/02/2021 09:42    CT Chest Wo Contrast  Result Date: 05/02/2021 CLINICAL DATA:  Chest pain. EXAM: CT CHEST WITHOUT CONTRAST TECHNIQUE: Multidetector CT imaging of the chest was performed following the standard protocol without IV contrast. COMPARISON:  April 11, 2021. FINDINGS: Cardiovascular: Status post aortic valve repair and surgical repair of ascending thoracic aorta. Moderate size pericardial effusion is noted with some high density material suggesting some degree of hemopericardium. Atherosclerosis of thoracic aorta is noted. Coronary artery calcifications are noted. Mediastinum/Nodes: No enlarged mediastinal or axillary lymph nodes. Thyroid gland, trachea, and esophagus demonstrate no significant findings. Lungs/Pleura: No pneumothorax is noted. Small bilateral pleural effusions are noted with adjacent atelectasis of both lower lobes. Probable loculated pleural effusions are noted involving the left major fissure. Upper Abdomen: No acute abnormality. Musculoskeletal: Moderate to severe dextroscoliosis of the thoracic and lumbar spine is noted. No acute osseous abnormality is noted. IMPRESSION: Status post aortic valve repair and surgical pair of ascending thoracic aorta. Moderate size pericardial effusion is noted with some high density material present suggesting some degree of hemopericardium. CT a of the chest is recommended to rule out aortic extravasation or injury. Critical Value/emergent results were called by telephone at the time of interpretation on 05/02/2021 at 10:24 am to provider [facility] , who verbally acknowledged these results. Small bilateral pleural effusions are noted with adjacent atelectasis of both lower lobes. Probable loculated pleural effusions are seen involving the left major fissure. Aortic Atherosclerosis (ICD10-I70.0). Electronically Signed   By: Bobbe Medico.D.  On: 05/02/2021 10:24   CT Angio Chest/Abd/Pel for Dissection W and/or W/WO  Result Date: 05/02/2021 CLINICAL  DATA:  Chest pain after aortic valve repair. EXAM: CT ANGIOGRAPHY CHEST, ABDOMEN AND PELVIS TECHNIQUE: Non-contrast CT of the chest was initially obtained. Multidetector CT imaging through the chest, abdomen and pelvis was performed using the standard protocol during bolus administration of intravenous contrast. Multiplanar reconstructed images and MIPs were obtained and reviewed to evaluate the vascular anatomy. CONTRAST:  65mL OMNIPAQUE IOHEXOL 350 MG/ML SOLN COMPARISON:  Same day.  April 11, 2021. FINDINGS: CTA CHEST FINDINGS Cardiovascular: Status post aortic valve repair and surgical repair of ascending thoracic aortic aneurysm. There is noted moderate pericardial fluid with high density within it at an average Hounsfield measurement of 43 along the left cardiac border, concerning for hemopericardium. Coronary artery calcifications are noted. There is no definite dissection is seen involving the thoracic aorta. Atherosclerosis is noted involving the transverse aortic arch and descending thoracic aorta. There is noted an outpouching of contrast measuring 10 x 9 mm arising from the transverse aortic arch inferiorly into the left with adjacent calcification or other high-density material, best seen on image number 77 of series 9. This may represent a surgical access point or graft outpouching, but small contained pseudoaneurysm cannot be excluded. No definite active extravasation into the pericardium is noted. Mediastinum/Nodes: No enlarged mediastinal, hilar, or axillary lymph nodes. Thyroid gland, trachea, and esophagus demonstrate no significant findings. Lungs/Pleura: No pneumothorax is noted. Small to moderate size right pleural effusion is noted with associated atelectasis of the right upper and lower lobes. Small loculated effusions are seen involving the superior and lower aspects of the left major fissure. Mild left basilar subsegmental atelectasis is noted. Musculoskeletal: No chest wall abnormality. No  acute or significant osseous findings. Review of the MIP images confirms the above findings. CTA ABDOMEN AND PELVIS FINDINGS VASCULAR Aorta: Atherosclerosis of thoracic aorta is noted without aneurysm or dissection. Celiac: Patent without evidence of aneurysm, dissection, vasculitis or significant stenosis. SMA: Patent without evidence of aneurysm, dissection, vasculitis or significant stenosis. Renals: Both renal arteries are patent without evidence of aneurysm, dissection, vasculitis, fibromuscular dysplasia or significant stenosis. IMA: Patent without evidence of aneurysm, dissection, vasculitis or significant stenosis. Inflow: Patent without evidence of aneurysm, dissection, vasculitis or significant stenosis. Veins: No obvious venous abnormality within the limitations of this arterial phase study. Review of the MIP images confirms the above findings. NON-VASCULAR Hepatobiliary: No focal liver abnormality is seen. No gallstones, gallbladder wall thickening, or biliary dilatation. Pancreas: Unremarkable. No pancreatic ductal dilatation or surrounding inflammatory changes. Spleen: There is interval development of a moderate amount of high density material around the spleen with an average Hounsfield measurement of 56, concerning for possible perisplenic hematoma. Adrenals/Urinary Tract: Adrenal glands are unremarkable. Kidneys are normal, without renal calculi, focal lesion, or hydronephrosis. Bladder is unremarkable. Stomach/Bowel: Stomach is within normal limits. Appendix appears normal. No evidence of bowel wall thickening, distention, or inflammatory changes. Sigmoid diverticulosis is noted without inflammation. Lymphatic: No significant adenopathy is noted. Reproductive: Uterus and bilateral adnexa are unremarkable. Other: No hernia is noted. Musculoskeletal: No acute or significant osseous findings. Review of the MIP images confirms the above findings. IMPRESSION: Status post recent aortic valve replacement  and surgical repair of ascending thoracic aortic aneurysm. Moderate size pericardial effusion is noted which contains high density material and is concerning for some degree of hemopericardium as noted on previous exam of same day. There does not appear to be any evidence of  thoracic aortic dissection. 10 x 9 mm outpouching of contrast is seen arising from the transverse aortic arch inferiorly and to the left which may represent a surgical access point or portion of the aortic graft, but focal pseudoaneurysm cannot be excluded. No definite active extravasation into other portions of the pericardium is noted. Correlation with the intraoperative details of the patient's recent surgery is recommended. Also noted is moderate sized complex material around the spleen concerning for possible perisplenic hematoma of unknown etiology. Small to moderate size right pleural effusion is noted with adjacent atelectasis of the right upper and lower lobes. Small loculated effusions are seen involving the left major fissure with mild atelectasis in the left lower lobe. Sigmoid diverticulosis without inflammation. Aortic Atherosclerosis (ICD10-I70.0). Electronically Signed   By: Marijo Conception M.D.   On: 05/02/2021 13:02     Assessment and Plan:   1. SIRS possible sepsis: Patient with marked leukocytosis, progressive weakness and dyspnea, and elevated lactate.  Started on broad-spectrum antibiotics and blood cultures sent per CCM team.  Procalcitonin pending. 2. Pericardial effusion, possibly hemorrhagic: Reviewed CT images demonstrating moderate perfusion, mostly located posteriorly in the periaortic region.  Based on CT does not appear like there is much fluid volume anterior or inferior to the heart where a subxiphoid approach with possible.  Await 2D echocardiogram. 3. Acute respiratory failure with hypoxemia: Unclear etiology, possible that pleural/pericardial effusions are contributing.  Patient also with hypertensive  heart disease now with blood pressure managed on IV Cleviprex and well-controlled. CXR does not show pulmonary edema. Review echo when done. With mild AKI would hold on diuresis at present.  4. Anemia - stable. No evidence of active bleed.   Will follow with you.    Risk Assessment/Risk Scores:                For questions or updates, please contact [facility] Please consult [email or web address] for contact info under    Signed, Sherren Mocha, MD  05/02/2021 2:01 PM

## 2021-05-02 NOTE — Progress Notes (Signed)
Pharmacy Antibiotic Note  Deborah Koch is a 65 y.o. female with recent thoracic aortic aneurysm repair and AVR who presents 5/16 with CP, concern for sepsis. Pharmacy has been consulted for Cefepime dosing - Vancomycin x 1 dose given in the ED.  The patient is noted to be in AKI with baseline SCr 0.6-0.7 and admit SCr 1.31, estimated CrCl</= 30 ml/min.   Plan: - Cefepime 2g IV x 1 followed by 1g IV every 12 hours - Will continue to follow renal function, culture results, LOT, and antibiotic de-escalation plans   Height: 5\' 2"  (157.5 cm) Weight: 59.4 kg (131 lb) IBW/kg (Calculated) : 50.1  Temp (24hrs), Avg:97.6 F (36.4 C), Min:97.6 F (36.4 C), Max:97.6 F (36.4 C)  Recent Labs  Lab 05/02/21 0757 05/02/21 0807  WBC 32.9*  --   CREATININE 1.31*  --   LATICACIDVEN  --  2.7*    Estimated Creatinine Clearance: 33.9 mL/min (A) (by C-G formula based on SCr of 1.31 mg/dL (H)).    Allergies  Allergen Reactions  . Iodine Rash  . Vicodin [Hydrocodone-Acetaminophen] Hives and Rash    Antimicrobials this admission: Vancomycin 5/16 x 1 Cefepime 5/16 >>  Microbiology results: 5/6 BCx >>  Thank you for allowing pharmacy to be a part of this patient's care.  Alycia Rossetti, PharmD, BCPS Clinical Pharmacist Clinical phone for 05/02/2021: [phone] 05/02/2021 10:31 AM   **Pharmacist phone directory can now be found on [email or web address] ([email or web address]).  Listed under [place].

## 2021-05-02 NOTE — Progress Notes (Addendum)
Follow up CBC with Hb 8.1, slowly down-trending. LA 2.6, slowly uptrending. Will give 1L LR Repeat CBC, LA at 8pm.   Possibly sources of bleeding- spleen, hemorhagic pericardial effusion. Pleural effusions seem less likely and are probably related to previous surgery. IR consulted for R thora given recent chest tubes and chest instrumentation.  Julian Hy, DO 05/02/21 5:55 PM [facility]   New- onset Afib on tele with rate 110-120. EKG ordered for confirmation. Starting metoprolol 50 BID; remains on cleviprex with SBP ~140. Getting 1L LR now. Has repeat labs this evening.  Julian Hy, DO 05/02/21 6:29 PM [facility]

## 2021-05-02 NOTE — Progress Notes (Signed)
Pharmacy Antibiotic Note  Deborah Koch is a 65 y.o. female with recent thoracic aortic aneurysm repair and AVR who presents 5/16 with CP, concern for sepsis. Pharmacy was consulted for Cefepime dosing - Vancomycin x 1 dose given in the ED earlier today. Now consulted to continue IV Vancomycin as well.   Last dose of Vancomycin 1000 mg was given at 11:19 AM on 05/02/21. The patient is noted to be in AKI with baseline SCr 0.6-0.7 and admit SCr 1.31.  SCr is improving with hydration at 1.17 (estimated CrCl ~37 mL/min).  Cultures are pending.   Plan: - Restart Vancomycin 750 mg IV every 24 hours. Goal AUC 400-550. Expected AUC: 489 SCr used: 1.17 - Continue Cefepime 1g IV every 12 hours - Will continue to follow renal function, culture results, LOT, and antibiotic de-escalation plans   Height: 5\' 2"  (157.5 cm) Weight: 59.4 kg (131 lb) IBW/kg (Calculated) : 50.1  Temp (24hrs), Avg:97.5 F (36.4 C), Min:96.9 F (36.1 C), Max:98.1 F (36.7 C)  Recent Labs  Lab 05/02/21 0757 05/02/21 0807 05/02/21 1033 05/02/21 1253 05/02/21 1347 05/02/21 1539  WBC 32.9*  --   --   --   --  32.5*  CREATININE 1.31*  --   --  1.13*  --  1.17*  LATICACIDVEN  --  2.7* 2.4*  --  2.2* 2.6*    Estimated Creatinine Clearance: 37.9 mL/min (A) (by C-G formula based on SCr of 1.17 mg/dL (H)).    Allergies  Allergen Reactions  . Iodine Rash  . Vicodin [Hydrocodone-Acetaminophen] Hives and Rash    Antimicrobials this admission: Vancomycin 5/16 x 1 Cefepime 5/16 >>  Microbiology results: 5/16 BCx >> 5/16 Resp panel >>  Thank you for allowing pharmacy to be a part of this patient's care.  Sloan Leiter, PharmD, BCPS, BCCCP Clinical Pharmacist Please refer to [facility] for [place] numbers 05/02/2021 5:58 PM   **Pharmacist phone directory can now be found on [email or web address] ([email or web address]).  Listed under [place].

## 2021-05-02 NOTE — Consult Note (Addendum)
Deborah Koch       Deborah Koch,Deborah Koch [id number]             [id number]        Cathlene Oakley [facility] Medical Record [id number] Date of Birth: October 31, 1956  Referring: Charlesetta Shanks, MD  (ED Physician) Primary Care: Burnard Hawthorne, FNP Primary Cardiologist:Timothy Rockey Situ, MD  Chief Complaint:    Chief Complaint  Patient presents with  . Chest Pain    Pt arrived via [facility]. Per EMS Bicuspid value replacement 5/5. Since pt has had Nausea, and shOB, left sided Chest pain, weakness. Denies vomiting, diarrhea.   88%RA, 2L 96%. A-Fib, 90-120HR, 166/110    . Shortness of Breath    Pt arrived via [facility]. Per EMS Bicuspid value replacement 5/5. Since pt has had Nausea, and shOB, left sided Chest pain, weakness. Denies vomiting, diarrhea.   88%RA, 2L 96%. A-Fib, 90-120HR, 166/110      History of Present Illness:    Ms. Langs is a pleasant 65 year old female known to our service with a past history significant for congenital bicuspid aortic valve with development of subsequent severe aortic stenosis and poststenotic aortic dilatation.  She also has a past history significant for hypertension, anxiety with depression, and gastroesophageal reflux disease.  She is status post  aortic valve replacement with a 23 mm Edwards bovine pericardial tissue valve along with replacement of the ascending aorta with aorto innominate and aorto carotid bypass utilizing a 28 x 10 x 8 x 8 x10 Hemashield graft by Dr. Cyndia Bent on 04/21/2021.  Her postoperative course was uneventful except for some postoperative hypertension that was managed appropriately.  She was discharged home on the sixth postoperative day.    She presented to the emergency department earlier today with primary complaint of chest pain associated with shortness of breath.  She also complained of nausea without vomiting or abdominal pain.  She reported having a dizzy spell associated with nausea yesterday after  which she fell to the floor.  She denies losing consciousness.  She does say that she landed "flat on her chest".   In the ED, she was found to have a systolic blood pressure of 180 mmHg.  Significant laboratory findings in the emergency room included white blood count of 32,900 hematocrit 26.7% (this is stable since discharge), sodium 124, potassium 5.2, creatinine 1.3 ( this is was 0.78 at the time of discharge 04/27/2021).  CXR showed small but stable pleural effusions with bibasilar consolidation/atelectasis.  A noncontrast CT scan of the chest showed a moderate sized pericardial effusion with density suggestive of hemopericardium.  Small bilateral pleural effusions were also confirmed on the CT scan.  A follow-on CT angiogram has been obtained and shows no evidence of aortic dissection.  The report mentions a 10 x 9 mm outpouching of contrast arising from the transverse arch inferiorly.  I suspect this is a ligated sidebranch of the Hemashield graft.  An echocardiogram is also pending at the time of this dictation noncontrast CT scan of the head was unrevealing.  Currently, Ms. Veltman is in the [facility] ICU.  She denies any pain currently after receiving a dose of Dilaudid in the ED.  She also denies shortness of breath.  She tells me she has not had very good appetite at home but she has been eating and having appropriate bowel movements.  She denies dysuria, fever, or chills.     Past Medical History:  Diagnosis Date  .  Anxiety   . C. difficile colitis    a. 07/2014.  [name] Congenital bicuspid aortic valve   . Depression   . Dilated Ascending Aorta    a. 12/2013 Echo: 4.45cm.  . Diverticulitis    hospitalized; subsequent c diff infection.   [name] GERD (gastroesophageal reflux disease)   . H/O mastitis 2015  . Heart murmur   . History of tobacco abuse    a. 20 yrs, 1/4 ppd - quit.  [name] HTN (hypertension)   . Hypokalemia   . Moderate to severe aortic stenosis    a. 12/2013 Echo: EF 60-65%, Grade 1 DD,  bicuspid AoV with mod-sev AS [63mmHg mean gradient, AoV area 0.69cm^2 (VTI)], mod dil ascending Ao - 4.45cm, mild TR, PASP 36mmHg.  . Multilevel degenerative disc disease   . Pneumonia    at age 77  . Scoliosis     Past Surgical History:  Procedure Laterality Date  . AORTA -INNOMIATE BYPASS N/A 04/21/2021   Procedure: AORTA -Titus Mould BYPASS;  Surgeon: Gaye Pollack, MD;  Location: [facility] OR;  Service: Vascular;  Laterality: N/A;  . AORTIC VALVE REPLACEMENT N/A 04/21/2021   Procedure: AORTIC VALVE REPLACEMENT (AVR) ON PUMP WITH 23MM INSPIRIS AORTIC VALVE;  Surgeon: Gaye Pollack, MD;  Location: [place];  Service: Open Heart Surgery;  Laterality: N/A;  . BARTHOLIN GLAND CYST EXCISION    . BREAST BIOPSY Left    neg  . CAROTID-SUBCLAVIAN BYPASS GRAFT Left 04/21/2021   Procedure: AORTA LEFT COMMON CAROTID BYPASS;  Surgeon: Gaye Pollack, MD;  Location: [place] OR;  Service: Vascular;  Laterality: Left;  . COLONOSCOPY  2013   Dr. Allen Norris  . COLONOSCOPY WITH PROPOFOL N/A 10/23/2017   Procedure: COLONOSCOPY WITH PROPOFOL;  Surgeon: Lucilla Lame, MD;  Location: [facility] ENDOSCOPY;  Service: Endoscopy;  Laterality: N/A;  . OOPHORECTOMY  2016  . OVARY SURGERY  2016   cyst on right ovary  . REPLACEMENT ASCENDING AORTA N/A 04/21/2021   Procedure: REPLACEMENT ASCENDING & ARCH AORTA WITH HEMASHIELD PLATINUM GRAFT 28 [id number];  Surgeon: Gaye Pollack, MD;  Location: [place];  Service: Open Heart Surgery;  Laterality: N/A;  CIRC ARREST  RIGHT AXILLARY ARTERY CANNULATION  . RIGHT/LEFT HEART CATH AND CORONARY ANGIOGRAPHY N/A 03/24/2021   Procedure: RIGHT/LEFT HEART CATH AND CORONARY ANGIOGRAPHY;  Surgeon: Minna Merritts, MD;  Location: [place] [facility];  Service: Cardiovascular;  Laterality: N/A;  . SALPINGECTOMY Bilateral   . TEE WITHOUT CARDIOVERSION N/A 04/21/2021   Procedure: TRANSESOPHAGEAL ECHOCARDIOGRAM (TEE);  Surgeon: Gaye Pollack, MD;  Location: [place];  Service: Open Heart Surgery;  Laterality: N/A;     Social History   Tobacco Use  Smoking Status Light Tobacco Smoker  . Packs/day: 0.25  . Years: 20.00  . Pack years: 5.00  . Types: Cigarettes  Smokeless Tobacco Never Used    Social History   Substance and Sexual Activity  Alcohol Use Yes  . Alcohol/week: 12.0 standard drinks  . Types: 10 Glasses of wine, 2 Cans of beer per week   Comment:  2-3 glasses of wine every other evening     Allergies  Allergen Reactions  . Iodine Rash  . Vicodin [Hydrocodone-Acetaminophen] Hives and Rash    Current Facility-Administered Medications  Medication Dose Route Frequency Provider Last Rate Last Admin  . ceFEPIme (MAXIPIME) 1 g in sodium chloride 0.9 % 100 mL IVPB  1 g Intravenous Q12H Rolla Flatten, [facility]      . clevidipine (CLEVIPREX) infusion  0.5 mg/mL  0-21 mg/hr Intravenous Continuous Cristal Generous, NP 4 mL/hr at 05/02/21 1134 2 mg/hr at 05/02/21 1134  . labetalol (NORMODYNE) injection 10 mg  10 mg Intravenous Q10 min PRN Bowser, Laurel Dimmer, NP      . ondansetron (ZOFRAN) injection 4 mg  4 mg Intravenous Q6H PRN Bowser, Laurel Dimmer, NP       Facility-Administered Medications Ordered in Other Encounters  Medication Dose Route Frequency Provider Last Rate Last Admin  . sodium chloride flush (NS) 0.9 % injection 3 mL  3 mL Intravenous Q12H Visser, Jacquelyn D, PA-C        Medications Prior to Admission  Medication Sig Dispense Refill Last Dose  . aspirin EC 325 MG EC tablet Take 1 tablet (325 mg total) by mouth daily. 30 tablet 0 05/01/2021 at Unknown time  . clonazePAM (KLONOPIN) 0.5 MG tablet TAKE 1 TABLET BY MOUTH TWICE A DAY AS NEEDED (Patient taking differently: Take 0.5 mg by mouth 2 (two) times daily.) 60 tablet 1 05/02/2021 at Unknown time  . escitalopram (LEXAPRO) 10 MG tablet Take 1 tablet (10 mg total) by mouth daily. (Patient taking differently: Take 10 mg by mouth in the morning.) 90 tablet 1 05/01/2021 at Unknown time  . lisinopril (ZESTRIL) 20 MG tablet Take 1 tablet  (20 mg total) by mouth daily. (Patient taking differently: Take 20 mg by mouth in the morning.) 90 tablet 1 05/01/2021 at Unknown time  . Metoprolol Tartrate 75 MG TABS Take 75 mg by mouth 2 (two) times daily. 60 tablet 1 05/01/2021 at 2000  . rosuvastatin (CRESTOR) 5 MG tablet Take 1 tablet (5 mg total) by mouth daily. (Patient taking differently: Take 5 mg by mouth in the morning.) 30 tablet 5 05/01/2021 at Unknown time  . traMADol (ULTRAM) 50 MG tablet Take 1 tablet (50 mg total) by mouth every 4 (four) hours as needed for up to 5 days for moderate pain. 20 tablet 0 04/30/2021    Family History  Problem Relation Age of Onset  . Hypertension Mother   . Colon cancer Mother   . Anxiety disorder Mother   . Depression Mother   . Heart failure Father   . Hypertension Father   . Depression Sister   . Anxiety disorder Sister   . Alcohol abuse Brother   . Schizophrenia Brother   . Breast cancer Maternal Aunt   . Heart attack Neg Hx   . Stroke Neg Hx      Review of Systems:   ROS    Cardiac Review of Systems: Y or  [    ]= no  Chest Pain [ x   ]  Resting SOB [  x ] Exertional SOB  [ x ]  Orthopnea [  ]   Pedal Edema [   ]    Palpitations [  ] Syncope  [  ]   Presyncope [   ]  General Review of Systems: [Y] = yes [  ]=no Constitional: recent weight change [  ]; anorexia [  ]; fatigue [x  ]; nausea [ x ]; night sweats [  ]; fever [  ]; or chills [  ]  Eye : blurred vision [  ]; diplopia [   ]; vision changes [  ];  Amaurosis fugax[  ]; Resp: cough [  ];  wheezing[  ];  hemoptysis[  ]; shortness of breath[ x ]; paroxysmal nocturnal dyspnea[  ]; dyspnea on exertion[ x ]; or orthopnea[  ];  GI:  gallstones[  ], vomiting[  ];  dysphagia[  ]; melena[  ];  hematochezia [  ]; heartburn[  ];   Hx of  Colonoscopy[  ]; GU: kidney stones [  ]; hematuria[  ];   dysuria [  ];  nocturia[  ];  history of     obstruction [  ]; urinary frequency [   ]             Skin: rash, swelling[  ];, hair loss[  ];  peripheral edema[  ];  or itching[  ]; Musculosketetal: myalgias[  ];  joint swelling[  ];  joint erythema[  ];  joint pain[  ];  back pain[  ];  Heme/Lymph: bruising[  ];  bleeding[  ];  anemia[  ];  Neuro: TIA[  ];  headaches[  ];  stroke[  ];  vertigo[  ];  seizures[  ];   paresthesias[  ];  difficulty walking[  ];  Psych:depression[x  ]; anxiety[ x ];  Endocrine: diabetes[  ];  thyroid dysfunction[  ];             Physical Exam: BP (!) 159/69   Pulse 67   Temp 98.1 F (36.7 C) (Oral)   Resp 17   Ht 5\' 2"  (1.575 m)   Wt 59.4 kg   SpO2 94%   BMI 23.96 kg/m    General appearance: alert, cooperative and mild distress Head: Normocephalic, without obvious abnormality, atraumatic Neck: no adenopathy, no carotid bruit, no JVD and supple, symmetrical, trachea midline Lymph nodes: Cervical, supraclavicular, and axillary nodes normal. Resp: Few scattered wheezes, breathing is not labored. Back: She has significant dextroscoliosis Cardio: regular rate and rhythm, S1, S2 normal, no murmur, click, rub or gallop GI: Abdomen is soft and nontender.  There are active bowel sounds Extremities: Distal fingers and toes are cool and cyanotic.  She has bilateral palpable radial pulses and easily palpable femoral pulses.  I am able to palpate the right posterior tibial pulse but no pulses were palpable on the left foot. Neurologic: Grossly normal  Diagnostic Studies & Laboratory data:     Recent Radiology Findings:   DG Chest 2 View  Result Date: 05/02/2021 CLINICAL DATA:  Sob chest pain post op 1 week ago heart surgery EXAM: CHEST - 2 VIEW COMPARISON:  04/24/2021 FINDINGS: Persistent small pleural effusions and bibasilar atelectasis/consolidation left greater than right. Heart size upper limits normal. Previous AVR. Aortic Atherosclerosis (ICD10-170.0). Ectatic thoracic aorta as previously documented. No pneumothorax. Moderate  thoracolumbar dextroscoliosis without evident underlying vertebral anomaly. Surgical clips in the epigastrium. IMPRESSION: Persistent small pleural effusions and bibasilar consolidation/atelectasis. Electronically Signed   By: Lucrezia Europe M.D.   On: 05/02/2021 08:03   CT Head Wo Contrast  Result Date: 05/02/2021 CLINICAL DATA:  Mental status changes. EXAM: CT HEAD WITHOUT CONTRAST TECHNIQUE: Contiguous axial images were obtained from the base of the skull through the vertex without intravenous contrast. COMPARISON:  08/19/2017 FINDINGS: Brain: No acute intracranial abnormality. Specifically, no hemorrhage, hydrocephalus, mass lesion, acute infarction, or significant intracranial injury. Vascular: No hyperdense vessel or unexpected calcification. Skull: No acute calvarial abnormality. Sinuses/Orbits: No acute findings Other: None  IMPRESSION: No acute intracranial abnormality. Electronically Signed   By: Rolm Baptise M.D.   On: 05/02/2021 09:42   CT Chest Wo Contrast  Result Date: 05/02/2021 CLINICAL DATA:  Chest pain. EXAM: CT CHEST WITHOUT CONTRAST TECHNIQUE: Multidetector CT imaging of the chest was performed following the standard protocol without IV contrast. COMPARISON:  April 11, 2021. FINDINGS: Cardiovascular: Status post aortic valve repair and surgical repair of ascending thoracic aorta. Moderate size pericardial effusion is noted with some high density material suggesting some degree of hemopericardium. Atherosclerosis of thoracic aorta is noted. Coronary artery calcifications are noted. Mediastinum/Nodes: No enlarged mediastinal or axillary lymph nodes. Thyroid gland, trachea, and esophagus demonstrate no significant findings. Lungs/Pleura: No pneumothorax is noted. Small bilateral pleural effusions are noted with adjacent atelectasis of both lower lobes. Probable loculated pleural effusions are noted involving the left major fissure. Upper Abdomen: No acute abnormality. Musculoskeletal: Moderate to  severe dextroscoliosis of the thoracic and lumbar spine is noted. No acute osseous abnormality is noted. IMPRESSION: Status post aortic valve repair and surgical pair of ascending thoracic aorta. Moderate size pericardial effusion is noted with some high density material present suggesting some degree of hemopericardium. CT a of the chest is recommended to rule out aortic extravasation or injury. Critical Value/emergent results were called by telephone at the time of interpretation on 05/02/2021 at 10:24 am to provider [facility] , who verbally acknowledged these results. Small bilateral pleural effusions are noted with adjacent atelectasis of both lower lobes. Probable loculated pleural effusions are seen involving the left major fissure. Aortic Atherosclerosis (ICD10-I70.0). Electronically Signed   By: Marijo Conception M.D.   On: 05/02/2021 10:24   CT Angio Chest/Abd/Pel for Dissection W and/or W/WO  Result Date: 05/02/2021 CLINICAL DATA:  Chest pain after aortic valve repair. EXAM: CT ANGIOGRAPHY CHEST, ABDOMEN AND PELVIS TECHNIQUE: Non-contrast CT of the chest was initially obtained. Multidetector CT imaging through the chest, abdomen and pelvis was performed using the standard protocol during bolus administration of intravenous contrast. Multiplanar reconstructed images and MIPs were obtained and reviewed to evaluate the vascular anatomy. CONTRAST:  32mL OMNIPAQUE IOHEXOL 350 MG/ML SOLN COMPARISON:  Same day.  April 11, 2021. FINDINGS: CTA CHEST FINDINGS Cardiovascular: Status post aortic valve repair and surgical repair of ascending thoracic aortic aneurysm. There is noted moderate pericardial fluid with high density within it at an average Hounsfield measurement of 43 along the left cardiac border, concerning for hemopericardium. Coronary artery calcifications are noted. There is no definite dissection is seen involving the thoracic aorta. Atherosclerosis is noted involving the transverse aortic arch  and descending thoracic aorta. There is noted an outpouching of contrast measuring 10 x 9 mm arising from the transverse aortic arch inferiorly into the left with adjacent calcification or other high-density material, best seen on image number 77 of series 9. This may represent a surgical access point or graft outpouching, but small contained pseudoaneurysm cannot be excluded. No definite active extravasation into the pericardium is noted. Mediastinum/Nodes: No enlarged mediastinal, hilar, or axillary lymph nodes. Thyroid gland, trachea, and esophagus demonstrate no significant findings. Lungs/Pleura: No pneumothorax is noted. Small to moderate size right pleural effusion is noted with associated atelectasis of the right upper and lower lobes. Small loculated effusions are seen involving the superior and lower aspects of the left major fissure. Mild left basilar subsegmental atelectasis is noted. Musculoskeletal: No chest wall abnormality. No acute or significant osseous findings. Review of the MIP images confirms the above findings. CTA ABDOMEN AND  PELVIS FINDINGS VASCULAR Aorta: Atherosclerosis of thoracic aorta is noted without aneurysm or dissection. Celiac: Patent without evidence of aneurysm, dissection, vasculitis or significant stenosis. SMA: Patent without evidence of aneurysm, dissection, vasculitis or significant stenosis. Renals: Both renal arteries are patent without evidence of aneurysm, dissection, vasculitis, fibromuscular dysplasia or significant stenosis. IMA: Patent without evidence of aneurysm, dissection, vasculitis or significant stenosis. Inflow: Patent without evidence of aneurysm, dissection, vasculitis or significant stenosis. Veins: No obvious venous abnormality within the limitations of this arterial phase study. Review of the MIP images confirms the above findings. NON-VASCULAR Hepatobiliary: No focal liver abnormality is seen. No gallstones, gallbladder wall thickening, or biliary  dilatation. Pancreas: Unremarkable. No pancreatic ductal dilatation or surrounding inflammatory changes. Spleen: There is interval development of a moderate amount of high density material around the spleen with an average Hounsfield measurement of 56, concerning for possible perisplenic hematoma. Adrenals/Urinary Tract: Adrenal glands are unremarkable. Kidneys are normal, without renal calculi, focal lesion, or hydronephrosis. Bladder is unremarkable. Stomach/Bowel: Stomach is within normal limits. Appendix appears normal. No evidence of bowel wall thickening, distention, or inflammatory changes. Sigmoid diverticulosis is noted without inflammation. Lymphatic: No significant adenopathy is noted. Reproductive: Uterus and bilateral adnexa are unremarkable. Other: No hernia is noted. Musculoskeletal: No acute or significant osseous findings. Review of the MIP images confirms the above findings. IMPRESSION: Status post recent aortic valve replacement and surgical repair of ascending thoracic aortic aneurysm. Moderate size pericardial effusion is noted which contains high density material and is concerning for some degree of hemopericardium as noted on previous exam of same day. There does not appear to be any evidence of thoracic aortic dissection. 10 x 9 mm outpouching of contrast is seen arising from the transverse aortic arch inferiorly and to the left which may represent a surgical access point or portion of the aortic graft, but focal pseudoaneurysm cannot be excluded. No definite active extravasation into other portions of the pericardium is noted. Correlation with the intraoperative details of the patient's recent surgery is recommended. Also noted is moderate sized complex material around the spleen concerning for possible perisplenic hematoma of unknown etiology. Small to moderate size right pleural effusion is noted with adjacent atelectasis of the right upper and lower lobes. Small loculated effusions are  seen involving the left major fissure with mild atelectasis in the left lower lobe. Sigmoid diverticulosis without inflammation. Aortic Atherosclerosis (ICD10-I70.0). Electronically Signed   By: Marijo Conception M.D.   On: 05/02/2021 13:02     I have independently reviewed the above radiologic studies and discussed with the patient   Recent Lab Findings: Lab Results  Component Value Date   WBC 32.9 (H) 05/02/2021   HGB 8.7 (L) 05/02/2021   HCT 26.7 (L) 05/02/2021   PLT 527 (H) 05/02/2021   GLUCOSE 103 (H) 05/02/2021   CHOL 187 02/08/2021   TRIG 57.0 02/08/2021   HDL 93.50 02/08/2021   LDLCALC 82 02/08/2021   ALT 13 04/21/2021   AST 34 04/21/2021   NA 124 (L) 05/02/2021   K 5.2 (H) 05/02/2021   CL 85 (L) 05/02/2021   CREATININE 1.31 (H) 05/02/2021   BUN 31 (H) 05/02/2021   CO2 26 05/02/2021   TSH 2.15 05/05/2020   INR 1.5 (H) 04/21/2021   HGBA1C 5.2 04/19/2021      Assessment / Plan:    Pleasant 65 year old female who is 10 days status post aortic valve replacement and repair of ascending aortic aneurysm with a supra coronary Hemashield graft now  presents with complaint of chest pain, fatigue, intermittent nausea, and shortness of breath.  She apparently had a fall in her home yesterday landing on her chest incision when she tried to stand up and walk.  She has a significant leukocytosis and appears dehydrated based on admission laboratory data.  CT angiography of the chest does not show any evidence of dissection.  She does have a mild pericardium but this is not unexpected following the procedure she had 10 days ago.  Need to review the echocardiogram to assure there is no tamponade physiology.  She has been admitted to the critical care service.  IV fluids have been initiated along with broad-spectrum antibiotics.  Blood cultures are pending.  Dr. Caffie Pinto is aware of her admission and will review laboratory and clinical data.  We will follow as the work-up proceeds     I  spent  30 minutes counseling the patient face to face.   Antony Odea, PA-C  05/02/2021 1:09 PM    Chart reviewed, patient examined, agree with above. This patient is well-known to me following recent aortic valve replacement with a pericardial valve and replacement of an ascending aortic and arch aneurysm with bypasses to the innominate and left common carotid artery she had an uncomplicated postoperative course and was discharged home on 04/27/2021 feeling well.  She said that she did well for several days but beginning late last week she started having progressive shortness of breath, fatigue, chest discomfort and intermittent nausea.  She said that she was gasping for air this morning and told her sister to bring her to the emergency room.  Her white blood cell count was noted to be 32.9, up from 13.3 prior to discharge.  Her hemoglobin was 8.7 which was stable from 8.9 prior to discharge.  Her troponin was 161 and she had no coronary disease or prior catheterization.  BNP was 823.  Lactic acid was 2.7.  She was hyponatremic to 124 with a BUN of 31 and creatinine of 1.3.  CTA of the chest was done and I reviewed that.  There is a moderate pericardial effusion with some high density material suggesting hemopericardium which is not unexpected after this type of surgery.  There is no evidence of aortic dissection.  There was a 10 mm outpouching of contrast from the distal ascending aortic graft which is the ligated side graft that we use for cardiopulmonary bypass.  This was double suture ligated and divided.  There is no sign of extravasation and no pseudoaneurysm formation.  The coronary arteries filled with contrast.  There was a moderate size right pleural effusion with right lower lobe atelectasis.  There is small amount of loculated left pleural fluid.  There is also moderate size complex collection around the spleen concerning for possible perisplenic hematoma and I wonder if that could have happened  when she fell today.  She has had a 2D echo this afternoon which has not been officially read.  The effusion appears predominantly posterior with no significant effusion over the RV.  I do not see any signs of tamponade.  The aortic valve appears to be functioning normally.  LV and RV systolic function appear normal.  Her laboratory test suggest the possibility of sepsis of undetermined etiology.  She has been cultured and started on broad-spectrum antibiotics.  Her hypoxemia may be related to the right pleural effusion with right lower lobe collapse.  She may benefit from right thoracentesis.  I would watch her pericardial effusion  at this time since she is hemodynamically stable with hypertension on admission and has normal heart rate in the 70s.  This effusion is located mostly posteriorly and may not be straightforward to drain surgically.  Despite her fall this morning her sternum appears intact on CT scan and feels stable on exam.  Her chest incision is healing well.  We will continue following her progress closely.

## 2021-05-02 NOTE — ED Provider Notes (Addendum)
I saw and evaluated the patient, reviewed the resident's note and I agree with the findings and plan.  EKG Interpretation  Date/Time:  Monday May 02 2021 07:07:54 EDT Ventricular Rate:  60 PR Interval:  179 QRS Duration: 80 QT Interval:  443 QTC Calculation: 443 R Axis:   19 Text Interpretation: Sinus rhythm Anterior infarct, old no acute ischemic appearance. 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  Patient was discharged [phone].  She reports initially post discharge she was feeling fairly well but over the past several days has significantly declined.  She reports she has been extremely weak and very uncomfortable.  She has been getting progressively short of breath.  She has suffered from nausea.  As of last night, she reports she was too weak to actually get up and walk to the bathroom.  She tried to go to the bathroom by herself during the night but became very weak and had to sit down on the floor and get assistance.  Family members note that she is appeared very short of breath.  She feels nauseated.  She denies significant pain, but admits that she just feels generally really poorly.  Family has noted a mild degree of confusion as well.  They have not documented fever.  Patient sister reports that she was extremely pale this morning and they finally determined to come to the emergency department.  On exam, patient is uncomfortable and ill in appearance.  Her mental status is clear.  She is speaking in full sentences.  She is pale and mildly dyspneic at rest.  She has slightly shifting and does not appear to be able to get a comfortable position.  Airway is patent.  Anterior chest wall incision healing well.  No significant erythema.  Patient still has several sutures in the abdominal wall.  Very minimal surrounding erythema without any  drainage discharge or appearance of cellulitis.  Heart regular.  I cannot appreciate any significant murmur.  Breath sounds are soft at the bases.  No gross rhonchi or rale.  Abdomen is soft without guarding.  Mildly tender upper abdomen.  No significant peripheral edema.  Calves are soft and pliable.  Feet do not have any swelling or wounds.  Lower legs and feet feel cool to the touch.  Femoral pulses intact.  Pedal pulses are not palpable manually.  Resident physician Dr. Miki Kins obtained pedal pulses with hand-held Doppler.  Neurologically patient is intact with good spontaneous movement of upper extremities and lower extremities.  Patient is clinically ill in appearance with concern for postoperative complications from thoracic ascending aorta and aortic valve repairs.  Broad diagnostic evaluation initiated.  Resident physician Dr. Miki Kins placed consult to cardiothoracic surgery just after initial evaluation to update on patient's clinical appearance and presentation to the emergency department.  Chest x-ray showed pleural effusions and a new leukocytosis to 34,000.  Started on broad-spectrum antibiotics.  For patient discomfort, given half a milligram Dilaudid and Zofran which significantly improved patient's nausea and general appearance of restlessness and dyspnea.  She reports feeling much improved.  Patient continued to be tachypneic but maintaining her own airway without difficulty on supplemental oxygen 4 L at the high 90s.  No sign of impending respiratory failure.  Noncontrast CT obtained first due to AKI.  Radiology noted pericardial effusion with some concern for possible extravasation on this noncontrasted study.  This  was reviewed with Dr. Roxy Manns.  He recommends proceeding with full CT dissection study with contrast and initiating IV pressure control.  CTA dissection ordered, Cleviprex ordered, Echo cardiogram ordered.  Consult placed to critical care team for admission.  Patient and  family member at bedside updated and aware of all plans.  CRITICAL CARE Performed by: Charlesetta Shanks   Total critical care time: 60 minutes  Critical care time was exclusive of separately billable procedures and treating other patients.  Critical care was necessary to treat or prevent imminent or life-threatening deterioration.  Critical care was time spent personally by me on the following activities: development of treatment plan with patient and/or surrogate as well as nursing, discussions with consultants, evaluation of patient's response to treatment, examination of patient, obtaining history from patient or surrogate, ordering and performing treatments and interventions, ordering and review of laboratory studies, ordering and review of radiographic studies, pulse oximetry and re-evaluation of patient's condition.   Charlesetta Shanks, MD 05/02/21 [place]    Charlesetta Shanks, MD 05/02/21 1157

## 2021-05-02 NOTE — ED Provider Notes (Signed)
[facility] EMERGENCY DEPARTMENT Provider Note   CSN: [id number] Arrival date & time: 05/02/21  0654     History Chief Complaint  Patient presents with  . Chest Pain    Pt arrived via [facility]. Per EMS Bicuspid value replacement 5/5. Since pt has had Nausea, and shOB, left sided Chest pain, weakness. Denies vomiting, diarrhea.   88%RA, 2L 96%. A-Fib, 90-120HR, 166/110    . Shortness of Breath    Pt arrived via [facility]. Per EMS Bicuspid value replacement 5/5. Since pt has had Nausea, and shOB, left sided Chest pain, weakness. Denies vomiting, diarrhea.   88%RA, 2L 96%. A-Fib, 90-120HR, 166/110      Deborah Koch is a 65 y.o. female.  HPI 65 year old female with history of hypertension, severe aortic stenosis s/p valve replacement on 04/21/2021, and GERD presents emergency department for chest pain, shortness of breath, leg weakness, and nausea.  She states this started on Friday after her surgery, but have been constantly worsening.  Nothing makes them better, nothing makes them worse.  States she has not had symptoms like this previously.  States when she stands up, she feels like her legs are getting give out and she gets very short of breath.  Has not been able to walk around her house for the last couple of days.  Has intermittent nausea.  Zofran has helped with the nausea.  She states the symptoms are now severe.  Does states she spoke with Dr. Laurene Footman office to let them know she was coming to the hospital.  Sister was worried because she was not making a lot of sense this morning, which has not happened before.    Past Medical History:  Diagnosis Date  . Anxiety   . C. difficile colitis    a. 07/2014.  [name] Congenital bicuspid aortic valve   . Depression   . Dilated Ascending Aorta    a. 12/2013 Echo: 4.45cm.  . Diverticulitis    hospitalized; subsequent c diff infection.   [name] GERD (gastroesophageal reflux disease)   . H/O mastitis 2015  .  Heart murmur   . History of tobacco abuse    a. 20 yrs, 1/4 ppd - quit.  [name] HTN (hypertension)   . Hypokalemia   . Moderate to severe aortic stenosis    a. 12/2013 Echo: EF 60-65%, Grade 1 DD, bicuspid AoV with mod-sev AS [67mmHg mean gradient, AoV area 0.69cm^2 (VTI)], mod dil ascending Ao - 4.45cm, mild TR, PASP 20mmHg.  . Multilevel degenerative disc disease   . Pneumonia    at age 25  . Scoliosis     Patient Active Problem List   Diagnosis Date Noted  . Sepsis due to undetermined organism ([place]) 05/02/2021  . Hypertensive emergency 05/02/2021  . S/P ascending aortic aneurysm repair 04/21/2021  . HLD (hyperlipidemia) 09/15/2020  . Fatigue 01/28/2020  . Atherosclerosis of aorta ([place]) 06/27/2019  . Bronchitis 01/08/2019  . Personal history of colonic polyps   . Benign neoplasm of ascending colon   . LLQ abdominal pain 12/26/2016  . Routine physical examination 10/09/2016  . History of Clostridium difficile infection 12/21/2015  . HTN (hypertension) 06/08/2015  . Severe aortic valve stenosis   . Congenital bicuspid aortic valve   . Ascending aorta dilation ([facility])   . Anxiety and depression 12/19/2013  . GERD (gastroesophageal reflux disease) 12/19/2013    Past Surgical History:  Procedure Laterality Date  . AORTA -INNOMIATE BYPASS N/A 04/21/2021   Procedure: AORTA -  INNOMIATE BYPASS;  Surgeon: Gaye Pollack, MD;  Location[facility] OR;  Service: Vascular;  Laterality: N/A;  . AORTIC VALVE REPLACEMENT N/A 04/21/2021   Procedure: AORTIC VALVE REPLACEMENT (AVR) ON PUMP WITH 23MM INSPIRIS AORTIC VALVE;  Surgeon: Gaye Pollack, MD;  Location: [facility];  Service: Open Heart Surgery;  Laterality: N/A;  . BARTHOLIN GLAND CYST EXCISION    . BREAST BIOPSY Left    neg  . CAROTID-SUBCLAVIAN BYPASS GRAFT Left 04/21/2021   Procedure: AORTA LEFT COMMON CAROTID BYPASS;  Surgeon: Gaye Pollack, MD;  Location: [facility] OR;  Service: Vascular;  Laterality: Left;  . COLONOSCOPY  2013   Dr. Allen Norris  . COLONOSCOPY  WITH PROPOFOL N/A 10/23/2017   Procedure: COLONOSCOPY WITH PROPOFOL;  Surgeon: Lucilla Lame, MD;  Location: [facility] ENDOSCOPY;  Service: Endoscopy;  Laterality: N/A;  . OOPHORECTOMY  2016  . OVARY SURGERY  2016   cyst on right ovary  . REPLACEMENT ASCENDING AORTA N/A 04/21/2021   Procedure: REPLACEMENT ASCENDING & ARCH AORTA WITH HEMASHIELD PLATINUM GRAFT 28 [id number];  Surgeon: Gaye Pollack, MD;  Location: [facility];  Service: Open Heart Surgery;  Laterality: N/A;  CIRC ARREST  RIGHT AXILLARY ARTERY CANNULATION  . RIGHT/LEFT HEART CATH AND CORONARY ANGIOGRAPHY N/A 03/24/2021   Procedure: RIGHT/LEFT HEART CATH AND CORONARY ANGIOGRAPHY;  Surgeon: Minna Merritts, MD;  Location: [facility] CV LAB;  Service: Cardiovascular;  Laterality: N/A;  . SALPINGECTOMY Bilateral   . TEE WITHOUT CARDIOVERSION N/A 04/21/2021   Procedure: TRANSESOPHAGEAL ECHOCARDIOGRAM (TEE);  Surgeon: Gaye Pollack, MD;  Location: [facility];  Service: Open Heart Surgery;  Laterality: N/A;     OB History    Gravida  2   Para  2   Term  2   Preterm      AB      Living  2     SAB      IAB      Ectopic      Multiple      Live Births  2           Family History  Problem Relation Age of Onset  . Hypertension Mother   . Colon cancer Mother   . Anxiety disorder Mother   . Depression Mother   . Heart failure Father   . Hypertension Father   . Depression Sister   . Anxiety disorder Sister   . Alcohol abuse Brother   . Schizophrenia Brother   . Breast cancer Maternal Aunt   . Heart attack Neg Hx   . Stroke Neg Hx     Social History   Tobacco Use  . Smoking status: Light Tobacco Smoker    Packs/day: 0.25    Years: 20.00    Pack years: 5.00    Types: Cigarettes  . Smokeless tobacco: Never Used  Vaping Use  . Vaping Use: Never used  Substance Use Topics  . Alcohol use: Yes    Alcohol/week: 12.0 standard drinks    Types: 10 Glasses of wine, 2 Cans of beer per week    Comment:  2-3 glasses of  wine every other evening  . Drug use: No    Home Medications Prior to Admission medications   Medication Sig Start Date End Date Taking? Authorizing Provider  aspirin EC 325 MG EC tablet Take 1 tablet (325 mg total) by mouth daily. 04/27/21  Yes Roddenberry, Myron G, PA-C  clonazePAM (KLONOPIN) 0.5 MG tablet TAKE 1 TABLET BY MOUTH TWICE A  DAY AS NEEDED Patient taking differently: Take 0.5 mg by mouth 2 (two) times daily. 03/11/21  Yes Burnard Hawthorne, FNP  escitalopram (LEXAPRO) 10 MG tablet Take 1 tablet (10 mg total) by mouth daily. Patient taking differently: Take 10 mg by mouth in the morning. 11/22/20  Yes Arnett, [name], FNP  lisinopril (ZESTRIL) 20 MG tablet Take 1 tablet (20 mg total) by mouth daily. Patient taking differently: Take 20 mg by mouth in the morning. 11/22/20  Yes Burnard Hawthorne, FNP  Metoprolol Tartrate 75 MG TABS Take 75 mg by mouth 2 (two) times daily. 04/27/21  Yes Roddenberry, Arlis Porta, PA-C  rosuvastatin (CRESTOR) 5 MG tablet Take 1 tablet (5 mg total) by mouth daily. Patient taking differently: Take 5 mg by mouth in the morning. 03/22/21 06/20/21 Yes Visser, Jacquelyn D, PA-C  traMADol (ULTRAM) 50 MG tablet Take 1 tablet (50 mg total) by mouth every 4 (four) hours as needed for up to 5 days for moderate pain. 04/27/21 05/02/21 Yes Roddenberry, Arlis Porta, PA-C    Allergies    Iodine and Vicodin [hydrocodone-acetaminophen]  Review of Systems   Review of Systems  Constitutional: Negative for chills and fever.  HENT: Negative for ear pain and sore throat.   Eyes: Negative for pain and visual disturbance.  Respiratory: Negative for cough and shortness of breath.   Cardiovascular: Positive for chest pain. Negative for palpitations.  Gastrointestinal: Negative for abdominal pain and vomiting.  Genitourinary: Negative for dysuria and hematuria.  Musculoskeletal: Negative for arthralgias and back pain.  Skin: Negative for color change and rash.  Neurological:  Negative for seizures and syncope.  All other systems reviewed and are negative.   Physical Exam Updated Vital Signs BP (!) 159/69   Pulse 67   Temp 98.1 F (36.7 C) (Oral)   Resp 17   Ht 5\' 2"  (1.575 m)   Wt 59.4 kg   SpO2 94%   BMI 23.96 kg/m   Physical Exam Vitals and nursing note reviewed.  Constitutional:      Appearance: She is ill-appearing.  HENT:     Head: Normocephalic and atraumatic.  Eyes:     Extraocular Movements: Extraocular movements intact.     Conjunctiva/sclera: Conjunctivae normal.  Cardiovascular:     Rate and Rhythm: Normal rate and regular rhythm.     Comments: Pronounced S1. Femoral pulses equal bilaterally, difficult to palpate bilateral dp pulses. Radial pulses palpable bilaterally. Pulmonary:     Effort: Tachypnea present.     Breath sounds: Normal breath sounds.  Chest:     Chest wall: Tenderness present.  Abdominal:     Palpations: Abdomen is soft.     Tenderness: There is abdominal tenderness (epigastric).  Musculoskeletal:     Cervical back: Neck supple.     Right lower leg: No tenderness. No edema.     Left lower leg: No tenderness. No edema.  Skin:    General: Skin is warm and dry.  Neurological:     General: No focal deficit present.     Mental Status: She is alert and oriented to person, place, and time.  Psychiatric:        Mood and Affect: Mood normal.     ED Results / Procedures / Treatments   Labs (all labs ordered are listed, but only abnormal results are displayed) Labs Reviewed  CBC WITH DIFFERENTIAL/PLATELET - Abnormal; Notable for the following components:      Result Value   WBC 32.9 (*)  RBC 2.88 (*)    Hemoglobin 8.7 (*)    HCT 26.7 (*)    RDW 15.6 (*)    Platelets 527 (*)    nRBC 0.8 (*)    Neutro Abs 29.5 (*)    Monocytes Absolute 1.4 (*)    Abs Immature Granulocytes 0.59 (*)    All other components within normal limits  BASIC METABOLIC PANEL - Abnormal; Notable for the following components:    Sodium 124 (*)    Potassium 5.2 (*)    Chloride 85 (*)    Glucose, Bld 103 (*)    BUN 31 (*)    Creatinine, Ser 1.31 (*)    Calcium 8.7 (*)    GFR, Estimated 45 (*)    All other components within normal limits  BRAIN NATRIURETIC PEPTIDE - Abnormal; Notable for the following components:   B Natriuretic Peptide 823.2 (*)    All other components within normal limits  MAGNESIUM - Abnormal; Notable for the following components:   Magnesium 2.5 (*)    All other components within normal limits  LACTIC ACID, PLASMA - Abnormal; Notable for the following components:   Lactic Acid, Venous 2.7 (*)    All other components within normal limits  LACTIC ACID, PLASMA - Abnormal; Notable for the following components:   Lactic Acid, Venous 2.4 (*)    All other components within normal limits  URINALYSIS, ROUTINE W REFLEX MICROSCOPIC - Abnormal; Notable for the following components:   APPearance HAZY (*)    Hgb urine dipstick MODERATE (*)    Ketones, ur 5 (*)    Protein, ur 100 (*)    Bacteria, UA FEW (*)    All other components within normal limits  TROPONIN I (HIGH SENSITIVITY) - Abnormal; Notable for the following components:   Troponin I (High Sensitivity) 161 (*)    All other components within normal limits  TROPONIN I (HIGH SENSITIVITY) - Abnormal; Notable for the following components:   Troponin I (High Sensitivity) 162 (*)    All other components within normal limits  CULTURE, BLOOD (ROUTINE X 2)  CULTURE, BLOOD (ROUTINE X 2)  GLUCOSE, CAPILLARY  APTT  PROTIME-INR  BASIC METABOLIC PANEL  HIV ANTIBODY (ROUTINE TESTING W REFLEX)  PROCALCITONIN  TYPE AND SCREEN    EKG EKG Interpretation  Date/Time:  Monday May 02 2021 07:07:54 EDT Ventricular Rate:  60 PR Interval:  179 QRS Duration: 80 QT Interval:  443 QTC Calculation: 443 R Axis:   19 Text Interpretation: Sinus rhythm Anterior infarct, old no acute ischemic appearance. QRS axis reversal ant since previous Confirmed by  Charlesetta Shanks [phone]) on 05/02/2021 7:15:12 AM   Radiology DG Chest 2 View  Result Date: 05/02/2021 CLINICAL DATA:  Sob chest pain post op 1 week ago heart surgery EXAM: CHEST - 2 VIEW COMPARISON:  04/24/2021 FINDINGS: Persistent small pleural effusions and bibasilar atelectasis/consolidation left greater than right. Heart size upper limits normal. Previous AVR. Aortic Atherosclerosis (ICD10-170.0). Ectatic thoracic aorta as previously documented. No pneumothorax. Moderate thoracolumbar dextroscoliosis without evident underlying vertebral anomaly. Surgical clips in the epigastrium. IMPRESSION: Persistent small pleural effusions and bibasilar consolidation/atelectasis. Electronically Signed   By: Lucrezia Europe M.D.   On: 05/02/2021 08:03   CT Head Wo Contrast  Result Date: 05/02/2021 CLINICAL DATA:  Mental status changes. EXAM: CT HEAD WITHOUT CONTRAST TECHNIQUE: Contiguous axial images were obtained from the base of the skull through the vertex without intravenous contrast. COMPARISON:  08/19/2017 FINDINGS: Brain: No acute intracranial abnormality. Specifically,  no hemorrhage, hydrocephalus, mass lesion, acute infarction, or significant intracranial injury. Vascular: No hyperdense vessel or unexpected calcification. Skull: No acute calvarial abnormality. Sinuses/Orbits: No acute findings Other: None IMPRESSION: No acute intracranial abnormality. Electronically Signed   By: Rolm Baptise M.D.   On: 05/02/2021 09:42   CT Chest Wo Contrast  Result Date: 05/02/2021 CLINICAL DATA:  Chest pain. EXAM: CT CHEST WITHOUT CONTRAST TECHNIQUE: Multidetector CT imaging of the chest was performed following the standard protocol without IV contrast. COMPARISON:  April 11, 2021. FINDINGS: Cardiovascular: Status post aortic valve repair and surgical repair of ascending thoracic aorta. Moderate size pericardial effusion is noted with some high density material suggesting some degree of hemopericardium. Atherosclerosis of  thoracic aorta is noted. Coronary artery calcifications are noted. Mediastinum/Nodes: No enlarged mediastinal or axillary lymph nodes. Thyroid gland, trachea, and esophagus demonstrate no significant findings. Lungs/Pleura: No pneumothorax is noted. Small bilateral pleural effusions are noted with adjacent atelectasis of both lower lobes. Probable loculated pleural effusions are noted involving the left major fissure. Upper Abdomen: No acute abnormality. Musculoskeletal: Moderate to severe dextroscoliosis of the thoracic and lumbar spine is noted. No acute osseous abnormality is noted. IMPRESSION: Status post aortic valve repair and surgical pair of ascending thoracic aorta. Moderate size pericardial effusion is noted with some high density material present suggesting some degree of hemopericardium. CT a of the chest is recommended to rule out aortic extravasation or injury. Critical Value/emergent results were called by telephone at the time of interpretation on 05/02/2021 at 10:24 am to provider [facility] , who verbally acknowledged these results. Small bilateral pleural effusions are noted with adjacent atelectasis of both lower lobes. Probable loculated pleural effusions are seen involving the left major fissure. Aortic Atherosclerosis (ICD10-I70.0). Electronically Signed   By: Marijo Conception M.D.   On: 05/02/2021 10:24    Procedures Procedures   Medications Ordered in ED Medications  vancomycin (VANCOREADY) IVPB 1000 mg/200 mL (1,000 mg Intravenous New Bag/Given 05/02/21 1119)  ceFEPIme (MAXIPIME) 1 g in sodium chloride 0.9 % 100 mL IVPB (has no administration in time range)  clevidipine (CLEVIPREX) infusion 0.5 mg/mL (2 mg/hr Intravenous Rate/Dose Change 05/02/21 1134)  ondansetron (ZOFRAN) injection 4 mg (has no administration in time range)  labetalol (NORMODYNE) injection 10 mg (has no administration in time range)  ondansetron (ZOFRAN) injection 4 mg (4 mg Intravenous Given 05/02/21 0850)   HYDROmorphone (DILAUDID) injection 0.5 mg (0.5 mg Intravenous Given 05/02/21 1020)  ceFEPIme (MAXIPIME) 2 g in sodium chloride 0.9 % 100 mL IVPB (0 g Intravenous Stopped 05/02/21 1112)  iohexol (OMNIPAQUE) 350 MG/ML injection 50 mL (50 mLs Intravenous Contrast Given 05/02/21 1159)    ED Course  I have reviewed the triage vital signs and the nursing notes.  Pertinent labs & imaging results that were available during my care of the patient were reviewed by me and considered in my medical decision making (see chart for details).    MDM Rules/Calculators/A&P                          65 year old female with history of recent aortic valve replacement presents emergency department for chest pain, shortness of breath, nausea, and bilateral lower extremity weakness.  Patient is hypertensive, otherwise vital signs are stable.  She does appear to have increased work of breathing, but is not acutely distressed.  Exam shows ill-appearing female with increased work of breathing.  Lungs are clear to auscultation bilaterally.  She does  have some chest pain that is reproducible along with epigastric tenderness to.  No rebound or guarding, no signs of peritonitis.  Does not have pronounced murmur.  Difficult to palpate peripheral pulses, but and able to Doppler PT pulses in bilateral lower extremities.  Unable to Doppler DP pulses.  Does have palpable pulses in both radial.  Most immediate concern is possible heart failure exacerbation.  Will obtain chest x-ray, labs, EKG and further evaluate for chest pain.  Additionally, have attempted to call Dr. Vivi Martens office.  Dr. Patrice Paradise, who is on-call, recommends speaking directly to Dr. Cyndia Bent.  Page placed.  Additionally, will order CT head for reports of confusion and add work-up for metabolic delirium.  Currently patient is alert and oriented x3.  No focal neurologic deficits.  Chest x-ray shows no acute changes, but does show persistent small pleural effusions.     EKG is normal sinus rhythm with a rate of 60.  No significant ischemic changes.  No acute arrhythmia.  Spoke with Dr. Roxy Manns again, he recommends paging Dr. Allene Dillon.  Did page Dr. Cyndia Bent who is in surgery.  As he will be in surgery all morning, recommended we reengage Dr. Roxy Manns to come see the patient.  Page placed for Dr. Roxy Manns.  Labs show elevated troponin, AKI, new hyponatremia, hyperkalemia, and lactic acidosis.  Due to poor appearance of patient, cultures drawn.  Patient has significant leukocytosis of 32 with left shift.  Broad-spectrum antibiotics given for possible sepsis.  With shortness of breath, could also certainly have an empyema.  Will obtain CT without contrast to further evaluate lung tissue.  Dr. Roxy Manns called back.  I reviewed the results with him.  He states he will send an APP and recommends we admit patient to hospitalist service.  Would also like cardiology to be consulted.  Paged hospitalist and cardiology.  Spoke to hospitalist, they will come to evaluate patient, will admit patient under their service.  Cardiology will also come evaluate patient for consultation.  CT shows moderate pericardial effusion concerning for possible hemopericardium.  Additionally, concern for possible extravasation from aorta.  My attending, Dr. Vallery Ridge, spoke with Dr. Roxy Manns guarding CT results.  He states he will personally come to bedside and probably arrived.  Recommend CTA which was ordered.  Additionally, Cleviprex drip started to manage hypertension.  After evaluation at bedside by Dr. Clydene Laming, he will admit to ICU.  Hospitalist updated.  Cardiology still to come provide consultation service.   Final Clinical Impression(s) / ED Diagnoses Final diagnoses:  Precordial pain  Shortness of breath    Rx / DC Orders ED Discharge Orders    None       Suzan Nailer, DO 05/02/21 1219    Charlesetta Shanks, MD 05/06/21 [phone]

## 2021-05-02 NOTE — Progress Notes (Signed)
  Echocardiogram 2D Echocardiogram has been performed.  Deborah Koch F 05/02/2021, 3:32 PM

## 2021-05-02 NOTE — ED Triage Notes (Signed)
Pt arrived via [facility]. Per EMS Bicuspid value replacement 5/5. Since pt has had Nausea, and shOB, left sided Chest pain, weakness. Denies vomiting, diarrhea.   88%RA, 2L 96%. A-Fib, 90-120HR, 166/110

## 2021-05-02 NOTE — Progress Notes (Addendum)
[facility] Progress Note Patient Name: Minaal Struckman DOB: 1956-02-21 MRN: [id number]   Date of Service  05/02/2021  HPI/Events of Note  Patient visualized via camera and labs checked.  eICU Interventions  Hemoglobin stable  (8.4 gm / dl  ), serum lactate 2.8.        Frederik Pear 05/02/2021, 8:54 PM

## 2021-05-02 NOTE — H&P (Signed)
NAME:  Farran Amsden, MRN:  [id number], DOB:  08/19/1956, LOS: 0 ADMISSION DATE:  05/02/2021, CONSULTATION DATE:  05/02/21 REFERRING MD:  Colvin Caroli- ED, CHIEF COMPLAINT:  Hypoxia, SOB   History of Present Illness:  Ms. Deborah Koch is a 65 y/o woman with a history of bicuspid aortic valve s/p SAVR and aortic aneurysm repair on 04/21/21 who was discharged 5/11 who presents with complaints of pressure like chest pain and worsening SOB today after several days of feeling week. She had been feeling well prior to discharge, but her sister brought her to the ED for evaluation of her progressive symptoms of weakness, fatigue. She has scoliosis that causes chronic pain in her upper back, which is currently at baseline without new CP or back pain. No LE symptoms. She has not taken her antihypertensives at home today.  CT scan performed at [facility] demonstrated bilateral pleural effusions, loculated on the left, and pericardial effusion. TCTS has been consulted for evaluation and recommended a CTA, echo, and BP control. PCCM consulted for admission.  Pertinent  Medical History  Bicuspid aortic valve s/p SAVR and aortic aneurysm repair 04/21/21, discharged 5/11 Scoliosis GERD C diff (2015) anxiety  Significant Hospital Events: Including procedures, antibiotic start and stop dates in addition to other pertinent events   . 5/16 admission  Interim History / Subjective:    Objective   Blood pressure (!) 181/98, pulse 64, temperature 97.6 F (36.4 C), temperature source Oral, resp. rate 14, height 5\' 2"  (1.575 m), weight 59.4 kg, SpO2 100 %.        Intake/Output Summary (Last 24 hours) at 05/02/2021 1124 Last data filed at 05/02/2021 1112 Gross per 24 hour  Intake 100 ml  Output --  Net 100 ml   Filed Weights   05/02/21 0700  Weight: 59.4 kg    Examination: General: chronically ill appearing woman lying in bed in NAD HENT: [place]/AT, eyes anicteric Lungs: rhales posteriorly, clear anteriorly,  breathing comfortably on [place] Cardiovascular: S1S2, quiet heart sounds, possibly delayed femoral pulses Abdomen: soft, NT Extremities: no c/c/e Neuro: awake & alert, normal speech, moving all extremities, no focal deficits Derm: healing sternal incision without erythema, no rashes or ecchymoses  Labs/imaging that I havepersonally reviewed  (right click and "Reselect all SmartList Selections" daily)  WBC 32.9 H/H 8.7/26.7 Platelets 527 LA 2.7>2.4 BNP 823.2 Trop 161 CT chest personally reviewed> no pulmonary edema, loculated left effusion posteriorly, dependent right effusion with overlying compressive atelectasis. Pericardial effusion.  Resolved Hospital Problem list     Assessment & Plan:  Acute respiratory failure with hypoxia Bilateral pleural effusions -supplemental O2 to maintain SpO2 >90% -repeat CT chest - aortic dissection protocol -appreciate TCTS's input  Hypertensive emergency History of severe AS and aortic aneurysm s/p bioprosthetic SAVR and aortic aneurysm repair -Labetalol to decrease BP; starting cleviprex infusion for goal of rapidly reducing SBP<120, goal HR <65 due to concern for aortic dissection. -appreciate TCTS's input -echo pending -can resume home antihypertensives once able to take PO  Chronic anxiety -ok to con't lexapro and clonazepam once able to take PO  Best practice (right click and "Reselect all SmartList Selections" daily)  Diet:  NPO Pain/Anxiety/Delirium protocol (if indicated): No VAP protocol (if indicated): Not indicated DVT prophylaxis: Contraindicated GI prophylaxis: N/A Glucose control:  n/a Central venous access:  N/A Arterial line:  N/A Foley:  N/A Mobility:  bed rest  PT consulted: N/A Last date of multidisciplinary goals of care discussion [5/16- sister, RN] Code Status:  full code  Disposition: ICU  Labs   CBC: Recent Labs  Lab 05/02/21 0757  WBC 32.9*  NEUTROABS 29.5*  HGB 8.7*  HCT 26.7*  MCV 92.7  PLT 527*     Basic Metabolic Panel: Recent Labs  Lab 05/02/21 0757  NA 124*  K 5.2*  CL 85*  CO2 26  GLUCOSE 103*  BUN 31*  CREATININE 1.31*  CALCIUM 8.7*  MG 2.5*   GFR: Estimated Creatinine Clearance: 33.9 mL/min (A) (by C-G formula based on SCr of 1.31 mg/dL (H)). Recent Labs  Lab 05/02/21 0757 05/02/21 0807 05/02/21 1033  WBC 32.9*  --   --   LATICACIDVEN  --  2.7* 2.4*    Liver Function Tests: No results for input(s): AST, ALT, ALKPHOS, BILITOT, PROT, ALBUMIN in the last 168 hours. No results for input(s): LIPASE, AMYLASE in the last 168 hours. No results for input(s): AMMONIA in the last 168 hours.  ABG    Component Value Date/Time   PHART 7.349 (L) 04/22/2021 0446   PCO2ART 41.4 04/22/2021 0446   PO2ART 66 (L) 04/22/2021 0446   HCO3 23.0 04/22/2021 0446   TCO2 24 04/22/2021 0446   ACIDBASEDEF 3.0 (H) 04/22/2021 0446   O2SAT 92.0 04/22/2021 0446     Coagulation Profile: No results for input(s): INR, PROTIME in the last 168 hours.  Cardiac Enzymes: No results for input(s): CKTOTAL, CKMB, CKMBINDEX, TROPONINI in the last 168 hours.  HbA1C: Hgb A1c MFr Bld  Date/Time Value Ref Range Status  04/19/2021 02:40 PM 5.2 4.8 - 5.6 % Final    Comment:    (NOTE) Pre diabetes:          5.7%-6.4%  Diabetes:              >6.4%  Glycemic control for   <7.0% adults with diabetes   05/05/2020 11:57 AM 5.6 4.6 - 6.5 % Final    Comment:    Glycemic Control Guidelines for People with Diabetes:Non Diabetic:  <6%Goal of Therapy: <7%Additional Action Suggested:  >8%     CBG: No results for input(s): GLUCAP in the last 168 hours.  Review of Systems:   Review of Systems  Constitutional: Positive for malaise/fatigue. Negative for chills and fever.  HENT: Negative for congestion and sore throat.   Respiratory: Positive for shortness of breath. Negative for cough, hemoptysis and wheezing.   Cardiovascular: Negative for chest pain and leg swelling.  Gastrointestinal:  Negative for heartburn, nausea and vomiting.  Genitourinary: Negative.   Musculoskeletal: Positive for back pain. Negative for myalgias.  Skin: Negative for rash.  Neurological: Positive for weakness. Negative for focal weakness.  Endo/Heme/Allergies: Negative for environmental allergies.     Past Medical History:  She,  has a past medical history of Anxiety, C. difficile colitis, Congenital bicuspid aortic valve, Depression, Dilated Ascending Aorta, Diverticulitis, GERD (gastroesophageal reflux disease), H/O mastitis (2015), Heart murmur, History of tobacco abuse, HTN (hypertension), Hypokalemia, Moderate to severe aortic stenosis, Multilevel degenerative disc disease, Pneumonia, and Scoliosis.   Surgical History:   Past Surgical History:  Procedure Laterality Date  . AORTA -INNOMIATE BYPASS N/A 04/21/2021   Procedure: AORTA -[place] BYPASS;  Surgeon: Gaye Pollack, MD;  Location: [facility] OR;  Service: Vascular;  Laterality: N/A;  . AORTIC VALVE REPLACEMENT N/A 04/21/2021   Procedure: AORTIC VALVE REPLACEMENT (AVR) ON PUMP WITH 23MM INSPIRIS AORTIC VALVE;  Surgeon: Gaye Pollack, MD;  Location: Home;  Service: Open Heart Surgery;  Laterality: N/A;  . BARTHOLIN GLAND CYST EXCISION    .  BREAST BIOPSY Left    neg  . CAROTID-SUBCLAVIAN BYPASS GRAFT Left 04/21/2021   Procedure: AORTA LEFT COMMON CAROTID BYPASS;  Surgeon: Gaye Pollack, MD;  Location: [facility] OR;  Service: Vascular;  Laterality: Left;  . COLONOSCOPY  2013   Dr. Allen Norris  . COLONOSCOPY WITH PROPOFOL N/A 10/23/2017   Procedure: COLONOSCOPY WITH PROPOFOL;  Surgeon: Lucilla Lame, MD;  Location: [facility] ENDOSCOPY;  Service: Endoscopy;  Laterality: N/A;  . OOPHORECTOMY  2016  . OVARY SURGERY  2016   cyst on right ovary  . REPLACEMENT ASCENDING AORTA N/A 04/21/2021   Procedure: REPLACEMENT ASCENDING & ARCH AORTA WITH HEMASHIELD PLATINUM GRAFT 28 [id number];  Surgeon: Gaye Pollack, MD;  Location: [place];  Service: Open Heart Surgery;   Laterality: N/A;  CIRC ARREST  RIGHT AXILLARY ARTERY CANNULATION  . RIGHT/LEFT HEART CATH AND CORONARY ANGIOGRAPHY N/A 03/24/2021   Procedure: RIGHT/LEFT HEART CATH AND CORONARY ANGIOGRAPHY;  Surgeon: Minna Merritts, MD;  Location: [facility];  Service: Cardiovascular;  Laterality: N/A;  . SALPINGECTOMY Bilateral   . TEE WITHOUT CARDIOVERSION N/A 04/21/2021   Procedure: TRANSESOPHAGEAL ECHOCARDIOGRAM (TEE);  Surgeon: Gaye Pollack, MD;  Location: [place];  Service: Open Heart Surgery;  Laterality: N/A;     Social History:   reports that she has been smoking cigarettes. She has a 5.00 pack-year smoking history. She has never used smokeless tobacco. She reports current alcohol use of about 12.0 standard drinks of alcohol per week. She reports that she does not use drugs.   Family History:  Her family history includes Alcohol abuse in her brother; Anxiety disorder in her mother and sister; Breast cancer in her maternal aunt; Colon cancer in her mother; Depression in her mother and sister; Heart failure in her father; Hypertension in her father and mother; Schizophrenia in her brother. There is no history of Heart attack or Stroke.   Allergies Allergies  Allergen Reactions  . Iodine Rash  . Vicodin [Hydrocodone-Acetaminophen] Hives and Rash     Home Medications  Prior to Admission medications   Medication Sig Start Date End Date Taking? Authorizing Provider  aspirin EC 325 MG EC tablet Take 1 tablet (325 mg total) by mouth daily. 04/27/21  Yes Roddenberry, Myron G, PA-C  clonazePAM (KLONOPIN) 0.5 MG tablet TAKE 1 TABLET BY MOUTH TWICE A DAY AS NEEDED Patient taking differently: Take 0.5 mg by mouth 2 (two) times daily. 03/11/21  Yes Burnard Hawthorne, FNP  escitalopram (LEXAPRO) 10 MG tablet Take 1 tablet (10 mg total) by mouth daily. Patient taking differently: Take 10 mg by mouth in the morning. 11/22/20  Yes Arnett, [name], FNP  lisinopril (ZESTRIL) 20 MG tablet Take 1 tablet  (20 mg total) by mouth daily. Patient taking differently: Take 20 mg by mouth in the morning. 11/22/20  Yes Burnard Hawthorne, FNP  Metoprolol Tartrate 75 MG TABS Take 75 mg by mouth 2 (two) times daily. 04/27/21  Yes Roddenberry, Arlis Porta, PA-C  rosuvastatin (CRESTOR) 5 MG tablet Take 1 tablet (5 mg total) by mouth daily. Patient taking differently: Take 5 mg by mouth in the morning. 03/22/21 06/20/21 Yes Visser, Jacquelyn D, PA-C  traMADol (ULTRAM) 50 MG tablet Take 1 tablet (50 mg total) by mouth every 4 (four) hours as needed for up to 5 days for moderate pain. 04/27/21 05/02/21 Yes Antony Odea, PA-C     Critical care time: 55 minutes      Julian Hy, DO 05/02/21 11:54  AM [facility]

## 2021-05-02 NOTE — ED Notes (Signed)
Report given to Enzo Bi, RN of [phone]

## 2021-05-02 NOTE — Progress Notes (Signed)
TCTS BRIEF PROGRESS NOTE  Patient seen and examined and discussed at bedside with Dr. Vallery Ridge.  I have personally reviewed the patient's brief presentation history, laboratory data, and noncontrast CT scan of the chest.  Patient's primary complaint is that of generalized weakness that has developed over the last few days.  She denies febrile illness.  She has had some increased shortness of breath but is currently breathing comfortably on 4 L nasal cannula.  She denies any significant chest pain and on exam her sternal incision appears to be healing very nicely with no sign of instability or tenderness on palpation.  Laboratory data are notable for marked leukocytosis, hyponatremia, and mildly elevated creatinine 1.3.  Hemoglobin and hematocrit are both essentially unchanged from prior to recent hospital discharge.  I agree w/ plans for IV Cleviprex and ICU admission to bring pressure under control, broad spectrum antibiotics and STAT echocardiogram.  Patient also needs CTA chest aortic dissection protocol.  Full consultation note to follow.  Rexene Alberts, MD 05/02/2021 10:57 AM

## 2021-05-03 ENCOUNTER — Inpatient Hospital Stay (HOSPITAL_COMMUNITY): Payer: Medicare Other

## 2021-05-03 DIAGNOSIS — I161 Hypertensive emergency: Secondary | ICD-10-CM | POA: Diagnosis not present

## 2021-05-03 DIAGNOSIS — J90 Pleural effusion, not elsewhere classified: Secondary | ICD-10-CM | POA: Diagnosis not present

## 2021-05-03 DIAGNOSIS — R410 Disorientation, unspecified: Secondary | ICD-10-CM | POA: Diagnosis not present

## 2021-05-03 DIAGNOSIS — I313 Pericardial effusion (noninflammatory): Secondary | ICD-10-CM

## 2021-05-03 DIAGNOSIS — I4891 Unspecified atrial fibrillation: Secondary | ICD-10-CM

## 2021-05-03 LAB — BLOOD CULTURE ID PANEL (REFLEXED) - BCID2

## 2021-05-03 LAB — BASIC METABOLIC PANEL
Anion gap: 11 (ref 5–15)
Anion gap: 13 (ref 5–15)
BUN: 25 mg/dL — ABNORMAL HIGH (ref 8–23)
BUN: 19 mg/dL (ref 8–23)
CO2: 26 mmol/L (ref 22–32)
CO2: 24 mmol/L (ref 22–32)
Calcium: 8.7 mg/dL — ABNORMAL LOW (ref 8.9–10.3)
Calcium: 8.7 mg/dL — ABNORMAL LOW (ref 8.9–10.3)
Chloride: 90 mmol/L — ABNORMAL LOW (ref 98–111)
Chloride: 92 mmol/L — ABNORMAL LOW (ref 98–111)
Creatinine, Ser: 0.92 mg/dL (ref 0.44–1.00)
Creatinine, Ser: 0.86 mg/dL (ref 0.44–1.00)
GFR, Estimated: >60 mL/min (ref >60)
GFR, Estimated: >60 mL/min (ref >60)
Glucose, Bld: 110 mg/dL — ABNORMAL HIGH (ref 70–99)
Glucose, Bld: 96 mg/dL (ref 70–99)
Potassium: 5.4 mmol/L — ABNORMAL HIGH (ref 3.5–5.1)
Potassium: 3.9 mmol/L (ref 3.5–5.1)
Sodium: 127 mmol/L — ABNORMAL LOW (ref 135–145)
Sodium: 129 mmol/L — ABNORMAL LOW (ref 135–145)

## 2021-05-03 LAB — GLUCOSE, CAPILLARY
Glucose-Capillary: 103 mg/dL — ABNORMAL HIGH (ref 70–99)
Glucose-Capillary: 111 mg/dL — ABNORMAL HIGH (ref 70–99)
Glucose-Capillary: 87 mg/dL (ref 70–99)
Glucose-Capillary: 87 mg/dL (ref 70–99)
Glucose-Capillary: 92 mg/dL (ref 70–99)
Glucose-Capillary: 97 mg/dL (ref 70–99)

## 2021-05-03 LAB — CBC
HCT: 24.4 % — ABNORMAL LOW (ref 36.0–46.0)
Hemoglobin: 8 g/dL — ABNORMAL LOW (ref 12.0–15.0)
MCH: 30.4 pg (ref 26.0–34.0)
MCHC: 32.8 g/dL (ref 30.0–36.0)
MCV: 92.8 fL (ref 80.0–100.0)
Platelets: 456 10*3/uL — ABNORMAL HIGH (ref 150–400)
RBC: 2.63 MIL/uL — ABNORMAL LOW (ref 3.87–5.11)
RDW: 15.8 % — ABNORMAL HIGH (ref 11.5–15.5)
WBC: 30.2 10*3/uL — ABNORMAL HIGH (ref 4.0–10.5)
nRBC: 1.5 % — ABNORMAL HIGH (ref 0.0–0.2)

## 2021-05-03 LAB — OSMOLALITY: Osmolality: 279 mOsm/kg (ref 275–295)

## 2021-05-03 LAB — POCT I-STAT 7, (LYTES, BLD GAS, ICA,H+H)
Acid-Base Excess: 2 mmol/L (ref 0.0–2.0)
Bicarbonate: 25.7 mmol/L (ref 20.0–28.0)
Calcium, Ion: 1.17 mmol/L (ref 1.15–1.40)
HCT: 27 % — ABNORMAL LOW (ref 36.0–46.0)
Hemoglobin: 9.2 g/dL — ABNORMAL LOW (ref 12.0–15.0)
O2 Saturation: 93 %
Patient temperature: 98.6
Potassium: 4 mmol/L (ref 3.5–5.1)
Sodium: 128 mmol/L — ABNORMAL LOW (ref 135–145)
TCO2: 27 mmol/L (ref 22–32)
pCO2 arterial: 36.3 mmHg (ref 32.0–48.0)
pH, Arterial: 7.458 — ABNORMAL HIGH (ref 7.350–7.450)
pO2, Arterial: 63 mmHg — ABNORMAL LOW (ref 83.0–108.0)

## 2021-05-03 LAB — LACTIC ACID, PLASMA
Lactic Acid, Venous: 2.2 mmol/L (ref 0.5–1.9)
Lactic Acid, Venous: 1.7 mmol/L (ref 0.5–1.9)

## 2021-05-03 LAB — OSMOLALITY, URINE: Osmolality, Ur: 313 mOsm/kg (ref 300–900)

## 2021-05-03 LAB — PROCALCITONIN: Procalcitonin: 0.76 ng/mL

## 2021-05-03 LAB — PROTIME-INR
INR: 1.4 — ABNORMAL HIGH (ref 0.8–1.2)
Prothrombin Time: 17.1 seconds — ABNORMAL HIGH (ref 11.4–15.2)

## 2021-05-03 LAB — SODIUM, URINE, RANDOM: Sodium, Ur: 53 mmol/L

## 2021-05-03 MED ORDER — CLEVIDIPINE BUTYRATE 0.5 MG/ML IV EMUL
0.0000 mg/h | INTRAVENOUS | Status: AC
  Filled 2021-05-03: qty 50

## 2021-05-03 MED ORDER — SODIUM CHLORIDE 0.9 % IV SOLN
2.0000 g | Freq: Two times a day (BID) | INTRAVENOUS | Status: DC
  Administered 2021-05-03 – 2021-05-04 (×4): 2 g via INTRAVENOUS
  Filled 2021-05-03 (×5): qty 2

## 2021-05-03 MED ORDER — FUROSEMIDE 10 MG/ML IJ SOLN
40.0000 mg | Freq: Once | INTRAMUSCULAR | Status: AC
  Administered 2021-05-03: 40 mg via INTRAVENOUS
  Filled 2021-05-03: qty 4

## 2021-05-03 MED ORDER — METOPROLOL TARTRATE 5 MG/5ML IV SOLN: 5.0000 mg | Freq: Once | INTRAVENOUS | Status: AC

## 2021-05-03 MED ORDER — HALOPERIDOL LACTATE 5 MG/ML IJ SOLN
1.0000 mg | Freq: Once | INTRAMUSCULAR | Status: AC
  Administered 2021-05-03: 1 mg via INTRAVENOUS

## 2021-05-03 MED ORDER — CLEVIDIPINE BUTYRATE 0.5 MG/ML IV EMUL
0.0000 mg/h | INTRAVENOUS | Status: AC
  Administered 2021-05-03: 5 mg/h via INTRAVENOUS
  Filled 2021-05-03: qty 50

## 2021-05-03 MED ORDER — VANCOMYCIN HCL 1000 MG/200ML IV SOLN
1000.0000 mg | INTRAVENOUS | Status: DC
  Filled 2021-05-03: qty 200

## 2021-05-03 MED ORDER — HALOPERIDOL LACTATE 5 MG/ML IJ SOLN
INTRAMUSCULAR | Status: AC
  Filled 2021-05-03: qty 1

## 2021-05-03 MED ORDER — HALOPERIDOL LACTATE 5 MG/ML IJ SOLN: 1.0000 mg | Freq: Once | INTRAMUSCULAR | Status: AC

## 2021-05-03 MED ORDER — METOPROLOL TARTRATE 5 MG/5ML IV SOLN
INTRAVENOUS | Status: AC
  Administered 2021-05-03: 5 mg via INTRAVENOUS
  Filled 2021-05-03: qty 5

## 2021-05-03 MED ORDER — ESMOLOL HCL-SODIUM CHLORIDE 2000 MG/100ML IV SOLN
25.0000 ug/kg/min | INTRAVENOUS | Status: DC
  Administered 2021-05-03: 150 ug/kg/min via INTRAVENOUS
  Administered 2021-05-03: 25 ug/kg/min via INTRAVENOUS
  Administered 2021-05-03: 150 ug/kg/min via INTRAVENOUS
  Administered 2021-05-04: 125 ug/kg/min via INTRAVENOUS
  Administered 2021-05-04: 75 ug/kg/min via INTRAVENOUS
  Filled 2021-05-03 (×6): qty 100

## 2021-05-03 NOTE — Progress Notes (Signed)
TCTS Subjective:  Feels better today. She had some confusion and agitation this am.  Martin Majestic into rapid atrial fib with RVR.  Objective: Vital signs in last 24 hours: Temp:  [97.6 F (36.4 C)-98.5 F (36.9 C)] 97.9 F (36.6 C) (05/17 1532) Pulse Rate:  [61-134] 86 (05/17 1600) Cardiac Rhythm: Atrial fibrillation (05/17 0800) Resp:  [14-35] 20 (05/17 1600) BP: (98-162)/(65-128) 133/93 (05/17 1600) SpO2:  [88 %-100 %] 93 % (05/17 1600) FiO2 (%):  [50 %] 50 % (05/17 1520) Weight:  [62.2 kg] 62.2 kg (05/17 0500)  Hemodynamic parameters for last 24 hours:    Intake/Output from previous day: 05/16 0701 - 05/17 0700 In: 1596.5 [I.V.:226.6; IV Piggyback:1369.9] Out: 1075 [Urine:1075] Intake/Output this shift: Total I/O In: 292.9 [I.V.:192.8; IV Piggyback:100.1] Out: 800 [Urine:800]  General appearance: alert and cooperative Neurologic: intact Heart: regular rate and rhythm and soft systolic murmur RSB. Lungs: diminished breath sounds bibasilar Abdomen: soft, non-tender; bowel sounds normal; no masses Extremities: edema mild Wound: incisions healing well. sternum stable.  Lab Results: Recent Labs    05/02/21 2000 05/03/21 0127 05/03/21 0649  WBC 31.4* 30.2*  --   HGB 8.4* 8.0* 9.2*  HCT 24.4* 24.4* 27.0*  PLT 350 456*  --    BMET:  Recent Labs    05/03/21 0127 05/03/21 0649 05/03/21 1315  NA 127* 128* 129*  K 5.4* 4.0 3.9  CL 90*  --  92*  CO2 26  --  24  GLUCOSE 110*  --  96  BUN 25*  --  19  CREATININE 0.92  --  0.86  CALCIUM 8.7*  --  8.7*    PT/INR:  Recent Labs    05/03/21 0127  LABPROT 17.1*  INR 1.4*   ABG    Component Value Date/Time   PHART 7.458 (H) 05/03/2021 0649   HCO3 25.7 05/03/2021 0649   TCO2 27 05/03/2021 0649   ACIDBASEDEF 3.0 (H) 04/22/2021 0446   O2SAT 93.0 05/03/2021 0649   CBG (last 3)  Recent Labs    05/03/21 0726 05/03/21 1132 05/03/21 1529  GLUCAP 111* 92 97   Narrative & Impression  CLINICAL DATA:  Pleural  effusion, shortness of breath, chest pain  EXAM: PORTABLE CHEST 1 VIEW  COMPARISON:  05/02/2021  FINDINGS: Cardiomegaly. Prior median sternotomy and valve replacement. Layering bilateral effusions with bibasilar atelectasis or infiltrates. Findings are similar to prior study.  IMPRESSION: No significant change.   Electronically Signed   By: Rolm Baptise M.D.   On: 05/03/2021 10:06    Assessment/Plan:  POD 12 AVR with pericardial valve, replacement of ascending aortic and arch aneurysm with grafts to innominate and left common carotid artery.   She did well initially but now admitted with possible sepsis with marked leukocytosis, elevated lactic acid and PCT, acute hypoxemic respiratory failure.  1/4 blood cultures positive for Staph epi, likely a contaminate. Continues on broad spectrum antibiotics.  She also has moderate right pleural effusion, small left pleural effusion and probably hemorrhagic pericardial effusion. It is possible that she could have Dressler's syndrome and not sepsis, although WBC ct usually not that high.  I think she would benefit from right thoracentesis to allow reexpansion of right lung. That has been scheduled for tomorrow.  Her pericardial effusion is primarily posterior, moderate with no hemodynamic compromise and no signs of tamponade so I would follow that with a repeat echo in a week or so. Some effusion early after heart surgery is very common especially the type of  surgery she had.  I am not sure about the etiology of the density around the spleen that looks like subcapsular hematoma. She denies trauma to that area. She fell on the front of her chest. Her abdomen is benign so I would follow that. I guess it could be spontaneous from anticoagulation during surgery but I have never seen that.   Paroxysmal atrial fib: not unusual with all of this going on post heart surgery. I would not anticoagulate her with questionable hemorrhagic  pericardial effusion and possible splenic subcapsular hematoma.  I discussed status with her son at bedside. We will continue to follow.  LOS: 1 day    Gaye Pollack 05/03/2021

## 2021-05-03 NOTE — Progress Notes (Signed)
Performed swallow evaluation before administering scheduled PO med's @2200 . Patient drank 3 oz of water without stopping or coughing. Then proceeded to give PO med's. Patient had some difficulty swallowing, but no coughing.

## 2021-05-03 NOTE — Progress Notes (Signed)
[facility] Progress Note Patient Name: Deborah Koch DOB: [birth year] MRN: [id number]   Date of Service  05/03/2021  HPI/Events of Note  Patient developed confusion and agitated delirium.  eICU Interventions  Stat ABG ordered to r/o hypercapnia, Haldol 1 mg iv ordered with some improvement in agitation following the Haldol, bilateral soft wrist restraints ordered for safety.        [name] 05/03/2021, 7:03 AM

## 2021-05-03 NOTE — Progress Notes (Signed)
NAME:  Deborah Koch, MRN:  [id number], DOB:  [birth year], LOS: 1 ADMISSION DATE:  05/02/2021, CONSULTATION DATE:   05/02/21 REFERRING MD:  Colvin Caroli- ED, CHIEF COMPLAINT:  Hypoxia, SOB   History of Present Illness:  Deborah Koch is a 65 y/o woman with a history of bicuspid aortic valve s/p SAVR and aortic aneurysm repair on 04/21/21 who was discharged 5/11 who presents with complaints of pressure like chest pain and worsening SOB today after several days of feeling week. She had been feeling well prior to discharge, but her sister brought her to the ED for evaluation of her progressive symptoms of weakness, fatigue. She has scoliosis that causes chronic pain in her upper back, which is currently at baseline without new CP or back pain. No LE symptoms. She has not taken her antihypertensives at home today.  CT scan performed at [facility] demonstrated bilateral pleural effusions, loculated on the left, and pericardial effusion. TCTS has been consulted for evaluation and recommended a CTA, echo, and BP control. PCCM consulted for admission.  Pertinent  Medical History  Bicuspid aortic valve s/p SAVR and aortic aneurysm repair 04/21/21, discharged 5/11 Scoliosis GERD C diff (2015) Anxiety and depression Significant Hospital Events: Including procedures, antibiotic start and stop dates in addition to other pertinent events    5/16 admission  Interim History / Subjective:  Patient is asleep. She is confused when wakes up. Had agitated delirium overnight and received 1 mg of IV haldol with some improvement but still yells. Soft restrain applied. Also received IV Metop for Afib w RVR w HR of 130-140.  Objective   Blood pressure (!) 114/100, pulse (!) 119, temperature 97.7 F (36.5 C), temperature source Oral, resp. rate (!) 24, height 5\' 2"  (1.575 m), weight 62.2 kg, SpO2 94 %.    FiO2 (%):  [28 %-100 %] 100 %   Intake/Output Summary (Last 24 hours) at 05/03/2021 0817 Last data filed at 05/03/2021  0700 Gross per 24 hour  Intake 1596.5 ml  Output 1075 ml  Net 521.5 ml   Filed Weights   05/02/21 0700 05/03/21 0500  Weight: 59.4 kg 62.2 kg    Examination: General: Resting comfortably Lungs: Anterior lung exam is clear to auscultation  Chest: Surgical incision site is clear and well healing Cardiovascular: Irregular rhythm. No murmur Abdomen: Soft, not distended, non tender to palpation Extremities: Warm. Well perfused. No edema Neuro: Resting comfortably, confused when wakes up GU: PureWick in place  Labs/imaging that I havepersonally reviewed  (right click and "Reselect all SmartList Selections" daily)  Hb 9.2<8 , wbc 30 K 5.4 Na 128 BC pending  Resolved Hospital Problem list   Na  Assessment & Plan:  Deborah Koch is a 65 y.o. female with hx of congential bicuspid aortic valve with development of subsequent severe aortic stenosis and poststenotic aortic dilatation, s/p recent (5-01/2021) bioprosthetic AVR/aortic root replacement who presents 5/16 with CP and acute dyspnea, concern for sepsis. Started on Vanc and Cefepime. BP was elevated and she has been on IV CCB  SIRS: WBC 30000 today < 33000 on arrival LA 2.6<2.2<2.4<2.7. But no source of infection.  Acute respiratory failure with hypoxemia: Maybe 2/2 rt pleural effusion/rt lower lobe collapse Rt pleural effusion: CXR and CT 5/16: Small to moderated rt side pleural effusion and small loculated effusions are seen involving the left major fissure with mild atelectasis in the left lower lobe.   Pleural effusions is probably related to previous surgery. IR consulted for R thora given  recent chest tubes and chest instrumentation. CT head 5/16 unremarkable CT chest w pericardial effusion, likley hemopericardium.  BNP elevated at 823 Trop up at 161 UA 5/16: Moderate Hb, 6-10 RBC, few bacteria, negative nitrate, Pr 100. UC not ordered. BC negative<24h Procal 0.76<0.67 Recent Labs    05/03/21 0649  PHART  7.458*  PO2ART 63*  TCO2 27  HCO3 25.7    P:  -On Vanc and cefepime. Will DC Vanc today - Follow BC -Trend LA -Currently on nonrebreather. ABG this AM:  -Lasix 40 mg IV once -Repeat CXR -Hold off od thoracentesis today and will reassess after diuresis and w CXR  Anemia: Stable Pericardial effusion: (Possible hemorrhagic): No sign of Tamponade. Possible perisplenic hematoma: Unclear etiology. Will monitor  Echo 5/16: Moderate pericardial effusion surrounds heart with some consolidation  (? clot) except for dilated IVC, effusion does not appear to be  hemodynamically compromising by echo criteria. Moderate pericardial  effusion. No plan for pericardiocentesis per prior cardiology note.   Pericardial effusion is located mostly posteriorly and may not be straightforward to drain surgically per Ct surgery note. Will follow their recommendations.   Possibly sources of bleeding- spleen, hemorhagic pericardial effusion.   -Repeat CXR this morning with no change -CT surgery and cardiology are assisting. Apprecitae recommendations  Hyponatremia: Na 128<127<126 on arrival. Will order labs for work up Hyperkalemia: K 5.4 today  -Will give lasix 40 mg IV once and repeat BMP this PM -Serum osm, urine Na, urine osm  AKI: Resolved Cr on arrival: Cr 1.31 on 5/16 from 0.78 on 04/25/21.  -Ct today 0.9 -BMP daily  Afib w RVR: Required IV metop overnight.  -On Metop 50 mg BID  -On Cleviprex. Will DC and switch to Esmolol drip   Best practice (right click and "Reselect all SmartList Selections" daily)  Diet:  NPO Pain/Anxiety/Delirium protocol (if indicated): No VAP protocol (if indicated): Not indicated DVT prophylaxis: SCD GI prophylaxis: N/A Glucose control:   Central venous access:  Arterial line:  N/A Foley:  N/A Mobility:  bed rest  PT consulted: N/A Last date of multidisciplinary goals of care discussion []  Code Status:  full code Disposition: Stays in ICU  Labs    CBC: Recent Labs  Lab 05/02/21 0757 05/02/21 1539 05/02/21 2000 05/03/21 0127 05/03/21 0649  WBC 32.9* 32.5* 31.4* 30.2*  --   NEUTROABS 29.5*  --   --   --   --   HGB 8.7* 8.1* 8.4* 8.0* 9.2*  HCT 26.7* 24.4* 24.4* 24.4* 27.0*  MCV 92.7 92.1 89.4 92.8  --   PLT 527* 486* 350 456*  --     Basic Metabolic Panel: Recent Labs  Lab 05/02/21 0757 05/02/21 1253 05/02/21 1539 05/03/21 0127 05/03/21 0649  NA 124* 124* 126* 127* 128*  K 5.2* 4.7 4.9 5.4* 4.0  CL 85* 86* 88* 90*  --   CO2 26 27 26 26   --   GLUCOSE 103* 92 99 110*  --   BUN 31* 30* 29* 25*  --   CREATININE 1.31* 1.13* 1.17* 0.92  --   CALCIUM 8.7* 8.6* 8.6* 8.7*  --   MG 2.5*  --   --   --   --    GFR: Estimated Creatinine Clearance: 52.8 mL/min (by C-G formula based on SCr of 0.92 mg/dL). Recent Labs  Lab 05/02/21 0757 05/02/21 0807 05/02/21 1033 05/02/21 1253 05/02/21 1347 05/02/21 1539 05/02/21 2000 05/02/21 2050 05/03/21 0127  PROCALCITON  --   --   --  0.67  --   --   --   --  0.76  WBC 32.9*  --   --   --   --  32.5* 31.4*  --  30.2*  LATICACIDVEN  --    < > 2.4*  --  2.2* 2.6*  --  2.8*  --    < > = values in this interval not displayed.    Liver Function Tests: No results for input(s): AST, ALT, ALKPHOS, BILITOT, PROT, ALBUMIN in the last 168 hours. No results for input(s): LIPASE, AMYLASE in the last 168 hours. No results for input(s): AMMONIA in the last 168 hours.  ABG    Component Value Date/Time   PHART 7.458 (H) 05/03/2021 0649   PCO2ART 36.3 05/03/2021 0649   PO2ART 63 (L) 05/03/2021 0649   HCO3 25.7 05/03/2021 0649   TCO2 27 05/03/2021 0649   ACIDBASEDEF 3.0 (H) 04/22/2021 0446   O2SAT 93.0 05/03/2021 0649     Coagulation Profile: Recent Labs  Lab 05/02/21 1253 05/03/21 0127  INR 1.6* 1.4*    Cardiac Enzymes: No results for input(s): CKTOTAL, CKMB, CKMBINDEX, TROPONINI in the last 168 hours.  HbA1C: Hgb A1c MFr Bld  Date/Time Value Ref Range Status  04/19/2021  02:40 PM 5.2 4.8 - 5.6 % Final    Comment:    (NOTE) Pre diabetes:          5.7%-6.4%  Diabetes:              >6.4%  Glycemic control for   <7.0% adults with diabetes   05/05/2020 11:57 AM 5.6 4.6 - 6.5 % Final    Comment:    Glycemic Control Guidelines for People with Diabetes:Non Diabetic:  <6%Goal of Therapy: <7%Additional Action Suggested:  >8%     CBG: Recent Labs  Lab 05/02/21 1508 05/02/21 2016 05/02/21 2356 05/03/21 0339 05/03/21 0726  GLUCAP 91 106* 105* 103* 111*    Review of Systems:   DOE, weakness  Past Medical History:  She,  has a past medical history of Anxiety, C. difficile colitis, Congenital bicuspid aortic valve, Depression, Dilated Ascending Aorta, Diverticulitis, GERD (gastroesophageal reflux disease), H/O mastitis (2015), Heart murmur, History of tobacco abuse, HTN (hypertension), Hypokalemia, Moderate to severe aortic stenosis, Multilevel degenerative disc disease, Pneumonia, and Scoliosis.   Surgical History:   Past Surgical History:  Procedure Laterality Date  . AORTA -INNOMIATE BYPASS N/A 04/21/2021   Procedure: AORTA -[place] BYPASS;  Surgeon: Gaye Pollack, MD;  Location: [facility] OR;  Service: Vascular;  Laterality: N/A;  . AORTIC VALVE REPLACEMENT N/A 04/21/2021   Procedure: AORTIC VALVE REPLACEMENT (AVR) ON PUMP WITH 23MM INSPIRIS AORTIC VALVE;  Surgeon: Gaye Pollack, MD;  Location: [facility];  Service: Open Heart Surgery;  Laterality: N/A;  . BARTHOLIN GLAND CYST EXCISION    . BREAST BIOPSY Left    neg  . CAROTID-SUBCLAVIAN BYPASS GRAFT Left 04/21/2021   Procedure: AORTA LEFT COMMON CAROTID BYPASS;  Surgeon: Gaye Pollack, MD;  Location: [facility] OR;  Service: Vascular;  Laterality: Left;  . COLONOSCOPY  2013   Dr. Allen Norris  . COLONOSCOPY WITH PROPOFOL N/A 10/23/2017   Procedure: COLONOSCOPY WITH PROPOFOL;  Surgeon: Lucilla Lame, MD;  Location: [facility] ENDOSCOPY;  Service: Endoscopy;  Laterality: N/A;  . OOPHORECTOMY  2016  . OVARY SURGERY  2016   cyst  on right ovary  . REPLACEMENT ASCENDING AORTA N/A 04/21/2021   Procedure: REPLACEMENT ASCENDING & ARCH AORTA WITH HEMASHIELD PLATINUM GRAFT 28 [id number];  Surgeon:  Gaye Pollack, MD;  Location: [facility] OR;  Service: Open Heart Surgery;  Laterality: N/A;  CIRC ARREST  RIGHT AXILLARY ARTERY CANNULATION  . RIGHT/LEFT HEART CATH AND CORONARY ANGIOGRAPHY N/A 03/24/2021   Procedure: RIGHT/LEFT HEART CATH AND CORONARY ANGIOGRAPHY;  Surgeon: Minna Merritts, MD;  Location: [place] LAB;  Service: Cardiovascular;  Laterality: N/A;  . SALPINGECTOMY Bilateral   . TEE WITHOUT CARDIOVERSION N/A 04/21/2021   Procedure: TRANSESOPHAGEAL ECHOCARDIOGRAM (TEE);  Surgeon: Gaye Pollack, MD;  Location: [facility];  Service: Open Heart Surgery;  Laterality: N/A;     Social History:   reports that she has been smoking cigarettes. She has a 5.00 pack-year smoking history. She has never used smokeless tobacco. She reports current alcohol use of about 12.0 standard drinks of alcohol per week. She reports that she does not use drugs.   Family History:  Her family history includes Alcohol abuse in her brother; Anxiety disorder in her mother and sister; Breast cancer in her maternal aunt; Colon cancer in her mother; Depression in her mother and sister; Heart failure in her father; Hypertension in her father and mother; Schizophrenia in her brother. There is no history of Heart attack or Stroke.   Allergies Allergies  Allergen Reactions  . Iodine Rash  . Vicodin [Hydrocodone-Acetaminophen] Hives and Rash     Home Medications  Prior to Admission medications   Medication Sig Start Date End Date Taking? Authorizing Provider  aspirin EC 325 MG EC tablet Take 1 tablet (325 mg total) by mouth daily. 04/27/21  Yes Roddenberry, Myron G, PA-C  clonazePAM (KLONOPIN) 0.5 MG tablet TAKE 1 TABLET BY MOUTH TWICE A DAY AS NEEDED Patient taking differently: Take 0.5 mg by mouth 2 (two) times daily. 03/11/21  Yes Burnard Hawthorne,  FNP  escitalopram (LEXAPRO) 10 MG tablet Take 1 tablet (10 mg total) by mouth daily. Patient taking differently: Take 10 mg by mouth in the morning. 11/22/20  Yes Arnett, [name], FNP  lisinopril (ZESTRIL) 20 MG tablet Take 1 tablet (20 mg total) by mouth daily. Patient taking differently: Take 20 mg by mouth in the morning. 11/22/20  Yes Burnard Hawthorne, FNP  Metoprolol Tartrate 75 MG TABS Take 75 mg by mouth 2 (two) times daily. 04/27/21  Yes Roddenberry, Arlis Porta, PA-C  rosuvastatin (CRESTOR) 5 MG tablet Take 1 tablet (5 mg total) by mouth daily. Patient taking differently: Take 5 mg by mouth in the morning. 03/22/21 06/20/21 Yes Marrianne Mood D, PA-C     Critical care time: Dewayne Hatch, MD [id number] 05/03/2021, 12:53 PM Pager: [phone]

## 2021-05-03 NOTE — Progress Notes (Signed)
Patient became extremely agitated and confused attempting to kick staff, pulling at IV lines and taking off NRB mask. Patient went into Afib with heart rates in the 130's-140's. [facility]Link notified, and MD ordered 1 mg IV of Haldol, along with 5 mg metoprolol IV and a STAT ABG. Patient remains in Afib, heart rates now 110-120, still confused and anxious. Will continue to monitor closely.

## 2021-05-03 NOTE — Progress Notes (Signed)
Progress Note  Patient Name: Deborah Koch Date of Encounter: 05/03/2021  [facility] Cardiologist: Ida Rogue, MD   Subjective   Feels better this afternoon.  No chest pain or shortness of breath.  Events of this morning noted with agitation and confusion as well as atrial fibrillation with RVR.  Inpatient Medications    Scheduled Meds: . Chlorhexidine Gluconate Cloth  6 each Topical Daily  . mouth rinse  15 mL Mouth Rinse BID   Continuous Infusions: . sodium chloride 10 mL/hr at 05/03/21 1600  . ceFEPime (MAXIPIME) IV Stopped (05/03/21 1111)  . esmolol 100 mcg/kg/min (05/03/21 1600)   PRN Meds: sodium chloride, ipratropium-albuterol, labetalol, LORazepam, ondansetron (ZOFRAN) IV   Vital Signs    Vitals:   05/03/21 1515 05/03/21 1520 05/03/21 1532 05/03/21 1600  BP: (!) 148/79   (!) 133/93  Pulse: 87   86  Resp: 16   20  Temp:   97.9 F (36.6 C)   TempSrc:      SpO2: 95% 97%  93%  Weight:      Height:        Intake/Output Summary (Last 24 hours) at 05/03/2021 1705 Last data filed at 05/03/2021 1600 Gross per 24 hour  Intake 1565.59 ml  Output 1675 ml  Net -109.41 ml   Last 3 Weights 05/03/2021 05/02/2021 04/27/2021  Weight (lbs) 137 lb 2 oz 131 lb 138 lb 4.8 oz  Weight (kg) 62.2 kg 59.421 kg 62.732 kg  Some encounter information is confidential and restricted. Go to Review Flowsheets activity to see all data.      Telemetry    .  Of atrial fibrillation with RVR this morning noted, reverted to normal sinus rhythm spontaneously at about 10:40 AM today. - Personally Reviewed   Physical Exam  Alert, oriented, no distress GEN: No acute distress.   Neck: No JVD Cardiac: RRR, soft systolic murmur at the left upper sternal border and right upper sternal border Respiratory: Clear to auscultation bilaterally. GI: Soft, nontender, non-distended  MS: No edema; No deformity. Neuro:  Nonfocal  Psych: Normal affect   Labs    High Sensitivity  Troponin:   Recent Labs  Lab 05/02/21 0757 05/02/21 1033 05/02/21 1253 05/02/21 1539  TROPONINIHS 161* 162* 172* 150*      Chemistry Recent Labs  Lab 05/02/21 1539 05/03/21 0127 05/03/21 0649 05/03/21 1315  NA 126* 127* 128* 129*  K 4.9 5.4* 4.0 3.9  CL 88* 90*  --  92*  CO2 26 26  --  24  GLUCOSE 99 110*  --  96  BUN 29* 25*  --  19  CREATININE 1.17* 0.92  --  0.86  CALCIUM 8.6* 8.7*  --  8.7*  GFRNONAA 52* >60  --  >60  ANIONGAP 12 11  --  13     Hematology Recent Labs  Lab 05/02/21 1539 05/02/21 2000 05/03/21 0127 05/03/21 0649  WBC 32.5* 31.4* 30.2*  --   RBC 2.65* 2.73* 2.63*  --   HGB 8.1* 8.4* 8.0* 9.2*  HCT 24.4* 24.4* 24.4* 27.0*  MCV 92.1 89.4 92.8  --   MCH 30.6 30.8 30.4  --   MCHC 33.2 34.4 32.8  --   RDW 15.6* 15.5 15.8*  --   PLT 486* 350 456*  --     BNP Recent Labs  Lab 05/02/21 0757  BNP 823.2*     DDimer No results for input(s): DDIMER in the last 168 hours.   Radiology  DG Chest 2 View  Result Date: 05/02/2021 CLINICAL DATA:  Sob chest pain post op 1 week ago heart surgery EXAM: CHEST - 2 VIEW COMPARISON:  04/24/2021 FINDINGS: Persistent small pleural effusions and bibasilar atelectasis/consolidation left greater than right. Heart size upper limits normal. Previous AVR. Aortic Atherosclerosis (ICD10-170.0). Ectatic thoracic aorta as previously documented. No pneumothorax. Moderate thoracolumbar dextroscoliosis without evident underlying vertebral anomaly. Surgical clips in the epigastrium. IMPRESSION: Persistent small pleural effusions and bibasilar consolidation/atelectasis. Electronically Signed   By: Lucrezia Europe M.D.   On: 05/02/2021 08:03   CT Head Wo Contrast  Result Date: 05/02/2021 CLINICAL DATA:  Mental status changes. EXAM: CT HEAD WITHOUT CONTRAST TECHNIQUE: Contiguous axial images were obtained from the base of the skull through the vertex without intravenous contrast. COMPARISON:  08/19/2017 FINDINGS: Brain: No acute  intracranial abnormality. Specifically, no hemorrhage, hydrocephalus, mass lesion, acute infarction, or significant intracranial injury. Vascular: No hyperdense vessel or unexpected calcification. Skull: No acute calvarial abnormality. Sinuses/Orbits: No acute findings Other: None IMPRESSION: No acute intracranial abnormality. Electronically Signed   By: Rolm Baptise M.D.   On: 05/02/2021 09:42   CT Chest Wo Contrast  Result Date: 05/02/2021 CLINICAL DATA:  Chest pain. EXAM: CT CHEST WITHOUT CONTRAST TECHNIQUE: Multidetector CT imaging of the chest was performed following the standard protocol without IV contrast. COMPARISON:  April 11, 2021. FINDINGS: Cardiovascular: Status post aortic valve repair and surgical repair of ascending thoracic aorta. Moderate size pericardial effusion is noted with some high density material suggesting some degree of hemopericardium. Atherosclerosis of thoracic aorta is noted. Coronary artery calcifications are noted. Mediastinum/Nodes: No enlarged mediastinal or axillary lymph nodes. Thyroid gland, trachea, and esophagus demonstrate no significant findings. Lungs/Pleura: No pneumothorax is noted. Small bilateral pleural effusions are noted with adjacent atelectasis of both lower lobes. Probable loculated pleural effusions are noted involving the left major fissure. Upper Abdomen: No acute abnormality. Musculoskeletal: Moderate to severe dextroscoliosis of the thoracic and lumbar spine is noted. No acute osseous abnormality is noted. IMPRESSION: Status post aortic valve repair and surgical pair of ascending thoracic aorta. Moderate size pericardial effusion is noted with some high density material present suggesting some degree of hemopericardium. CT a of the chest is recommended to rule out aortic extravasation or injury. Critical Value/emergent results were called by telephone at the time of interpretation on 05/02/2021 at 10:24 am to provider [facility] , who verbally  acknowledged these results. Small bilateral pleural effusions are noted with adjacent atelectasis of both lower lobes. Probable loculated pleural effusions are seen involving the left major fissure. Aortic Atherosclerosis (ICD10-I70.0). Electronically Signed   By: Marijo Conception M.D.   On: 05/02/2021 10:24   DG CHEST PORT 1 VIEW  Result Date: 05/03/2021 CLINICAL DATA:  Pleural effusion, shortness of breath, chest pain EXAM: PORTABLE CHEST 1 VIEW COMPARISON:  05/02/2021 FINDINGS: Cardiomegaly. Prior median sternotomy and valve replacement. Layering bilateral effusions with bibasilar atelectasis or infiltrates. Findings are similar to prior study. IMPRESSION: No significant change. Electronically Signed   By: Rolm Baptise M.D.   On: 05/03/2021 10:06   ECHOCARDIOGRAM COMPLETE  Result Date: 05/02/2021    ECHOCARDIOGRAM REPORT   Patient Name:   Deborah Koch Date of Exam: 05/02/2021 Medical Rec #:  [id number]            Height:       62.0 in Accession #:    [id number]           Weight:  131.0 lb Date of Birth:  01/04/1956            BSA:          1.597 m Patient Age:    41 years             BP:           119/82 mmHg Patient Gender: F                    HR:           68 bpm. Exam Location:  Inpatient Procedure: 2D Echo, Cardiac Doppler and Color Doppler Indications:    Aortic valve disorder I35.9                 Pericardial effusion I31.3  History:        Patient has prior history of Echocardiogram examinations, most                 recent 04/21/2021. Aortic Valve Disease.  Sonographer:    Merrie Roof RDCS Referring Phys: [id number] [place]  1. Left ventricular ejection fraction, by estimation, is 65 to 70%. The left ventricle has normal function. The left ventricle has no regional wall motion abnormalities. Left ventricular diastolic parameters are consistent with Grade I diastolic dysfunction (impaired relaxation).  2. Right ventricular systolic function is normal. The right ventricular  size is normal.  3. Left atrial size was severely dilated.  4. Right atrial size was mildly dilated.  5. Moderate pericardial effusion surrounds heart with some consolidation (? clot) Except for dilated IVC, effusion does not appear to be hemodynamically compromising by echo criteria.. Moderate pericardial effusion.  6. Mild mitral valve regurgitation.  7. TR is eccentric, directed toward interatrial septum.. Tricuspid valve regurgitation is moderate.  8. S/p AVR with 23 mm Edwards pericaridal tissue valve (04/21/21) Valve appears well seated. Peak and mean gradients through the valve are 29 adn 12 mm Hg respectively Dimensionless index is 0.54.[name] The aortic valve has been repaired/replaced. Aortic valve  regurgitation is not visualized.  9. The inferior vena cava is dilated in size with <50% respiratory variability, suggesting right atrial pressure of 15 mmHg. FINDINGS  Left Ventricle: Left ventricular ejection fraction, by estimation, is 65 to 70%. The left ventricle has normal function. The left ventricle has no regional wall motion abnormalities. The left ventricular internal cavity size was normal in size. There is  no left ventricular hypertrophy. Left ventricular diastolic parameters are consistent with Grade I diastolic dysfunction (impaired relaxation). Right Ventricle: The right ventricular size is normal. Right vetricular wall thickness was not assessed. Right ventricular systolic function is normal. Left Atrium: Left atrial size was severely dilated. Right Atrium: Right atrial size was mildly dilated. Pericardium: Moderate pericardial effusion surrounds heart with some consolidation (? clot) Except for dilated IVC, effusion does not appear to be hemodynamically compromising by echo criteria. A moderately sized pericardial effusion is present. Mitral Valve: The mitral valve is normal in structure. Mild mitral valve regurgitation. Tricuspid Valve: TR is eccentric, directed toward interatrial septum. The  tricuspid valve is normal in structure. Tricuspid valve regurgitation is moderate. Aortic Valve: S/p AVR with 23 mm Edwards pericaridal tissue valve (04/21/21) Valve appears well seated. Peak and mean gradients through the valve are 29 adn 12 mm Hg respectively Dimensionless index is 0.54. The aortic valve has been repaired/replaced. Aortic valve regurgitation is not visualized. Aortic valve mean gradient measures 12.0 mmHg. Aortic valve peak gradient measures 29.2  mmHg. Aortic valve area, by VTI measures 1.39 cm. Pulmonic Valve: The pulmonic valve was normal in structure. Pulmonic valve regurgitation is not visualized. Aorta: S/p [place] graft. The aortic root is normal in size and structure. Venous: The inferior vena cava is dilated in size with less than 50% respiratory variability, suggesting right atrial pressure of 15 mmHg. IAS/Shunts: The interatrial septum was not assessed.  LEFT VENTRICLE PLAX 2D LVIDd:         4.99 cm  Diastology LVIDs:         3.52 cm  LV e' medial:    7.29 cm/s LV PW:         1.11 cm  LV E/e' medial:  11.4 LV IVS:        1.00 cm  LV e' lateral:   8.59 cm/s LVOT diam:     1.80 cm  LV E/e' lateral: 9.7 LV SV:         65 LV SV Index:   41 LVOT Area:     2.54 cm  RIGHT VENTRICLE          IVC RV Basal diam:  3.80 cm  IVC diam: 2.55 cm LEFT ATRIUM              Index LA diam:        3.90 cm  2.44 cm/m LA Vol (A2C):   102.0 ml 63.87 ml/m LA Vol (A4C):   89.0 ml  55.73 ml/m LA Biplane Vol: 105.0 ml 65.75 ml/m  AORTIC VALVE AV Area (Vmax):    1.16 cm AV Area (Vmean):   1.18 cm AV Area (VTI):     1.39 cm AV Vmax:           270.00 cm/s AV Vmean:          156.000 cm/s AV VTI:            0.470 m AV Peak Grad:      29.2 mmHg AV Mean Grad:      12.0 mmHg LVOT Vmax:         123.00 cm/s LVOT Vmean:        72.300 cm/s LVOT VTI:          0.256 m LVOT/AV VTI ratio: 0.54  AORTA Ao Root diam: 2.70 cm MITRAL VALVE                TRICUSPID VALVE MV Area (PHT): 2.60 cm     TR Peak grad:   21.5 mmHg MV  Decel Time: 292 msec     TR Vmax:        232.00 cm/s MV E velocity: 82.90 cm/s MV A velocity: 101.00 cm/s  SHUNTS MV E/A ratio:  0.82         Systemic VTI:  0.26 m                             Systemic Diam: 1.80 cm Dorris Carnes MD Electronically signed by Dorris Carnes MD Signature Date/Time: 05/02/2021/6:36:24 PM    Final    CT Angio Chest/Abd/Pel for Dissection W and/or W/WO  Result Date: 05/02/2021 CLINICAL DATA:  Chest pain after aortic valve repair. EXAM: CT ANGIOGRAPHY CHEST, ABDOMEN AND PELVIS TECHNIQUE: Non-contrast CT of the chest was initially obtained. Multidetector CT imaging through the chest, abdomen and pelvis was performed using the standard protocol during bolus administration of intravenous contrast. Multiplanar reconstructed images and MIPs were obtained and reviewed  to evaluate the vascular anatomy. CONTRAST:  11mL OMNIPAQUE IOHEXOL 350 MG/ML SOLN COMPARISON:  Same day.  April 11, 2021. FINDINGS: CTA CHEST FINDINGS Cardiovascular: Status post aortic valve repair and surgical repair of ascending thoracic aortic aneurysm. There is noted moderate pericardial fluid with high density within it at an average Hounsfield measurement of 43 along the left cardiac border, concerning for hemopericardium. Coronary artery calcifications are noted. There is no definite dissection is seen involving the thoracic aorta. Atherosclerosis is noted involving the transverse aortic arch and descending thoracic aorta. There is noted an outpouching of contrast measuring 10 x 9 mm arising from the transverse aortic arch inferiorly into the left with adjacent calcification or other high-density material, best seen on image number 77 of series 9. This may represent a surgical access point or graft outpouching, but small contained pseudoaneurysm cannot be excluded. No definite active extravasation into the pericardium is noted. Mediastinum/Nodes: No enlarged mediastinal, hilar, or axillary lymph nodes. Thyroid gland, trachea,  and esophagus demonstrate no significant findings. Lungs/Pleura: No pneumothorax is noted. Small to moderate size right pleural effusion is noted with associated atelectasis of the right upper and lower lobes. Small loculated effusions are seen involving the superior and lower aspects of the left major fissure. Mild left basilar subsegmental atelectasis is noted. Musculoskeletal: No chest wall abnormality. No acute or significant osseous findings. Review of the MIP images confirms the above findings. CTA ABDOMEN AND PELVIS FINDINGS VASCULAR Aorta: Atherosclerosis of thoracic aorta is noted without aneurysm or dissection. Celiac: Patent without evidence of aneurysm, dissection, vasculitis or significant stenosis. SMA: Patent without evidence of aneurysm, dissection, vasculitis or significant stenosis. Renals: Both renal arteries are patent without evidence of aneurysm, dissection, vasculitis, fibromuscular dysplasia or significant stenosis. IMA: Patent without evidence of aneurysm, dissection, vasculitis or significant stenosis. Inflow: Patent without evidence of aneurysm, dissection, vasculitis or significant stenosis. Veins: No obvious venous abnormality within the limitations of this arterial phase study. Review of the MIP images confirms the above findings. NON-VASCULAR Hepatobiliary: No focal liver abnormality is seen. No gallstones, gallbladder wall thickening, or biliary dilatation. Pancreas: Unremarkable. No pancreatic ductal dilatation or surrounding inflammatory changes. Spleen: There is interval development of a moderate amount of high density material around the spleen with an average Hounsfield measurement of 56, concerning for possible perisplenic hematoma. Adrenals/Urinary Tract: Adrenal glands are unremarkable. Kidneys are normal, without renal calculi, focal lesion, or hydronephrosis. Bladder is unremarkable. Stomach/Bowel: Stomach is within normal limits. Appendix appears normal. No evidence of  bowel wall thickening, distention, or inflammatory changes. Sigmoid diverticulosis is noted without inflammation. Lymphatic: No significant adenopathy is noted. Reproductive: Uterus and bilateral adnexa are unremarkable. Other: No hernia is noted. Musculoskeletal: No acute or significant osseous findings. Review of the MIP images confirms the above findings. IMPRESSION: Status post recent aortic valve replacement and surgical repair of ascending thoracic aortic aneurysm. Moderate size pericardial effusion is noted which contains high density material and is concerning for some degree of hemopericardium as noted on previous exam of same day. There does not appear to be any evidence of thoracic aortic dissection. 10 x 9 mm outpouching of contrast is seen arising from the transverse aortic arch inferiorly and to the left which may represent a surgical access point or portion of the aortic graft, but focal pseudoaneurysm cannot be excluded. No definite active extravasation into other portions of the pericardium is noted. Correlation with the intraoperative details of the patient's recent surgery is recommended. Also noted is moderate sized complex material  around the spleen concerning for possible perisplenic hematoma of unknown etiology. Small to moderate size right pleural effusion is noted with adjacent atelectasis of the right upper and lower lobes. Small loculated effusions are seen involving the left major fissure with mild atelectasis in the left lower lobe. Sigmoid diverticulosis without inflammation. Aortic Atherosclerosis (ICD10-I70.0). Electronically Signed   By: Marijo Conception M.D.   On: 05/02/2021 13:02    Patient Profile     65 y.o. female with bicuspid aortic stenosis status post recent bioprosthetic aortic valve replacement and aortic root replacement presenting with leukocytosis, shortness of breath, moderate pericardial effusion, and acute respiratory failure with hypoxemia  Assessment & Plan     1.  Possible sepsis: Per CCM team.  1 of 4 blood cultures as come back positive, possibly contaminant, continued on broad-spectrum antibiotics at this time. 2.  Pericardial effusion: Moderate effusion, conservative management recommended in setting of no tachycardia, hypotension, or apparent hemodynamic sequelae.  Yesterday's echo results are reviewed. 3.  Acute respiratory failure with hypoxemia: Patient with pleural effusion, considering thoracentesis per CCM and T CTS teams. 4.  Paroxysmal atrial fibrillation: Occurs in setting of problems outlined above.  Back in normal sinus rhythm on an esmolol drip at present.  Would avoid anticoagulation with possible hemorrhagic pericardial effusion.  Overall patient appears stable this afternoon, her mental status is significantly improved, she is back in normal sinus rhythm.  No new orders written at this time.     For questions or updates, please contact [facility] Please consult [email or web address] for contact info under        Signed, Sherren Mocha, MD  05/03/2021, 5:05 PM

## 2021-05-03 NOTE — Progress Notes (Signed)
PHARMACY - PHYSICIAN COMMUNICATION CRITICAL VALUE ALERT - BLOOD CULTURE IDENTIFICATION (BCID)  Katarzyna Wolven is an 65 y.o. female who presented to [facility] on 05/02/2021 .  Assessment: 1/4 GPC in clusters BCID staph epi, no resistance detected. Possible contaminant.  Name of physician (or Provider) Contacted: Dr. Myrtie Hawk  Current antibiotics: Cefepime  Changes to prescribed antibiotics recommended: No changes needed  No results found for this or any previous visit.  Cristela Felt, PharmD Clinical Pharmacist  05/03/2021  2:13 PM

## 2021-05-03 NOTE — Progress Notes (Signed)
PHARMACY NOTE:  ANTIMICROBIAL RENAL DOSAGE ADJUSTMENT  Current antimicrobial regimen includes a mismatch between antimicrobial dosage and estimated renal function.  As per policy approved by the Pharmacy & Therapeutics and Medical Executive Committees, the antimicrobial dosage will be adjusted accordingly.  Current antimicrobial dosage:  Cefepime 1g IV q12h  Indication: sepsis  Renal Function:  Estimated Creatinine Clearance: 52.8 mL/min (by C-G formula based on SCr of 0.92 mg/dL).    Antimicrobial dosage has been changed to:  Cefepime 2g IV q12h  Additional comments:   Thank you for allowing pharmacy to be a part of this patient's care.  Cristela Felt, PharmD Clinical Pharmacist  05/03/2021 2:02 PM

## 2021-05-04 ENCOUNTER — Inpatient Hospital Stay (HOSPITAL_COMMUNITY): Payer: Medicare Other

## 2021-05-04 DIAGNOSIS — R410 Disorientation, unspecified: Secondary | ICD-10-CM | POA: Diagnosis not present

## 2021-05-04 DIAGNOSIS — J90 Pleural effusion, not elsewhere classified: Secondary | ICD-10-CM | POA: Diagnosis not present

## 2021-05-04 DIAGNOSIS — A419 Sepsis, unspecified organism: Principal | ICD-10-CM

## 2021-05-04 DIAGNOSIS — I161 Hypertensive emergency: Secondary | ICD-10-CM | POA: Diagnosis not present

## 2021-05-04 DIAGNOSIS — I4891 Unspecified atrial fibrillation: Secondary | ICD-10-CM

## 2021-05-04 LAB — CBC
HCT: 24 % — ABNORMAL LOW (ref 36.0–46.0)
HCT: 30.1 % — ABNORMAL LOW (ref 36.0–46.0)
Hemoglobin: 7.8 g/dL — ABNORMAL LOW (ref 12.0–15.0)
Hemoglobin: 9.7 g/dL — ABNORMAL LOW (ref 12.0–15.0)
MCH: 30 pg (ref 26.0–34.0)
MCH: 30.2 pg (ref 26.0–34.0)
MCHC: 32.2 g/dL (ref 30.0–36.0)
MCHC: 32.5 g/dL (ref 30.0–36.0)
MCV: 93 fL (ref 80.0–100.0)
MCV: 93.2 fL (ref 80.0–100.0)
Platelets: 475 10*3/uL — ABNORMAL HIGH (ref 150–400)
Platelets: 554 10*3/uL — ABNORMAL HIGH (ref 150–400)
RBC: 2.58 MIL/uL — ABNORMAL LOW (ref 3.87–5.11)
RBC: 3.23 MIL/uL — ABNORMAL LOW (ref 3.87–5.11)
RDW: 16 % — ABNORMAL HIGH (ref 11.5–15.5)
RDW: 16.2 % — ABNORMAL HIGH (ref 11.5–15.5)
WBC: 21.9 10*3/uL — ABNORMAL HIGH (ref 4.0–10.5)
WBC: 22.4 10*3/uL — ABNORMAL HIGH (ref 4.0–10.5)
nRBC: 1 % — ABNORMAL HIGH (ref 0.0–0.2)
nRBC: 1.1 % — ABNORMAL HIGH (ref 0.0–0.2)

## 2021-05-04 LAB — BASIC METABOLIC PANEL WITH GFR
Anion gap: 9 (ref 5–15)
BUN: 19 mg/dL (ref 8–23)
CO2: 28 mmol/L (ref 22–32)
Calcium: 8.5 mg/dL — ABNORMAL LOW (ref 8.9–10.3)
Chloride: 95 mmol/L — ABNORMAL LOW (ref 98–111)
Creatinine, Ser: 0.94 mg/dL (ref 0.44–1.00)
GFR, Estimated: >60 mL/min
Glucose, Bld: 96 mg/dL (ref 70–99)
Potassium: 4.2 mmol/L (ref 3.5–5.1)
Sodium: 132 mmol/L — ABNORMAL LOW (ref 135–145)

## 2021-05-04 LAB — GLUCOSE, CAPILLARY
Glucose-Capillary: 100 mg/dL — ABNORMAL HIGH (ref 70–99)
Glucose-Capillary: 88 mg/dL (ref 70–99)
Glucose-Capillary: 83 mg/dL (ref 70–99)
Glucose-Capillary: 138 mg/dL — ABNORMAL HIGH (ref 70–99)
Glucose-Capillary: 168 mg/dL — ABNORMAL HIGH (ref 70–99)
Glucose-Capillary: 169 mg/dL — ABNORMAL HIGH (ref 70–99)

## 2021-05-04 LAB — ECHOCARDIOGRAM LIMITED
AV Mean grad: 7 mmHg
AV Peak grad: 16.3 mmHg
Ao pk vel: 2.02 m/s
Height: 62 in
Weight: 2172.85 oz

## 2021-05-04 LAB — PROCALCITONIN: Procalcitonin: 0.61 ng/mL

## 2021-05-04 LAB — MAGNESIUM: Magnesium: 2 mg/dL (ref 1.7–2.4)

## 2021-05-04 LAB — PHOSPHORUS: Phosphorus: 2.2 mg/dL — ABNORMAL LOW (ref 2.5–4.6)

## 2021-05-04 MED ORDER — HALOPERIDOL LACTATE 5 MG/ML IJ SOLN: 1.0000 mg | Freq: Four times a day (QID) | INTRAMUSCULAR | Status: DC | PRN

## 2021-05-04 MED ORDER — HALOPERIDOL LACTATE 5 MG/ML IJ SOLN
1.0000 mg | Freq: Once | INTRAMUSCULAR | Status: AC
  Administered 2021-05-04: 1 mg via INTRAVENOUS
  Filled 2021-05-04: qty 1

## 2021-05-04 MED ORDER — AMIODARONE HCL IN DEXTROSE 360-4.14 MG/200ML-% IV SOLN
60.0000 mg/h | INTRAVENOUS | Status: AC
  Administered 2021-05-04 (×3): 60 mg/h via INTRAVENOUS
  Filled 2021-05-04 (×2): qty 200

## 2021-05-04 MED ORDER — ESCITALOPRAM OXALATE 10 MG PO TABS
10.0000 mg | ORAL_TABLET | Freq: Every day | ORAL | Status: DC
  Administered 2021-05-04 – 2021-05-08 (×5): 10 mg via ORAL
  Filled 2021-05-04 (×5): qty 1

## 2021-05-04 MED ORDER — METOPROLOL TARTRATE 50 MG PO TABS
75.0000 mg | ORAL_TABLET | Freq: Two times a day (BID) | ORAL | Status: DC
  Administered 2021-05-04 (×2): 75 mg via ORAL
  Filled 2021-05-04: qty 1.5
  Filled 2021-05-04: qty 3
  Filled 2021-05-04: qty 1.5
  Filled 2021-05-04: qty 3
  Filled 2021-05-04: qty 1.5
  Filled 2021-05-04: qty 3

## 2021-05-04 MED ORDER — DILTIAZEM HCL 25 MG/5ML IV SOLN
10.0000 mg | Freq: Once | INTRAVENOUS | Status: AC
  Administered 2021-05-04: 10 mg via INTRAVENOUS
  Filled 2021-05-04: qty 5

## 2021-05-04 MED ORDER — DILTIAZEM HCL 25 MG/5ML IV SOLN
5.0000 mg | Freq: Once | INTRAVENOUS | Status: AC
  Administered 2021-05-04: 5 mg via INTRAVENOUS
  Filled 2021-05-04: qty 5

## 2021-05-04 MED ORDER — LORAZEPAM 1 MG PO TABS
1.0000 mg | ORAL_TABLET | ORAL | Status: DC | PRN
  Administered 2021-05-04: 2 mg via ORAL
  Filled 2021-05-04: qty 2

## 2021-05-04 MED ORDER — AMIODARONE LOAD VIA INFUSION
150.0000 mg | Freq: Once | INTRAVENOUS | Status: AC
  Administered 2021-05-04: 150 mg via INTRAVENOUS
  Filled 2021-05-04: qty 83.34

## 2021-05-04 MED ORDER — POLYETHYLENE GLYCOL 3350 17 G PO PACK
17.0000 g | PACK | Freq: Every day | ORAL | Status: DC | PRN
  Administered 2021-05-06: 17 g via ORAL
  Filled 2021-05-04: qty 1

## 2021-05-04 MED ORDER — AMIODARONE HCL IN DEXTROSE 360-4.14 MG/200ML-% IV SOLN
30.0000 mg/h | INTRAVENOUS | Status: DC
  Administered 2021-05-04 – 2021-05-05 (×2): 30 mg/h via INTRAVENOUS
  Filled 2021-05-04: qty 200

## 2021-05-04 MED ORDER — LORAZEPAM 2 MG/ML IJ SOLN: 1.0000 mg | INTRAMUSCULAR | Status: DC | PRN

## 2021-05-04 MED ORDER — TRAZODONE HCL 50 MG PO TABS
50.0000 mg | ORAL_TABLET | Freq: Every day | ORAL | Status: DC
  Administered 2021-05-04 – 2021-05-07 (×4): 50 mg via ORAL
  Filled 2021-05-04 (×4): qty 1

## 2021-05-04 MED ORDER — DIGOXIN 0.25 MG/ML IJ SOLN
0.2500 mg | Freq: Once | INTRAMUSCULAR | Status: AC
  Administered 2021-05-04: 0.25 mg via INTRAVENOUS
  Filled 2021-05-04: qty 2

## 2021-05-04 MED ORDER — FUROSEMIDE 10 MG/ML IJ SOLN
40.0000 mg | Freq: Once | INTRAMUSCULAR | Status: AC
  Administered 2021-05-04: 40 mg via INTRAVENOUS
  Filled 2021-05-04: qty 4

## 2021-05-04 MED ORDER — CLONAZEPAM 0.5 MG PO TABS
0.5000 mg | ORAL_TABLET | Freq: Two times a day (BID) | ORAL | Status: DC | PRN
  Administered 2021-05-04 – 2021-05-05 (×2): 0.5 mg via ORAL
  Filled 2021-05-04 (×2): qty 1

## 2021-05-04 NOTE — Progress Notes (Signed)
After giving the ordered Cardizem 10 mg IV, patient remains in Afib with heart rates in the 120's-130's. Notified [facility]. Will continue to monitor closely.

## 2021-05-04 NOTE — Progress Notes (Signed)
  Echocardiogram 2D Echocardiogram limited with color and doppler has been performed.  Darlina Sicilian M 05/04/2021, 7:57 AM

## 2021-05-04 NOTE — Progress Notes (Addendum)
NAME:  Deborah Koch, MRN:  [id number], DOB:  09/06/1956, LOS: 2 ADMISSION DATE:  05/02/2021, CONSULTATION DATE:   05/02/21 REFERRING MD:  Colvin Caroli- ED, CHIEF COMPLAINT:  Hypoxia, SOB   History of Present Illness:  Deborah Koch is a 65 y/o woman with a history of bicuspid aortic valve s/p SAVR and aortic aneurysm repair on 04/21/21 who was discharged 5/11 who presents with complaints of pressure like chest pain and worsening SOB today after several days of feeling week. She had been feeling well prior to discharge, but her sister brought her to the ED for evaluation of her progressive symptoms of weakness, fatigue. She has scoliosis that causes chronic pain in her upper back, which is currently at baseline without new CP or back pain. No LE symptoms. She has not taken her antihypertensives at home today.  CT scan performed at [facility] demonstrated bilateral pleural effusions, loculated on the left, and pericardial effusion. TCTS has been consulted for evaluation and recommended a CTA, echo, and BP control. PCCM consulted for admission.  Pertinent  Medical History  Bicuspid aortic valve s/p SAVR and aortic aneurysm repair 04/21/21, discharged 5/11 Scoliosis GERD C diff (2015) Anxiety and depression Significant Hospital Events: Including procedures, antibiotic start and stop dates in addition to other pertinent events    5/16 admission  Interim History / Subjective:  Intermittently agitated. Denies any pain or shortness of breath. Overnight, she went to Afib w RVR and received IV Diltiazem and one dose of Digoxin.  Objective   Blood pressure (!) 128/98, pulse (!) 118, temperature 97.8 F (36.6 C), temperature source Oral, resp. rate 16, height 5\' 2"  (1.575 m), weight 61.6 kg, SpO2 95 %.    FiO2 (%):  [50 %] 50 %   Intake/Output Summary (Last 24 hours) at 05/04/2021 0727 Last data filed at 05/04/2021 0700 Gross per 24 hour  Intake 856.98 ml  Output 1300 ml  Net -443.02 ml   Filed  Weights   05/02/21 0700 05/03/21 0500 05/04/21 0500  Weight: 59.4 kg 62.2 kg 61.6 kg    Examination: General: Resting comfortably. But becomes agitated and combative on reevaluation Lungs: Anterior lung exam is with some decreased breath sound on rt side. Clear to auscultation  Chest: Surgical incision site is clear and well healing Cardiovascular: Irregular rhythm. No murmur Abdomen: Soft, not distended, non tender to palpation Extremities: Warm. Well perfused. No edema Neuro: Resting comfortably, initially calm and then becomes agitated. Oriented x 3 GU: PureWick in place  Labs/imaging that I havepersonally reviewed  (right click and "Reselect all SmartList Selections" daily)  Hb <7.8< 9.2<8 , wbc 30 K 4.2< 5.4 Na 132<127 BC pending  Resolved Hospital Problem list   Na Assessment & Plan:  Deborah Koch is a 65 y.o. female with hx of congential bicuspid aortic valve with development of subsequent severe aortic stenosis and poststenotic aortic dilatation, s/p recent (5-01/2021) bioprosthetic AVR/aortic root replacement who presents 5/16 with CP and acute dyspnea, concern for sepsis. Started on Vanc and Cefepime. BP was elevated and she has been on IV CCB  SIRS: WBC 22.4 today. Was 30000< 33000 on arrival LA <1.7<2.6<2.2<2.4<2.7. But no source of infection. UA 5/16: Moderate Hb, 6-10 RBC, few bacteria, negative nitrate, Pr 100. UC not ordered. Procal <0.6< 0.76<0.67:1 of 4 BC bottle is positive. likely contamination but will continue cefepime  -CBC daily -Continue Cefepime  Acute respiratory failure with hypoxemia: Maybe 2/2 rt pleural effusion/rt lower lobe collapse. Improved. Now on 4 l HFNC.  Rt pleural effusion: CXR and CT 5/16: Small to moderated rt side pleural effusion and small loculated effusions are seen involving the left major fissure with mild atelectasis in the left lower lobe.   Pleural effusions is probably related to previous surgery. IR consulted for R thora  given recent chest tubes and chest instrumentation. CT head 5/16 unremarkable CT chest w pericardial effusion, likley hemopericardium.  BNP on arrival levated at 823 Trop 150<172<162<161  -Now on 4 li HFNC -Repeat CXR. Shows stable pleural effusion.  -Repeat IV Lasix 40 mg -No plan for thora today but will consider if no improvement -Starting diet  Pericardial effusion: (Possible hemorrhagic): Likely 2/2 recent AVR surgery. No sign of Tamponade on arrival. And decided to manage conservatively. However she had drop in Hb and tachycardia (thought to be Afib w RVR though). Echo ordered overnight. Will assess for any worsening of hemopericardium. -F/u repeat echo>>ADDENDUM: repeat echo w/o tamponade  Anemia: Drop in Hb today. As above. -Repeat CBC this afternoon  Possible perisplenic hematoma: Unclear etiology. Will monitor. If Hb continue to drop this AM and no worsening of hemoperitoneum, may consider re imaging of abdomen if clinically relevant  Afib w RVR: Initially on IV Cleviprex. Switched to Esmolol 5/18. Received IV Diltiazem one dose of Dig overnight.  -On Esmolol IV -Borderline RVR. Appreciate cardiology recommendations  Agitated delirium: Had some combativeness this AM. -PRN Haldol -Resuming home Lexapro and clonazepam prn -Trazodon 50 mg QHS  Hyponatremia: Improved w Lasxi Likely 2/2 hypervolemia. Na 132 128<127<126 on arrival.   AKI: Resolved Cr on arrival: Cr 1.31 on 5/16 from 0.78 on 04/25/21.  -Ct today 0.9 -BMP daily   Best practice (right click and "Reselect all SmartList Selections" daily)  Diet:  Oral Pain/Anxiety/Delirium protocol (if indicated): No VAP protocol (if indicated): Not indicated DVT prophylaxis: SCD and Contraindicated GI prophylaxis: N/A Glucose control:   Central venous access:  Arterial line:  N/A Foley:  N/A Mobility:  bed rest  PT consulted: N/A Last date of multidisciplinary goals of care discussion []  Code Status:  full  code Disposition: Stays in ICU  Labs   CBC: Recent Labs  Lab 05/02/21 0757 05/02/21 1539 05/02/21 2000 05/03/21 0127 05/03/21 0649 05/04/21 0013  WBC 32.9* 32.5* 31.4* 30.2*  --  22.4*  NEUTROABS 29.5*  --   --   --   --   --   HGB 8.7* 8.1* 8.4* 8.0* 9.2* 7.8*  HCT 26.7* 24.4* 24.4* 24.4* 27.0* 24.0*  MCV 92.7 92.1 89.4 92.8  --  93.0  PLT 527* 486* 350 456*  --  475*    Basic Metabolic Panel: Recent Labs  Lab 05/02/21 0757 05/02/21 1253 05/02/21 1539 05/03/21 0127 05/03/21 0649 05/03/21 1315 05/04/21 0013  NA 124* 124* 126* 127* 128* 129* 132*  K 5.2* 4.7 4.9 5.4* 4.0 3.9 4.2  CL 85* 86* 88* 90*  --  92* 95*  CO2 26 27 26 26   --  24 28  GLUCOSE 103* 92 99 110*  --  96 96  BUN 31* 30* 29* 25*  --  19 19  CREATININE 1.31* 1.13* 1.17* 0.92  --  0.86 0.94  CALCIUM 8.7* 8.6* 8.6* 8.7*  --  8.7* 8.5*  MG 2.5*  --   --   --   --   --   --    GFR: Estimated Creatinine Clearance: 51.5 mL/min (by C-G formula based on SCr of 0.94 mg/dL). Recent Labs  Lab 05/02/21 1253 05/02/21 1347  05/02/21 1539 05/02/21 2000 05/02/21 2050 05/03/21 0127 05/03/21 0901 05/03/21 1315 05/04/21 0013  PROCALCITON 0.67  --   --   --   --  0.76  --   --  0.61  WBC  --   --  32.5* 31.4*  --  30.2*  --   --  22.4*  LATICACIDVEN  --    < > 2.6*  --  2.8*  --  2.2* 1.7  --    < > = values in this interval not displayed.    Liver Function Tests: No results for input(s): AST, ALT, ALKPHOS, BILITOT, PROT, ALBUMIN in the last 168 hours. No results for input(s): LIPASE, AMYLASE in the last 168 hours. No results for input(s): AMMONIA in the last 168 hours.  ABG    Component Value Date/Time   PHART 7.458 (H) 05/03/2021 0649   PCO2ART 36.3 05/03/2021 0649   PO2ART 63 (L) 05/03/2021 0649   HCO3 25.7 05/03/2021 0649   TCO2 27 05/03/2021 0649   ACIDBASEDEF 3.0 (H) 04/22/2021 0446   O2SAT 93.0 05/03/2021 0649     Coagulation Profile: Recent Labs  Lab 05/02/21 1253 05/03/21 0127  INR  1.6* 1.4*    Cardiac Enzymes: No results for input(s): CKTOTAL, CKMB, CKMBINDEX, TROPONINI in the last 168 hours.  HbA1C: Hgb A1c MFr Bld  Date/Time Value Ref Range Status  04/19/2021 02:40 PM 5.2 4.8 - 5.6 % Final    Comment:    (NOTE) Pre diabetes:          5.7%-6.4%  Diabetes:              >6.4%  Glycemic control for   <7.0% adults with diabetes   05/05/2020 11:57 AM 5.6 4.6 - 6.5 % Final    Comment:    Glycemic Control Guidelines for People with Diabetes:Non Diabetic:  <6%Goal of Therapy: <7%Additional Action Suggested:  >8%     CBG: Recent Labs  Lab 05/03/21 1132 05/03/21 1529 05/03/21 1913 05/03/21 2334 05/04/21 0318  GLUCAP 92 97 87 87 100*    Review of Systems:   DOE, weakness  Past Medical History:  She,  has a past medical history of Anxiety, C. difficile colitis, Congenital bicuspid aortic valve, Depression, Dilated Ascending Aorta, Diverticulitis, GERD (gastroesophageal reflux disease), H/O mastitis (2015), Heart murmur, History of tobacco abuse, HTN (hypertension), Hypokalemia, Moderate to severe aortic stenosis, Multilevel degenerative disc disease, Pneumonia, and Scoliosis.   Surgical History:   Past Surgical History:  Procedure Laterality Date  . AORTA -INNOMIATE BYPASS N/A 04/21/2021   Procedure: AORTA -[place] BYPASS;  Surgeon: Gaye Pollack, MD;  Location: [facility] OR;  Service: Vascular;  Laterality: N/A;  . AORTIC VALVE REPLACEMENT N/A 04/21/2021   Procedure: AORTIC VALVE REPLACEMENT (AVR) ON PUMP WITH 23MM INSPIRIS AORTIC VALVE;  Surgeon: Gaye Pollack, MD;  Location: [place];  Service: Open Heart Surgery;  Laterality: N/A;  . BARTHOLIN GLAND CYST EXCISION    . BREAST BIOPSY Left    neg  . CAROTID-SUBCLAVIAN BYPASS GRAFT Left 04/21/2021   Procedure: AORTA LEFT COMMON CAROTID BYPASS;  Surgeon: Gaye Pollack, MD;  Location: [place] OR;  Service: Vascular;  Laterality: Left;  . COLONOSCOPY  2013   Dr. Allen Norris  . COLONOSCOPY WITH PROPOFOL N/A 10/23/2017    Procedure: COLONOSCOPY WITH PROPOFOL;  Surgeon: Lucilla Lame, MD;  Location: [facility] END[facility]COPY;  Service: Endoscopy;  Laterality: N/A;  . OOPHORECTOMY  2016  . OVARY SURGERY  2016   cyst on right ovary  .  REPLACEMENT ASCENDING AORTA N/A 04/21/2021   Procedure: REPLACEMENT ASCENDING & ARCH AORTA WITH HEMASHIELD PLATINUM GRAFT 28 [id number];  Surgeon: Gaye Pollack, MD;  Location: [place];  Service: Open Heart Surgery;  Laterality: N/A;  CIRC ARREST  RIGHT AXILLARY ARTERY CANNULATION  . RIGHT/LEFT HEART CATH AND CORONARY ANGIOGRAPHY N/A 03/24/2021   Procedure: RIGHT/LEFT HEART CATH AND CORONARY ANGIOGRAPHY;  Surgeon: Minna Merritts, MD;  Location: [place] [facility];  Service: Cardiovascular;  Laterality: N/A;  . SALPINGECTOMY Bilateral   . TEE WITHOUT CARDIOVERSION N/A 04/21/2021   Procedure: TRANSESOPHAGEAL ECHOCARDIOGRAM (TEE);  Surgeon: Gaye Pollack, MD;  Location: [place];  Service: Open Heart Surgery;  Laterality: N/A;     Social History:   reports that she has been smoking cigarettes. She has a 5.00 pack-year smoking history. She has never used smokeless tobacco. She reports current alcohol use of about 12.0 standard drinks of alcohol per week. She reports that she does not use drugs.   Family History:  Her family history includes Alcohol abuse in her brother; Anxiety disorder in her mother and sister; Breast cancer in her maternal aunt; Colon cancer in her mother; Depression in her mother and sister; Heart failure in her father; Hypertension in her father and mother; Schizophrenia in her brother. There is no history of Heart attack or Stroke.   Allergies Allergies  Allergen Reactions  . Iodine Rash  . Vicodin [Hydrocodone-Acetaminophen] Hives and Rash     Home Medications  Prior to Admission medications   Medication Sig Start Date End Date Taking? Authorizing Provider  aspirin EC 325 MG EC tablet Take 1 tablet (325 mg total) by mouth daily. 04/27/21  Yes Roddenberry, Myron G, PA-C   clonazePAM (KLONOPIN) 0.5 MG tablet TAKE 1 TABLET BY MOUTH TWICE A DAY AS NEEDED Patient taking differently: Take 0.5 mg by mouth 2 (two) times daily. 03/11/21  Yes Burnard Hawthorne, FNP  escitalopram (LEXAPRO) 10 MG tablet Take 1 tablet (10 mg total) by mouth daily. Patient taking differently: Take 10 mg by mouth in the morning. 11/22/20  Yes Arnett, [name], FNP  lisinopril (ZESTRIL) 20 MG tablet Take 1 tablet (20 mg total) by mouth daily. Patient taking differently: Take 20 mg by mouth in the morning. 11/22/20  Yes Burnard Hawthorne, FNP  Metoprolol Tartrate 75 MG TABS Take 75 mg by mouth 2 (two) times daily. 04/27/21  Yes Roddenberry, Arlis Porta, PA-C  rosuvastatin (CRESTOR) 5 MG tablet Take 1 tablet (5 mg total) by mouth daily. Patient taking differently: Take 5 mg by mouth in the morning. 03/22/21 06/20/21 Yes Marrianne Mood D, PA-C     Critical care time: Dewayne Hatch, MD [id number] 05/04/2021, 7:27 AM Pager: [phone]

## 2021-05-04 NOTE — Progress Notes (Addendum)
Progress Note  Patient Name: Janasia Coverdale Date of Encounter: 05/04/2021  [facility] Cardiologist: Ida Rogue, MD   Subjective   Patient is very confused. Yelling at different names. She is pleasant but agitated. She is not redirectable, but able to follow one step command. She knows her name, unaware her current place and situation. She denied any chest pain, SOB, abdominal pain. She states she feels her heart rate is racing and denied any dizziness. She states she is hungry.    Inpatient Medications    Scheduled Meds: . Chlorhexidine Gluconate Cloth  6 each Topical Daily  . mouth rinse  15 mL Mouth Rinse BID   Continuous Infusions: . sodium chloride 10 mL/hr at 05/04/21 0800  . ceFEPime (MAXIPIME) IV Stopped (05/03/21 2159)  . esmolol 75 mcg/kg/min (05/04/21 0800)   PRN Meds: sodium chloride, ipratropium-albuterol, labetalol, LORazepam, ondansetron (ZOFRAN) IV   Vital Signs    Vitals:   05/04/21 0645 05/04/21 0700 05/04/21 0759 05/04/21 0800  BP: (!) 128/98   121/86  Pulse: (!) 118 (!) 118  (!) 120  Resp: 16 (!) 24  17  Temp:   98.4 F (36.9 C)   TempSrc:   Oral   SpO2: 95% 93%  (!) 85%  Weight:      Height:        Intake/Output Summary (Last 24 hours) at 05/04/2021 0928 Last data filed at 05/04/2021 0800 Gross per 24 hour  Intake 840.97 ml  Output 1300 ml  Net -459.03 ml   Last 3 Weights 05/04/2021 05/03/2021 05/02/2021  Weight (lbs) 135 lb 12.9 oz 137 lb 2 oz 131 lb  Weight (kg) 61.6 kg 62.2 kg 59.421 kg  Some encounter information is confidential and restricted. Go to Review Flowsheets activity to see all data.      Telemetry    A fib with RVR rate of 120-130s- Personally Reviewed  ECG    Repeat EKG ordered today  - Personally Reviewed  Physical Exam   GEN: No acute distress. Resting in bed. Restless  Neck: No JVD Cardiac: Irregularly irregular , no murmurs, rubs, or gallops.  Respiratory: Diminished at base bilaterally, on 4lNC   GI: Soft, nontender, non-distended  MS: No BLE edema; No deformity. Neuro:  Alert, oriented to self only, no facial droop, confused with agitation and delirium, muscle strength 5/5 BUE and BLE Psych: Mild agitation  Labs    High Sensitivity Troponin:   Recent Labs  Lab 05/02/21 0757 05/02/21 1033 05/02/21 1253 05/02/21 1539  TROPONINIHS 161* 162* 172* 150*      Chemistry Recent Labs  Lab 05/03/21 0127 05/03/21 0649 05/03/21 1315 05/04/21 0013  NA 127* 128* 129* 132*  K 5.4* 4.0 3.9 4.2  CL 90*  --  92* 95*  CO2 26  --  24 28  GLUCOSE 110*  --  96 96  BUN 25*  --  19 19  CREATININE 0.92  --  0.86 0.94  CALCIUM 8.7*  --  8.7* 8.5*  GFRNONAA >60  --  >60 >60  ANIONGAP 11  --  13 9     Hematology Recent Labs  Lab 05/02/21 2000 05/03/21 0127 05/03/21 0649 05/04/21 0013  WBC 31.4* 30.2*  --  22.4*  RBC 2.73* 2.63*  --  2.58*  HGB 8.4* 8.0* 9.2* 7.8*  HCT 24.4* 24.4* 27.0* 24.0*  MCV 89.4 92.8  --  93.0  MCH 30.8 30.4  --  30.2  MCHC 34.4 32.8  --  32.5  RDW 15.5 15.8*  --  16.0*  PLT 350 456*  --  475*    BNP Recent Labs  Lab 05/02/21 0757  BNP 823.2*     DDimer No results for input(s): DDIMER in the last 168 hours.   Radiology    DG Chest Port 1 View  Result Date: 05/04/2021 CLINICAL DATA:  Pleural effusion EXAM: PORTABLE CHEST 1 VIEW COMPARISON:  05/03/2021, CT 05/02/2021 FINDINGS: Pulmonary insufflation is preserved. Moderate right and small left pleural effusions are again identified with associated bibasilar atelectasis or infiltrate. Mild to moderate cardiomegaly is again noted. Aortic valve replacement has been performed. Pulmonary vascularity is normal. Moderate thoracic dextroscoliosis again noted. IMPRESSION: Stable examination with bilateral pleural effusions, right greater than left, and bibasilar atelectasis or infiltrate. Stable mild-to-moderate cardiomegaly. Electronically Signed   By: Fidela Salisbury MD   On: 05/04/2021 07:07   DG CHEST  PORT 1 VIEW  Result Date: 05/03/2021 CLINICAL DATA:  Pleural effusion, shortness of breath, chest pain EXAM: PORTABLE CHEST 1 VIEW COMPARISON:  05/02/2021 FINDINGS: Cardiomegaly. Prior median sternotomy and valve replacement. Layering bilateral effusions with bibasilar atelectasis or infiltrates. Findings are similar to prior study. IMPRESSION: No significant change. Electronically Signed   By: Rolm Baptise M.D.   On: 05/03/2021 10:06   ECHOCARDIOGRAM COMPLETE  Result Date: 05/02/2021    ECHOCARDIOGRAM REPORT   Patient Name:   SHADOW STIGGERS Date of Exam: 05/02/2021 Medical Rec #:  [id number]            Height:       62.0 in Accession #:    [id number]           Weight:       131.0 lb Date of Birth:  [birth year]            BSA:          1.597 m Patient Age:    65 years             BP:           119/82 mmHg Patient Gender: F                    HR:           68 bpm. Exam Location:  Inpatient Procedure: 2D Echo, Cardiac Doppler and Color Doppler Indications:    Aortic valve disorder I35.9                 Pericardial effusion I31.3  History:        Patient has prior history of Echocardiogram examinations, most                 recent 04/21/2021. Aortic Valve Disease.  Sonographer:    Merrie Roof RDCS Referring Phys: [id number] [place]  1. Left ventricular ejection fraction, by estimation, is 65 to 70%. The left ventricle has normal function. The left ventricle has no regional wall motion abnormalities. Left ventricular diastolic parameters are consistent with Grade I diastolic dysfunction (impaired relaxation).  2. Right ventricular systolic function is normal. The right ventricular size is normal.  3. Left atrial size was severely dilated.  4. Right atrial size was mildly dilated.  5. Moderate pericardial effusion surrounds heart with some consolidation (? clot) Except for dilated IVC, effusion does not appear to be hemodynamically compromising by echo criteria.. Moderate pericardial effusion.  6.  Mild mitral valve regurgitation.  7. TR is eccentric, directed toward interatrial septum.. Tricuspid  valve regurgitation is moderate.  8. S/p AVR with 23 mm Edwards pericaridal tissue valve (04/21/21) Valve appears well seated. Peak and mean gradients through the valve are 29 adn 12 mm Hg respectively Dimensionless index is 0.54.[name] The aortic valve has been repaired/replaced. Aortic valve  regurgitation is not visualized.  9. The inferior vena cava is dilated in size with <50% respiratory variability, suggesting right atrial pressure of 15 mmHg. FINDINGS  Left Ventricle: Left ventricular ejection fraction, by estimation, is 65 to 70%. The left ventricle has normal function. The left ventricle has no regional wall motion abnormalities. The left ventricular internal cavity size was normal in size. There is  no left ventricular hypertrophy. Left ventricular diastolic parameters are consistent with Grade I diastolic dysfunction (impaired relaxation). Right Ventricle: The right ventricular size is normal. Right vetricular wall thickness was not assessed. Right ventricular systolic function is normal. Left Atrium: Left atrial size was severely dilated. Right Atrium: Right atrial size was mildly dilated. Pericardium: Moderate pericardial effusion surrounds heart with some consolidation (? clot) Except for dilated IVC, effusion does not appear to be hemodynamically compromising by echo criteria. A moderately sized pericardial effusion is present. Mitral Valve: The mitral valve is normal in structure. Mild mitral valve regurgitation. Tricuspid Valve: TR is eccentric, directed toward interatrial septum. The tricuspid valve is normal in structure. Tricuspid valve regurgitation is moderate. Aortic Valve: S/p AVR with 23 mm Edwards pericaridal tissue valve (04/21/21) Valve appears well seated. Peak and mean gradients through the valve are 29 adn 12 mm Hg respectively Dimensionless index is 0.54. The aortic valve has been  repaired/replaced. Aortic valve regurgitation is not visualized. Aortic valve mean gradient measures 12.0 mmHg. Aortic valve peak gradient measures 29.2 mmHg. Aortic valve area, by VTI measures 1.39 cm. Pulmonic Valve: The pulmonic valve was normal in structure. Pulmonic valve regurgitation is not visualized. Aorta: S/p Hemishield graft. The aortic root is normal in size and structure. Venous: The inferior vena cava is dilated in size with less than 50% respiratory variability, suggesting right atrial pressure of 15 mmHg. IAS/Shunts: The interatrial septum was not assessed.  LEFT VENTRICLE PLAX 2D LVIDd:         4.99 cm  Diastology LVIDs:         3.52 cm  LV e' medial:    7.29 cm/s LV PW:         1.11 cm  LV E/e' medial:  11.4 LV IVS:        1.00 cm  LV e' lateral:   8.59 cm/s LVOT diam:     1.80 cm  LV E/e' lateral: 9.7 LV SV:         65 LV SV Index:   41 LVOT Area:     2.54 cm  RIGHT VENTRICLE          IVC RV Basal diam:  3.80 cm  IVC diam: 2.55 cm LEFT ATRIUM              Index LA diam:        3.90 cm  2.44 cm/m LA Vol (A2C):   102.0 ml 63.87 ml/m LA Vol (A4C):   89.0 ml  55.73 ml/m LA Biplane Vol: 105.0 ml 65.75 ml/m  AORTIC VALVE AV Area (Vmax):    1.16 cm AV Area (Vmean):   1.18 cm AV Area (VTI):     1.39 cm AV Vmax:           270.00 cm/s AV Vmean:  156.000 cm/s AV VTI:            0.470 m AV Peak Grad:      29.2 mmHg AV Mean Grad:      12.0 mmHg LVOT Vmax:         123.00 cm/s LVOT Vmean:        72.300 cm/s LVOT VTI:          0.256 m LVOT/AV VTI ratio: 0.54  AORTA Ao Root diam: 2.70 cm MITRAL VALVE                TRICUSPID VALVE MV Area (PHT): 2.60 cm     TR Peak grad:   21.5 mmHg MV Decel Time: 292 msec     TR Vmax:        232.00 cm/s MV E velocity: 82.90 cm/s MV A velocity: 101.00 cm/s  SHUNTS MV E/A ratio:  0.82         Systemic VTI:  0.26 m                             Systemic Diam: 1.80 cm Dorris Carnes MD Electronically signed by Dorris Carnes MD Signature Date/Time: 05/02/2021/6:36:24 PM     Final    CT Angio Chest/Abd/Pel for Dissection W and/or W/WO  Result Date: 05/02/2021 CLINICAL DATA:  Chest pain after aortic valve repair. EXAM: CT ANGIOGRAPHY CHEST, ABDOMEN AND PELVIS TECHNIQUE: Non-contrast CT of the chest was initially obtained. Multidetector CT imaging through the chest, abdomen and pelvis was performed using the standard protocol during bolus administration of intravenous contrast. Multiplanar reconstructed images and MIPs were obtained and reviewed to evaluate the vascular anatomy. CONTRAST:  58mL OMNIPAQUE IOHEXOL 350 MG/ML SOLN COMPARISON:  Same day.  April 11, 2021. FINDINGS: CTA CHEST FINDINGS Cardiovascular: Status post aortic valve repair and surgical repair of ascending thoracic aortic aneurysm. There is noted moderate pericardial fluid with high density within it at an average Hounsfield measurement of 43 along the left cardiac border, concerning for hemopericardium. Coronary artery calcifications are noted. There is no definite dissection is seen involving the thoracic aorta. Atherosclerosis is noted involving the transverse aortic arch and descending thoracic aorta. There is noted an outpouching of contrast measuring 10 x 9 mm arising from the transverse aortic arch inferiorly into the left with adjacent calcification or other high-density material, best seen on image number 77 of series 9. This may represent a surgical access point or graft outpouching, but small contained pseudoaneurysm cannot be excluded. No definite active extravasation into the pericardium is noted. Mediastinum/Nodes: No enlarged mediastinal, hilar, or axillary lymph nodes. Thyroid gland, trachea, and esophagus demonstrate no significant findings. Lungs/Pleura: No pneumothorax is noted. Small to moderate size right pleural effusion is noted with associated atelectasis of the right upper and lower lobes. Small loculated effusions are seen involving the superior and lower aspects of the left major fissure.  Mild left basilar subsegmental atelectasis is noted. Musculoskeletal: No chest wall abnormality. No acute or significant osseous findings. Review of the MIP images confirms the above findings. CTA ABDOMEN AND PELVIS FINDINGS VASCULAR Aorta: Atherosclerosis of thoracic aorta is noted without aneurysm or dissection. Celiac: Patent without evidence of aneurysm, dissection, vasculitis or significant stenosis. SMA: Patent without evidence of aneurysm, dissection, vasculitis or significant stenosis. Renals: Both renal arteries are patent without evidence of aneurysm, dissection, vasculitis, fibromuscular dysplasia or significant stenosis. IMA: Patent without evidence of aneurysm, dissection, vasculitis or significant stenosis. Inflow:  Patent without evidence of aneurysm, dissection, vasculitis or significant stenosis. Veins: No obvious venous abnormality within the limitations of this arterial phase study. Review of the MIP images confirms the above findings. NON-VASCULAR Hepatobiliary: No focal liver abnormality is seen. No gallstones, gallbladder wall thickening, or biliary dilatation. Pancreas: Unremarkable. No pancreatic ductal dilatation or surrounding inflammatory changes. Spleen: There is interval development of a moderate amount of high density material around the spleen with an average Hounsfield measurement of 56, concerning for possible perisplenic hematoma. Adrenals/Urinary Tract: Adrenal glands are unremarkable. Kidneys are normal, without renal calculi, focal lesion, or hydronephrosis. Bladder is unremarkable. Stomach/Bowel: Stomach is within normal limits. Appendix appears normal. No evidence of bowel wall thickening, distention, or inflammatory changes. Sigmoid diverticulosis is noted without inflammation. Lymphatic: No significant adenopathy is noted. Reproductive: Uterus and bilateral adnexa are unremarkable. Other: No hernia is noted. Musculoskeletal: No acute or significant osseous findings. Review of  the MIP images confirms the above findings. IMPRESSION: Status post recent aortic valve replacement and surgical repair of ascending thoracic aortic aneurysm. Moderate size pericardial effusion is noted which contains high density material and is concerning for some degree of hemopericardium as noted on previous exam of same day. There does not appear to be any evidence of thoracic aortic dissection. 10 x 9 mm outpouching of contrast is seen arising from the transverse aortic arch inferiorly and to the left which may represent a surgical access point or portion of the aortic graft, but focal pseudoaneurysm cannot be excluded. No definite active extravasation into other portions of the pericardium is noted. Correlation with the intraoperative details of the patient's recent surgery is recommended. Also noted is moderate sized complex material around the spleen concerning for possible perisplenic hematoma of unknown etiology. Small to moderate size right pleural effusion is noted with adjacent atelectasis of the right upper and lower lobes. Small loculated effusions are seen involving the left major fissure with mild atelectasis in the left lower lobe. Sigmoid diverticulosis without inflammation. Aortic Atherosclerosis (ICD10-I70.0). Electronically Signed   By: Marijo Conception M.D.   On: 05/02/2021 13:02   ECHOCARDIOGRAM LIMITED  Result Date: 05/04/2021    ECHOCARDIOGRAM LIMITED REPORT   Patient Name:   ZAREA SMART Date of Exam: 05/04/2021 Medical Rec #:  [id number]            Height:       62.0 in Accession #:    [id number]           Weight:       135.8 lb Date of Birth:  03-08-1956            BSA:          1.622 m Patient Age:    63 years             BP:           128/98 mmHg Patient Gender: F                    HR:           118 bpm. Exam Location:  Inpatient Procedure: Limited Echo, Limited Color Doppler and Cardiac Doppler STAT ECHO Indications:    Atrial Fibrillation I48.91  History:        Patient  has prior history of Echocardiogram examinations, most                 recent 05/02/2021. Aortic Valve Disease, Arrythmias:Atrial  Fibrillation, Signs/Symptoms:Murmur; Risk Factors:Hypertension.                 Bilateral pleural effusions, hemorrhagic pericardial effusion.                 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                 Aortic Valve: 23 mm Inspiris pericardial valve is present in the                 aortic position. Procedure Date: 04/21/2021.  Sonographer:    Darlina Sicilian RDCS Referring Phys: [id number] [place]  1. Limited study to evaluate moderate pericardial effusion. The pericardial effusion is circumferential. There is no evidence of increase pericardial pressure, the there is IVC dilation. In RV focus view, RV has delayed expansion but not evidence of collapse. There is echodensity within the effusion best seen in the [facility] view.  2. The inferior vena cava is dilated in size with <50% respiratory variability, suggesting right atrial pressure of 15 mmHg.  3. Left ventricular ejection fraction, by estimation, is 70 to 75%. The left ventricle has hyperdynamic function. The left ventricle has no regional wall motion abnormalities.  4. Right ventricular systolic function is normal. The right ventricular size is normal.  5. Mild mitral valve regurgitation.  6. Aortic valve regurgitation is not visualized. There is a 23 mm Inspiris pericardial valve present in the aortic position. Procedure Date: 04/21/2021. Comparison(s): A prior study was performed on 05/02/21. Prior images reviewed side by side. Stable effusion; LV appears hyperdynamic. Hemorrhagic pericardial effusion is on differential given recent SAVR. FINDINGS  Left Ventricle: Left ventricular ejection fraction, by estimation, is 70 to 75%. The left ventricle has hyperdynamic function. The left ventricle has no regional wall motion abnormalities. Right Ventricle: The right ventricular size is normal. Right  ventricular systolic function is normal. Pericardium: A moderately sized pericardial effusion is present. The pericardial effusion is circumferential. There is no evidence of cardiac tamponade. Mitral Valve: Mild mitral valve regurgitation. Aortic Valve: Aortic valve regurgitation is not visualized. Aortic valve mean gradient measures 7.0 mmHg. Aortic valve peak gradient measures 16.3 mmHg. There is a 23 mm Inspiris pericardial valve present in the aortic position. Procedure Date: 04/21/2021. Venous: The inferior vena cava is dilated in size with less than 50% respiratory variability, suggesting right atrial pressure of 15 mmHg. AORTIC VALVE AV Vmax:           202.00 cm/s AV Vmean:          125.000 cm/s AV VTI:            0.312 m AV Peak Grad:      16.3 mmHg AV Mean Grad:      7.0 mmHg LVOT Vmax:         102.00 cm/s LVOT Vmean:        68.400 cm/s LVOT VTI:          0.110 m LVOT/AV VTI ratio: 0.35  SHUNTS Systemic VTI: 0.11 m Rudean Haskell MD Electronically signed by Rudean Haskell MD Signature Date/Time: 05/04/2021/8:26:34 AM    Final     Cardiac Studies   Echo from 05/04/21:  1. Limited study to evaluate moderate pericardial effusion. The  pericardial effusion is circumferential. There is no evidence of increase  pericardial pressure, the there is IVC dilation. In RV focus view, RV has  delayed expansion but not evidence of  collapse. There is echodensity within the effusion best seen  in the [facility]  view.  2. The inferior vena cava is dilated in size with <50% respiratory  variability, suggesting right atrial pressure of 15 mmHg.  3. Left ventricular ejection fraction, by estimation, is 70 to 75%. The  left ventricle has hyperdynamic function. The left ventricle has no  regional wall motion abnormalities.  4. Right ventricular systolic function is normal. The right ventricular  size is normal.  5. Mild mitral valve regurgitation.  6. Aortic valve regurgitation is not visualized.  There is a 23 mm  Inspiris pericardial valve present in the aortic position. Procedure Date:  04/21/2021.   Echo from 05/02/21:  1. Left ventricular ejection fraction, by estimation, is 65 to 70%. The  left ventricle has normal function. The left ventricle has no regional  wall motion abnormalities. Left ventricular diastolic parameters are  consistent with Grade I diastolic  dysfunction (impaired relaxation).  2. Right ventricular systolic function is normal. The right ventricular  size is normal.  3. Left atrial size was severely dilated.  4. Right atrial size was mildly dilated.  5. Moderate pericardial effusion surrounds heart with some consolidation  (? clot) Except for dilated IVC, effusion does not appear to be  hemodynamically compromising by echo criteria.. Moderate pericardial  effusion.  6. Mild mitral valve regurgitation.  7. TR is eccentric, directed toward interatrial septum.. Tricuspid valve  regurgitation is moderate.  8. S/p AVR with 23 mm Edwards pericaridal tissue valve (04/21/21) Valve  appears well seated. Peak and mean gradients through the valve are 29 adn  12 mm Hg respectively Dimensionless index is 0.54.[name] The aortic valve has  been repaired/replaced. Aortic valve  regurgitation is not visualized.  9. The inferior vena cava is dilated in size with <50% respiratory  variability, suggesting right atrial pressure of 15 mmHg.    Patient Profile     Ms. Levison is a 65 y/o woman with a history of HTN, tobacco abuse, anxiety, bicuspid aortic valve with severe aortic stenosis with a 4.7cm fusiform ascending aortic aneurysm s/p aortic valve replacement with a 23 mm Edwards bovine pericardial tissue valve  and replacement of ascending aorta and aortic arch with aorto-innominate and aorto-left carotid bypass utilizing a 28 x 10 x 8 x 8 x10 Hemashield graft on 04/21/21 by CTS Dr Cyndia Bent. She was discharged on 04/27/21 to home, returned to ER on 05/02/21 with nausea,  SOB, chest pain, dizziness, and generalized weakness.   At admission, she was hypertensive with SBP 180s and hypoxic at ED. WBC 32900. Na 124. K 5.2. Cr 1.3 (from 0.78). A noncontrast CT scan of the chest showed a moderate sized pericardial effusion with density suggestive of hemopericardium.  Small bilateral pleural effusions. No thoracic aortic dissection. She was admitted to ICU with concern of sepsis, started on broad spectrum antibiotic. Cardiology is consulted and following for dyspnea.   Echo from 5/16 showed EF 65-70%. No RWMA. Grade I DD. RV normal. LA severely dilated. Moderate pericardiac effusion surrounds heart with some consolidation concern for clot, IVC dilated, effusion does not appear to be hemodynamically compromising. Moderate TR. S/p AVR with 23 mm Edwards pericaridal tissue valve (04/21/21) Valve appears well seated. Peak and mean gradients through the valve are 29 and 12 mm Hg respectively Dimensionless index is 0.54    Assessment & Plan    Presumed Sepsis  - leukocytosis, lactic acidosis, hypoxia, systematic symptoms POA  - 1 blood culture from 5/16 + for staphylococcus epidermidis - procal negative  - CT chest showed  pericardial effusion with density suggestive of hemopericardium.  Small bilateral pleural effusions with probable loculated pleural effusion of left  - consider repeat blood culture and ID consult, currently on cefepime  - consider diagnotic tap of effusions for analysis rule out infectious etiology   Acute hypoxic respiratory failure - CT chest with moderate pericardial effusion , bilateral pleural effusions with probable loculated pleural effusion of left  - consider diagnostic thoracentesis - continue oxygen support , on 4LNC   Pericardial effusion  - noted on CT and Echo, possible hemorrhagic, no Tamponade   - conservative management recommended in setting of no tachycardia, hypotension, or apparent hemodynamic sequelae, serial Echo  - repeat Echo  today showed EF 70-75%, no RWMA,  the pericardial effusion appears stable   Paroxysmal atrial fib with RVR - unable start anticoagulation due to questionable hemorrhagic pericardial effusion and possible splenic subcapsular hematoma. - currently on Esmolol gtt , did not respond to Cardizem bolus per ICU, was loaded with 0.25mg  digoxin  - K 4.2, check Mag , keep K >4 and Mag >2  - repeat EKG today  - consider amiodarone gtt if inadequate response to rate control   Bicuspid aortic valve with severe aortic stenosis  4.7cm fusiform ascending aortic aneurysm  - s/p aortic valve replacement with a 23 mm Edwards bovine pericardial tissue valve  and replacement of ascending aorta and aortic arch with aorto-innominate and aorto-left carotid bypass utilizing a 28 x 10 x 8 x 8 x10 Hemashield graft on 04/21/21 by CTS Dr Cyndia Bent. - CT surgery following   Hypertension - BP is controlled at this time, home meds lisinopril, metoprolol held at this time   Acute encephalopathy  - multifactorial due to ?sepsis + ICU delirium + metabolic derangement + cefepime neurotoxicity - CT brain at admission without ICH  - avoid sedating agent if able   Hyponatremia - Na correcting properly    Anemia  - Hgb dropping to 7.8 today from 8-9 ranges - CT 5/16 showed interval development of a moderate amount of high density material around the spleen with an average Hounsfield measurement of 56, concerning for possible perisplenic hematoma - recommend repeat H&H for confirmation, monitor for bleeding        For questions or updates, please contact [facility] Please consult [email or web address] for contact info under        Signed, Margie Billet, NP  05/04/2021, 9:28 AM    Patient seen, examined. Available data reviewed. Agree with findings, assessment, and plan as outlined by Margie Billet, NP.  The patient is independently interviewed and examined.  She is confused this morning.  HEENT is normal, lungs are clear except for  diminished breath sounds in the bases, heart is irregularly irregular with a soft ejection murmur at the right upper sternal border, abdomen is soft and nontender, extremities have no edema.  Telemetry is reviewed and shows that she was in sinus rhythm from about 1030 yesterday morning until about 1:30 AM this morning when she went back into rapid atrial fibrillation.  She remains in atrial fibrillation currently with a heart rate of 120 bpm.  The patient had a follow-up echocardiogram this morning that I have personally reviewed.  This demonstrates a stable moderate pericardial effusion unchanged from her study a few days ago.  I think she is going to continue to have problems with atrial fibrillation considering her current clinical problems.  I would recommend IV amiodarone to control her atrial fibrillation.  She cannot be anticoagulated with  what is probably a hemorrhagic pericardial effusion and a splenic subcapsular hematoma.  We will wean her off of esmolol once she is better controlled on IV amiodarone.  We will continue to follow with you.  Sherren Mocha, M.D. 05/04/2021 12:15 PM

## 2021-05-04 NOTE — Progress Notes (Signed)
eLink Physician-Brief Progress Note Patient Name: Deborah Koch DOB: [birth year] MRN: [id number]   Date of Service  05/04/2021  HPI/Events of Note  /18/2022   HPI/Events of Note  Patient with persistent Afib while on an Esmolol infusion, despite two iv doses of Cardizem, she is allergic to iodine, there is no echocardiogram on file, cardiology is following the patient and ordered the Esmolol infusion.  eICU Interventions  Will give patient 0.25 mg of Digoxin x 1 and order an echocardiogram, if this fails to resolve the Afib (or at least slow the ventricular response rate), I will defer to cardiology for  further recommendations.     eICU Interventions          Frederik Pear 05/04/2021, 6:19 AM

## 2021-05-04 NOTE — Progress Notes (Signed)
[place] Progress Note Patient Name: Deborah Koch DOB: 03-10-1956 MRN: [id number]   Date of Service  05/04/2021  HPI/Events of Note  Patient with Afib with RVR on an Esmolol infusion., Cardizem 5 mg iv did not materially impact the rhythm and the ventricular response rate, patient is allergic to Iodine. Patient's BP tolerated the 5 mg of Cardizem iv very well.  eICU Interventions  Cardizem 10 mg iv x 1 ordered.        Kerry Kass Bryana Froemming 05/04/2021, 3:06 AM

## 2021-05-04 NOTE — Progress Notes (Addendum)
[place] Progress Note Patient Name: Deborah Koch DOB: 1956-04-18 MRN: [id number]   Date of Service  05/04/2021  HPI/Events of Note  Patient went into Afib with RVR  While on an Esmolol infusion, QTc 443.  eICU Interventions  Cardizem 5 mg iv x 1        [name] U [name] 05/04/2021, 2:09 AM

## 2021-05-04 NOTE — Evaluation (Signed)
Clinical/Bedside Swallow Evaluation Patient Details  Name: Deborah Koch MRN: [id number] Date of Birth: 05/31/1956  Today's Date: 05/04/2021 Time: SLP Start Time (ACUTE ONLY): 1046 SLP Stop Time (ACUTE ONLY): 1100 SLP Time Calculation (min) (ACUTE ONLY): 14 min  Past Medical History:  Past Medical History:  Diagnosis Date  . Anxiety   . C. difficile colitis    a. 07/2014.  [name] Congenital bicuspid aortic valve   . Depression   . Dilated Ascending Aorta    a. 12/2013 Echo: 4.45cm.  . Diverticulitis    hospitalized; subsequent c diff infection.   [name] GERD (gastroesophageal reflux disease)   . H/O mastitis 2015  . Heart murmur   . History of tobacco abuse    a. 20 yrs, 1/4 ppd - quit.  [name] HTN (hypertension)   . Hypokalemia   . Moderate to severe aortic stenosis    a. 12/2013 Echo: EF 60-65%, Grade 1 DD, bicuspid AoV with mod-sev AS [50mmHg mean gradient, AoV area 0.69cm^2 (VTI)], mod dil ascending Ao - 4.45cm, mild TR, PASP 56mmHg.  . Multilevel degenerative disc disease   . Pneumonia    at age 50  . Scoliosis    Past Surgical History:  Past Surgical History:  Procedure Laterality Date  . AORTA -INNOMIATE BYPASS N/A 04/21/2021   Procedure: AORTA -[place] M[place] BYPASS;  Surgeon: Gaye Pollack, MD;  Location: [facility] OR;  Service: Vascular;  Laterality: N/A;  . AORTIC VALVE REPLACEMENT N/A 04/21/2021   Procedure: AORTIC VALVE REPLACEMENT (AVR) ON PUMP WITH 23MM INSPIRIS AORTIC VALVE;  Surgeon: Gaye Pollack, MD;  Location: [facility];  Service: Open Heart Surgery;  Laterality: N/A;  . BARTHOLIN GLAND CYST EXCISION    . BREAST BIOPSY Left    neg  . CAROTID-SUBCLAVIAN BYPASS GRAFT Left 04/21/2021   Procedure: AORTA LEFT COMMON CAROTID BYPASS;  Surgeon: Gaye Pollack, MD;  Location: [place] OR;  Service: Vascular;  Laterality: Left;  . COLONOSCOPY  2013   Dr. Allen Norris  . COLONOSCOPY WITH PROPOFOL N/A 10/23/2017   Procedure: COLONOSCOPY WITH PROPOFOL;  Surgeon: Lucilla Lame, MD;  Location: [facility]  ENDOSCOPY;  Service: Endoscopy;  Laterality: N/A;  . OOPHORECTOMY  2016  . OVARY SURGERY  2016   cyst on right ovary  . REPLACEMENT ASCENDING AORTA N/A 04/21/2021   Procedure: REPLACEMENT ASCENDING & ARCH AORTA WITH HEMASHIELD PLATINUM GRAFT 28 [id number];  Surgeon: Gaye Pollack, MD;  Location: [place];  Service: Open Heart Surgery;  Laterality: N/A;  CIRC ARREST  RIGHT AXILLARY ARTERY CANNULATION  . RIGHT/LEFT HEART CATH AND CORONARY ANGIOGRAPHY N/A 03/24/2021   Procedure: RIGHT/LEFT HEART CATH AND CORONARY ANGIOGRAPHY;  Surgeon: Minna Merritts, MD;  Location: [facility];  Service: Cardiovascular;  Laterality: N/A;  . SALPINGECTOMY Bilateral   . TEE WITHOUT CARDIOVERSION N/A 04/21/2021   Procedure: TRANSESOPHAGEAL ECHOCARDIOGRAM (TEE);  Surgeon: Gaye Pollack, MD;  Location: [facility];  Service: Open Heart Surgery;  Laterality: N/A;   HPI:  Pt is a 65 y/o woman who presented to ED with complaints of pressure like chest pain and worsening SOB after several days of feeling weak, found to have acute respiratory failure with hypomexia. CT chest (05/02/21) revealed moderate pericardial effusion. She was admitted to ICU with concern of sepsis, started on broad spectrum antibiotic. Cardiology following for dyspnea. Chest xray (05/04/21) Stable examination with bilateral pleural effusions, right greater than left, and bibasilar atelectasis or infiltrate. Per RN note (05/02/21), pt passed 3 oz water test, but had  difficulty swallowing meds, no coughing. PMH: HTN, tobacco abuse, anxiety, bicuspid aortic valve s/p SAVR and aortic aneurysm repair on 04/21/21 who was discharged 5/11.   Assessment / Plan / Recommendation Clinical Impression  Pt alert, pleasantly confused and requesting something to drink upon SLP arrival. She repositioned herself upright in bed for safe PO intake, without cueing from clinician. Oral mechansim examination unremarkable and adequate dentition noted. Trials of thin liquids via  consecutive straw sips resulted in inconsistent throat clearing and delayed coughing x2 after the swallow, with some improvement noted with small, individual sips. All solids tolerated without overt s/sx of oropharyngeal dysphagia. Recommend regular diet, thin liquids with staff to provide full supervision for all meals to cue for small sips/bites at slow rate. SLP to f/u for tolerance given decreased mentation and recent RN report of difficulty swallow meds. Above discussed with pt and RN who verbalized agreement.  SLP Visit Diagnosis: Dysphagia, unspecified (R13.10)    Aspiration Risk  Mild aspiration risk    Diet Recommendation Regular;Thin liquid   Liquid Administration via: Straw;Cup Medication Administration: Whole meds with liquid Supervision: Patient able to self feed;Full supervision/cueing for compensatory strategies Compensations: Minimize environmental distractions;Slow rate;Small sips/bites Postural Changes: Seated upright at 90 degrees    Other  Recommendations Oral Care Recommendations: Oral care BID   Follow up Recommendations  (TBD)      Frequency and Duration min 2x/week  2 weeks       Prognosis Prognosis for Safe Diet Advancement: Good Barriers to Reach Goals: Cognitive deficits      Swallow Study   General Date of Onset: 05/02/21 HPI: Pt is a 65 y/o woman who presented to ED with complaints of pressure like chest pain and worsening SOB after several days of feeling weak, found to have acute respiratory failure with hypomexia. CT chest (05/02/21) revealed moderate pericardial effusion. She was admitted to ICU with concern of sepsis, started on broad spectrum antibiotic. Cardiology following for dyspnea. Chest xray (05/04/21) Stable examination with bilateral pleural effusions, right greater than left, and bibasilar atelectasis or infiltrate. Per RN note (05/02/21), pt passed 3 oz water test, but had difficulty swallowing meds, no coughing. PMH: HTN, tobacco abuse,  anxiety, bicuspid aortic valve s/p SAVR and aortic aneurysm repair on 04/21/21 who was discharged 5/11. Type of Study: Bedside Swallow Evaluation Previous Swallow Assessment: none Diet Prior to this Study: NPO Temperature Spikes Noted: No Respiratory Status: Nasal cannula History of Recent Intubation: No Behavior/Cognition: Alert;Cooperative;Pleasant mood;Confused Oral Cavity Assessment: Within Functional Limits Oral Care Completed by SLP: No Oral Cavity - Dentition: Adequate natural dentition Vision: Functional for self-feeding Self-Feeding Abilities: Able to feed self Patient Positioning: Upright in bed;Postural control adequate for testing Baseline Vocal Quality: Normal Volitional Cough: Strong Volitional Swallow: Able to elicit    Oral/Motor/Sensory Function Overall Oral Motor/Sensory Function: Within functional limits   Ice Chips Ice chips: Not tested   Thin Liquid Thin Liquid: Impaired Presentation: Straw Pharyngeal  Phase Impairments: Cough - Delayed;Throat Clearing - Immediate    Nectar Thick Nectar Thick Liquid: Not tested   Honey Thick Honey Thick Liquid: Not tested   Puree Puree: Within functional limits   Solid   Ellwood Dense, MA, [place] Office Number: [phone]  Solid: Within functional limits      Acie Fredrickson 05/04/2021,11:44 AM

## 2021-05-04 NOTE — Progress Notes (Signed)
  TCTS Subjective:  She has been confused, agitated and hallucinating today. Received some Ativan. She is now calm and able to talk to me but confused.  Rapid atrial fib converted with IV amio. Weaning esmolol.  Objective: Vital signs in last 24 hours: Temp:  [97.8 F (36.6 C)-98.7 F (37.1 C)] 98.7 F (37.1 C) (05/18 1511) Pulse Rate:  [70-149] 70 (05/18 1700) Cardiac Rhythm: Atrial fibrillation (05/18 1200) Resp:  [11-28] 18 (05/18 1700) BP: (90-147)/(64-111) 124/73 (05/18 1700) SpO2:  [83 %-100 %] 99 % (05/18 1700) Weight:  [61.6 kg] 61.6 kg (05/18 0500)  Hemodynamic parameters for last 24 hours:    Intake/Output from previous day: 05/17 0701 - 05/18 0700 In: 857 [I.V.:656.9; IV Piggyback:200.1] Out: 1300 [Urine:1300] Intake/Output this shift: Total I/O In: 367.6 [I.V.:267.5; IV Piggyback:100.1] Out: 100 [Urine:100]  General appearance: alert, cooperative and confused Heart: regular rate and rhythm, S1, S2 normal, no murmur Lungs: diminished breath sounds bibasilar Abdomen: soft, non-tender; bowel sounds normal Extremities: edema mild Wound: incisions healing well.  Lab Results: Recent Labs    05/04/21 0013 05/04/21 1343  WBC 22.4* 21.9*  HGB 7.8* 9.7*  HCT 24.0* 30.1*  PLT 475* 554*   BMET:  Recent Labs    05/03/21 1315 05/04/21 0013  NA 129* 132*  K 3.9 4.2  CL 92* 95*  CO2 24 28  GLUCOSE 96 96  BUN 19 19  CREATININE 0.86 0.94  CALCIUM 8.7* 8.5*    PT/INR:  Recent Labs    05/03/21 0127  LABPROT 17.1*  INR 1.4*   ABG    Component Value Date/Time   PHART 7.458 (H) 05/03/2021 0649   HCO3 25.7 05/03/2021 0649   TCO2 27 05/03/2021 0649   ACIDBASEDEF 3.0 (H) 04/22/2021 0446   O2SAT 93.0 05/03/2021 0649   CBG (last 3)  Recent Labs    05/04/21 0758 05/04/21 1103 05/04/21 1508  GLUCAP 88 83 138*    Assessment/Plan:  She has been hemodynamically stable.  Atrial fib converted with amio IV. Wean off esmolol.  No new culture  results. Continues on antibiotic for possible sepsis.  Repeat echo reviewed and essentially unchanged. Moderate pericardial effusion without tamponade. Continue to follow. LV function normal, aortic valve functioning fine.  Bilateral pleural effusions. R>L. Would do right thoracentesis at some point.  Altered mental status: delirium from baseline anxiety, chronic Klonopin use which she has been off of here, ICU, possible sepsis.   Discussed status with daughter.   LOS: 2 days    Gaye Pollack 05/04/2021

## 2021-05-04 NOTE — Progress Notes (Signed)
Pharmacy Antibiotic Note  Deborah Koch is a 65 y.o. female with recent thoracic aortic aneurysm repair and AVR who presents 5/16 with CP, concern for sepsis. Pharmacy was consulted for Cefepime dosing.  SCr has improved with hydration to 0.94 (BL around 0.6-0.7).  Team concerned that S.epi in 1/4 bottles is not contaminant given recent surgery.  WBC 30 >22. Afebrile.  Plan: - Continue Cefepime 2g IV every 12 hours - F/u ability to de-escalate antibiotics - Will continue to follow renal function, culture results, LOT  Height: 5\' 2"  (157.5 cm) Weight: 61.6 kg (135 lb 12.9 oz) IBW/kg (Calculated) : 50.1  Temp (24hrs), Avg:98.3 F (36.8 C), Min:97.8 F (36.6 C), Max:98.6 F (37 C)  Recent Labs  Lab 05/02/21 0757 05/02/21 0807 05/02/21 1253 05/02/21 1347 05/02/21 1539 05/02/21 2000 05/02/21 2050 05/03/21 0127 05/03/21 0901 05/03/21 1315 05/04/21 0013  WBC 32.9*  --   --   --  32.5* 31.4*  --  30.2*  --   --  22.4*  CREATININE 1.31*  --  1.13*  --  1.17*  --   --  0.92  --  0.86 0.94  LATICACIDVEN  --    < >  --  2.2* 2.6*  --  2.8*  --  2.2* 1.7  --    < > = values in this interval not displayed.    Estimated Creatinine Clearance: 51.5 mL/min (by C-G formula based on SCr of 0.94 mg/dL).    Allergies  Allergen Reactions  . Iodine Rash  . Vicodin [Hydrocodone-Acetaminophen] Hives and Rash    Antimicrobials this admission: Vancomycin 5/16 x 1 Cefepime 5/16 >>  Microbiology results: 5/16 BCx >> S.epi 1/4 bottles (no resistance) 5/16 Resp panel >> neg  Thank you for allowing pharmacy to be a part of this patient's care.  Dimple Nanas, PharmD PGY-1 Acute Care Pharmacy Resident 05/04/2021 10:47 AM   **Pharmacist phone directory can now be found on [email or web address] ([email or web address]).  Listed under [place].

## 2021-05-05 ENCOUNTER — Ambulatory Visit: Payer: Medicare Other | Admitting: Surgery

## 2021-05-05 ENCOUNTER — Inpatient Hospital Stay (HOSPITAL_COMMUNITY): Payer: Medicare Other

## 2021-05-05 DIAGNOSIS — A419 Sepsis, unspecified organism: Secondary | ICD-10-CM | POA: Diagnosis not present

## 2021-05-05 DIAGNOSIS — J90 Pleural effusion, not elsewhere classified: Secondary | ICD-10-CM | POA: Diagnosis not present

## 2021-05-05 DIAGNOSIS — I161 Hypertensive emergency: Secondary | ICD-10-CM | POA: Diagnosis not present

## 2021-05-05 DIAGNOSIS — R410 Disorientation, unspecified: Secondary | ICD-10-CM | POA: Diagnosis not present

## 2021-05-05 DIAGNOSIS — I4891 Unspecified atrial fibrillation: Secondary | ICD-10-CM | POA: Diagnosis not present

## 2021-05-05 LAB — GLUCOSE, CAPILLARY
Glucose-Capillary: 124 mg/dL — ABNORMAL HIGH (ref 70–99)
Glucose-Capillary: 104 mg/dL — ABNORMAL HIGH (ref 70–99)
Glucose-Capillary: 134 mg/dL — ABNORMAL HIGH (ref 70–99)
Glucose-Capillary: 142 mg/dL — ABNORMAL HIGH (ref 70–99)
Glucose-Capillary: 140 mg/dL — ABNORMAL HIGH (ref 70–99)
Glucose-Capillary: 117 mg/dL — ABNORMAL HIGH (ref 70–99)

## 2021-05-05 LAB — BASIC METABOLIC PANEL
Anion gap: 9 (ref 5–15)
Anion gap: 8 (ref 5–15)
BUN: 14 mg/dL (ref 8–23)
BUN: 11 mg/dL (ref 8–23)
CO2: 27 mmol/L (ref 22–32)
CO2: 28 mmol/L (ref 22–32)
Calcium: 8.3 mg/dL — ABNORMAL LOW (ref 8.9–10.3)
Calcium: 8.5 mg/dL — ABNORMAL LOW (ref 8.9–10.3)
Chloride: 95 mmol/L — ABNORMAL LOW (ref 98–111)
Chloride: 96 mmol/L — ABNORMAL LOW (ref 98–111)
Creatinine, Ser: 0.81 mg/dL (ref 0.44–1.00)
Creatinine, Ser: 0.87 mg/dL (ref 0.44–1.00)
GFR, Estimated: >60 mL/min (ref >60)
GFR, Estimated: >60 mL/min (ref >60)
Glucose, Bld: 129 mg/dL — ABNORMAL HIGH (ref 70–99)
Glucose, Bld: 117 mg/dL — ABNORMAL HIGH (ref 70–99)
Potassium: 3.1 mmol/L — ABNORMAL LOW (ref 3.5–5.1)
Potassium: 3.9 mmol/L (ref 3.5–5.1)
Sodium: 131 mmol/L — ABNORMAL LOW (ref 135–145)
Sodium: 132 mmol/L — ABNORMAL LOW (ref 135–145)

## 2021-05-05 LAB — GLUCOSE, PLEURAL OR PERITONEAL FLUID: Glucose, Fluid: 139 mg/dL

## 2021-05-05 LAB — CHOLESTEROL, TOTAL: Cholesterol: 85 mg/dL (ref 0–200)

## 2021-05-05 LAB — AMYLASE, PLEURAL OR PERITONEAL FLUID: Amylase, Fluid: 18 U/L

## 2021-05-05 LAB — PROTEIN, PLEURAL OR PERITONEAL FLUID: Total protein, fluid: <3 g/dL

## 2021-05-05 LAB — CBC
HCT: 26.8 % — ABNORMAL LOW (ref 36.0–46.0)
Hemoglobin: 8.7 g/dL — ABNORMAL LOW (ref 12.0–15.0)
MCH: 30 pg (ref 26.0–34.0)
MCHC: 32.5 g/dL (ref 30.0–36.0)
MCV: 92.4 fL (ref 80.0–100.0)
Platelets: 476 10*3/uL — ABNORMAL HIGH (ref 150–400)
RBC: 2.9 MIL/uL — ABNORMAL LOW (ref 3.87–5.11)
RDW: 16.2 % — ABNORMAL HIGH (ref 11.5–15.5)
WBC: 20.6 10*3/uL — ABNORMAL HIGH (ref 4.0–10.5)
nRBC: 0.7 % — ABNORMAL HIGH (ref 0.0–0.2)

## 2021-05-05 LAB — LACTATE DEHYDROGENASE, PLEURAL OR PERITONEAL FLUID: LD, Fluid: 138 U/L — ABNORMAL HIGH (ref 3–23)

## 2021-05-05 LAB — CULTURE, BLOOD (ROUTINE X 2): Special Requests: ADEQUATE

## 2021-05-05 LAB — BODY FLUID CELL COUNT WITH DIFFERENTIAL
Eos, Fluid: 0 %
Lymphs, Fluid: 86 %
Monocyte-Macrophage-Serous Fluid: 3 % — ABNORMAL LOW (ref 50–90)
Neutrophil Count, Fluid: 11 % (ref 0–25)
Total Nucleated Cell Count, Fluid: 310 cu mm (ref 0–1000)

## 2021-05-05 LAB — ALBUMIN: Albumin: 2.9 g/dL — ABNORMAL LOW (ref 3.5–5.0)

## 2021-05-05 LAB — ALBUMIN, PLEURAL OR PERITONEAL FLUID: Albumin, Fluid: 1.2 g/dL

## 2021-05-05 LAB — MAGNESIUM: Magnesium: 2 mg/dL (ref 1.7–2.4)

## 2021-05-05 LAB — PROTEIN, TOTAL: Total Protein: 5.8 g/dL — ABNORMAL LOW (ref 6.5–8.1)

## 2021-05-05 LAB — LACTATE DEHYDROGENASE: LDH: 492 U/L — ABNORMAL HIGH (ref 98–192)

## 2021-05-05 MED ORDER — CEFAZOLIN SODIUM-DEXTROSE 2-4 GM/100ML-% IV SOLN
2.0000 g | Freq: Three times a day (TID) | INTRAVENOUS | Status: DC
  Administered 2021-05-05 – 2021-05-08 (×8): 2 g via INTRAVENOUS
  Filled 2021-05-05 (×13): qty 100

## 2021-05-05 MED ORDER — METOPROLOL TARTRATE 25 MG PO TABS
50.0000 mg | ORAL_TABLET | Freq: Two times a day (BID) | ORAL | Status: DC
  Administered 2021-05-05 (×2): 50 mg via ORAL
  Filled 2021-05-05: qty 2

## 2021-05-05 MED ORDER — IBUPROFEN 200 MG PO TABS
400.0000 mg | ORAL_TABLET | Freq: Once | ORAL | Status: AC
  Administered 2021-05-05: 400 mg via ORAL
  Filled 2021-05-05: qty 2

## 2021-05-05 MED ORDER — POTASSIUM CHLORIDE 10 MEQ/100ML IV SOLN
10.0000 meq | INTRAVENOUS | Status: AC
  Administered 2021-05-05 (×4): 10 meq via INTRAVENOUS
  Filled 2021-05-05 (×4): qty 100

## 2021-05-05 MED ORDER — AMIODARONE HCL 200 MG PO TABS
400.0000 mg | ORAL_TABLET | Freq: Two times a day (BID) | ORAL | Status: DC
  Administered 2021-05-05 – 2021-05-07 (×5): 400 mg via ORAL
  Filled 2021-05-05 (×5): qty 2

## 2021-05-05 MED ORDER — AMIODARONE HCL 200 MG PO TABS: 400.0000 mg | ORAL_TABLET | Freq: Two times a day (BID) | ORAL | Status: DC

## 2021-05-05 MED ORDER — POTASSIUM CHLORIDE CRYS ER 20 MEQ PO TBCR
20.0000 meq | EXTENDED_RELEASE_TABLET | ORAL | Status: AC
Start: 2021-05-05 — End: 2021-05-05
  Administered 2021-05-05 (×2): 20 meq via ORAL
  Filled 2021-05-05 (×2): qty 1

## 2021-05-05 NOTE — Progress Notes (Signed)
NAME:  Deborah Koch, MRN:  [id number], DOB:  30-Nov-1956, LOS: 3 ADMISSION DATE:  05/02/2021, CONSULTATION DATE:   05/02/21 REFERRING MD:  Colvin Caroli- ED, CHIEF COMPLAINT:  Hypoxia, SOB   History of Present Illness:  Deborah Koch is a 65 y/o woman with a history of bicuspid aortic valve s/p SAVR and aortic aneurysm repair on 04/21/21 who was discharged 5/11 who presents with complaints of pressure like chest pain and worsening SOB today after several days of feeling week. She had been feeling well prior to discharge, but her sister brought her to the ED for evaluation of her progressive symptoms of weakness, fatigue. She has scoliosis that causes chronic pain in her upper back, which is currently at baseline without new CP or back pain. No LE symptoms. She has not taken her antihypertensives at home today.  CT scan performed at [facility] demonstrated bilateral pleural effusions, loculated on the left, and pericardial effusion. TCTS has been consulted for evaluation and recommended a CTA, echo, and BP control. PCCM consulted for admission.  Pertinent  Medical History  Bicuspid aortic valve s/p SAVR and aortic aneurysm repair 04/21/21, discharged 5/11 Scoliosis GERD C diff (2015) Anxiety and depression  Significant Hospital Events: Including procedures, antibiotic start and stop dates in addition to other pertinent events    5/16 admission  BC Staph epidermidis in 1 of 4 bottles  BC 5/19>>  5/19 thoracentesis  Interim History / Subjective:  Agitation resolved. Patient denies any pain or shortness of breath. Overnight, no more Afib.  Objective   Blood pressure 121/63, pulse 66, temperature 98.5 F (36.9 C), temperature source Oral, resp. rate 17, height 5\' 2"  (1.575 m), weight 60.9 kg, SpO2 99 %.        Intake/Output Summary (Last 24 hours) at 05/05/2021 1042 Last data filed at 05/05/2021 0900 Gross per 24 hour  Intake 1372.26 ml  Output 825 ml  Net 547.26 ml   Filed Weights    05/03/21 0500 05/04/21 0500 05/05/21 0500  Weight: 62.2 kg 61.6 kg 60.9 kg    Examination: Gen:      Appears comfortable. No acute distress HEENT:  EOMI, sclera anicteri Lungs:    No respiratory distress.  clear to auscultation bilaterally; normal respiratory effort CV:         Regular rate and rhythm; 2/6 nolic murmurs  Abd:      + bowel sounds; soft, non-tender; no palpable masses, no distension Ext:    No edema; adequate peripheral perfusion Skin:       Warm and dry; no rash Neuro:     Alert and oriented. Answers questions appropriately. Cooperative with exam  Labs/imaging that I havepersonally reviewed  (right click and "Reselect all SmartList Selections" daily)  Hb 8.7<9.7<7.8< 9.2<8 , wbc 30 K 3.1 Na 131<132<127 [facility]  Resolved Hospital Problem list   Na Assessment & Plan:  Deborah Koch is a 65 y.o. female with hx of congential bicuspid aortic valve with development of subsequent severe aortic stenosis and poststenotic aortic dilatation, s/p recent (5-01/2021) bioprosthetic AVR/aortic root replacement who presents 5/16 with CP and acute dyspnea, concern for sepsis. Started on Vanc and Cefepime. BP was elevated and she started on been on IV CCB and transferred to ICU.  SIRS:  Possible Staph epidermidis bacteriemia: 1 of 4 BC bottle is positive for Staph epidermidis. Could be contamination and no evidence of infection but will continue antibiotic.  -CBC daily -Changing Cefepime to Encef -Repeat BC today  Acute respiratory failure  with hypoxemia: Might be 2/2 rt pleural effusion/rt lower lobe collapse>Now on 4Li HFNC. Rt pleural effusion:  Pleural effusions is probably related to previous surgery. IR consulted for R thora given recent chest tubes and chest instrumentation.  -Some improvement and she is now on 3-4 li HFNC. But CXR with no significant changes of pleural effusion. -Will do thoracentesis today -F/u pleural effusion labs  Pericardial effusion: (Possible  hemorrhagic): Hemopericardium likely 2/2 recent AVR surgery.  Possible perisplenic hematoma: Unclear etiology. Had a recent fall that could have caused some tramua. Will monitor. If Hb continue to drop may consider re imaging of abdomen if clinically relevant but stable for now.  Last echo 5/19. Stable w/o evidence of tamponade.   -Hb stable -Monitor Hb daily -Avoid anticoagulation -CT surgery and cardiology are on board. Continue conservative management.  Afib w RVR:  Now sinus Initially on IV Cleviprex>switched to Esmolol 5/18>Amiodaron5/18>Sinus since 5/19.  -DC Esmolol IV -Cardiology switched Amiodaron to PO. Appreciated their recommendations. -Decreased Lopressor to 50 mg BID given HR at 50s-60s  Agitated delirium: Resolved. Had some combativeness. Agitated delirium in setting of probable infection vs component of alcohol withdrawal.  -Continue Lexapro and Trazodon 50 mg QHS -Holding home clonazepam while on CIWA w Ativan -CIWA w ativan  Hypokalemia: -Replacing  Hyponatremia: Improved w Lasxix. Likely 2/2 hypervolemia vs SIADH. Na 131<132 128<127<126 on arrival.   AKI: Resolved  Best practice (right click and "Reselect all SmartList Selections" daily)  Diet:  Oral Pain/Anxiety/Delirium protocol (if indicated): No VAP protocol (if indicated): Not indicated DVT prophylaxis: SCD and Contraindicated GI prophylaxis: N/A Glucose control:   Central venous access:  Arterial line:  N/A Foley:  N/A Mobility:  bed rest  PT consulted: N/A Last date of multidisciplinary goals of care discussion [5/19. Daughter updated] Code Status:  full code Disposition: Stays in ICU  Labs   CBC: Recent Labs  Lab 05/02/21 0757 05/02/21 1539 05/02/21 2000 05/03/21 0127 05/03/21 0649 05/04/21 0013 05/04/21 1343 05/05/21 0129  WBC 32.9*   < > 31.4* 30.2*  --  22.4* 21.9* 20.6*  NEUTROABS 29.5*  --   --   --   --   --   --   --   HGB 8.7*   < > 8.4* 8.0* 9.2* 7.8* 9.7* 8.7*  HCT  26.7*   < > 24.4* 24.4* 27.0* 24.0* 30.1* 26.8*  MCV 92.7   < > 89.4 92.8  --  93.0 93.2 92.4  PLT 527*   < > 350 456*  --  475* 554* 476*   < > = values in this interval not displayed.    Basic Metabolic Panel: Recent Labs  Lab 05/02/21 0757 05/02/21 1253 05/02/21 1539 05/03/21 0127 05/03/21 0649 05/03/21 1315 05/04/21 0013 05/04/21 1343 05/05/21 0129  NA 124*   < > 126* 127* 128* 129* 132*  --  131*  K 5.2*   < > 4.9 5.4* 4.0 3.9 4.2  --  3.1*  CL 85*   < > 88* 90*  --  92* 95*  --  95*  CO2 26   < > 26 26  --  24 28  --  27  GLUCOSE 103*   < > 99 110*  --  96 96  --  129*  BUN 31*   < > 29* 25*  --  19 19  --  14  CREATININE 1.31*   < > 1.17* 0.92  --  0.86 0.94  --  0.81  CALCIUM 8.7*   < > 8.6* 8.7*  --  8.7* 8.5*  --  8.3*  MG 2.5*  --   --   --   --   --   --  2.0  --   PHOS  --   --   --   --   --   --   --  2.2*  --    < > = values in this interval not displayed.   GFR: Estimated Creatinine Clearance: 59.5 mL/min (by C-G formula based on SCr of 0.81 mg/dL). Recent Labs  Lab 05/02/21 1253 05/02/21 1347 05/02/21 1539 05/02/21 2000 05/02/21 2050 05/03/21 0127 05/03/21 0901 05/03/21 1315 05/04/21 0013 05/04/21 1343 05/05/21 0129  PROCALCITON 0.67  --   --   --   --  0.76  --   --  0.61  --   --   WBC  --   --  32.5*   < >  --  30.2*  --   --  22.4* 21.9* 20.6*  LATICACIDVEN  --    < > 2.6*  --  2.8*  --  2.2* 1.7  --   --   --    < > = values in this interval not displayed.    Liver Function Tests: No results for input(s): AST, ALT, ALKPHOS, BILITOT, PROT, ALBUMIN in the last 168 hours. No results for input(s): LIPASE, AMYLASE in the last 168 hours. No results for input(s): AMMONIA in the last 168 hours.  ABG    Component Value Date/Time   PHART 7.458 (H) 05/03/2021 0649   PCO2ART 36.3 05/03/2021 0649   PO2ART 63 (L) 05/03/2021 0649   HCO3 25.7 05/03/2021 0649   TCO2 27 05/03/2021 0649   ACIDBASEDEF 3.0 (H) 04/22/2021 0446   O2SAT 93.0 05/03/2021  0649     Coagulation Profile: Recent Labs  Lab 05/02/21 1253 05/03/21 0127  INR 1.6* 1.4*    Cardiac Enzymes: No results for input(s): CKTOTAL, CKMB, CKMBINDEX, TROPONINI in the last 168 hours.  HbA1C: Hgb A1c MFr Bld  Date/Time Value Ref Range Status  04/19/2021 02:40 PM 5.2 4.8 - 5.6 % Final    Comment:    (NOTE) Pre diabetes:          5.7%-6.4%  Diabetes:              >6.4%  Glycemic control for   <7.0% adults with diabetes   05/05/2020 11:57 AM 5.6 4.6 - 6.5 % Final    Comment:    Glycemic Control Guidelines for People with Diabetes:Non Diabetic:  <6%Goal of Therapy: <7%Additional Action Suggested:  >8%     CBG: Recent Labs  Lab 05/04/21 1508 05/04/21 1924 05/04/21 2317 05/05/21 0326 05/05/21 0706  GLUCAP 138* 168* 169* 124* 104*    Review of Systems:   DOE, weakness  Past Medical History:  She,  has a past medical history of Anxiety, C. difficile colitis, Congenital bicuspid aortic valve, Depression, Dilated Ascending Aorta, Diverticulitis, GERD (gastroesophageal reflux disease), H/O mastitis (2015), Heart murmur, History of tobacco abuse, HTN (hypertension), Hypokalemia, Moderate to severe aortic stenosis, Multilevel degenerative disc disease, Pneumonia, and Scoliosis.   Surgical History:   Past Surgical History:  Procedure Laterality Date  . AORTA -INNOMIATE BYPASS N/A 04/21/2021   Procedure: AORTA -[place] BYPASS;  Surgeon: Gaye Pollack, MD;  Location: [place];  Service: Vascular;  Laterality: N/A;  . AORTIC VALVE REPLACEMENT N/A 04/21/2021   Procedure: AORTIC VALVE REPLACEMENT (AVR) ON PUMP  WITH 23MM INSPIRIS AORTIC VALVE;  Surgeon: Gaye Pollack, MD;  Location: [place] [facility];  Service: Open Heart Surgery;  Laterality: N/A;  . BARTHOLIN GLAND CYST EXCISION    . BREAST BIOPSY Left    neg  . CAROTID-SUBCLAVIAN BYPASS GRAFT Left 04/21/2021   Procedure: AORTA LEFT COMMON CAROTID BYPASS;  Surgeon: Gaye Pollack, MD;  Location: [place] [facility];  Service: Vascular;   Laterality: Left;  . COLONOSCOPY  2013   Dr. Allen Norris  . COLONOSCOPY WITH PROPOFOL N/A 10/23/2017   Procedure: COLONOSCOPY WITH PROPOFOL;  Surgeon: Lucilla Lame, MD;  Location[facility] END[facility]COPY;  Service: Endoscopy;  Laterality: N/A;  . OOPHORECTOMY  2016  . OVARY SURGERY  2016   cyst on right ovary  . REPLACEMENT ASCENDING AORTA N/A 04/21/2021   Procedure: REPLACEMENT ASCENDING & ARCH AORTA WITH HEMASHIELD PLATINUM GRAFT 28 [id number];  Surgeon: Gaye Pollack, MD;  Location: [place];  Service: Open Heart Surgery;  Laterality: N/A;  CIRC ARREST  RIGHT AXILLARY ARTERY CANNULATION  . RIGHT/LEFT HEART CATH AND CORONARY ANGIOGRAPHY N/A 03/24/2021   Procedure: RIGHT/LEFT HEART CATH AND CORONARY ANGIOGRAPHY;  Surgeon: Minna Merritts, MD;  Location: [place] CV LAB;  Service: Cardiovascular;  Laterality: N/A;  . SALPINGECTOMY Bilateral   . TEE WITHOUT CARDIOVERSION N/A 04/21/2021   Procedure: TRANSESOPHAGEAL ECHOCARDIOGRAM (TEE);  Surgeon: Gaye Pollack, MD;  Location: [place];  Service: Open Heart Surgery;  Laterality: N/A;     Social History:   reports that she has been smoking cigarettes. She has a 5.00 pack-year smoking history. She has never used smokeless tobacco. She reports current alcohol use of about 12.0 standard drinks of alcohol per week. She reports that she does not use drugs.   Family History:  Her family history includes Alcohol abuse in her brother; Anxiety disorder in her mother and sister; Breast cancer in her maternal aunt; Colon cancer in her mother; Depression in her mother and sister; Heart failure in her father; Hypertension in her father and mother; Schizophrenia in her brother. There is no history of Heart attack or Stroke.   Allergies Allergies  Allergen Reactions  . Iodine Rash  . Vicodin [Hydrocodone-Acetaminophen] Hives and Rash     Home Medications  Prior to Admission medications   Medication Sig Start Date End Date Taking? Authorizing Provider  aspirin EC 325  MG EC tablet Take 1 tablet (325 mg total) by mouth daily. 04/27/21  Yes Roddenberry, Myron G, PA-C  clonazePAM (KLONOPIN) 0.5 MG tablet TAKE 1 TABLET BY MOUTH TWICE A DAY AS NEEDED Patient taking differently: Take 0.5 mg by mouth 2 (two) times daily. 03/11/21  Yes Burnard Hawthorne, FNP  escitalopram (LEXAPRO) 10 MG tablet Take 1 tablet (10 mg total) by mouth daily. Patient taking differently: Take 10 mg by mouth in the morning. 11/22/20  Yes Arnett, [name], FNP  lisinopril (ZESTRIL) 20 MG tablet Take 1 tablet (20 mg total) by mouth daily. Patient taking differently: Take 20 mg by mouth in the morning. 11/22/20  Yes Burnard Hawthorne, FNP  Metoprolol Tartrate 75 MG TABS Take 75 mg by mouth 2 (two) times daily. 04/27/21  Yes Roddenberry, Arlis Porta, PA-C  rosuvastatin (CRESTOR) 5 MG tablet Take 1 tablet (5 mg total) by mouth daily. Patient taking differently: Take 5 mg by mouth in the morning. 03/22/21 06/20/21 Yes Marrianne Mood D, PA-C     Critical care time: Dewayne Hatch, MD [id number] 05/05/2021, 10:42 AM Pager: [phone]

## 2021-05-05 NOTE — Procedures (Signed)
Thoracentesis Procedure Note  Pre-operative Diagnosis: Pleural effusion  Post-operative Diagnosis: same  Indications: Diagnostic evaluation of right pleural effusion  Procedure Details  Consent: Informed consent was obtained. Risks of the procedure were discussed including: infection, bleeding, pain, pneumothorax.  Under sterile conditions the patient was positioned. Betadine solution and sterile drapes were utilized.  1% buffered lidocaine was used to anesthetize the 5th rib space. Fluid was obtained without any difficulties and minimal blood loss.  A dressing was applied to the wound and wound care instructions were provided.   Findings 350 bloody ml of bloody pleural fluid was obtained. A sample was sent to Pathology for cytogenetics,  and cell counts, as well as for infection analysis.  Complications:  None; patient tolerated the procedure well.          Condition: stable  Plan A follow up chest x-ray was ordered. Bed Rest for 1 hours. Tylenol 650 mg. for pain.  Richardson Landry Delfin Squillace ACNP Acute Care Nurse Practitioner [facility] Pulmonary/Critical Care Please consult Amion 05/05/2021, 10:48 AM

## 2021-05-05 NOTE — Progress Notes (Signed)
  Speech Language Pathology Treatment: Dysphagia  Patient Details Name: Deborah Koch MRN: [id number] DOB: 1955/12/28 Today's Date: 05/05/2021 Time: 7017-7939 SLP Time Calculation (min) (ACUTE ONLY): 12.43 min  Assessment / Plan / Recommendation Clinical Impression  Pt was seen for dysphagia treatment and was cooperative throughout the session. Pt and Felicia, RN reported that the pt has been tolerating the current diet without overt s/sx of aspiration or any other difficulty swallowing. Pt tolerated regular texture solids, and thin liquids via straw using consecutive swallows without symptoms of oropharyngeal dysphagia. It is recommended that the current diet be continued. Further skilled SLP services are not clinically indicated at this time.    HPI HPI: Pt is a 65 y/o woman who presented to ED with complaints of pressure like chest pain and worsening SOB after several days of feeling weak, found to have acute respiratory failure with hypomexia. CT chest (05/02/21) revealed moderate pericardial effusion. She was admitted to ICU with concern of sepsis, started on broad spectrum antibiotic. Cardiology following for dyspnea. Chest xray (05/04/21) Stable examination with bilateral pleural effusions, right greater than left, and bibasilar atelectasis or infiltrate. Per RN note (05/02/21), pt passed 3 oz water test, but had difficulty swallowing meds, no coughing. PMH: HTN, tobacco abuse, anxiety, bicuspid aortic valve s/p SAVR and aortic aneurysm repair on 04/21/21 who was discharged 5/11.      SLP Plan  All goals met;Discharge SLP treatment due to (comment)       Recommendations  Diet recommendations: Regular;Thin liquid Liquids provided via: Cup;Straw Medication Administration: Whole meds with liquid Supervision: Patient able to self feed Compensations: Minimize environmental distractions;Slow rate;Small sips/bites                Oral Care Recommendations: Oral care BID Follow up  Recommendations: None SLP Visit Diagnosis: Dysphagia, unspecified (R13.10) Plan: All goals met;Discharge SLP treatment due to (comment)       Cadden Elizondo I. Hardin Negus, [place], [place] Office number [phone] Pager [place] 05/05/2021, 4:52 PM

## 2021-05-05 NOTE — Progress Notes (Signed)
K+ 3.1 °Replaced per protocol  °

## 2021-05-05 NOTE — Progress Notes (Signed)
Pharmacy Antibiotic Note  Deborah Koch is a 65 y.o. female with recent thoracic aortic aneurysm repair and AVR who presents 5/16 with CP, concern for sepsis. Pharmacy was originally consulted for Cefepime dosing, now narrowing to Cefazolin.  SCr has improved with hydration to 0.81 (BL around 0.6-0.7).  Team concerned that S.epi in 1/4 bottles is not contaminant given recent surgery.  Planning to repeat blood cultures to help.  WBC 30 >20. Tm 99.  Plan: - Discontinue cefepime - Start cefazolin 2g IV q8 hours - Will continue to follow renal function, culture results, LOT  Height: 5\' 2"  (157.5 cm) Weight: 60.9 kg (134 lb 4.2 oz) IBW/kg (Calculated) : 50.1  Temp (24hrs), Avg:98.5 F (36.9 C), Min:97.9 F (36.6 C), Max:99.1 F (37.3 C)  Recent Labs  Lab 05/02/21 1347 05/02/21 1539 05/02/21 2000 05/02/21 2050 05/03/21 0127 05/03/21 0901 05/03/21 1315 05/04/21 0013 05/04/21 1343 05/05/21 0129  WBC  --  32.5* 31.4*  --  30.2*  --   --  22.4* 21.9* 20.6*  CREATININE  --  1.17*  --   --  0.92  --  0.86 0.94  --  0.81  LATICACIDVEN 2.2* 2.6*  --  2.8*  --  2.2* 1.7  --   --   --     Estimated Creatinine Clearance: 59.5 mL/min (by C-G formula based on SCr of 0.81 mg/dL).    Allergies  Allergen Reactions  . Iodine Rash  . Vicodin [Hydrocodone-Acetaminophen] Hives and Rash    Antimicrobials this admission: Vancomycin 5/16 x 1 Cefepime 5/16 >>5/19 Cefazolin 5/19 >>   Microbiology results: 5/16 BCx >> S.epi 1/4 bottles (no resistance) 5/16 Resp panel >> neg  Thank you for allowing pharmacy to be a part of this patient's care.  Dimple Nanas, PharmD PGY-1 Acute Care Pharmacy Resident 05/05/2021 9:11 AM   **Pharmacist phone directory can now be found on [email or web address] ([email or web address]).  Listed under [place].

## 2021-05-05 NOTE — Progress Notes (Signed)
[facility] Progress Note Patient Name: Deborah Koch DOB: March 27, 1956 MRN: [id number]   Date of Service  05/05/2021  HPI/Events of Note  Patient c/o pain. Nursing request for Tylenol. However the patient is allergic to Vicodin (Hives and Rash). Vicodin contains Tylenol. Creatinine = 0.87.  eICU Interventions  Plan: 1. Motrin 400 mg PO X 1 now.     Intervention Category Major Interventions: Other:  Lysle Dingwall 05/05/2021, 11:44 PM

## 2021-05-05 NOTE — Progress Notes (Signed)
Progress Note  Patient Name: Deborah Koch Date of Encounter: 05/05/2021  [facility] Cardiologist: Ida Rogue, MD   Subjective   Feeling okay.  Calm this morning.  Still asking to go home.  Denies chest pain or shortness of breath.  Inpatient Medications    Scheduled Meds: . Chlorhexidine Gluconate Cloth  6 each Topical Daily  . escitalopram  10 mg Oral Daily  . mouth rinse  15 mL Mouth Rinse BID  . metoprolol tartrate  50 mg Oral BID  . traZODone  50 mg Oral QHS   Continuous Infusions: . sodium chloride 10 mL/hr at 05/05/21 0800  . amiodarone 30 mg/hr (05/05/21 0800)   PRN Meds: sodium chloride, clonazePAM, haloperidol lactate, ipratropium-albuterol, labetalol, LORazepam **OR** LORazepam, ondansetron (ZOFRAN) IV, polyethylene glycol   Vital Signs    Vitals:   05/05/21 0700 05/05/21 0739 05/05/21 0800 05/05/21 0909  BP: 129/64  128/64 121/63  Pulse: (!) 58  63 66  Resp: 14  18   Temp:  98.5 F (36.9 C)    TempSrc:  Oral    SpO2: 99%  97%   Weight:      Height:        Intake/Output Summary (Last 24 hours) at 05/05/2021 0918 Last data filed at 05/05/2021 0800 Gross per 24 hour  Intake 1356.99 ml  Output 825 ml  Net 531.99 ml   Last 3 Weights 05/05/2021 05/04/2021 05/03/2021  Weight (lbs) 134 lb 4.2 oz 135 lb 12.9 oz 137 lb 2 oz  Weight (kg) 60.9 kg 61.6 kg 62.2 kg  Some encounter information is confidential and restricted. Go to Review Flowsheets activity to see all data.      Telemetry    Atrial fibrillation converted to normal sinus rhythm yesterday at 3 PM, normal sinus rhythm since that time without significant arrhythmia - Personally Reviewed   Physical Exam  Alert, oriented, no distress GEN: No acute distress.   Neck: No JVD Cardiac: RRR, soft systolic ejection murmur at the right upper sternal border Respiratory:  Diminished breath sounds right lung base, otherwise clear GI: Soft, nontender, non-distended  MS: No edema; No  deformity. Neuro:  Nonfocal  Psych: Normal affect   Labs    High Sensitivity Troponin:   Recent Labs  Lab 05/02/21 0757 05/02/21 1033 05/02/21 1253 05/02/21 1539  TROPONINIHS 161* 162* 172* 150*      Chemistry Recent Labs  Lab 05/03/21 1315 05/04/21 0013 05/05/21 0129  NA 129* 132* 131*  K 3.9 4.2 3.1*  CL 92* 95* 95*  CO2 24 28 27   GLUCOSE 96 96 129*  BUN 19 19 14   CREATININE 0.86 0.94 0.81  CALCIUM 8.7* 8.5* 8.3*  GFRNONAA >60 >60 >60  ANIONGAP 13 9 9      Hematology Recent Labs  Lab 05/04/21 0013 05/04/21 1343 05/05/21 0129  WBC 22.4* 21.9* 20.6*  RBC 2.58* 3.23* 2.90*  HGB 7.8* 9.7* 8.7*  HCT 24.0* 30.1* 26.8*  MCV 93.0 93.2 92.4  MCH 30.2 30.0 30.0  MCHC 32.5 32.2 32.5  RDW 16.0* 16.2* 16.2*  PLT 475* 554* 476*    BNP Recent Labs  Lab 05/02/21 0757  BNP 823.2*     DDimer No results for input(s): DDIMER in the last 168 hours.   Radiology    DG Chest Port 1 View  Result Date: 05/05/2021 CLINICAL DATA:  Pleural effusion. EXAM: PORTABLE CHEST 1 VIEW COMPARISON:  05/04/2021. FINDINGS: Prior cardiac valve replacement. Severe cardiomegaly again noted. Diffuse bilateral pulmonary  infiltrates/edema. Bilateral pleural effusions. Findings suggest CHF. Bilateral pneumonia cannot be excluded. No pneumothorax. Severe thoracic spine scoliosis again noted. IMPRESSION: 1. Prior cardiac valve replacement. Severe cardiomegaly again noted. 2. Diffuse bilateral pulmonary infiltrates/edema. Bilateral pleural effusions noted. Findings suggest CHF. Electronically Signed   By: Marcello Moores  Register   On: 05/05/2021 05:23   DG Chest Port 1 View  Result Date: 05/04/2021 CLINICAL DATA:  Pleural effusion EXAM: PORTABLE CHEST 1 VIEW COMPARISON:  05/03/2021, CT 05/02/2021 FINDINGS: Pulmonary insufflation is preserved. Moderate right and small left pleural effusions are again identified with associated bibasilar atelectasis or infiltrate. Mild to moderate cardiomegaly is again  noted. Aortic valve replacement has been performed. Pulmonary vascularity is normal. Moderate thoracic dextroscoliosis again noted. IMPRESSION: Stable examination with bilateral pleural effusions, right greater than left, and bibasilar atelectasis or infiltrate. Stable mild-to-moderate cardiomegaly. Electronically Signed   By: Fidela Salisbury MD   On: 05/04/2021 07:07   DG CHEST PORT 1 VIEW  Result Date: 05/03/2021 CLINICAL DATA:  Pleural effusion, shortness of breath, chest pain EXAM: PORTABLE CHEST 1 VIEW COMPARISON:  05/02/2021 FINDINGS: Cardiomegaly. Prior median sternotomy and valve replacement. Layering bilateral effusions with bibasilar atelectasis or infiltrates. Findings are similar to prior study. IMPRESSION: No significant change. Electronically Signed   By: Rolm Baptise M.D.   On: 05/03/2021 10:06   ECHOCARDIOGRAM LIMITED  Result Date: 05/04/2021    ECHOCARDIOGRAM LIMITED REPORT   Patient Name:   Deborah Koch Date of Exam: 05/04/2021 Medical Rec #:  [id number]            Height:       62.0 in Accession #:    [id number]           Weight:       135.8 lb Date of Birth:  04/30/1956            BSA:          1.622 m Patient Age:    65 years             BP:           128/98 mmHg Patient Gender: F                    HR:           118 bpm. Exam Location:  Inpatient Procedure: Limited Echo, Limited Color Doppler and Cardiac Doppler STAT ECHO Indications:    Atrial Fibrillation I48.91  History:        Patient has prior history of Echocardiogram examinations, most                 recent 05/02/2021. Aortic Valve Disease, Arrythmias:Atrial                 Fibrillation, Signs/Symptoms:Murmur; Risk Factors:Hypertension.                 Bilateral pleural effusions, hemorrhagic pericardial effusion.                 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                 Aortic Valve: 23 mm Inspiris pericardial valve is present in the                 aortic position. Procedure Date: 04/21/2021.  Sonographer:    Darlina Sicilian RDCS Referring Phys: [id number] [place]  1. Limited study to evaluate moderate pericardial effusion. The pericardial effusion is circumferential. There  is no evidence of increase pericardial pressure, the there is IVC dilation. In RV focus view, RV has delayed expansion but not evidence of collapse. There is echodensity within the effusion best seen in the [facility] view.  2. The inferior vena cava is dilated in size with <50% respiratory variability, suggesting right atrial pressure of 15 mmHg.  3. Left ventricular ejection fraction, by estimation, is 70 to 75%. The left ventricle has hyperdynamic function. The left ventricle has no regional wall motion abnormalities.  4. Right ventricular systolic function is normal. The right ventricular size is normal.  5. Mild mitral valve regurgitation.  6. Aortic valve regurgitation is not visualized. There is a 23 mm Inspiris pericardial valve present in the aortic position. Procedure Date: 04/21/2021. Comparison(s): A prior study was performed on 05/02/21. Prior images reviewed side by side. Stable effusion; LV appears hyperdynamic. Hemorrhagic pericardial effusion is on differential given recent SAVR. FINDINGS  Left Ventricle: Left ventricular ejection fraction, by estimation, is 70 to 75%. The left ventricle has hyperdynamic function. The left ventricle has no regional wall motion abnormalities. Right Ventricle: The right ventricular size is normal. Right ventricular systolic function is normal. Pericardium: A moderately sized pericardial effusion is present. The pericardial effusion is circumferential. There is no evidence of cardiac tamponade. Mitral Valve: Mild mitral valve regurgitation. Aortic Valve: Aortic valve regurgitation is not visualized. Aortic valve mean gradient measures 7.0 mmHg. Aortic valve peak gradient measures 16.3 mmHg. There is a 23 mm Inspiris pericardial valve present in the aortic position. Procedure Date: 04/21/2021. Venous:  The inferior vena cava is dilated in size with less than 50% respiratory variability, suggesting right atrial pressure of 15 mmHg. AORTIC VALVE AV Vmax:           202.00 cm/s AV Vmean:          125.000 cm/s AV VTI:            0.312 m AV Peak Grad:      16.3 mmHg AV Mean Grad:      7.0 mmHg LVOT Vmax:         102.00 cm/s LVOT Vmean:        68.400 cm/s LVOT VTI:          0.110 m LVOT/AV VTI ratio: 0.35  SHUNTS Systemic VTI: 0.11 m Rudean Haskell MD Electronically signed by Rudean Haskell MD Signature Date/Time: 05/04/2021/8:26:34 AM    Final      Patient Profile     65 y.o. female with recent bioprosthetic aortic valve replacement, aortic root replacement, now rehospitalized with leukocytosis and hypoxemia with presumed sepsis.  She is noted to have a moderate pericardial effusion without signs of cardiac tamponade and paroxysmal atrial fibrillation with RVR.  Assessment & Plan    1.  Atrial fibrillation with RVR: No anticoagulation in setting of bleeding risk with pericardial effusion and subcapsular splenic hematoma.  Now maintaining sinus rhythm on IV amiodarone.  Continue IV amiodarone through current bag, then transition to amiodarone 400 mg twice daily.  2.  Moderate pericardial effusion, possibly hemorrhagic: Stable on serial echo studies without evidence of tamponade.  Anticipate follow-up echo for surveillance in approximately 1 week.  3.  Acute hypoxic respiratory failure: Plans noted for right thoracentesis at some point.  Management per CCM team.  4.  Severe aortic stenosis status post bioprosthetic aortic valve replacement and aortic root replacement: Normal function of her aortic bioprosthesis seen on postoperative echo studies.    For questions or updates, please contact  [facility] Please consult [email or web address] for contact info under        Signed, Sherren Mocha, MD  05/05/2021, 9:18 AM

## 2021-05-05 NOTE — Progress Notes (Addendum)
      [address]       [place] [address]             [phone]         Subjective: Late entry, Deborah Koch was seen at 07:20 this morning.  Awake and alert, oriented to place and situation.  Said she was feeling better and would like to go back home. Denied pain.   Objective: Vital signs in last 24 hours: Temp:  [97.6 F (36.4 C)-99.1 F (37.3 C)] 97.6 F (36.4 C) (05/19 1133) Pulse Rate:  [56-124] 56 (05/19 1100) Cardiac Rhythm: Normal sinus rhythm (05/19 0800) Resp:  [12-21] 19 (05/19 1100) BP: (94-142)/(54-96) 111/81 (05/19 1100) SpO2:  [83 %-100 %] 99 % (05/19 1100) Weight:  [60.9 kg] 60.9 kg (05/19 0500)     Intake/Output from previous day: 05/18 0701 - 05/19 0700 In: 1332.1 [I.V.:756.2; IV Piggyback:575.9] Out: 825 [Urine:825] Intake/Output this shift: Total I/O In: 324 [P.O.:120; I.V.:79.3; IV Piggyback:124.6] Out: -   General appearance: alert, cooperative and no distress Neurologic: intact Heart: RRR, no murmur, NSR on monitor Lungs: breath sounds clear anterior. Abdomen: soft and NT. Extremities: all warm and well perfused, no significant edema Wound: The sternotomy and right subclavian incisions are well healed with no evidence of complication.   Lab Results: Recent Labs    05/04/21 1343 05/05/21 0129  WBC 21.9* 20.6*  HGB 9.7* 8.7*  HCT 30.1* 26.8*  PLT 554* 476*   BMET:  Recent Labs    05/04/21 0013 05/05/21 0129  NA 132* 131*  K 4.2 3.1*  CL 95* 95*  CO2 28 27  GLUCOSE 96 129*  BUN 19 14  CREATININE 0.94 0.81  CALCIUM 8.5* 8.3*    PT/INR:  Recent Labs    05/03/21 0127  LABPROT 17.1*  INR 1.4*   ABG    Component Value Date/Time   PHART 7.458 (H) 05/03/2021 0649   HCO3 25.7 05/03/2021 0649   TCO2 27 05/03/2021 0649   ACIDBASEDEF 3.0 (H) 04/22/2021 0446   O2SAT 93.0 05/03/2021 0649   CBG (last 3)  Recent Labs    05/05/21 0326 05/05/21 0706 05/05/21 1101  GLUCAP 124* 104* 134*     Assessment/Plan:  -Re-admission following aortic valve replacement and repair of ascending aortic aneurysm earlier this month with bilateral pleural effusions, pericardial effusion, leukocytosis, weakness and fatigue. Also became anxious and confused following admission. She is calm and cooperative this morning and is feeling better in general.   -Leukocytosis- No clear source. WBC 30K -> 20.6K, 1/4 blood cx positive for staph epi. Primary team continuing ABX and repeating blood cx today.   -Bilateral pleural effusions- right thoracentesis today with 33ml removed. CXR improved.  Fluid studies and cx's pending.  Left thoracentesis is being considered for tomorrow.   -Pericardial effusion- repeat echo yesterday confirms mostly posterior component and no evidence of tamponade. Hct is stable.  No indication for surgical intervention. Agree with repeat echo in about 1 week.   -Atrial fibrillation- Back in SR on IV amio this morning, conversion to oral amio planned.   -CT surgery will continue to follow.    LOS: 3 days    Deborah Koch [phone] 05/05/2021   Chart reviewed, patient examined, agree with above. CXR after right thoracentesis shows significant improvement in right lung aeration. I doubt that there is enough fluid on the left to drain and it looks loculated.

## 2021-05-06 ENCOUNTER — Inpatient Hospital Stay (HOSPITAL_COMMUNITY): Payer: Medicare Other

## 2021-05-06 DIAGNOSIS — A419 Sepsis, unspecified organism: Secondary | ICD-10-CM | POA: Diagnosis not present

## 2021-05-06 DIAGNOSIS — I161 Hypertensive emergency: Secondary | ICD-10-CM | POA: Diagnosis not present

## 2021-05-06 LAB — CBC
HCT: 26.8 % — ABNORMAL LOW (ref 36.0–46.0)
Hemoglobin: 8.6 g/dL — ABNORMAL LOW (ref 12.0–15.0)
MCH: 30.1 pg (ref 26.0–34.0)
MCHC: 32.1 g/dL (ref 30.0–36.0)
MCV: 93.7 fL (ref 80.0–100.0)
Platelets: 476 10*3/uL — ABNORMAL HIGH (ref 150–400)
RBC: 2.86 MIL/uL — ABNORMAL LOW (ref 3.87–5.11)
RDW: 16.6 % — ABNORMAL HIGH (ref 11.5–15.5)
WBC: 19.7 10*3/uL — ABNORMAL HIGH (ref 4.0–10.5)
nRBC: 0.2 % (ref 0.0–0.2)

## 2021-05-06 LAB — BASIC METABOLIC PANEL
Anion gap: 6 (ref 5–15)
BUN: 9 mg/dL (ref 8–23)
CO2: 28 mmol/L (ref 22–32)
Calcium: 8.4 mg/dL — ABNORMAL LOW (ref 8.9–10.3)
Chloride: 97 mmol/L — ABNORMAL LOW (ref 98–111)
Creatinine, Ser: 0.7 mg/dL (ref 0.44–1.00)
GFR, Estimated: >60 mL/min (ref >60)
Glucose, Bld: 119 mg/dL — ABNORMAL HIGH (ref 70–99)
Potassium: 4 mmol/L (ref 3.5–5.1)
Sodium: 131 mmol/L — ABNORMAL LOW (ref 135–145)

## 2021-05-06 LAB — PH, BODY FLUID: pH, Body Fluid: 7.9

## 2021-05-06 LAB — CYTOLOGY - NON PAP

## 2021-05-06 LAB — GLUCOSE, CAPILLARY
Glucose-Capillary: 114 mg/dL — ABNORMAL HIGH (ref 70–99)
Glucose-Capillary: 109 mg/dL — ABNORMAL HIGH (ref 70–99)
Glucose-Capillary: 128 mg/dL — ABNORMAL HIGH (ref 70–99)
Glucose-Capillary: 125 mg/dL — ABNORMAL HIGH (ref 70–99)

## 2021-05-06 LAB — TRIGLYCERIDES, BODY FLUIDS: Triglycerides, Fluid: 20 mg/dL

## 2021-05-06 LAB — MAGNESIUM: Magnesium: 1.9 mg/dL (ref 1.7–2.4)

## 2021-05-06 MED ORDER — MAGNESIUM SULFATE 2 GM/50ML IV SOLN
2.0000 g | Freq: Once | INTRAVENOUS | Status: AC
  Administered 2021-05-06: 2 g via INTRAVENOUS

## 2021-05-06 MED ORDER — METOPROLOL TARTRATE 50 MG PO TABS
50.0000 mg | ORAL_TABLET | Freq: Two times a day (BID) | ORAL | Status: DC
  Administered 2021-05-06 – 2021-05-08 (×4): 50 mg via ORAL
  Filled 2021-05-06 (×4): qty 1

## 2021-05-06 MED ORDER — AMIODARONE IV BOLUS ONLY 150 MG/100ML
150.0000 mg | Freq: Once | INTRAVENOUS | Status: AC
  Administered 2021-05-06: 150 mg via INTRAVENOUS
  Filled 2021-05-06: qty 100

## 2021-05-06 MED ORDER — FUROSEMIDE 40 MG PO TABS
40.0000 mg | ORAL_TABLET | Freq: Every day | ORAL | Status: DC
  Administered 2021-05-06 – 2021-05-08 (×3): 40 mg via ORAL
  Filled 2021-05-06 (×3): qty 1

## 2021-05-06 MED ORDER — METOPROLOL TARTRATE 25 MG PO TABS
75.0000 mg | ORAL_TABLET | Freq: Two times a day (BID) | ORAL | Status: DC
  Administered 2021-05-06: 75 mg via ORAL
  Filled 2021-05-06 (×2): qty 3

## 2021-05-06 MED ORDER — ROSUVASTATIN CALCIUM 5 MG PO TABS
5.0000 mg | ORAL_TABLET | Freq: Every day | ORAL | Status: DC
  Administered 2021-05-06 – 2021-05-08 (×3): 5 mg via ORAL
  Filled 2021-05-06 (×3): qty 1

## 2021-05-06 MED ORDER — CLONAZEPAM 0.5 MG PO TABS
0.5000 mg | ORAL_TABLET | Freq: Two times a day (BID) | ORAL | Status: DC | PRN
  Administered 2021-05-06: 0.5 mg via ORAL
  Filled 2021-05-06: qty 1

## 2021-05-06 MED ORDER — THIAMINE HCL 100 MG PO TABS
100.0000 mg | ORAL_TABLET | Freq: Every day | ORAL | Status: DC
  Administered 2021-05-06 – 2021-05-08 (×3): 100 mg via ORAL
  Filled 2021-05-06 (×3): qty 1

## 2021-05-06 MED ORDER — FOLIC ACID 1 MG PO TABS
1.0000 mg | ORAL_TABLET | Freq: Every day | ORAL | Status: DC
  Administered 2021-05-06 – 2021-05-08 (×3): 1 mg via ORAL
  Filled 2021-05-06 (×3): qty 1

## 2021-05-06 NOTE — Progress Notes (Signed)
TCTS Subjective:  Feels much better.  Went back into atrial fib this am on oral amio. Rate low 100's.    Objective: Vital signs in last 24 hours: Temp:  [97.6 F (36.4 C)-98.5 F (36.9 C)] 98 F (36.7 C) (05/20 0700) Pulse Rate:  [56-119] 103 (05/20 1030) Cardiac Rhythm: Normal sinus rhythm (05/20 0800) Resp:  [16-23] 23 (05/20 1000) BP: (90-155)/(59-108) 110/80 (05/20 1030) SpO2:  [92 %-100 %] 93 % (05/20 0800) Weight:  [62.2 kg] 62.2 kg (05/20 0335)  Hemodynamic parameters for last 24 hours:    Intake/Output from previous day: 05/19 0701 - 05/20 0700 In: 1102.2 [P.O.:480; I.V.:297.4; IV Piggyback:324.8] Out: 1275 [Urine:1275] Intake/Output this shift: Total I/O In: 188.8 [I.V.:109.6; IV Piggyback:79.3] Out: 450 [Urine:450]  General appearance: alert and cooperative Neurologic: intact Heart: irregularly irregular rhythm Lungs: diminished breath sounds bibasilar Extremities: extremities normal, atraumatic, no cyanosis or edema Wound: incisions healing well.  Lab Results: Recent Labs    05/05/21 0129 05/06/21 0305  WBC 20.6* 19.7*  HGB 8.7* 8.6*  HCT 26.8* 26.8*  PLT 476* 476*   BMET:  Recent Labs    05/05/21 1512 05/06/21 0305  NA 132* 131*  K 3.9 4.0  CL 96* 97*  CO2 28 28  GLUCOSE 117* 119*  BUN 11 9  CREATININE 0.87 0.70  CALCIUM 8.5* 8.4*    PT/INR: No results for input(s): LABPROT, INR in the last 72 hours. ABG    Component Value Date/Time   PHART 7.458 (H) 05/03/2021 0649   HCO3 25.7 05/03/2021 0649   TCO2 27 05/03/2021 0649   ACIDBASEDEF 3.0 (H) 04/22/2021 0446   O2SAT 93.0 05/03/2021 0649   CBG (last 3)  Recent Labs    05/05/21 2303 05/06/21 0308 05/06/21 0733  GLUCAP 117* 114* 109*   Narrative & Impression  CLINICAL DATA:  Pleural effusion  EXAM: PORTABLE CHEST 1 VIEW  COMPARISON:  05/05/2021  FINDINGS: Moderate left pleural effusion and associated left basilar consolidation are unchanged. Right basilar  consolidation is stable. Interval development of a tiny right pleural effusion. No pneumothorax. Aortic valve replacement has been performed. Mild cardiomegaly is stable. Pulmonary vascularity is normal. Thoracic scoliosis is again noted.  IMPRESSION: Stable left basilar consolidation and associated small to moderate left pleural effusion.  Stable right basilar consolidation. Interval development of a tiny right pleural effusion.   Electronically Signed   By: Fidela Salisbury MD   On: 05/06/2021 05:30      Assessment/Plan:  She is hemodynamically stable but back in atrial fibrillation this am. Continue po amiodarone and will give he a bolus. She may need additional boluses to keep her in sinus until level builds up. Would hold off on anticoagulation with pericardial effusion that may be hemorrhagic.   Leukocytosis improving. Etiology unclear. 1/4 initial BC +, likely contaminant. Repeat BC negative so far. Continue antibiotic. This may be inflammatory instead of infectious.   Altered mental status much improved. She seems back to baseline to me. I would continue her Klonopin that she has been on for years for anxiety.   Pericardial effusion, moderate without hemodynamic compromise or tamponade. Observe. May be hemorrhagic from pericarditis but could just be bloody from extensive surgery.  Small loculated left pleural effusion. Observe. She has severe scoliosis which makes her CXR look odd.  She is transferring to [facility].   Work on [employer], ambulation.    LOS: 4 days    Gaye Pollack 05/06/2021

## 2021-05-06 NOTE — Progress Notes (Addendum)
Progress Note  Patient Name: Deborah Koch Date of Encounter: 05/06/2021  [facility] Cardiologist: Ida Rogue, MD   Subjective   Patient  Is laying in bed, pleasant. She states she feels much better. She reports mild chest discomfort and feel her heart was racing with flutter sensation earlier this morning. She is oriented to person/place/time, follows commands and carry conversation appropriately. She received a bolus of amiodarone 150mg  x1 this morning due to A Fib RVR. She denied any fever, chills, nausea, vomiting, abdominal pain, diarrhea. She is eating regularly.   Inpatient Medications    Scheduled Meds: . amiodarone  400 mg Oral BID  . Chlorhexidine Gluconate Cloth  6 each Topical Daily  . escitalopram  10 mg Oral Daily  . folic acid  1 mg Oral Daily  . furosemide  40 mg Oral Daily  . mouth rinse  15 mL Mouth Rinse BID  . metoprolol tartrate  50 mg Oral BID  . rosuvastatin  5 mg Oral Daily  . thiamine  100 mg Oral Daily  . traZODone  50 mg Oral QHS   Continuous Infusions: . sodium chloride Stopped (05/06/21 0936)  .  ceFAZolin (ANCEF) IV 200 mL/hr at 05/06/21 1000   PRN Meds: sodium chloride, clonazePAM, ipratropium-albuterol, labetalol, ondansetron (ZOFRAN) IV, polyethylene glycol   Vital Signs    Vitals:   05/06/21 0924 05/06/21 1000 05/06/21 1030 05/06/21 1100  BP: (!) 101/59 108/75 110/80 105/84  Pulse: (!) 116 99 (!) 103 100  Resp:  (!) 23    Temp:      TempSrc:      SpO2:      Weight:      Height:        Intake/Output Summary (Last 24 hours) at 05/06/2021 1110 Last data filed at 05/06/2021 1000 Gross per 24 hour  Intake 967.04 ml  Output 1325 ml  Net -357.96 ml   Last 3 Weights 05/06/2021 05/05/2021 05/04/2021  Weight (lbs) 137 lb 2 oz 134 lb 4.2 oz 135 lb 12.9 oz  Weight (kg) 62.2 kg 60.9 kg 61.6 kg  Some encounter information is confidential and restricted. Go to Review Flowsheets activity to see all data.      Telemetry     Afib with rate of 80s currently, converted from sinus bradycardia 40s to A fib with RVR up to 130s around 5:37 AM on 05/06/21 - Personally Reviewed  ECG    No new tracing today - Personally Reviewed  Physical Exam   GEN: No acute distress. Resting in bed. Calm and cooperative.  Neck: No JVD Cardiac: Irregularly irregular , no murmurs, rubs, or gallops.  Respiratory: Clear but diminished at base bilaterally, on room air. Speaks in full sentence  GI: Soft, nontender, non-distended  MS: No BLE edema; No deformity. Neuro:  Alert, oriented x3, moving all extremity spontaneously in bed Psych: calm   Labs    High Sensitivity Troponin:   Recent Labs  Lab 05/02/21 0757 05/02/21 1033 05/02/21 1253 05/02/21 1539  TROPONINIHS 161* 162* 172* 150*      Chemistry Recent Labs  Lab 05/05/21 0129 05/05/21 1247 05/05/21 1512 05/06/21 0305  NA 131*  --  132* 131*  K 3.1*  --  3.9 4.0  CL 95*  --  96* 97*  CO2 27  --  28 28  GLUCOSE 129*  --  117* 119*  BUN 14  --  11 9  CREATININE 0.81  --  0.87 0.70  CALCIUM 8.3*  --  8.5* 8.4*  PROT  --  5.8*  --   --   ALBUMIN  --  2.9*  --   --   GFRNONAA >60  --  >60 >60  ANIONGAP 9  --  8 6     Hematology Recent Labs  Lab 05/04/21 1343 05/05/21 0129 05/06/21 0305  WBC 21.9* 20.6* 19.7*  RBC 3.23* 2.90* 2.86*  HGB 9.7* 8.7* 8.6*  HCT 30.1* 26.8* 26.8*  MCV 93.2 92.4 93.7  MCH 30.0 30.0 30.1  MCHC 32.2 32.5 32.1  RDW 16.2* 16.2* 16.6*  PLT 554* 476* 476*    BNP Recent Labs  Lab 05/02/21 0757  BNP 823.2*     DDimer No results for input(s): DDIMER in the last 168 hours.   Radiology    DG Chest Port 1 View  Result Date: 05/06/2021 CLINICAL DATA:  Pleural effusion EXAM: PORTABLE CHEST 1 VIEW COMPARISON:  05/05/2021 FINDINGS: Moderate left pleural effusion and associated left basilar consolidation are unchanged. Right basilar consolidation is stable. Interval development of a tiny right pleural effusion. No pneumothorax.  Aortic valve replacement has been performed. Mild cardiomegaly is stable. Pulmonary vascularity is normal. Thoracic scoliosis is again noted. IMPRESSION: Stable left basilar consolidation and associated small to moderate left pleural effusion. Stable right basilar consolidation. Interval development of a tiny right pleural effusion. Electronically Signed   By: Fidela Salisbury MD   On: 05/06/2021 05:30   DG Chest Port 1 View  Result Date: 05/05/2021 CLINICAL DATA:  Status post thoracentesis EXAM: PORTABLE CHEST 1 VIEW COMPARISON:  May 05, 2021 study obtained earlier in the day FINDINGS: Right pleural effusion smaller after thoracentesis. No pneumothorax. There is a left pleural effusion with consolidation in the left lower lobe, stable. There is cardiomegaly with pulmonary vascularity normal. Patient is status post aortic valve replacement. There is aortic atherosclerosis. No adenopathy. There is thoracic dextroscoliosis. IMPRESSION: No pneumothorax. Right pleural effusion smaller after thoracentesis. Left pleural effusion with left lower lobe consolidation persists. Stable cardiomegaly with postoperative changes. Aortic Atherosclerosis (ICD10-I70.0). Electronically Signed   By: Lowella Grip III M.D.   On: 05/05/2021 11:36   DG Chest Port 1 View  Result Date: 05/05/2021 CLINICAL DATA:  Pleural effusion. EXAM: PORTABLE CHEST 1 VIEW COMPARISON:  05/04/2021. FINDINGS: Prior cardiac valve replacement. Severe cardiomegaly again noted. Diffuse bilateral pulmonary infiltrates/edema. Bilateral pleural effusions. Findings suggest CHF. Bilateral pneumonia cannot be excluded. No pneumothorax. Severe thoracic spine scoliosis again noted. IMPRESSION: 1. Prior cardiac valve replacement. Severe cardiomegaly again noted. 2. Diffuse bilateral pulmonary infiltrates/edema. Bilateral pleural effusions noted. Findings suggest CHF. Electronically Signed   By: Marcello Moores  Register   On: 05/05/2021 05:23    Cardiac Studies    Echo from 05/04/21:  1. Limited study to evaluate moderate pericardial effusion. The  pericardial effusion is circumferential. There is no evidence of increase  pericardial pressure, the there is IVC dilation. In RV focus view, RV has  delayed expansion but not evidence of  collapse. There is echodensity within the effusion best seen in the [facility]  view.  2. The inferior vena cava is dilated in size with <50% respiratory  variability, suggesting right atrial pressure of 15 mmHg.  3. Left ventricular ejection fraction, by estimation, is 70 to 75%. The  left ventricle has hyperdynamic function. The left ventricle has no  regional wall motion abnormalities.  4. Right ventricular systolic function is normal. The right ventricular  size is normal.  5. Mild mitral valve regurgitation.  6. Aortic valve  regurgitation is not visualized. There is a 23 mm  Inspiris pericardial valve present in the aortic position. Procedure Date:  04/21/2021.   Echo from 05/02/21:  1. Left ventricular ejection fraction, by estimation, is 65 to 70%. The  left ventricle has normal function. The left ventricle has no regional  wall motion abnormalities. Left ventricular diastolic parameters are  consistent with Grade I diastolic  dysfunction (impaired relaxation).  2. Right ventricular systolic function is normal. The right ventricular  size is normal.  3. Left atrial size was severely dilated.  4. Right atrial size was mildly dilated.  5. Moderate pericardial effusion surrounds heart with some consolidation  (? clot) Except for dilated IVC, effusion does not appear to be  hemodynamically compromising by echo criteria.. Moderate pericardial  effusion.  6. Mild mitral valve regurgitation.  7. TR is eccentric, directed toward interatrial septum.. Tricuspid valve  regurgitation is moderate.  8. S/p AVR with 23 mm Edwards pericaridal tissue valve (04/21/21) Valve  appears well seated. Peak and mean  gradients through the valve are 29 adn  12 mm Hg respectively Dimensionless index is 0.54.[name] The aortic valve has  been repaired/replaced. Aortic valve  regurgitation is not visualized.  9. The inferior vena cava is dilated in size with <50% respiratory  variability, suggesting right atrial pressure of 15 mmHg.    Patient Profile     Ms. Akridge is a 65 y/o woman with a history of HTN, tobacco abuse, anxiety, bicuspid aortic valve with severe aortic stenosis with a 4.7cm fusiform ascending aortic aneurysm s/p aortic valve replacement with a 23 mm Edwards bovine pericardial tissue valve  and replacement of ascending aorta and aortic arch with aorto-innominate and aorto-left carotid bypass utilizing a 28 x 10 x 8 x 8 x10 Hemashield graft on 04/21/21 by CTS Dr Cyndia Bent. She was discharged on 04/27/21 to home, returned to ER on 05/02/21 with nausea, SOB, chest pain, dizziness, and generalized weakness.  At admission, she was hypertensive with SBP 180s and hypoxic at ED. WBC 32900. Na 124. K 5.2. Cr 1.3 (from 0.78). A noncontrast CT scan of the chest showed a moderate sized pericardial effusion with density suggestive of hemopericardium.  Small bilateral pleural effusions. No thoracic aortic dissection. She was admitted to ICU with concern of sepsis, started on broad spectrum antibiotic. Cardiology is consulted and following for dyspnea.   Echo from 5/16 showed EF 65-70%. No RWMA. Grade I DD. RV normal. LA severely dilated. Moderate pericardiac effusion surrounds heart with some consolidation concern for clot, IVC dilated, effusion does not appear to be hemodynamically compromising. Moderate TR. S/p AVR with 23 mm Edwards pericaridal tissue valve (04/21/21) Valve appears well seated. Peak and mean gradients through the valve are 29 and 12 mm Hg respectively Dimensionless index is 0.54    Assessment & Plan    Pericardial effusion  - noted on CT and Echo, possible hemorrhagic, no Tamponade per Echo or  clinically  - conservative management recommended in setting of no tachycardia, hypotension, or apparent hemodynamic sequelae, serial Echo has been stable  - repeat Echo 05/04/21 showed EF 70-75%, no RWMA,  the pericardial effusion appears stable  - repeat Echo in 1 week   Paroxysmal atrial fib with RVR - unable start anticoagulation due to questionable hemorrhagic pericardial effusion and possible splenic subcapsular hematoma per CT - did not respond to Cardizem bolus and Esmolol and digoxin, converted to NSR on IV Amiodarone 05/05/21 , transition to amiodarone 400mg  BID  - noted conversion  back to Afib RVR this morning at 5:37 AM, was symptomatic, rate control achieved after IV bolus of amiodarone 150mg  x1, current remains in A fib rate of 80s  - trend electrolytes, keep K >4 and Mag >2   Bicuspid aortic valve with severe aortic stenosis  4.7cm fusiform ascending aortic aneurysm  - s/p aortic valve replacement with a 23 mm Edwards bovine pericardial tissue valve  and replacement of ascending aorta and aortic arch with aorto-innominate and aorto-left carotid bypass utilizing a 28 x 10 x 8 x 8 x10 Hemashield graft on 04/21/21 by CTS Dr Cyndia Bent. - Normal function of her aortic bioprosthesis seen on postoperative echo studies - CT surgery following   Hypertension - BP is controlled at this time, home meds lisinopril held at this time, tolerating amiodarone and metoprolol   Bilateral pleural effusion - R tap appears exudative , follow culture an gram statin   Presumed sepsis - leukocytosis, lactic acidosis, hypoxia, systematic symptoms POA  - Blood culture 1 with 5/16 + for staphylococcus epidermidis, repeat BCx 5/19 NTD - currently on cefazolin  - unclear source, consider ID consult  - managed per ICU    Acute encephalopathy , improved  Acute hypoxic respiratory failure , resolved  Hyponatremia Anemia  - management per ICU      For questions or updates, please contact [facility] Please consult [email or web address] for contact info under        Signed, Margie Billet, NP  05/06/2021, 11:10 AM    Patient seen, examined. Available data reviewed. Agree with findings, assessment, and plan as outlined by Margie Billet, NP.  The patient is independently interviewed and examined.  She is alert, oriented, in no distress.  HEENT is normal, JVP is normal, lung fields are clear, heart is regular rate and rhythm with a 2/6 ejection murmur at the right upper sternal border, abdomen is soft and nontender, extremities have no edema.  Telemetry is reviewed and shows recurrent atrial fibrillation with RVR, now converted back to sinus rhythm since about noon today.  She converted with a bolus of amiodarone and is taking oral amiodarone 400 mg twice daily.  Her mental status is greatly improved and she seems to be making positive progress.  As previously documented, I think anticoagulation is contraindicated because of postoperative pericardial effusion and splenic subcapsular hematoma.  Continue amiodarone 400 mg twice daily for 2 weeks, then 200 mg daily.  Sherren Mocha, M.D. 05/06/2021 5:35 PM

## 2021-05-06 NOTE — Progress Notes (Signed)
NAME:  Deborah Koch, MRN:  [id number], DOB:  11-22-1956, LOS: 4 ADMISSION DATE:  05/02/2021, CONSULTATION DATE:   05/02/21 REFERRING MD:  Deborah Koch- ED, CHIEF COMPLAINT:  Hypoxia, SOB   History of Present Illness:  Deborah Koch is a 65 y/o woman with a history of bicuspid aortic valve s/p SAVR and aortic aneurysm repair on 04/21/21 who was discharged 5/11 who presents with complaints of pressure like chest pain and worsening SOB today after several days of feeling week. She had been feeling well prior to discharge, but her sister brought her to the ED for evaluation of her progressive symptoms of weakness, fatigue. She has scoliosis that causes chronic pain in her upper back, which is currently at baseline without new CP or back pain. No LE symptoms. She has not taken her antihypertensives at home today.  CT scan performed at [facility] demonstrated bilateral pleural effusions, loculated on the left, and pericardial effusion. TCTS has been consulted for evaluation and recommended a CTA, echo, and BP control. PCCM consulted for admission.  Pertinent  Medical History  Bicuspid aortic valve s/p SAVR and aortic aneurysm repair 04/21/21, discharged 5/11 Scoliosis GERD C diff (2015) Anxiety and depression  Significant Hospital Events: Including procedures, antibiotic start and stop dates in addition to other pertinent events    5/16 admission  BC Staph epidermidis in 1 of 4 bottles  BC 5/19>>  5/19 thoracentesis  Interim History / Subjective:  Still slightly confused but seems to be doing better. HR are a bit high.  Objective   Blood pressure (!) 126/108, pulse (!) 117, temperature 98 F (36.7 C), temperature source Oral, resp. rate 20, height 5\' 2"  (1.575 m), weight 62.2 kg, SpO2 93 %.        Intake/Output Summary (Last 24 hours) at 05/06/2021 3536 Last data filed at 05/06/2021 1443 Gross per 24 hour  Intake 906.69 ml  Output 1725 ml  Net -818.31 ml   Filed Weights   05/04/21 0500  05/05/21 0500 05/06/21 0335  Weight: 61.6 kg 60.9 kg 62.2 kg    Examination: Constitutional: no acute distress  Eyes: EOMI, pupils equal Ears, nose, mouth, and throat: MMM, trachea midline Cardiovascular: tachycardic, irregular, ext warm Respiratory: diminished at bases, L chest US reveals mostly just the pericardial effusion and minimal pleural fluid Gastrointestinal: soft, +BS Skin: No rashes, normal turgor Neurologic: moves all 4 ext to command Psychiatric: a bit confused but better than previous notes   Labs/imaging that I havepersonally reviewed  (right click and "Reselect all SmartList Selections" daily)  pleural effusion looks benign Wbc elevated, stable H/h stable Chemistries with stable hyponatremia  Resolved Hospital Problem list   Na Assessment & Plan:  Deborah Koch is a 65 y.o. female with hx of congential bicuspid aortic valve with development of subsequent severe aortic stenosis and poststenotic aortic dilatation, s/p recent (5-01/2021) bioprosthetic AVR/aortic root replacement who presents 5/16 with CP and acute dyspnea, concern for sepsis. Started on Vanc and Cefepime. BP was elevated and she started on been on IV CCB and transferred to ICU.   Possible Staph epidermidis bacteriemia vs. More likely contaminant Reactive leukocytosis - On cephazolin - Repeat cultures neg; would dc abx if neg x 24h  Acute respiratory failure with hypoxemia: resolved Rt pleural effusion: drained Lt pleural effusion: not clinically significant - On room air, encourage IS and mobility  Pericardial effusion: (Possible hemorrhagic): Hemopericardium likely 2/2 recent AVR surgery.  Possible perisplenic hematoma: Unclear etiology. Had a recent fall that could  have caused some tramua. Will monitor. If Hb continue to drop may consider re imaging of abdomen if clinically relevant but stable for now. Afib/RVR- recurrent Question if just postop pericardial and pleural  irritation/inflammation. - Cardiology and TCTS managing: redosing amio today - Consider increasing beta blocker - AC on hold indefinitely for now   Agitated delirium:Resolved. Alcohol and benzo use at home. - Continue Lexapro and Trazodon 50 mg QHS - Resume home klonipin - Fine to hold further CIWA - Thiamine/folate  Hyponatremia: suspect reactive to inflammation (deranged ADH axis response) AKI: Resolved - Start daily lasix - Trend, I/O - If persistent as OP consider DC lexapro as SSRI associated with SIADH  Deconditioning- PT consult pending  Stable for transfer out of ICU to progressive, appreciate TRH taking over care starting 5/21  Deborah Emery MD PCCM

## 2021-05-06 NOTE — Evaluation (Signed)
Physical Therapy Evaluation Patient Details Name: Deborah Koch MRN: [id number] DOB: [birth year] Today's Date: 05/06/2021   History of Present Illness  Deborah Koch is a 65 y/o woman who was admitted on 5/16 with SOB, nausea, and weakness.  noted to be hypertensive with SBP 180s and hypoxic in ED. A noncontrast CT scan of the chest showed a moderate sized pericardial effusion with density suggestive of hemopericardium.  Small bilateral pleural effusions.  Pt work up for sepsis.  PMH:  HTN, tobacco abuse, anxiety, s/p aortic valve replacementand replacement of ascending aorta and aortic arch on 04/21/21 by Dr Cyndia Bent. She was recently discharged on 04/27/21 after AVR/AAA repair  Clinical Impression  Pt admitted with above diagnosis. Pt was able to transfer to 3N1 and then to recliner with min guard assist with HHA of 1. Pt appears steady but pt declined to walk more today. Pt VSS during treatment. Pt states her sister or daughter is going to be with her when she goes home. Pt should progress well and be able to go home with HHPT and may need RW.  Will follow acutely.  Pt currently with functional limitations due to the deficits listed below (see PT Problem List). Pt will benefit from skilled PT to increase their independence and safety with mobility to allow discharge to the venue listed below.      Follow Up Recommendations Home health PT    Equipment Recommendations  Rolling walker with 5" wheels;3in1 (PT)    Recommendations for Other Services       Precautions / Restrictions Precautions Precautions: Fall Restrictions Weight Bearing Restrictions: No      Mobility  Bed Mobility Overal bed mobility: Independent                  Transfers Overall transfer level: Needs assistance Equipment used: 1 person hand held assist Transfers: Sit to/from Stand;Stand Pivot Transfers Sit to Stand: Min guard;Min assist Stand pivot transfers: Min guard;Min assist       General transfer  comment: Pt needed HHa to pivot to 3N1.  Pt thought she urinated but she didnt.  Assisted pt to recliner from 3N1 as she just wanted to get to chair and declined walking further thana  few steps.  Ambulation/Gait Ambulation/Gait assistance: Min guard Gait Distance (Feet): 4 Feet Assistive device: 1 person hand held assist Gait Pattern/deviations: Step-through pattern;Decreased stride length   Gait velocity interpretation: <1.31 ft/sec, indicative of household ambulator General Gait Details: Steady on feet for short distance.  Stairs            Wheelchair Mobility    Modified Rankin (Stroke Patients Only)       Balance Overall balance assessment: Needs assistance Sitting-balance support: No upper extremity supported;Feet supported Sitting balance-Leahy Scale: Fair     Standing balance support: Single extremity supported;During functional activity Standing balance-Leahy Scale: Fair Standing balance comment: stands statically with min gaurd assist.                             Pertinent Vitals/Pain Pain Assessment: No/denies pain    Home Living Family/patient expects to be discharged to:: Private residence Living Arrangements: Alone Available Help at Discharge: Family;Available 24 hours/day (sister and daughter) Type of Home: House Home Access: Stairs to enter (If pt goes to daughters)   Technical brewer of Steps: 1 Home Layout: Two level Home Equipment: None Additional Comments: Walked 1/2 mile day, drove, just retired, going through stress  of divorce and just moved into apartment    Prior Function Level of Independence: Independent         Comments: if pt goes to her apartment it is one level with no stairs     Hand Dominance   Dominant Hand: Right    Extremity/Trunk Assessment   Upper Extremity Assessment Upper Extremity Assessment: Defer to OT evaluation    Lower Extremity Assessment Lower Extremity Assessment: Generalized  weakness    Cervical / Trunk Assessment Cervical / Trunk Assessment: Normal  Communication   Communication: No difficulties  Cognition Arousal/Alertness: Awake/alert Behavior During Therapy: WFL for tasks assessed/performed Overall Cognitive Status: Within Functional Limits for tasks assessed                                        General Comments General comments (skin integrity, edema, etc.): VSS - did not push pt as nurse states pt had converted to afib today. Pt HR and other VSS during treatment. Pt was on RA    Exercises General Exercises - Lower Extremity Ankle Circles/Pumps: AROM;Both;10 reps;Seated Long Arc Quad: AROM;Both;10 reps;Seated Hip Flexion/Marching: AROM;Both;10 reps;Seated   Assessment/Plan    PT Assessment Patient needs continued PT services  PT Problem List Decreased activity tolerance;Decreased balance;Decreased mobility;Decreased knowledge of use of DME;Decreased safety awareness;Decreased knowledge of precautions       PT Treatment Interventions DME instruction;Gait training;Functional mobility training;Therapeutic activities;Therapeutic exercise;Balance training;Patient/family education;Stair training    PT Goals (Current goals can be found in the Care Plan section)  Acute Rehab PT Goals Patient Stated Goal: to go home with daughter or sister PT Goal Formulation: With patient Time For Goal Achievement: 05/20/21 Potential to Achieve Goals: Good    Frequency Min 3X/week   Barriers to discharge        Co-evaluation               AM-PAC PT "6 Clicks" Mobility  Outcome Measure Help needed turning from your back to your side while in a flat bed without using bedrails?: None Help needed moving from lying on your back to sitting on the side of a flat bed without using bedrails?: None Help needed moving to and from a bed to a chair (including a wheelchair)?: A Little Help needed standing up from a chair using your arms (e.g.,  wheelchair or bedside chair)?: A Little Help needed to walk in hospital room?: A Little Help needed climbing 3-5 steps with a railing? : A Little 6 Click Score: 20    End of Session Equipment Utilized During Treatment: Gait belt Activity Tolerance: Patient tolerated treatment well Patient left: in chair;with call bell/phone within reach;with chair alarm set Nurse Communication: Mobility status PT Visit Diagnosis: Muscle weakness (generalized) (M62.81)    Time:  -      Charges:              Tequisha Maahs M,PT Acute Rehab Services [phone] (pager)  Alvira Philips 05/06/2021, 3:57 PM

## 2021-05-07 DIAGNOSIS — R072 Precordial pain: Secondary | ICD-10-CM

## 2021-05-07 DIAGNOSIS — I4819 Other persistent atrial fibrillation: Secondary | ICD-10-CM

## 2021-05-07 LAB — BASIC METABOLIC PANEL
Anion gap: 8 (ref 5–15)
BUN: 8 mg/dL (ref 8–23)
CO2: 28 mmol/L (ref 22–32)
Calcium: 8.6 mg/dL — ABNORMAL LOW (ref 8.9–10.3)
Chloride: 97 mmol/L — ABNORMAL LOW (ref 98–111)
Creatinine, Ser: 0.69 mg/dL (ref 0.44–1.00)
GFR, Estimated: >60 mL/min (ref >60)
Glucose, Bld: 120 mg/dL — ABNORMAL HIGH (ref 70–99)
Potassium: 3.8 mmol/L (ref 3.5–5.1)
Sodium: 133 mmol/L — ABNORMAL LOW (ref 135–145)

## 2021-05-07 LAB — CBC
HCT: 27.5 % — ABNORMAL LOW (ref 36.0–46.0)
Hemoglobin: 9 g/dL — ABNORMAL LOW (ref 12.0–15.0)
MCH: 30.5 pg (ref 26.0–34.0)
MCHC: 32.7 g/dL (ref 30.0–36.0)
MCV: 93.2 fL (ref 80.0–100.0)
Platelets: 471 10*3/uL — ABNORMAL HIGH (ref 150–400)
RBC: 2.95 MIL/uL — ABNORMAL LOW (ref 3.87–5.11)
RDW: 16.9 % — ABNORMAL HIGH (ref 11.5–15.5)
WBC: 19.2 10*3/uL — ABNORMAL HIGH (ref 4.0–10.5)
nRBC: 0 % (ref 0.0–0.2)

## 2021-05-07 LAB — PHOSPHORUS: Phosphorus: 2.7 mg/dL (ref 2.5–4.6)

## 2021-05-07 LAB — MAGNESIUM: Magnesium: 2.3 mg/dL (ref 1.7–2.4)

## 2021-05-07 MED ORDER — AMIODARONE HCL 200 MG PO TABS: 200.0000 mg | ORAL_TABLET | Freq: Every day | ORAL | Status: DC

## 2021-05-07 MED ORDER — AMIODARONE HCL 200 MG PO TABS
400.0000 mg | ORAL_TABLET | Freq: Two times a day (BID) | ORAL | Status: DC
  Administered 2021-05-07 – 2021-05-08 (×2): 400 mg via ORAL
  Filled 2021-05-07 (×2): qty 2

## 2021-05-07 NOTE — Progress Notes (Signed)
Cardiology Progress Note  Patient ID: Deborah Koch MRN: [id number] DOB: 1956/10/11 Date of Encounter: 05/07/2021  Primary Cardiologist: Ida Rogue, MD  Subjective   Chief Complaint: None.  HPI: Maintaining sinus rhythm.  Reports she is weak and fatigued.  ROS:  All other ROS reviewed and negative. Pertinent positives noted in the HPI.     Inpatient Medications  Scheduled Meds: . amiodarone  400 mg Oral BID  . Chlorhexidine Gluconate Cloth  6 each Topical Daily  . escitalopram  10 mg Oral Daily  . folic acid  1 mg Oral Daily  . furosemide  40 mg Oral Daily  . mouth rinse  15 mL Mouth Rinse BID  . metoprolol tartrate  50 mg Oral BID  . rosuvastatin  5 mg Oral Daily  . thiamine  100 mg Oral Daily  . traZODone  50 mg Oral QHS   Continuous Infusions: . sodium chloride 10 mL/hr at 05/06/21 1600  .  ceFAZolin (ANCEF) IV 2 g (05/07/21 0317)   PRN Meds: sodium chloride, clonazePAM, ipratropium-albuterol, labetalol, ondansetron (ZOFRAN) IV, polyethylene glycol   Vital Signs   Vitals:   05/06/21 1957 05/06/21 2329 05/07/21 0623 05/07/21 0913  BP: 136/86 138/79 (!) 154/89 138/76  Pulse: 69 66 74 83  Resp: 15 14 14 18   Temp: 99.4 F (37.4 C) 98.9 F (37.2 C) 98.6 F (37 C) 98.2 F (36.8 C)  TempSrc: Oral Oral Oral Oral  SpO2: 91% 90% 100% 93%  Weight:    62.1 kg  Height:        Intake/Output Summary (Last 24 hours) at 05/07/2021 1145 Last data filed at 05/07/2021 1000 Gross per 24 hour  Intake 727.98 ml  Output --  Net 727.98 ml   Last 3 Weights 05/07/2021 05/06/2021 05/05/2021  Weight (lbs) 137 lb 137 lb 2 oz 134 lb 4.2 oz  Weight (kg) 62.143 kg 62.2 kg 60.9 kg  Some encounter information is confidential and restricted. Go to Review Flowsheets activity to see all data.      Telemetry  Overnight telemetry shows sinus rhythm in 70s, which I personally reviewed.    Physical Exam   Vitals:   05/06/21 1957 05/06/21 2329 05/07/21 0623 05/07/21 0913  BP:  136/86 138/79 (!) 154/89 138/76  Pulse: 69 66 74 83  Resp: 15 14 14 18   Temp: 99.4 F (37.4 C) 98.9 F (37.2 C) 98.6 F (37 C) 98.2 F (36.8 C)  TempSrc: Oral Oral Oral Oral  SpO2: 91% 90% 100% 93%  Weight:    62.1 kg  Height:         Intake/Output Summary (Last 24 hours) at 05/07/2021 1145 Last data filed at 05/07/2021 1000 Gross per 24 hour  Intake 727.98 ml  Output --  Net 727.98 ml    Last 3 Weights 05/07/2021 05/06/2021 05/05/2021  Weight (lbs) 137 lb 137 lb 2 oz 134 lb 4.2 oz  Weight (kg) 62.143 kg 62.2 kg 60.9 kg  Some encounter information is confidential and restricted. Go to Review Flowsheets activity to see all data.    Body mass index is 25.06 kg/m.  General: Well nourished, well developed, in no acute distress Head: Atraumatic, normal size  Eyes: PEERLA, EOMI  Neck: Supple, JVD 10-12 cmH2O Endocrine: No thryomegaly Cardiac: Normal S1, S2; RRR; no murmurs, rubs, or gallops Lungs: Diminished breath sounds bilaterally Abd: Soft, nontender, no hepatomegaly  Ext: No edema, pulses 2+ Musculoskeletal: No deformities, BUE and BLE strength normal and equal Skin: Warm and  dry, no rashes   Neuro: Alert and oriented to person, place, time, and situation, CNII-XII grossly intact, no focal deficits  Psych: Normal mood and affect   Labs  High Sensitivity Troponin:   Recent Labs  Lab 05/02/21 0757 05/02/21 1033 05/02/21 1253 05/02/21 1539  TROPONINIHS 161* 162* 172* 150*     Cardiac EnzymesNo results for input(s): TROPONINI in the last 168 hours. No results for input(s): TROPIPOC in the last 168 hours.  Chemistry Recent Labs  Lab 05/05/21 1247 05/05/21 1512 05/06/21 0305 05/07/21 0341  NA  --  132* 131* 133*  K  --  3.9 4.0 3.8  CL  --  96* 97* 97*  CO2  --  28 28 28   GLUCOSE  --  117* 119* 120*  BUN  --  11 9 8   CREATININE  --  0.87 0.70 0.69  CALCIUM  --  8.5* 8.4* 8.6*  PROT 5.8*  --   --   --   ALBUMIN 2.9*  --   --   --   GFRNONAA  --  >60 >60 >60   ANIONGAP  --  8 6 8     Hematology Recent Labs  Lab 05/05/21 0129 05/06/21 0305 05/07/21 0341  WBC 20.6* 19.7* 19.2*  RBC 2.90* 2.86* 2.95*  HGB 8.7* 8.6* 9.0*  HCT 26.8* 26.8* 27.5*  MCV 92.4 93.7 93.2  MCH 30.0 30.1 30.5  MCHC 32.5 32.1 32.7  RDW 16.2* 16.6* 16.9*  PLT 476* 476* 471*   BNP Recent Labs  Lab 05/02/21 0757  BNP 823.2*    DDimer No results for input(s): DDIMER in the last 168 hours.   Radiology  DG Chest Port 1 View  Result Date: 05/06/2021 CLINICAL DATA:  Pleural effusion EXAM: PORTABLE CHEST 1 VIEW COMPARISON:  05/05/2021 FINDINGS: Moderate left pleural effusion and associated left basilar consolidation are unchanged. Right basilar consolidation is stable. Interval development of a tiny right pleural effusion. No pneumothorax. Aortic valve replacement has been performed. Mild cardiomegaly is stable. Pulmonary vascularity is normal. Thoracic scoliosis is again noted. IMPRESSION: Stable left basilar consolidation and associated small to moderate left pleural effusion. Stable right basilar consolidation. Interval development of a tiny right pleural effusion. Electronically Signed   By: Fidela Salisbury MD   On: 05/06/2021 05:30    Cardiac Studies  TTE 05/04/2021 1. Limited study to evaluate moderate pericardial effusion. The  pericardial effusion is circumferential. There is no evidence of increase  pericardial pressure, the there is IVC dilation. In RV focus view, RV has  delayed expansion but not evidence of  collapse. There is echodensity within the effusion best seen in the [facility]  view.  2. The inferior vena cava is dilated in size with <50% respiratory  variability, suggesting right atrial pressure of 15 mmHg.  3. Left ventricular ejection fraction, by estimation, is 70 to 75%. The  left ventricle has hyperdynamic function. The left ventricle has no  regional wall motion abnormalities.  4. Right ventricular systolic function is normal. The right  ventricular  size is normal.  5. Mild mitral valve regurgitation.  6. Aortic valve regurgitation is not visualized. There is a 23 mm  Inspiris pericardial valve present in the aortic position. Procedure Date:  04/21/2021.   Patient Profile  Deborah Koch is a 65 y.o. female with bicuspid aortic valve with severe aortic stenosis and ascending aortic aneurysm status post aortic valve replacement with 23 mm tissue valve and replacement of the ascending aorta on 04/21/2021.  She was discharged on 04/27/2021.  She represented to [facility] on 05/02/2021 for sepsis, pericardial effusion and pleural effusions.  Assessment & Plan   1.  Pericardial effusion status post aortic valve replacement and ascending aortic repair -Moderate circumferential pericardial effusion with dilated IVC.  She hemodynamically is stable.  I do have concerns about leaving the effusion.  However she has been evaluated by cardiac surgery and will remain. -She does have noticeable JVD.  Lower extremities without edema. -It appears conservative management was recommended.  She will need a follow-up echocardiogram in 1 to 2 weeks postdischarge. -Given concerns for hemopericardium she is not a candidate for anticoagulation. -We will continue Lasix 40 mg daily.  2.  Paroxysmal atrial fibrillation -Has had issues with A. fib during this admission. -Team last week has decided on amiodarone for rhythm control. -Continue 40 mg twice daily.  She will do this for 7 days and then 200 mg daily. -No anticoagulation in setting of pericardial effusion status post cardiac surgery.  3.  Aortic valve replacement with ascending aortic repair -Stable on echo.  4.  Bilateral pleural effusion -Exudative effusion on data from right thoracentesis. -On antibiotics.  [facility] will sign off.   Medication Recommendations: Recommendations for A. fib management as above. Other recommendations (labs, testing, etc): None. Follow  up as an outpatient: She has a follow-up on Monday.  We will send him a message to move this back 1 to 2 weeks.  For questions or updates, please contact [place] Please consult [email or web address] for contact info under   Time Spent with Patient: I have spent a total of 25 minutes with patient reviewing hospital notes, telemetry, EKGs, labs and examining the patient as well as establishing an assessment and plan that was discussed with the patient.  > 50% of time was spent in direct patient care.    Signed, Addison Naegeli. Audie Box, MD, [place]  05/07/2021 11:45 AM

## 2021-05-07 NOTE — Evaluation (Signed)
Occupational Therapy Evaluation Patient Details Name: Deborah Koch MRN: [id number] DOB: 1956/02/01 Today's Date: 05/07/2021    History of Present Illness [name] is a 65 y/o woman who was admitted on 5/16 with SOB, nausea, and weakness.  noted to be hypertensive with SBP 180s and hypoxic in ED. A noncontrast CT scan of the chest showed a moderate sized pericardial effusion with density suggestive of hemopericardium.  Small bilateral pleural effusions.  Pt work up for sepsis.  PMH:  HTN, tobacco abuse, anxiety, s/p aortic valve replacementand replacement of ascending aorta and aortic arch on 04/21/21 by Dr Cyndia Bent. She was recently discharged on 04/27/21 after AVR/AAA repair   Clinical Impression   PTA patient was living alone in an apartment and was grossly I with ADLs/IADLs without AD. Patient currently functioning slightly below baseline demonstrating observed ADLs including grooming standing at sink level and LB dressing with supervision A for safety. Patient also limited by deficits listed below including generalized weakness, mild balance deficits, and decreased activity tolerance and would benefit from continued acute OT services in prep for safe d/c home with family. Patient will likely progress well and will not require follow-up OT services post d/c.      Follow Up Recommendations  No OT follow up;Supervision - Intermittent    Equipment Recommendations  None recommended by OT    Recommendations for Other Services       Precautions / Restrictions Precautions Precautions: Fall Restrictions Weight Bearing Restrictions: No      Mobility Bed Mobility Overal bed mobility: Independent                  Transfers Overall transfer level: Needs assistance Equipment used: None Transfers: Sit to/from Stand;Stand Pivot Transfers Sit to Stand: Supervision         General transfer comment: Supervision A for sit to stand from various surfaces without AD. Patient able to  get on and off standing scale to obtain weight with supervision A.    Balance Overall balance assessment: Needs assistance Sitting-balance support: No upper extremity supported;Feet supported Sitting balance-Leahy Scale: Good     Standing balance support: Single extremity supported;During functional activity Standing balance-Leahy Scale: Fair Standing balance comment: Able to complete grooming tasks standing at sink level with supervision A for safety without AD.                           ADL either performed or assessed with clinical judgement   ADL Overall ADL's : Needs assistance/impaired     Grooming: Supervision/safety;Standing Grooming Details (indicate cue type and reason): Supervision A for safety.             Lower Body Dressing: Supervision/safety;Sit to/from stand Lower Body Dressing Details (indicate cue type and reason): Supervision A for safety. Toilet Transfer: Copy Details (indicate cue type and reason): Simulated with transfer to recliner without AD and supervision A for safety.         Functional mobility during ADLs: Supervision/safety       Vision   Vision Assessment?: No apparent visual deficits     Perception     Praxis      Pertinent Vitals/Pain Pain Assessment: 0-10 Pain Score: 3  Pain Location: chest near incision Pain Descriptors / Indicators: Sore Pain Intervention(s): Monitored during session     Hand Dominance Right   Extremity/Trunk Assessment Upper Extremity Assessment Upper Extremity Assessment: Generalized weakness   Lower Extremity Assessment Lower  Extremity Assessment: Generalized weakness   Cervical / Trunk Assessment Cervical / Trunk Assessment: Normal   Communication Communication Communication: No difficulties   Cognition Arousal/Alertness: Awake/alert Behavior During Therapy: WFL for tasks assessed/performed Overall Cognitive Status: Within Functional Limits for  tasks assessed                                     General Comments       Exercises     Shoulder Instructions      Home Living Family/patient expects to be discharged to:: Private residence Living Arrangements: Alone Available Help at Discharge: Family;Available 24 hours/day (sister and daughter) Type of Home: House Home Access: Stairs to enter (If pt goes to daughters) Technical brewer of Steps: 1   Home Layout: Two level Alternate Level Stairs-Number of Steps: 12   Bathroom Shower/Tub: Occupational psychologist: Standard     Home Equipment: None   Additional Comments: Walked 1/2 mile day, drove, just retired, going through stress of divorce and just moved into apartment      Prior Functioning/Environment Level of Independence: Independent        Comments: if pt goes to her apartment it is one level with no stairs        OT Problem List: Decreased strength      OT Treatment/Interventions: Self-care/ADL training;Therapeutic exercise;Therapeutic activities;Patient/family education    OT Goals(Current goals can be found in the care plan section) Acute Rehab OT Goals Patient Stated Goal: to go home with daughter or sister OT Goal Formulation: With patient Time For Goal Achievement: 05/21/21 Potential to Achieve Goals: Good ADL Goals Additional ADL Goal #1: Patient will complete ADLs with I and no AD. Additional ADL Goal #2: Patient will recall 3 energy conservation techniques in prep for ADLs. Additional ADL Goal #3: Patient will return demo BUE HEP independently with use of written handout.  OT Frequency: Min 2X/week   Barriers to D/C:            Co-evaluation              AM-PAC OT "6 Clicks" Daily Activity     Outcome Measure Help from another person eating meals?: None Help from another person taking care of personal grooming?: A Little Help from another person toileting, which includes using toliet, bedpan, or  urinal?: A Little Help from another person bathing (including washing, rinsing, drying)?: A Little Help from another person to put on and taking off regular upper body clothing?: None Help from another person to put on and taking off regular lower body clothing?: A Little 6 Click Score: 20   End of Session Equipment Utilized During Treatment: Gait belt Nurse Communication: Mobility status  Activity Tolerance: Patient tolerated treatment well Patient left: in chair;with call bell/phone within reach  OT Visit Diagnosis: Muscle weakness (generalized) (M62.81)                Time: 1287-8676 OT Time Calculation (min): 20 min Charges:  OT General Charges $OT Visit: 1 Visit OT Evaluation $OT Eval Low Complexity: 1 Low  Aldric Wenzler H. OTR/L Supplemental OT, Department of rehab services [phone]  Marya Lowden R H. 05/07/2021, 10:41 AM

## 2021-05-07 NOTE — Progress Notes (Signed)
TCTS Subjective:  Says this is her best day yet since surgery. Feels well. Ambulating, eating. No shortness of breath.  Objective: Vital signs in last 24 hours: Temp:  [98.2 F (36.8 C)-99.4 F (37.4 C)] 98.2 F (36.8 C) (05/21 0913) Pulse Rate:  [66-83] 71 (05/21 1233) Cardiac Rhythm: Normal sinus rhythm (05/21 0715) Resp:  [14-22] 20 (05/21 1233) BP: (116-157)/(68-89) 150/84 (05/21 1233) SpO2:  [90 %-100 %] 100 % (05/21 1233) Weight:  [62.1 kg] 62.1 kg (05/21 0913)  Hemodynamic parameters for last 24 hours:    Intake/Output from previous day: 05/20 0701 - 05/21 0700 In: 498 [P.O.:230; I.V.:168; IV Piggyback:100.1] Out: 450 [Urine:450] Intake/Output this shift: Total I/O In: 568 [P.O.:568] Out: -   General appearance: alert and cooperative Neurologic: intact Heart: regular rate and rhythm, S1, S2 normal, no murmur Lungs: diminished breath sounds bibasilar Extremities: no edema Wound: incisions well healed.  Lab Results: Recent Labs    05/06/21 0305 05/07/21 0341  WBC 19.7* 19.2*  HGB 8.6* 9.0*  HCT 26.8* 27.5*  PLT 476* 471*   BMET:  Recent Labs    05/06/21 0305 05/07/21 0341  NA 131* 133*  K 4.0 3.8  CL 97* 97*  CO2 28 28  GLUCOSE 119* 120*  BUN 9 8  CREATININE 0.70 0.69  CALCIUM 8.4* 8.6*    PT/INR: No results for input(s): LABPROT, INR in the last 72 hours. ABG    Component Value Date/Time   PHART 7.458 (H) 05/03/2021 0649   HCO3 25.7 05/03/2021 0649   TCO2 27 05/03/2021 0649   ACIDBASEDEF 3.0 (H) 04/22/2021 0446   O2SAT 93.0 05/03/2021 0649   CBG (last 3)  Recent Labs    05/06/21 0733 05/06/21 1232 05/06/21 1530  GLUCAP 109* 128* 125*    Assessment/Plan:  She is hemodynamically stable in sinus rhythm on oral amiodarone for atrial fib. No anticoagulation with possible hemorrhagic pericardial effusion.  WBC stable at 19.2 with no other signs of infection on Ancef. It is not clear if she had sepsis or not. Leukocytosis could be  related to Dressler's syndrome. Her only positive culture was 1/4 blood cultures that was most likely contaminant.   Moderate pericardial effusion with no signs of tamponade or hemodynamic instability. I would not recommend surgical drainage at this time. She should have a followup echo in a couple weeks.  Bilateral pleural effusions, right drained, left small and loculated. She is asymptomatic.   She looks very good overall so I doubt there is anything serious going on at this time.   LOS: 5 days    Deborah Koch 05/07/2021

## 2021-05-07 NOTE — Progress Notes (Signed)
PROGRESS NOTE    Deborah Koch  XBM:[id number] DOB: 09-May-1956 DOA: 05/02/2021 PCP: Burnard Hawthorne, FNP    Brief Narrative:  [phone] with hx bicuspid AV s/p SAVR and aneurysm repair on 5/5 who presented with sob initial concerns for sepsis. Admitted to ICU service initially  Assessment & Plan:   Active Problems:   Sepsis due to undetermined organism [facility])   Hypertensive emergency  Possible Staph epidermidis bacteriemia vs. More likely contaminant Reactive leukocytosis - Continued on IV cephazolin -Per PCCM, likely d/c abx if cultures remain neg in 24hrs. Thus far, cultures are neg.  Acute respiratory failure with hypoxemia: resolved Rt pleural effusion: drained Lt pleural effusion: not clinically significant - Currently on room air  Pericardial effusion: (Possible hemorrhagic):  -Suspected to have hemopericardium likely 2/2 recent AVR surgery.   Possible perisplenic hematoma: Uncertain etiology. Hgb has remained stable -Will recheck CBC in AM  Afib/RVR- recurrent - Cardiology and TCTS managing: currently in sinus, stable - Cardiology has signed off - [facility] remains on hold for now   Agitated delirium: - Alcohol and benzo use at home. - Continue Lexapro and Trazodon 50 mg QHS - Continued on home klonipin - Fine to hold further CIWA - Thiamine/folate  Hyponatremia: -Improving, labs reviewed  AKI:  -Resolved - continue on lasix  Deconditioning-  -Seen by PT  DVT prophylaxis: SCD's Code Status: Full Family Communication: Pt in room, family not at bedside  Status is: Inpatient  Remains inpatient appropriate because:Inpatient level of care appropriate due to severity of illness   Dispo: The patient is from: Home              Anticipated d/c is to: Home              Patient currently is not medically stable to d/c.   Difficult to place patient No    Consultants:   PCCM  CT Surgery  Cardiology  Procedures:      Antimicrobials: Anti-infectives (From admission, onward)   Start     Dose/Rate Route Frequency Ordered Stop   05/05/21 1030  ceFAZolin (ANCEF) IVPB 2g/100 mL premix        2 g 200 mL/hr over 30 Minutes Intravenous Every 8 hours 05/05/21 0930     05/03/21 1100  vancomycin (VANCOREADY) IVPB 750 mg/150 mL  Status:  Discontinued        750 mg 150 mL/hr over 60 Minutes Intravenous Every 24 hours 05/02/21 1806 05/03/21 0712   05/03/21 1100  vancomycin (VANCOREADY) IVPB 1000 mg/200 mL  Status:  Discontinued        1,000 mg 200 mL/hr over 60 Minutes Intravenous Every 24 hours 05/03/21 0712 05/03/21 0922   05/03/21 1000  ceFEPIme (MAXIPIME) 2 g in sodium chloride 0.9 % 100 mL IVPB  Status:  Discontinued        2 g 200 mL/hr over 30 Minutes Intravenous Every 12 hours 05/03/21 0714 05/05/21 0910   05/02/21 2200  ceFEPIme (MAXIPIME) 1 g in sodium chloride 0.9 % 100 mL IVPB  Status:  Discontinued        1 g 200 mL/hr over 30 Minutes Intravenous Every 12 hours 05/02/21 1032 05/03/21 0714   05/02/21 1015  ceFEPIme (MAXIPIME) 2 g in sodium chloride 0.9 % 100 mL IVPB        2 g 200 mL/hr over 30 Minutes Intravenous  Once 05/02/21 1009 05/02/21 1112   05/02/21 1000  vancomycin (VANCOCIN) 1,188 mg in sodium chloride 0.9 % 500  mL IVPB  Status:  Discontinued        20 mg/kg  59.4 kg 250 mL/hr over 120 Minutes Intravenous  Once 05/02/21 0951 05/02/21 1009   05/02/21 1000  vancomycin (VANCOREADY) IVPB 1000 mg/200 mL        1,000 mg 200 mL/hr over 60 Minutes Intravenous  Once 05/02/21 0954 05/02/21 1245       Subjective: Eager to go home  Objective: Vitals:   05/07/21 0623 05/07/21 0913 05/07/21 1233 05/07/21 1547  BP: (!) 154/89 138/76 (!) 150/84 (!) 144/75  Pulse: 74 83 71   Resp: 14 18 20 16   Temp: 98.6 F (37 C) 98.2 F (36.8 C)  98.6 F (37 C)  TempSrc: Oral Oral  Oral  SpO2: 100% 93% 100% 95%  Weight:  62.1 kg    Height:        Intake/Output Summary (Last 24 hours) at  05/07/2021 1805 Last data filed at 05/07/2021 1000 Gross per 24 hour  Intake 568 ml  Output --  Net 568 ml   Filed Weights   05/05/21 0500 05/06/21 0335 05/07/21 0913  Weight: 60.9 kg 62.2 kg 62.1 kg    Examination: General exam: Awake, laying in bed, in nad Respiratory system: Normal respiratory effort, no wheezing Cardiovascular system: regular rate, s1, s2 Gastrointestinal system: Soft, nondistended, positive BS Central nervous system: CN2-12 grossly intact, strength intact Extremities: Perfused, no clubbing Skin: Normal skin turgor, no notable skin lesions seen Psychiatry: Mood normal // no visual hallucinations   Data Reviewed: I have personally reviewed following labs and imaging studies  CBC: Recent Labs  Lab 05/02/21 0757 05/02/21 1539 05/04/21 0013 05/04/21 1343 05/05/21 0129 05/06/21 0305 05/07/21 0341  WBC 32.9*   < > 22.4* 21.9* 20.6* 19.7* 19.2*  NEUTROABS 29.5*  --   --   --   --   --   --   HGB 8.7*   < > 7.8* 9.7* 8.7* 8.6* 9.0*  HCT 26.7*   < > 24.0* 30.1* 26.8* 26.8* 27.5*  MCV 92.7   < > 93.0 93.2 92.4 93.7 93.2  PLT 527*   < > 475* 554* 476* 476* 471*   < > = values in this interval not displayed.   Basic Metabolic Panel: Recent Labs  Lab 05/02/21 0757 05/02/21 1253 05/04/21 0013 05/04/21 1343 05/05/21 0129 05/05/21 1512 05/06/21 0305 05/07/21 0341  NA 124*   < > 132*  --  131* 132* 131* 133*  K 5.2*   < > 4.2  --  3.1* 3.9 4.0 3.8  CL 85*   < > 95*  --  95* 96* 97* 97*  CO2 26   < > 28  --  27 28 28 28   GLUCOSE 103*   < > 96  --  129* 117* 119* 120*  BUN 31*   < > 19  --  14 11 9 8   CREATININE 1.31*   < > 0.94  --  0.81 0.87 0.70 0.69  CALCIUM 8.7*   < > 8.5*  --  8.3* 8.5* 8.4* 8.6*  MG 2.5*  --   --  2.0  --  2.0 1.9 2.3  PHOS  --   --   --  2.2*  --   --   --  2.7   < > = values in this interval not displayed.   GFR: Estimated Creatinine Clearance: 60.8 mL/min (by C-G formula based on SCr of 0.69 mg/dL). Liver Function  Tests: Recent  Labs  Lab 05/05/21 1247  PROT 5.8*  ALBUMIN 2.9*   No results for input(s): LIPASE, AMYLASE in the last 168 hours. No results for input(s): AMMONIA in the last 168 hours. Coagulation Profile: Recent Labs  Lab 05/02/21 1253 05/03/21 0127  INR 1.6* 1.4*   Cardiac Enzymes: No results for input(s): CKTOTAL, CKMB, CKMBINDEX, TROPONINI in the last 168 hours. BNP (last 3 results) No results for input(s): PROBNP in the last 8760 hours. HbA1C: No results for input(s): HGBA1C in the last 72 hours. CBG: Recent Labs  Lab 05/05/21 2303 05/06/21 0308 05/06/21 0733 05/06/21 1232 05/06/21 1530  GLUCAP 117* 114* 109* 128* 125*   Lipid Profile: Recent Labs    05/05/21 1247  CHOL 85   Thyroid Function Tests: No results for input(s): TSH, T4TOTAL, FREET4, T3FREE, THYROIDAB in the last 72 hours. Anemia Panel: No results for input(s): VITAMINB12, FOLATE, FERRITIN, TIBC, IRON, RETICCTPCT in the last 72 hours. Sepsis Labs: Recent Labs  Lab 05/02/21 1253 05/02/21 1347 05/02/21 1539 05/02/21 2050 05/03/21 0127 05/03/21 0901 05/03/21 1315 05/04/21 0013  PROCALCITON 0.67  --   --   --  0.76  --   --  0.61  LATICACIDVEN  --    < > 2.6* 2.8*  --  2.2* 1.7  --    < > = values in this interval not displayed.    Recent Results (from the past 240 hour(s))  Blood culture (routine x 2)     Status: None (Preliminary result)   Collection Time: 05/02/21  7:57 AM   Specimen: BLOOD  Result Value Ref Range Status   Specimen Description BLOOD SITE NOT SPECIFIED  Final   Special Requests   Final    BOTTLES DRAWN AEROBIC AND ANAEROBIC Blood Culture adequate volume   Culture   Final    NO GROWTH 4 DAYS Performed at [facility], [address] [place], [place] [address]    Report Status PENDING  Incomplete  Blood culture (routine x 2)     Status: Abnormal   Collection Time: 05/02/21  8:07 AM   Specimen: BLOOD  Result Value Ref Range Status   Specimen Description  BLOOD SITE NOT SPECIFIED  Final   Special Requests   Final    BOTTLES DRAWN AEROBIC AND ANAEROBIC Blood Culture adequate volume   Culture  Setup Time   Final    GRAM POSITIVE COCCI IN CLUSTERS AEROBIC BOTTLE ONLY CRITICAL RESULT CALLED TO, READ BACK BY AND VERIFIED WITH: Ardelle BallsG BARR PHARMD 1414 05/03/21 A BROWNING    Culture (A)  Final    STAPHYLOCOCCUS EPIDERMIDIS THE SIGNIFICANCE OF ISOLATING THIS ORGANISM FROM A SINGLE SET OF BLOOD CULTURES WHEN MULTIPLE SETS ARE DRAWN IS UNCERTAIN. PLEASE NOTIFY THE MICROBIOLOGY DEPARTMENT WITHIN ONE WEEK IF SPECIATION AND SENSITIVITIES ARE REQUIRED. Performed at [facility], [address] [place], [place] [address]    Report Status 05/05/2021 FINAL  Final  Blood Culture ID Panel (Reflexed)     Status: Abnormal   Collection Time: 05/02/21  8:07 AM  Result Value Ref Range Status   Enterococcus faecalis NOT DETECTED NOT DETECTED Final   Enterococcus Faecium NOT DETECTED NOT DETECTED Final   Listeria monocytogenes NOT DETECTED NOT DETECTED Final   Staphylococcus species DETECTED (A) NOT DETECTED Final    Comment: CRITICAL RESULT CALLED TO, READ BACK BY AND VERIFIED WITHArdelle Balls: G BARR PHARMD 1414 05/03/21 A BROWNING    Staphylococcus aureus (BCID) NOT DETECTED NOT DETECTED Final  Staphylococcus epidermidis DETECTED (A) NOT DETECTED Final    Comment: CRITICAL RESULT CALLED TO, READ BACK BY AND VERIFIED WITH: Rockey Situ PHARMD 1414 05/03/21 A BROWNING    Staphylococcus lugdunensis NOT DETECTED NOT DETECTED Final   Streptococcus species NOT DETECTED NOT DETECTED Final   Streptococcus agalactiae NOT DETECTED NOT DETECTED Final   Streptococcus pneumoniae NOT DETECTED NOT DETECTED Final   Streptococcus pyogenes NOT DETECTED NOT DETECTED Final   A.calcoaceticus-baumannii NOT DETECTED NOT DETECTED Final   Bacteroides fragilis NOT DETECTED NOT DETECTED Final   Enterobacterales NOT DETECTED NOT DETECTED Final   Enterobacter cloacae complex NOT DETECTED NOT  DETECTED Final   Escherichia coli NOT DETECTED NOT DETECTED Final   Klebsiella aerogenes NOT DETECTED NOT DETECTED Final   Klebsiella oxytoca NOT DETECTED NOT DETECTED Final   Klebsiella pneumoniae NOT DETECTED NOT DETECTED Final   Proteus species NOT DETECTED NOT DETECTED Final   Salmonella species NOT DETECTED NOT DETECTED Final   Serratia marcescens NOT DETECTED NOT DETECTED Final   Haemophilus influenzae NOT DETECTED NOT DETECTED Final   Neisseria meningitidis NOT DETECTED NOT DETECTED Final   Pseudomonas aeruginosa NOT DETECTED NOT DETECTED Final   Stenotrophomonas maltophilia NOT DETECTED NOT DETECTED Final   Candida albicans NOT DETECTED NOT DETECTED Final   Candida auris NOT DETECTED NOT DETECTED Final   Candida glabrata NOT DETECTED NOT DETECTED Final   Candida krusei NOT DETECTED NOT DETECTED Final   Candida parapsilosis NOT DETECTED NOT DETECTED Final   Candida tropicalis NOT DETECTED NOT DETECTED Final   Cryptococcus neoformans/gattii NOT DETECTED NOT DETECTED Final   Methicillin resistance mecA/C NOT DETECTED NOT DETECTED Final    Comment: Performed at [facility], [address] [place], [place] [address]  Resp Panel by RT-PCR (Flu A&B, Covid)     Status: None   Collection Time: 05/02/21  5:53 PM   Specimen: Nasopharyngeal(NP) swabs in vial transport medium  Result Value Ref Range Status   SARS Coronavirus 2 by RT PCR NEGATIVE NEGATIVE Final    Comment: (NOTE) SARS-CoV-2 target nucleic acids are NOT DETECTED.  The SARS-CoV-2 RNA is generally detectable in upper respiratory specimens during the acute phase of infection. The lowest concentration of SARS-CoV-2 viral copies this assay can detect is 138 copies/mL. A negative result does not preclude SARS-Cov-2 infection and should not be used as the sole basis for treatment or other patient management decisions. A negative result may occur with  improper specimen collection/handling, submission of specimen  other than nasopharyngeal swab, presence of viral mutation(s) within the areas targeted by this assay, and inadequate number of viral copies(<138 copies/mL). A negative result must be combined with clinical observations, patient history, and epidemiological information. The expected result is Negative.  Fact Sheet for Patients:  EntrepreneurPulse.com.au  Fact Sheet for Healthcare Providers:  IncredibleEmployment.be  This test is no t yet approved or cleared by the [place] FDA and  has been authorized for detection and/or diagnosis of SARS-CoV-2 by FDA under an Emergency Use Authorization (EUA). This EUA will remain  in effect (meaning this test can be used) for the duration of the COVID-19 declaration under Section 564(b)(1) of the Act, 21 U.S.C.section 360bbb-3(b)(1), unless the authorization is terminated  or revoked sooner.       Influenza A by PCR NEGATIVE NEGATIVE Final   Influenza B by PCR NEGATIVE NEGATIVE Final    Comment: (NOTE) The Xpert Xpress SARS-CoV-2/FLU/RSV plus assay is intended as an aid in the diagnosis of influenza from  Nasopharyngeal swab specimens and should not be used as a sole basis for treatment. Nasal washings and aspirates are unacceptable for Xpert Xpress SARS-CoV-2/FLU/RSV testing.  Fact Sheet for Patients: EntrepreneurPulse.com.au  Fact Sheet for Healthcare Providers: IncredibleEmployment.be  This test is not yet approved or cleared by the [place] FDA and has been authorized for detection and/or diagnosis of SARS-CoV-2 by FDA under an Emergency Use Authorization (EUA). This EUA will remain in effect (meaning this test can be used) for the duration of the COVID-19 declaration under Section 564(b)(1) of the Act, 21 U.S.C. section 360bbb-3(b)(1), unless the authorization is terminated or revoked.  Performed at [facility], [place] [address], [place],  [place] [address]   Body fluid culture w Gram Stain     Status: None (Preliminary result)   Collection Time: 05/05/21 10:51 AM   Specimen: Pleura; Body Fluid  Result Value Ref Range Status   Specimen Description PLEURAL FLUID  Final   Special Requests NONE  Final   Gram Stain   Final    FEW WBC PRESENT, PREDOMINANTLY PMN NO ORGANISMS SEEN    Culture   Final    NO GROWTH 2 DAYS Performed at [facility], [address], [place], [place] [address]    Report Status PENDING  Incomplete  Culture, blood (single)     Status: None (Preliminary result)   Collection Time: 05/05/21 12:47 PM   Specimen: BLOOD RIGHT HAND  Result Value Ref Range Status   Specimen Description BLOOD RIGHT HAND  Final   Special Requests   Final    BOTTLES DRAWN AEROBIC AND ANAEROBIC Blood Culture results may not be optimal due to an inadequate volume of blood received in culture bottles   Culture   Final    NO GROWTH < 24 HOURS Performed at [facility], [place] [address], [place], [place] [address]    Report Status PENDING  Incomplete     Radiology Studies: DG Chest Port 1 View  Result Date: 05/06/2021 CLINICAL DATA:  Pleural effusion EXAM: PORTABLE CHEST 1 VIEW COMPARISON:  05/05/2021 FINDINGS: Moderate left pleural effusion and associated left basilar consolidation are unchanged. Right basilar consolidation is stable. Interval development of a tiny right pleural effusion. No pneumothorax. Aortic valve replacement has been performed. Mild cardiomegaly is stable. Pulmonary vascularity is normal. Thoracic scoliosis is again noted. IMPRESSION: Stable left basilar consolidation and associated small to moderate left pleural effusion. Stable right basilar consolidation. Interval development of a tiny right pleural effusion. Electronically Signed   By: Fidela Salisbury MD   On: 05/06/2021 05:30    Scheduled Meds: . amiodarone  400 mg Oral BID   Followed by  . [START ON 05/11/2021] amiodarone  200 mg Oral Daily  .  Chlorhexidine Gluconate Cloth  6 each Topical Daily  . escitalopram  10 mg Oral Daily  . folic acid  1 mg Oral Daily  . furosemide  40 mg Oral Daily  . mouth rinse  15 mL Mouth Rinse BID  . metoprolol tartrate  50 mg Oral BID  . rosuvastatin  5 mg Oral Daily  . thiamine  100 mg Oral Daily  . traZODone  50 mg Oral QHS   Continuous Infusions: . sodium chloride 10 mL/hr at 05/06/21 1600  .  ceFAZolin (ANCEF) IV 2 g (05/07/21 1357)     LOS: 5 days   Marylu Lund, MD [facility] Pager On Amion  If 7PM-7AM, please contact night-coverage 05/07/2021, 6:05 PM

## 2021-05-08 DIAGNOSIS — Z9889 Other specified postprocedural states: Secondary | ICD-10-CM

## 2021-05-08 LAB — BASIC METABOLIC PANEL
Anion gap: 7 (ref 5–15)
BUN: 11 mg/dL (ref 8–23)
CO2: 29 mmol/L (ref 22–32)
Calcium: 8.5 mg/dL — ABNORMAL LOW (ref 8.9–10.3)
Chloride: 97 mmol/L — ABNORMAL LOW (ref 98–111)
Creatinine, Ser: 0.72 mg/dL (ref 0.44–1.00)
GFR, Estimated: >60 mL/min (ref >60)
Glucose, Bld: 119 mg/dL — ABNORMAL HIGH (ref 70–99)
Potassium: 3.7 mmol/L (ref 3.5–5.1)
Sodium: 133 mmol/L — ABNORMAL LOW (ref 135–145)

## 2021-05-08 LAB — CULTURE, BLOOD (ROUTINE X 2)
Culture: NO GROWTH
Special Requests: ADEQUATE

## 2021-05-08 LAB — BODY FLUID CULTURE W GRAM STAIN: Culture: NO GROWTH

## 2021-05-08 LAB — CHOLESTEROL, BODY FLUID: Cholesterol, Fluid: 25 mg/dL

## 2021-05-08 LAB — PHOSPHORUS: Phosphorus: 3.1 mg/dL (ref 2.5–4.6)

## 2021-05-08 LAB — MAGNESIUM: Magnesium: 2 mg/dL (ref 1.7–2.4)

## 2021-05-08 MED ORDER — FUROSEMIDE 40 MG PO TABS: 40.0000 mg | ORAL_TABLET | Freq: Every day | ORAL | 0 refills | Status: DC

## 2021-05-08 MED ORDER — METOPROLOL TARTRATE 50 MG PO TABS: 50.0000 mg | ORAL_TABLET | Freq: Two times a day (BID) | ORAL | 0 refills | Status: DC

## 2021-05-08 MED ORDER — AMIODARONE HCL 400 MG PO TABS
400.0000 mg | ORAL_TABLET | Freq: Two times a day (BID) | ORAL | 0 refills | Status: DC
Start: 2021-05-08 — End: 2021-05-20

## 2021-05-08 MED ORDER — AMIODARONE HCL 200 MG PO TABS: 200.0000 mg | ORAL_TABLET | Freq: Every day | ORAL | 0 refills | Status: DC

## 2021-05-08 NOTE — TOC Transition Note (Addendum)
Transition of Care [facility]) - CM/SW Discharge Note   Patient Details  Name: Deborah Koch MRN: [id number] Date of Birth: 1956/06/09  [facility] CM/SW Contact:  Carles Collet, RN Phone Number: 05/08/2021, 1:34 PM   Clinical Narrative:    Spoke to patient over the phone to discuss discharge plan. Discussed home health, and outpatient PT services. Patient was very excited to learn that [facility] offered services and would like to have therapy there. MD notified and referral placed in [place].  Patient has RW at home and will be sent home w 3/1.  Discharge plan reviewed with daughter    Final next level of care: Home/Self Care Barriers to Discharge: No Barriers Identified   Patient Goals and CMS Choice Patient states their goals for this hospitalization and ongoing recovery are:: return home to [place]      Discharge Placement                       Discharge Plan and Services                DME Arranged: [place] DME Agency: [employer] Date DME Agency Contacted: 05/08/21 Time DME Agency Contacted: [phone] Representative spoke with at DME Agency: [place]:  (patient wants referral to [facility] outpatient)          Social Determinants of Health (SDOH) Interventions     Readmission Risk Interventions No flowsheet data found.

## 2021-05-08 NOTE — Discharge Summary (Signed)
Physician Discharge Summary  Deborah Koch [id number] DOB: [birth year] DOA: 05/02/2021  PCP: Burnard Hawthorne, FNP  Admit date: 05/02/2021 Discharge date: 05/08/2021  Admitted From: Home Disposition:  Home  Recommendations for Outpatient Follow-up:  1. Follow up with PCP in 1-2 weeks 2. Follow up with CT surgery as scheduled 3. Follow up with Cardiology as scheduled  Home Health:PT   Discharge Condition:Stable CODE STATUS:Full Diet recommendation: Regular   Brief/Interim Summary: 65yo with hx bicuspid AV s/p SAVR and aneurysm repair on 5/5 who presented with sob initial concerns for sepsis. Admitted to ICU service initially  Discharge Diagnoses:  Active Problems:   Sepsis due to undetermined organism [facility])   Hypertensive emergency  Possible Staph epidermidis bacteriemiavs. More likely contaminant Reactive leukocytosis - Continued on IV cephazolin -Per PCCM, likely d/c abx if cultures remain neg. Cultures are neg. Discussed with CT Surgery who agreed with holding further abx  Acute respiratory failure with hypoxemia:resolved Rt pleural effusion: drained Lt pleural effusion: not clinically significant - Currently on room air  Pericardial effusion: (Possible hemorrhagic):  -Suspected to have hemopericardium likely 2/2 recent AVR surgery.   Possible perisplenic hematoma:Uncertain etiology. Hgb has remained stable  Afib/RVR- recurrent - Cardiology and TCTS managing: currently in sinus, stable - Cardiology has signed off - AC remains on hold for now -Cardiology has recommended amiodarone 400mg  bid x 6 more days, then 200mg  daily afterwards  Agitated delirium: -Alcohol and benzo use at home. - Continued Lexapro and Trazodon 50 mg QHS -Continued on home klonipin  Hyponatremia: -Improved  AKI:  -Resolved - continued on lasix  Deconditioning-  -Seen by PT -Initial plans for HHPT. After TOC discussed with patient, decision was for outpt PT  instead  Discharge Instructions   Allergies as of 05/08/2021      Reactions   Hydrocodone Hives, Rash   Iodine Rash   Has tolerated amiodarone 05/06/2021      Medication List    STOP taking these medications   aspirin 325 MG EC tablet   lisinopril 20 MG tablet Commonly known as: ZESTRIL   traMADol 50 MG tablet Commonly known as: ULTRAM     TAKE these medications   amiodarone 400 MG tablet Commonly known as: PACERONE Take 1 tablet (400 mg total) by mouth 2 (two) times daily for 6 days.   amiodarone 200 MG tablet Commonly known as: PACERONE Take 1 tablet (200 mg total) by mouth daily. Start taking on: May 15, 2021   clonazePAM 0.5 MG tablet Commonly known as: KLONOPIN TAKE 1 TABLET BY MOUTH TWICE A DAY AS NEEDED What changed: when to take this   escitalopram 10 MG tablet Commonly known as: LEXAPRO Take 1 tablet (10 mg total) by mouth daily. What changed: when to take this   furosemide 40 MG tablet Commonly known as: LASIX Take 1 tablet (40 mg total) by mouth daily. Start taking on: May 09, 2021   metoprolol tartrate 50 MG tablet Commonly known as: LOPRESSOR Take 1 tablet (50 mg total) by mouth 2 (two) times daily. What changed:   medication strength  how much to take   rosuvastatin 5 MG tablet Commonly known as: CRESTOR Take 1 tablet (5 mg total) by mouth daily. What changed: when to take this       Follow-up Information    Arnett, [name], FNP. Schedule an appointment as soon as possible for a visit in 2 week(s).   Specialty: Family Medicine Contact information: [address] Kristeen Mans [address]  [place]        Minna Merritts, MD .   Specialty: Cardiology Contact information: [address] [place] [address] [phone]        Gaye Pollack, MD Follow up.   Specialty: Cardiothoracic Surgery Why: as scheduled Contact information: [address] [place]  [address] [phone]              Allergies  Allergen Reactions  . Hydrocodone Hives and Rash  . Iodine Rash    Has tolerated amiodarone 05/06/2021    Consultations:  PCCM CT Surgery  Cardiology  Procedures/Studies: DG Chest 2 View  Result Date: 05/02/2021 CLINICAL DATA:  Sob chest pain post op 1 week ago heart surgery EXAM: CHEST - 2 VIEW COMPARISON:  04/24/2021 FINDINGS: Persistent small pleural effusions and bibasilar atelectasis/consolidation left greater than right. Heart size upper limits normal. Previous AVR. Aortic Atherosclerosis (ICD10-170.0). Ectatic thoracic aorta as previously documented. No pneumothorax. Moderate thoracolumbar dextroscoliosis without evident underlying vertebral anomaly. Surgical clips in the epigastrium. IMPRESSION: Persistent small pleural effusions and bibasilar consolidation/atelectasis. Electronically Signed   By: Lucrezia Europe M.D.   On: 05/02/2021 08:03   DG Chest 2 View  Result Date: 04/24/2021 CLINICAL DATA:  Atelectasis.  Postop cardiac surgery. EXAM: CHEST - 2 VIEW COMPARISON:  Chest x-ray 04/23/2021, CT chest 04/11/2021, chest x-ray 04/19/2021. FINDINGS: Interval removal of a right internal jugular central venous Cordis. Interval removal of 2 mediastinal drains. Retained pacing wires. Prominent ascending thoracic aorta. Atherosclerotic plaque. The heart size and mediastinal contours are unchanged. Aortic valve replacement. Elevation of the right hemidiaphragm with small volume pleural effusion. Likely trace to small volume left pleural effusion. Bibasilar atelectasis similar to prior. No pulmonary edema. No pleural effusion. No pneumothorax. No acute osseous abnormality. IMPRESSION: 1. Similar-appearing bilateral, right greater than left, pleural effusions with bibasilar atelectasis. 2. Other than line and tube removal, no change compared to chest x-ray 04/23/2021. Electronically Signed   By: Iven Finn M.D.   On: 04/24/2021 06:52   DG Chest 2  View  Result Date: 04/21/2021 CLINICAL DATA:  65 year old female under preoperative evaluation prior to aortic valve repair. EXAM: CHEST - 2 VIEW COMPARISON:  Chest x-ray 06/26/2019. FINDINGS: Lung volumes are normal. No consolidative airspace disease. No pleural effusions. No pneumothorax. No definite suspicious appearing pulmonary nodules or masses are noted. No evidence of pulmonary edema. Heart size is normal. Upper mediastinal contours are within normal limits. Atherosclerotic calcifications in the thoracic aorta. Severe dextroscoliosis of the mid to lower thoracic spine. IMPRESSION: 1. No radiographic evidence of acute cardiopulmonary disease. 2. Aortic atherosclerosis. Electronically Signed   By: Vinnie Langton M.D.   On: 04/21/2021 10:57   CT Head Wo Contrast  Result Date: 05/02/2021 CLINICAL DATA:  Mental status changes. EXAM: CT HEAD WITHOUT CONTRAST TECHNIQUE: Contiguous axial images were obtained from the base of the skull through the vertex without intravenous contrast. COMPARISON:  08/19/2017 FINDINGS: Brain: No acute intracranial abnormality. Specifically, no hemorrhage, hydrocephalus, mass lesion, acute infarction, or significant intracranial injury. Vascular: No hyperdense vessel or unexpected calcification. Skull: No acute calvarial abnormality. Sinuses/Orbits: No acute findings Other: None IMPRESSION: No acute intracranial abnormality. Electronically Signed   By: Rolm Baptise M.D.   On: 05/02/2021 09:42   CT Chest Wo Contrast  Result Date: 05/02/2021 CLINICAL DATA:  Chest pain. EXAM: CT CHEST WITHOUT CONTRAST TECHNIQUE: Multidetector CT imaging of the chest was performed following the standard protocol without IV contrast. COMPARISON:  April 11, 2021. FINDINGS: Cardiovascular: Status post aortic valve repair and surgical repair of ascending thoracic aorta. Moderate size pericardial effusion is noted with some high density material suggesting some degree of hemopericardium.  Atherosclerosis of thoracic aorta is noted. Coronary artery calcifications are noted. Mediastinum/Nodes: No enlarged mediastinal or axillary lymph nodes. Thyroid gland, trachea, and esophagus demonstrate no significant findings. Lungs/Pleura: No pneumothorax is noted. Small bilateral pleural effusions are noted with adjacent atelectasis of both lower lobes. Probable loculated pleural effusions are noted involving the left major fissure. Upper Abdomen: No acute abnormality. Musculoskeletal: Moderate to severe dextroscoliosis of the thoracic and lumbar spine is noted. No acute osseous abnormality is noted. IMPRESSION: Status post aortic valve repair and surgical pair of ascending thoracic aorta. Moderate size pericardial effusion is noted with some high density material present suggesting some degree of hemopericardium. CT a of the chest is recommended to rule out aortic extravasation or injury. Critical Value/emergent results were called by telephone at the time of interpretation on 05/02/2021 at 10:24 am to provider [facility] , who verbally acknowledged these results. Small bilateral pleural effusions are noted with adjacent atelectasis of both lower lobes. Probable loculated pleural effusions are seen involving the left major fissure. Aortic Atherosclerosis (ICD10-I70.0). Electronically Signed   By: Marijo Conception M.D.   On: 05/02/2021 10:24   CT CORONARY MORPH W/CTA COR W/SCORE W/CA W/CM &/OR WO/CM  Addendum Date: 04/11/2021   ADDENDUM REPORT: 04/11/2021 12:23 CLINICAL DATA:  Severe Aortic Stenosis. EXAM: Cardiac TAVR CT TECHNIQUE: The patient was scanned on a [employer]. A 90 kV retrospective scan was triggered in the descending thoracic aorta at 111 HU's. Gantry rotation speed was 250 msecs and collimation was .6 mm. No beta blockade or nitro were given. The 3D data set was reconstructed in 5% intervals of the R-R cycle. Systolic and diastolic phases were analyzed on a dedicated work station  using MPR, MIP and VRT modes. The patient received 100 cc of contrast. FINDINGS: Image quality: Excellent. Noise artifact is: Limited. Valve Morphology: The aortic valve is a 2-sinus bicuspid valve with latero-lateral configuration. There is no raphe. The leaflets are severely thickened with restricted movement in systole. There is bulky calcification noted in the anterior commissure. Aortic Valve Calcium score: 635 Aortic annular dimension: Phase assessed: 10% Annular area: 423 mm2 Annular perimeter: 73.2 mm Max diameter: 24.1 mm Min diameter: 22.7 mm Annular and subannular calcification: Minimal annular calcification under the anterior commissure. Optimal coplanar projection: LAO 4 CRA 10 Coronary Artery Height above Annulus: Left Main: 16.9 mm Right Coronary: 12.0 mm Sinus of Valsalva Measurements: Inter-commissure: 28 mm Maximal diameter: 34 mm Sinus of Valsalva Height: Right lateral: 16.6 mm Left lateral: 22.9 mm Sinotubular Junction: 36 mm Ascending Thoracic Aorta: Aneurysmal up to 47 mm (double-oblique). Coronary Arteries: Normal coronary origin. Right dominance. The study was performed without use of NTG and is insufficient for plaque evaluation. Please refer to recent cardiac catheterization for coronary assessment. 3-vessel coronary calcifications noted. Cardiac Morphology: Right Atrium: Right atrial size is within normal limits. Right Ventricle: The right ventricular cavity is within normal limits. Left Atrium: Left atrial size is normal in size with no left atrial appendage filling defect. Left Ventricle: The ventricular cavity size is within normal limits. There are no stigmata of prior infarction. There is no abnormal filling defect. Normal left ventricular function, LVEF 72%. No regional wall motion abnormalities. Pulmonary arteries: Normal in size without proximal filling defect. Pulmonary veins: Normal pulmonary venous drainage. Pericardium:  Normal thickness with no significant effusion or calcium  present. Mitral Valve: The mitral valve is normal structure without significant calcification. Extra-cardiac findings: See attached radiology report for non-cardiac structures. IMPRESSION: 1. The aortic valve is a 2-sinus bicuspid valve with latero-lateral configuration without raphe. Bulky calcification noted in the anterior commissure. 2. Annular measurements appropriate for 23 mm Sapien 3 TAVR (423 mm2). 3. Minimal annular calcification noted. 4. Sufficient coronary to annulus distance. 5. Optimal Fluoroscopic Angle for Delivery: LAO 4 CRA 10 6. Aneurysm of the ascending aorta up to 47 mm (double-oblique). Tortuous descending aorta noted. 7. 3-vessel coronary calcifications. [place] T. Audie Box, MD Electronically Signed   By: Eleonore Chiquito   On: 04/11/2021 12:23   Result Date: 04/11/2021 EXAM: OVER-READ INTERPRETATION  CT CHEST The following report is an over-read performed by radiologist Dr. Vinnie Langton of [facility], [place] on 04/11/2021. This over-read does not include interpretation of cardiac or coronary anatomy or pathology. The coronary calcium score/coronary CTA interpretation by the cardiologist is attached. COMPARISON:  None. FINDINGS: Extracardiac findings will be described separately under dictation for contemporaneously obtained CTA chest, abdomen or pelvis IMPRESSION: Please see separate dictation for contemporaneously obtained CTA chest, abdomen and pelvis dated 04/11/2021 for full description of relevant extracardiac findings. Electronically Signed: By: Vinnie Langton M.D. On: 04/11/2021 11:18   DG Chest Port 1 View  Result Date: 05/06/2021 CLINICAL DATA:  Pleural effusion EXAM: PORTABLE CHEST 1 VIEW COMPARISON:  05/05/2021 FINDINGS: Moderate left pleural effusion and associated left basilar consolidation are unchanged. Right basilar consolidation is stable. Interval development of a tiny right pleural effusion. No pneumothorax. Aortic valve replacement has been performed. Mild  cardiomegaly is stable. Pulmonary vascularity is normal. Thoracic scoliosis is again noted. IMPRESSION: Stable left basilar consolidation and associated small to moderate left pleural effusion. Stable right basilar consolidation. Interval development of a tiny right pleural effusion. Electronically Signed   By: Fidela Salisbury MD   On: 05/06/2021 05:30   DG Chest Port 1 View  Result Date: 05/05/2021 CLINICAL DATA:  Status post thoracentesis EXAM: PORTABLE CHEST 1 VIEW COMPARISON:  May 05, 2021 study obtained earlier in the day FINDINGS: Right pleural effusion smaller after thoracentesis. No pneumothorax. There is a left pleural effusion with consolidation in the left lower lobe, stable. There is cardiomegaly with pulmonary vascularity normal. Patient is status post aortic valve replacement. There is aortic atherosclerosis. No adenopathy. There is thoracic dextroscoliosis. IMPRESSION: No pneumothorax. Right pleural effusion smaller after thoracentesis. Left pleural effusion with left lower lobe consolidation persists. Stable cardiomegaly with postoperative changes. Aortic Atherosclerosis (ICD10-I70.0). Electronically Signed   By: Lowella Grip III M.D.   On: 05/05/2021 11:36   DG Chest Port 1 View  Result Date: 05/05/2021 CLINICAL DATA:  Pleural effusion. EXAM: PORTABLE CHEST 1 VIEW COMPARISON:  05/04/2021. FINDINGS: Prior cardiac valve replacement. Severe cardiomegaly again noted. Diffuse bilateral pulmonary infiltrates/edema. Bilateral pleural effusions. Findings suggest CHF. Bilateral pneumonia cannot be excluded. No pneumothorax. Severe thoracic spine scoliosis again noted. IMPRESSION: 1. Prior cardiac valve replacement. Severe cardiomegaly again noted. 2. Diffuse bilateral pulmonary infiltrates/edema. Bilateral pleural effusions noted. Findings suggest CHF. Electronically Signed   By: Marcello Moores  Register   On: 05/05/2021 05:23   DG Chest Port 1 View  Result Date: 05/04/2021 CLINICAL DATA:  Pleural  effusion EXAM: PORTABLE CHEST 1 VIEW COMPARISON:  05/03/2021, CT 05/02/2021 FINDINGS: Pulmonary insufflation is preserved. Moderate right and small left pleural effusions are again identified with associated bibasilar atelectasis or infiltrate. Mild to moderate cardiomegaly  is again noted. Aortic valve replacement has been performed. Pulmonary vascularity is normal. Moderate thoracic dextroscoliosis again noted. IMPRESSION: Stable examination with bilateral pleural effusions, right greater than left, and bibasilar atelectasis or infiltrate. Stable mild-to-moderate cardiomegaly. Electronically Signed   By: Fidela Salisbury MD   On: 05/04/2021 07:07   DG CHEST PORT 1 VIEW  Result Date: 05/03/2021 CLINICAL DATA:  Pleural effusion, shortness of breath, chest pain EXAM: PORTABLE CHEST 1 VIEW COMPARISON:  05/02/2021 FINDINGS: Cardiomegaly. Prior median sternotomy and valve replacement. Layering bilateral effusions with bibasilar atelectasis or infiltrates. Findings are similar to prior study. IMPRESSION: No significant change. Electronically Signed   By: Rolm Baptise M.D.   On: 05/03/2021 10:06   DG Chest Port 1 View  Result Date: 04/23/2021 CLINICAL DATA:  Follow-up from aortic valve replacement. EXAM: PORTABLE CHEST 1 VIEW COMPARISON:  04/22/2021 and older studies. FINDINGS: Swan-Ganz catheter removed since the previous day's study. Right internal jugular introducer sheath remains in place. Mediastinal tubes are stable. Vascular prominence and interstitial thickening has improved from the previous day's exam. No current evidence of pulmonary edema. Persistent basilar opacities consistent with pleural effusions with atelectasis. No pneumothorax. No mediastinal widening. IMPRESSION: 1. Improved lung aeration from the previous day's study. No current evidence of pulmonary edema. 2. Bilateral pleural effusions with lung base atelectasis similar to the previous day's exam. 3. No mediastinal widening. 4. Remaining  support apparatus is stable and well positioned. Electronically Signed   By: Lajean Manes M.D.   On: 04/23/2021 08:59   DG Chest Port 1 View  Result Date: 04/22/2021 CLINICAL DATA:  Chest soreness.  Aortic aneurysm repair. EXAM: PORTABLE CHEST 1 VIEW COMPARISON:  04/21/2021 FINDINGS: Interval removal of endotracheal and enteric tubes. Right IJ approach Swan-Ganz catheter remains in stable positioning. Mediastinal drains remain in place. Median sternotomy and mediastinal clips. Aortic valve replacement. Stable cardiomegaly. Aortic atherosclerosis. Moderate bilateral pleural effusions with hazy opacities throughout the mid to lower lung fields. No pneumothorax. IMPRESSION: 1. Moderate bilateral pleural effusions with edema and bibasilar atelectasis. No pneumothorax. 2. Interval removal of endotracheal and enteric tubes. Remaining support apparatus remain in place. Electronically Signed   By: Davina Poke D.O.   On: 04/22/2021 08:42   DG Chest Port 1 View  Result Date: 04/21/2021 CLINICAL DATA:  Ascending aortic aneurysm repair. EXAM: PORTABLE CHEST 1 VIEW COMPARISON:  04/19/2021. FINDINGS: Endotracheal tube noted with its tip 2 cm above the carina. NG tube noted with tip below left hemidiaphragm. Swan-Ganz catheter with tip in pulmonary outflow tract. Mediastinal drainage catheters in good anatomic position. Clip noted over the upper descending thoracic aorta. This could represent a balloon pump, clinical correlation suggested. Prior cardiac valve replacement. Thoracic aorta is tortuous. Cardiomegaly. Bilateral pulmonary infiltrates/edema and small bilateral pleural effusions. Findings suggest CHF. Pneumonia cannot be excluded. No pneumothorax. Mild subcutaneous emphysema right chest. IMPRESSION: 1.  Lines and tubes in good anatomic position.  No pneumothorax. 2. Prior cardiac valve replacement. Clip noted over the upper descending thoracic aorta. This could represent a balloon pump, clinical correlation  suggested. Cardiomegaly. Thoracic aorta is tortuous. Bilateral pulmonary infiltrates/edema small bilateral pleural effusions. Findings suggest CHF. Electronically Signed   By: Marcello Moores  Register   On: 04/21/2021 16:17   CT ANGIO CHEST AORTA W/CM & OR WO/CM  Result Date: 04/12/2021 CLINICAL DATA:  65 year old female with history of severe aortic stenosis. Preprocedural study prior to potential transcatheter aortic valve replacement (TAVR) procedure. EXAM: CT CHEST, ABDOMEN AND PELVIS WITHOUT CONTRAST TECHNIQUE: Multidetector CT  imaging of the chest, abdomen and pelvis was performed following the standard protocol without IV contrast. COMPARISON:  CT the abdomen and pelvis 12/26/2016. No prior chest CT. FINDINGS: CTA CHEST FINDINGS Cardiovascular: Heart size is normal. There is no significant pericardial fluid, thickening or pericardial calcification. There is aortic atherosclerosis, as well as atherosclerosis of the great vessels of the mediastinum and the coronary arteries, including calcified atherosclerotic plaque in the left anterior descending and right coronary arteries. Severe thickening calcification of the aortic valve. Aneurysmal dilatation of the ascending thoracic aorta which measures up to 4.7 cm in diameter. Mediastinum/Lymph Nodes: No pathologically enlarged mediastinal or hilar lymph nodes. Esophagus is unremarkable in appearance. No axillary lymphadenopathy. Lungs/Pleura: No acute consolidative airspace disease. No pleural effusions. 4 mm right upper lobe pulmonary nodule (axial image 43 of series 5). No other larger more suspicious appearing pulmonary nodules or masses are noted. Musculoskeletal/Soft Tissues: Severe dextroscoliosis of the thoracolumbar spine. There are no aggressive appearing lytic or blastic lesions noted in the visualized portions of the skeleton. CTA ABDOMEN AND PELVIS FINDINGS Hepatobiliary: Diffuse low attenuation throughout the hepatic parenchyma, indicative of a background  of hepatic steatosis. No suspicious cystic or solid hepatic lesions. No intra or extrahepatic biliary ductal dilatation. Gallbladder is normal in appearance. Pancreas: No pancreatic mass. No pancreatic ductal dilatation. No pancreatic or peripancreatic fluid collections or inflammatory changes. Spleen: Unremarkable. Adrenals/Urinary Tract: Bilateral kidneys and adrenal glands are normal in appearance. No hydroureteronephrosis. Urinary bladder is normal in appearance. Stomach/Bowel: Normal appearance of the stomach. No pathologic dilatation of small bowel or colon. Numerous colonic diverticulae are noted, particularly in the sigmoid colon, without surrounding inflammatory changes to suggest an acute diverticulitis at this time. Normal appendix. Vascular/Lymphatic: Aortic atherosclerosis, without evidence of aneurysm or dissection in the abdominal or pelvic vasculature. Vascular findings and measurements pertinent to potential TAVR procedure, as detailed below. High attenuation soft tissue stranding adjacent to the right common femoral artery and proximal right superficial femoral artery, likely to reflect a small postprocedural hematoma. No lymphadenopathy noted in the abdomen or pelvis. Reproductive: Uterus and ovaries are atrophic. Other: No significant volume of ascites.  No pneumoperitoneum. Musculoskeletal: There are no aggressive appearing lytic or blastic lesions noted in the visualized portions of the skeleton. VASCULAR MEASUREMENTS PERTINENT TO TAVR: AORTA: Minimal Aortic Diameter-14 x 12 mm Severity of Aortic Calcification-mild to moderate RIGHT PELVIS: Right Common Iliac Artery - Minimal Diameter-7.1 x 5.9 mm Tortuosity-mild Calcification-mild Right External Iliac Artery - Minimal Diameter-5.8 x 5.4 mm Tortuosity-mild Calcification-none Right Common Femoral Artery - Minimal Diameter-6.6 x 4.7 mm Tortuosity-mild Calcification-mild LEFT PELVIS: Left Common Iliac Artery - Minimal Diameter-7.0 x 8.3 mm  Tortuosity-mild Calcification-mild Left External Iliac Artery - Minimal Diameter-5.8 x 5.8 mm Tortuosity-moderate Calcification-none Left Common Femoral Artery - Minimal Diameter-6.0 x 6.0 mm Tortuosity-mild Calcification-none Review of the MIP images confirms the above findings. IMPRESSION: 1. Vascular findings and measurements pertinent to potential TAVR procedure, as detailed above. 2. Severe thickening calcification of the aortic valve, the appearance of which suggests probable bicuspid valve, compatible with reported clinical history of severe aortic stenosis. 3. Aortic atherosclerosis with 2 vessel coronary artery disease and aneurysmal dilatation of the ascending thoracic aorta (4.7 cm in diameter). Ascending thoracic aortic aneurysm. Recommend semi-annual imaging followup by CTA or MRA and referral to cardiothoracic surgery if not already obtained. This recommendation follows 2010 ACCF/AHA/AATS/ACR/ASA/SCA/SCAI/SIR/STS/SVM Guidelines for the Diagnosis and Management of Patients With Thoracic Aortic Disease. Circulation. 2010; [id number]. Aortic aneurysm NOS (ICD10-I71.9). 4. 4 mm  right upper lobe pulmonary nodule, nonspecific, but statistically likely benign. No follow-up needed if patient is low-risk. Non-contrast chest CT can be considered in 12 months if patient is high-risk. This recommendation follows the consensus statement: Guidelines for Management of Incidental Pulmonary Nodules Detected on CT Images: From the Fleischner Society 2017; Radiology 2017; 284:228-243. 5. Severe colonic diverticulosis without evidence of acute diverticulitis at this time. 6. Hepatic steatosis. 7. Additional incidental findings, as above. Electronically Signed   By: Vinnie Langton M.D.   On: 04/12/2021 06:02   ECHOCARDIOGRAM COMPLETE  Result Date: 05/02/2021    ECHOCARDIOGRAM REPORT   Patient Name:   Deborah Koch Date of Exam: 05/02/2021 Medical Rec #:  [id number]            Height:       62.0 in Accession  #:    [id number]           Weight:       131.0 lb Date of Birth:  09/29/1956            BSA:          1.597 m Patient Age:    39 years             BP:           119/82 mmHg Patient Gender: F                    HR:           68 bpm. Exam Location:  Inpatient Procedure: 2D Echo, Cardiac Doppler and Color Doppler Indications:    Aortic valve disorder I35.9                 Pericardial effusion I31.3  History:        Patient has prior history of Echocardiogram examinations, most                 recent 04/21/2021. Aortic Valve Disease.  Sonographer:    Merrie Roof RDCS Referring Phys: [id number] [place]  1. Left ventricular ejection fraction, by estimation, is 65 to 70%. The left ventricle has normal function. The left ventricle has no regional wall motion abnormalities. Left ventricular diastolic parameters are consistent with Grade I diastolic dysfunction (impaired relaxation).  2. Right ventricular systolic function is normal. The right ventricular size is normal.  3. Left atrial size was severely dilated.  4. Right atrial size was mildly dilated.  5. Moderate pericardial effusion surrounds heart with some consolidation (? clot) Except for dilated IVC, effusion does not appear to be hemodynamically compromising by echo criteria.. Moderate pericardial effusion.  6. Mild mitral valve regurgitation.  7. TR is eccentric, directed toward interatrial septum.. Tricuspid valve regurgitation is moderate.  8. S/p AVR with 23 mm Edwards pericaridal tissue valve (04/21/21) Valve appears well seated. Peak and mean gradients through the valve are 29 adn 12 mm Hg respectively Dimensionless index is 0.54.[name] The aortic valve has been repaired/replaced. Aortic valve  regurgitation is not visualized.  9. The inferior vena cava is dilated in size with <50% respiratory variability, suggesting right atrial pressure of 15 mmHg. FINDINGS  Left Ventricle: Left ventricular ejection fraction, by estimation, is 65 to 70%. The left  ventricle has normal function. The left ventricle has no regional wall motion abnormalities. The left ventricular internal cavity size was normal in size. There is  no left ventricular hypertrophy. Left ventricular diastolic parameters are consistent with Grade I diastolic dysfunction (impaired  relaxation). Right Ventricle: The right ventricular size is normal. Right vetricular wall thickness was not assessed. Right ventricular systolic function is normal. Left Atrium: Left atrial size was severely dilated. Right Atrium: Right atrial size was mildly dilated. Pericardium: Moderate pericardial effusion surrounds heart with some consolidation (? clot) Except for dilated IVC, effusion does not appear to be hemodynamically compromising by echo criteria. A moderately sized pericardial effusion is present. Mitral Valve: The mitral valve is normal in structure. Mild mitral valve regurgitation. Tricuspid Valve: TR is eccentric, directed toward interatrial septum. The tricuspid valve is normal in structure. Tricuspid valve regurgitation is moderate. Aortic Valve: S/p AVR with 23 mm Edwards pericaridal tissue valve (04/21/21) Valve appears well seated. Peak and mean gradients through the valve are 29 adn 12 mm Hg respectively Dimensionless index is 0.54. The aortic valve has been repaired/replaced. Aortic valve regurgitation is not visualized. Aortic valve mean gradient measures 12.0 mmHg. Aortic valve peak gradient measures 29.2 mmHg. Aortic valve area, by VTI measures 1.39 cm. Pulmonic Valve: The pulmonic valve was normal in structure. Pulmonic valve regurgitation is not visualized. Aorta: S/p Hemishield graft. The aortic root is normal in size and structure. Venous: The inferior vena cava is dilated in size with less than 50% respiratory variability, suggesting right atrial pressure of 15 mmHg. IAS/Shunts: The interatrial septum was not assessed.  LEFT VENTRICLE PLAX 2D LVIDd:         4.99 cm  Diastology LVIDs:         3.52  cm  LV e' medial:    7.29 cm/s LV PW:         1.11 cm  LV E/e' medial:  11.4 LV IVS:        1.00 cm  LV e' lateral:   8.59 cm/s LVOT diam:     1.80 cm  LV E/e' lateral: 9.7 LV SV:         65 LV SV Index:   41 LVOT Area:     2.54 cm  RIGHT VENTRICLE          IVC RV Basal diam:  3.80 cm  IVC diam: 2.55 cm LEFT ATRIUM              Index LA diam:        3.90 cm  2.44 cm/m LA Vol (A2C):   102.0 ml 63.87 ml/m LA Vol (A4C):   89.0 ml  55.73 ml/m LA Biplane Vol: 105.0 ml 65.75 ml/m  AORTIC VALVE AV Area (Vmax):    1.16 cm AV Area (Vmean):   1.18 cm AV Area (VTI):     1.39 cm AV Vmax:           270.00 cm/s AV Vmean:          156.000 cm/s AV VTI:            0.470 m AV Peak Grad:      29.2 mmHg AV Mean Grad:      12.0 mmHg LVOT Vmax:         123.00 cm/s LVOT Vmean:        72.300 cm/s LVOT VTI:          0.256 m LVOT/AV VTI ratio: 0.54  AORTA Ao Root diam: 2.70 cm MITRAL VALVE                TRICUSPID VALVE MV Area (PHT): 2.60 cm     TR Peak grad:   21.5 mmHg MV Decel Time: 292 msec  TR Vmax:        232.00 cm/s MV E velocity: 82.90 cm/s MV A velocity: 101.00 cm/s  SHUNTS MV E/A ratio:  0.82         Systemic VTI:  0.26 m                             Systemic Diam: 1.80 cm Dorris Carnes MD Electronically signed by Dorris Carnes MD Signature Date/Time: 05/02/2021/6:36:24 PM    Final    ECHO INTRAOPERATIVE TEE  Result Date: 04/21/2021  *INTRAOPERATIVE TRANSESOPHAGEAL REPORT *  Patient Name:   Deborah Koch Date of Exam: 04/21/2021 Medical Rec #:  [id number]            Height:       62.0 in Accession #:    [id number]           Weight:       134.1 lb Date of Birth:  December 13, 1956            BSA:          1.61 m Patient Age:    34 years             BP:           135/75 mmHg Patient Gender: F                    HR:           70 bpm. Exam Location:  Anesthesiology Transesophogeal exam was perform intraoperatively during surgical procedure. Patient was closely monitored under general anesthesia during the entirety of examination.  Indications:     I71.2 Ascending aortic aneurysm; Q23.1 Bicuspid aortic valve Performing Phys: Renold Don MD Diagnosing Phys: Renold Don MD Complications: No known complications during this procedure. POST-OP IMPRESSIONS - Left Ventricle: The left ventricle is unchanged from pre-bypass. - Right Ventricle: The right ventricle appears unchanged from pre-bypass. - Aorta: A graft was placed in the ascending aorta for repair. - Left Atrium: The left atrium appears unchanged from pre-bypass. - Left Atrial Appendage: The left atrial appendage appears unchanged from pre-bypass. - Aortic Valve: A bovine bioprosthetic valve was placed, leaflets are freely mobile and leaflets thin Size; 50mm. - Mitral Valve: The mitral valve appears unchanged from pre-bypass. - Tricuspid Valve: The tricuspid valve appears unchanged from pre-bypass. - Pulmonic Valve: The pulmonic valve appears unchanged from pre-bypass. - Interatrial Septum: The interatrial septum appears unchanged from pre-bypass. - Interventricular Septum: The interventricular septum appears unchanged from pre-bypass. - Pericardium: The pericardium appears unchanged from pre-bypass. PRE-OP FINDINGS  Left Ventricle: The left ventricle has normal systolic function, with an ejection fraction of 60-65%. The cavity size was normal. There is moderately increased left ventricular wall thickness. There is moderate concentric left ventricular hypertrophy. Right Ventricle: The right ventricle has normal systolic function. The cavity was normal. There is no increase in right ventricular wall thickness. Left Atrium: Left atrial size was normal in size. No left atrial/left atrial appendage thrombus was detected. Right Atrium: Right atrial size was normal in size. Interatrial Septum: No atrial level shunt detected by color flow Doppler. Pericardium: There is no evidence of pericardial effusion. Mitral Valve: The mitral valve is normal in structure. Mitral valve regurgitation is trivial  by color flow Doppler. There is No evidence of mitral stenosis. Tricuspid Valve: The tricuspid valve was normal in structure. Tricuspid valve regurgitation is mild by color flow Doppler. Aortic Valve:  The aortic valve is bicuspid Aortic valve regurgitation is trivial by color flow Doppler. There is severe stenosis of the aortic valve. AV peak gradient 60 mmHg. Pulmonic Valve: The pulmonic valve was normal in structure. Pulmonic valve regurgitation is not visualized by color flow Doppler. Aorta: The is normal in size and structure. The aortic arch was not well visualized. There is dilatation of the ascending aorta, measuring 40 mm. +-------------+---------++ AORTIC VALVE           +-------------+---------++ AV Mean Grad:35.0 mmHg +-------------+---------++  Renold Don MD Electronically signed by Renold Don MD Signature Date/Time: 04/21/2021/2:46:13 PM    Final    CT Angio Chest/Abd/Pel for Dissection W and/or W/WO  Result Date: 05/02/2021 CLINICAL DATA:  Chest pain after aortic valve repair. EXAM: CT ANGIOGRAPHY CHEST, ABDOMEN AND PELVIS TECHNIQUE: Non-contrast CT of the chest was initially obtained. Multidetector CT imaging through the chest, abdomen and pelvis was performed using the standard protocol during bolus administration of intravenous contrast. Multiplanar reconstructed images and MIPs were obtained and reviewed to evaluate the vascular anatomy. CONTRAST:  66mL OMNIPAQUE IOHEXOL 350 MG/ML SOLN COMPARISON:  Same day.  April 11, 2021. FINDINGS: CTA CHEST FINDINGS Cardiovascular: Status post aortic valve repair and surgical repair of ascending thoracic aortic aneurysm. There is noted moderate pericardial fluid with high density within it at an average Hounsfield measurement of 43 along the left cardiac border, concerning for hemopericardium. Coronary artery calcifications are noted. There is no definite dissection is seen involving the thoracic aorta. Atherosclerosis is noted involving the  transverse aortic arch and descending thoracic aorta. There is noted an outpouching of contrast measuring 10 x 9 mm arising from the transverse aortic arch inferiorly into the left with adjacent calcification or other high-density material, best seen on image number 77 of series 9. This may represent a surgical access point or graft outpouching, but small contained pseudoaneurysm cannot be excluded. No definite active extravasation into the pericardium is noted. Mediastinum/Nodes: No enlarged mediastinal, hilar, or axillary lymph nodes. Thyroid gland, trachea, and esophagus demonstrate no significant findings. Lungs/Pleura: No pneumothorax is noted. Small to moderate size right pleural effusion is noted with associated atelectasis of the right upper and lower lobes. Small loculated effusions are seen involving the superior and lower aspects of the left major fissure. Mild left basilar subsegmental atelectasis is noted. Musculoskeletal: No chest wall abnormality. No acute or significant osseous findings. Review of the MIP images confirms the above findings. CTA ABDOMEN AND PELVIS FINDINGS VASCULAR Aorta: Atherosclerosis of thoracic aorta is noted without aneurysm or dissection. Celiac: Patent without evidence of aneurysm, dissection, vasculitis or significant stenosis. SMA: Patent without evidence of aneurysm, dissection, vasculitis or significant stenosis. Renals: Both renal arteries are patent without evidence of aneurysm, dissection, vasculitis, fibromuscular dysplasia or significant stenosis. IMA: Patent without evidence of aneurysm, dissection, vasculitis or significant stenosis. Inflow: Patent without evidence of aneurysm, dissection, vasculitis or significant stenosis. Veins: No obvious venous abnormality within the limitations of this arterial phase study. Review of the MIP images confirms the above findings. NON-VASCULAR Hepatobiliary: No focal liver abnormality is seen. No gallstones, gallbladder wall  thickening, or biliary dilatation. Pancreas: Unremarkable. No pancreatic ductal dilatation or surrounding inflammatory changes. Spleen: There is interval development of a moderate amount of high density material around the spleen with an average Hounsfield measurement of 56, concerning for possible perisplenic hematoma. Adrenals/Urinary Tract: Adrenal glands are unremarkable. Kidneys are normal, without renal calculi, focal lesion, or hydronephrosis. Bladder is unremarkable. Stomach/Bowel: Stomach is within normal  limits. Appendix appears normal. No evidence of bowel wall thickening, distention, or inflammatory changes. Sigmoid diverticulosis is noted without inflammation. Lymphatic: No significant adenopathy is noted. Reproductive: Uterus and bilateral adnexa are unremarkable. Other: No hernia is noted. Musculoskeletal: No acute or significant osseous findings. Review of the MIP images confirms the above findings. IMPRESSION: Status post recent aortic valve replacement and surgical repair of ascending thoracic aortic aneurysm. Moderate size pericardial effusion is noted which contains high density material and is concerning for some degree of hemopericardium as noted on previous exam of same day. There does not appear to be any evidence of thoracic aortic dissection. 10 x 9 mm outpouching of contrast is seen arising from the transverse aortic arch inferiorly and to the left which may represent a surgical access point or portion of the aortic graft, but focal pseudoaneurysm cannot be excluded. No definite active extravasation into other portions of the pericardium is noted. Correlation with the intraoperative details of the patient's recent surgery is recommended. Also noted is moderate sized complex material around the spleen concerning for possible perisplenic hematoma of unknown etiology. Small to moderate size right pleural effusion is noted with adjacent atelectasis of the right upper and lower lobes. Small  loculated effusions are seen involving the left major fissure with mild atelectasis in the left lower lobe. Sigmoid diverticulosis without inflammation. Aortic Atherosclerosis (ICD10-I70.0). Electronically Signed   By: Marijo Conception M.D.   On: 05/02/2021 13:02   VAS US DOPPLER PRE CABG  Result Date: 04/19/2021 PREOPERATIVE VASCULAR EVALUATION Patient Name:  CATHLINE SITZE  Date of Exam:   04/19/2021 Medical Rec #: [id number]             Accession #:    [id number] Date of Birth: 12/07/1956             Patient Gender: F Patient Age:   14Y Exam Location:  [facility] Procedure:      VAS US DOPPLER PRE CABG Referring Phys: [id number] BRYAN K BARTLE --------------------------------------------------------------------------------  Indications:      Pre-AVR, severe aortic valve stenosis. Risk Factors:     Hypertension, hyperlipidemia. Comparison Study: No prior studies. Performing Technologist: Darlin Coco RDMS RVT  Examination Guidelines: A complete evaluation includes B-mode imaging, spectral Doppler, color Doppler, and power Doppler as needed of all accessible portions of each vessel. Bilateral testing is considered an integral part of a complete examination. Limited examinations for reoccurring indications may be performed as noted.  Right Carotid Findings: +----------+--------+--------+--------+--------+------------------+           PSV cm/sEDV cm/sStenosisDescribeComments           +----------+--------+--------+--------+--------+------------------+ CCA Prox  70      17                      intimal thickening +----------+--------+--------+--------+--------+------------------+ CCA Distal60      18                      intimal thickening +----------+--------+--------+--------+--------+------------------+ ICA Prox  70      17                                         +----------+--------+--------+--------+--------+------------------+ ICA Distal79      31                                          +----------+--------+--------+--------+--------+------------------+  ECA       70      14                                         +----------+--------+--------+--------+--------+------------------+ Portions of this table do not appear on this page. +----------+--------+-------+----------------+------------+           PSV cm/sEDV cmsDescribe        Arm Pressure +----------+--------+-------+----------------+------------+ Subclavian94             Multiphasic, WNL             +----------+--------+-------+----------------+------------+ +---------+--------+--+--------+--+---------+ VertebralPSV cm/s42EDV cm/s12Antegrade +---------+--------+--+--------+--+---------+ Left Carotid Findings: +----------+--------+--------+--------+--------+------------------+           PSV cm/sEDV cm/sStenosisDescribeComments           +----------+--------+--------+--------+--------+------------------+ CCA Prox  88      17                      intimal thickening +----------+--------+--------+--------+--------+------------------+ CCA Distal50      16                      intimal thickening +----------+--------+--------+--------+--------+------------------+ ICA Prox  54      17                                         +----------+--------+--------+--------+--------+------------------+ ICA Distal86      21                                         +----------+--------+--------+--------+--------+------------------+ ECA       52      11                                         +----------+--------+--------+--------+--------+------------------+ +----------+--------+--------+----------------+------------+ SubclavianPSV cm/sEDV cm/sDescribe        Arm Pressure +----------+--------+--------+----------------+------------+           86              Multiphasic, WNL             +----------+--------+--------+----------------+------------+  +---------+--------+--+--------+--+---------+ VertebralPSV cm/s42EDV cm/s12Antegrade +---------+--------+--+--------+--+---------+  ABI Findings: +--------+------------------+-----+---------+--------+ Right   Rt Pressure (mmHg)IndexWaveform Comment  +--------+------------------+-----+---------+--------+ [id number]                    triphasic         +--------+------------------+-----+---------+--------+ +--------+------------------+-----+---------+-------+ Left    Lt Pressure (mmHg)IndexWaveform Comment +--------+------------------+-----+---------+-------+ [id number]                    triphasic        +--------+------------------+-----+---------+-------+  Right Doppler Findings: +--------+--------+-----+---------+--------+ Site    PressureIndexDoppler  Comments +--------+--------+-----+---------+--------+ [id number]          triphasic         +--------+--------+-----+---------+--------+ Radial               biphasic          +--------+--------+-----+---------+--------+ Ulnar                biphasic          +--------+--------+-----+---------+--------+  Left  Doppler Findings: +--------+--------+-----+---------+--------+ Site    PressureIndexDoppler  Comments +--------+--------+-----+---------+--------+ [id number]          triphasic         +--------+--------+-----+---------+--------+ Radial               biphasic          +--------+--------+-----+---------+--------+ Ulnar                biphasic          +--------+--------+-----+---------+--------+  Summary: Right Carotid: The extracranial vessels were near-normal with only minimal wall                thickening or plaque. Left Carotid: The extracranial vessels were near-normal with only minimal wall               thickening or plaque. Vertebrals:  Bilateral vertebral arteries demonstrate antegrade flow. Subclavians: Normal flow hemodynamics were seen in bilateral subclavian               arteries. Right Upper Extremity: Doppler waveforms remain within normal limits with right radial compression. Doppler waveforms remain within normal limits with right ulnar compression. Left Upper Extremity: Doppler waveforms remain within normal limits with left radial compression. Doppler waveforms remain within normal limits with left ulnar compression.  Electronically signed by Curt Jews MD on 04/19/2021 at 8:19:00 PM.    Final    ECHOCARDIOGRAM LIMITED  Result Date: 05/04/2021    ECHOCARDIOGRAM LIMITED REPORT   Patient Name:   Deborah Koch Date of Exam: 05/04/2021 Medical Rec #:  [id number]            Height:       62.0 in Accession #:    [id number]           Weight:       135.8 lb Date of Birth:  1956-05-08            BSA:          1.622 m Patient Age:    78 years             BP:           128/98 mmHg Patient Gender: F                    HR:           118 bpm. Exam Location:  Inpatient Procedure: Limited Echo, Limited Color Doppler and Cardiac Doppler STAT ECHO Indications:    Atrial Fibrillation I48.91  History:        Patient has prior history of Echocardiogram examinations, most                 recent 05/02/2021. Aortic Valve Disease, Arrythmias:Atrial                 Fibrillation, Signs/Symptoms:Murmur; Risk Factors:Hypertension.                 Bilateral pleural effusions, hemorrhagic pericardial effusion.                 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                 Aortic Valve: 23 mm Inspiris pericardial valve is present in the                 aortic position. Procedure Date: 04/21/2021.  Sonographer:    Darlina Sicilian RDCS Referring Phys: [id number] [place]  1. Limited study to  evaluate moderate pericardial effusion. The pericardial effusion is circumferential. There is no evidence of increase pericardial pressure, the there is IVC dilation. In RV focus view, RV has delayed expansion but not evidence of collapse. There is echodensity within the effusion best seen in the  [facility] view.  2. The inferior vena cava is dilated in size with <50% respiratory variability, suggesting right atrial pressure of 15 mmHg.  3. Left ventricular ejection fraction, by estimation, is 70 to 75%. The left ventricle has hyperdynamic function. The left ventricle has no regional wall motion abnormalities.  4. Right ventricular systolic function is normal. The right ventricular size is normal.  5. Mild mitral valve regurgitation.  6. Aortic valve regurgitation is not visualized. There is a 23 mm Inspiris pericardial valve present in the aortic position. Procedure Date: 04/21/2021. Comparison(s): A prior study was performed on 05/02/21. Prior images reviewed side by side. Stable effusion; LV appears hyperdynamic. Hemorrhagic pericardial effusion is on differential given recent SAVR. FINDINGS  Left Ventricle: Left ventricular ejection fraction, by estimation, is 70 to 75%. The left ventricle has hyperdynamic function. The left ventricle has no regional wall motion abnormalities. Right Ventricle: The right ventricular size is normal. Right ventricular systolic function is normal. Pericardium: A moderately sized pericardial effusion is present. The pericardial effusion is circumferential. There is no evidence of cardiac tamponade. Mitral Valve: Mild mitral valve regurgitation. Aortic Valve: Aortic valve regurgitation is not visualized. Aortic valve mean gradient measures 7.0 mmHg. Aortic valve peak gradient measures 16.3 mmHg. There is a 23 mm Inspiris pericardial valve present in the aortic position. Procedure Date: 04/21/2021. Venous: The inferior vena cava is dilated in size with less than 50% respiratory variability, suggesting right atrial pressure of 15 mmHg. AORTIC VALVE AV Vmax:           202.00 cm/s AV Vmean:          125.000 cm/s AV VTI:            0.312 m AV Peak Grad:      16.3 mmHg AV Mean Grad:      7.0 mmHg LVOT Vmax:         102.00 cm/s LVOT Vmean:        68.400 cm/s LVOT VTI:          0.110 m  LVOT/AV VTI ratio: 0.35  SHUNTS Systemic VTI: 0.11 m Rudean Haskell MD Electronically signed by Rudean Haskell MD Signature Date/Time: 05/04/2021/8:26:34 AM    Final    CT ANGIO ABDOMEN PELVIS  W &/OR WO CONTRAST  Result Date: 04/12/2021 CLINICAL DATA:  65 year old female with history of severe aortic stenosis. Preprocedural study prior to potential transcatheter aortic valve replacement (TAVR) procedure. EXAM: CT CHEST, ABDOMEN AND PELVIS WITHOUT CONTRAST TECHNIQUE: Multidetector CT imaging of the chest, abdomen and pelvis was performed following the standard protocol without IV contrast. COMPARISON:  CT the abdomen and pelvis 12/26/2016. No prior chest CT. FINDINGS: CTA CHEST FINDINGS Cardiovascular: Heart size is normal. There is no significant pericardial fluid, thickening or pericardial calcification. There is aortic atherosclerosis, as well as atherosclerosis of the great vessels of the mediastinum and the coronary arteries, including calcified atherosclerotic plaque in the left anterior descending and right coronary arteries. Severe thickening calcification of the aortic valve. Aneurysmal dilatation of the ascending thoracic aorta which measures up to 4.7 cm in diameter. Mediastinum/Lymph Nodes: No pathologically enlarged mediastinal or hilar lymph nodes. Esophagus is unremarkable in appearance. No axillary lymphadenopathy. Lungs/Pleura: No acute consolidative airspace disease. No  pleural effusions. 4 mm right upper lobe pulmonary nodule (axial image 43 of series 5). No other larger more suspicious appearing pulmonary nodules or masses are noted. Musculoskeletal/Soft Tissues: Severe dextroscoliosis of the thoracolumbar spine. There are no aggressive appearing lytic or blastic lesions noted in the visualized portions of the skeleton. CTA ABDOMEN AND PELVIS FINDINGS Hepatobiliary: Diffuse low attenuation throughout the hepatic parenchyma, indicative of a background of hepatic steatosis. No  suspicious cystic or solid hepatic lesions. No intra or extrahepatic biliary ductal dilatation. Gallbladder is normal in appearance. Pancreas: No pancreatic mass. No pancreatic ductal dilatation. No pancreatic or peripancreatic fluid collections or inflammatory changes. Spleen: Unremarkable. Adrenals/Urinary Tract: Bilateral kidneys and adrenal glands are normal in appearance. No hydroureteronephrosis. Urinary bladder is normal in appearance. Stomach/Bowel: Normal appearance of the stomach. No pathologic dilatation of small bowel or colon. Numerous colonic diverticulae are noted, particularly in the sigmoid colon, without surrounding inflammatory changes to suggest an acute diverticulitis at this time. Normal appendix. Vascular/Lymphatic: Aortic atherosclerosis, without evidence of aneurysm or dissection in the abdominal or pelvic vasculature. Vascular findings and measurements pertinent to potential TAVR procedure, as detailed below. High attenuation soft tissue stranding adjacent to the right common femoral artery and proximal right superficial femoral artery, likely to reflect a small postprocedural hematoma. No lymphadenopathy noted in the abdomen or pelvis. Reproductive: Uterus and ovaries are atrophic. Other: No significant volume of ascites.  No pneumoperitoneum. Musculoskeletal: There are no aggressive appearing lytic or blastic lesions noted in the visualized portions of the skeleton. VASCULAR MEASUREMENTS PERTINENT TO TAVR: AORTA: Minimal Aortic Diameter-14 x 12 mm Severity of Aortic Calcification-mild to moderate RIGHT PELVIS: Right Common Iliac Artery - Minimal Diameter-7.1 x 5.9 mm Tortuosity-mild Calcification-mild Right External Iliac Artery - Minimal Diameter-5.8 x 5.4 mm Tortuosity-mild Calcification-none Right Common Femoral Artery - Minimal Diameter-6.6 x 4.7 mm Tortuosity-mild Calcification-mild LEFT PELVIS: Left Common Iliac Artery - Minimal Diameter-7.0 x 8.3 mm Tortuosity-mild  Calcification-mild Left External Iliac Artery - Minimal Diameter-5.8 x 5.8 mm Tortuosity-moderate Calcification-none Left Common Femoral Artery - Minimal Diameter-6.0 x 6.0 mm Tortuosity-mild Calcification-none Review of the MIP images confirms the above findings. IMPRESSION: 1. Vascular findings and measurements pertinent to potential TAVR procedure, as detailed above. 2. Severe thickening calcification of the aortic valve, the appearance of which suggests probable bicuspid valve, compatible with reported clinical history of severe aortic stenosis. 3. Aortic atherosclerosis with 2 vessel coronary artery disease and aneurysmal dilatation of the ascending thoracic aorta (4.7 cm in diameter). Ascending thoracic aortic aneurysm. Recommend semi-annual imaging followup by CTA or MRA and referral to cardiothoracic surgery if not already obtained. This recommendation follows 2010 ACCF/AHA/AATS/ACR/ASA/SCA/SCAI/SIR/STS/SVM Guidelines for the Diagnosis and Management of Patients With Thoracic Aortic Disease. Circulation. 2010; [id number]. Aortic aneurysm NOS (ICD10-I71.9). 4. 4 mm right upper lobe pulmonary nodule, nonspecific, but statistically likely benign. No follow-up needed if patient is low-risk. Non-contrast chest CT can be considered in 12 months if patient is high-risk. This recommendation follows the consensus statement: Guidelines for Management of Incidental Pulmonary Nodules Detected on CT Images: From the Fleischner Society 2017; Radiology 2017; 284:228-243. 5. Severe colonic diverticulosis without evidence of acute diverticulitis at this time. 6. Hepatic steatosis. 7. Additional incidental findings, as above. Electronically Signed   By: Vinnie Langton M.D.   On: 04/12/2021 06:02     Subjective: Eager to go home  Discharge Exam: Vitals:   05/08/21 0151 05/08/21 0736  BP: (!) 158/104 132/84  Pulse: 73 80  Resp: 17 17  Temp: 98 F (  36.7 C) 97.8 F (36.6 C)  SpO2: 97% 95%   Vitals:    05/07/21 1547 05/07/21 2100 05/08/21 0151 05/08/21 0736  BP: (!) 144/75 (!) 151/81 (!) 158/104 132/84  Pulse:  81 73 80  Resp: 16 16 17 17   Temp: 98.6 F (37 C) 98.3 F (36.8 C) 98 F (36.7 C) 97.8 F (36.6 C)  TempSrc: Oral  Oral Oral  SpO2: 95% 94% 97% 95%  Weight:      Height:        General: Pt is alert, awake, not in acute distress Cardiovascular: RRR, S1/S2 + Respiratory: CTA bilaterally, no wheezing, no rhonchi Abdominal: Soft, NT, ND, bowel sounds + Extremities: no edema, no cyanosis   The results of significant diagnostics from this hospitalization (including imaging, microbiology, ancillary and laboratory) are listed below for reference.     Microbiology: Recent Results (from the past 240 hour(s))  Blood culture (routine x 2)     Status: None (Preliminary result)   Collection Time: 05/02/21  7:57 AM   Specimen: BLOOD  Result Value Ref Range Status   Specimen Description BLOOD SITE NOT SPECIFIED  Final   Special Requests   Final    BOTTLES DRAWN AEROBIC AND ANAEROBIC Blood Culture adequate volume   Culture   Final    NO GROWTH 4 DAYS Performed at [facility], [address] [place], [place] [address]    Report Status PENDING  Incomplete  Blood culture (routine x 2)     Status: Abnormal   Collection Time: 05/02/21  8:07 AM   Specimen: BLOOD  Result Value Ref Range Status   Specimen Description BLOOD SITE NOT SPECIFIED  Final   Special Requests   Final    BOTTLES DRAWN AEROBIC AND ANAEROBIC Blood Culture adequate volume   Culture  Setup Time   Final    GRAM POSITIVE COCCI IN CLUSTERS AEROBIC BOTTLE ONLY CRITICAL RESULT CALLED TO, READ BACK BY AND VERIFIED WITH: Rockey Situ PHARMD 1414 05/03/21 A BROWNING    Culture (A)  Final    STAPHYLOCOCCUS EPIDERMIDIS THE SIGNIFICANCE OF ISOLATING THIS ORGANISM FROM A SINGLE SET OF BLOOD CULTURES WHEN MULTIPLE SETS ARE DRAWN IS UNCERTAIN. PLEASE NOTIFY THE MICROBIOLOGY DEPARTMENT WITHIN ONE WEEK IF SPECIATION AND  SENSITIVITIES ARE REQUIRED. Performed at [facility], [place] [address] [place], [place] [address]    Report Status 05/05/2021 FINAL  Final  Blood Culture ID Panel (Reflexed)     Status: Abnormal   Collection Time: 05/02/21  8:07 AM  Result Value Ref Range Status   Enterococcus faecalis NOT DETECTED NOT DETECTED Final   Enterococcus Faecium NOT DETECTED NOT DETECTED Final   Listeria monocytogenes NOT DETECTED NOT DETECTED Final   Staphylococcus species DETECTED (A) NOT DETECTED Final    Comment: CRITICAL RESULT CALLED TO, READ BACK BY AND VERIFIED WITH: Rockey Situ PHARMD 1414 05/03/21 A BROWNING    Staphylococcus aureus (BCID) NOT DETECTED NOT DETECTED Final   Staphylococcus epidermidis DETECTED (A) NOT DETECTED Final    Comment: CRITICAL RESULT CALLED TO, READ BACK BY AND VERIFIED WITH: Rockey Situ PHARMD 1414 05/03/21 A BROWNING    Staphylococcus lugdunensis NOT DETECTED NOT DETECTED Final   Streptococcus species NOT DETECTED NOT DETECTED Final   Streptococcus agalactiae NOT DETECTED NOT DETECTED Final   Streptococcus pneumoniae NOT DETECTED NOT DETECTED Final   Streptococcus pyogenes NOT DETECTED NOT DETECTED Final   A.calcoaceticus-baumannii NOT DETECTED NOT DETECTED Final   Bacteroides fragilis NOT DETECTED NOT  DETECTED Final   Enterobacterales NOT DETECTED NOT DETECTED Final   Enterobacter cloacae complex NOT DETECTED NOT DETECTED Final   Escherichia coli NOT DETECTED NOT DETECTED Final   Klebsiella aerogenes NOT DETECTED NOT DETECTED Final   Klebsiella oxytoca NOT DETECTED NOT DETECTED Final   Klebsiella pneumoniae NOT DETECTED NOT DETECTED Final   Proteus species NOT DETECTED NOT DETECTED Final   Salmonella species NOT DETECTED NOT DETECTED Final   Serratia marcescens NOT DETECTED NOT DETECTED Final   Haemophilus influenzae NOT DETECTED NOT DETECTED Final   Neisseria meningitidis NOT DETECTED NOT DETECTED Final   Pseudomonas aeruginosa NOT DETECTED NOT DETECTED Final    Stenotrophomonas maltophilia NOT DETECTED NOT DETECTED Final   Candida albicans NOT DETECTED NOT DETECTED Final   Candida auris NOT DETECTED NOT DETECTED Final   Candida glabrata NOT DETECTED NOT DETECTED Final   Candida krusei NOT DETECTED NOT DETECTED Final   Candida parapsilosis NOT DETECTED NOT DETECTED Final   Candida tropicalis NOT DETECTED NOT DETECTED Final   Cryptococcus neoformans/gattii NOT DETECTED NOT DETECTED Final   Methicillin resistance mecA/C NOT DETECTED NOT DETECTED Final    Comment: Performed at [facility], [place] [address] [place], [place] [address]  Resp Panel by RT-PCR (Flu A&B, Covid)     Status: None   Collection Time: 05/02/21  5:53 PM   Specimen: Nasopharyngeal(NP) swabs in vial transport medium  Result Value Ref Range Status   SARS Coronavirus 2 by RT PCR NEGATIVE NEGATIVE Final    Comment: (NOTE) SARS-CoV-2 target nucleic acids are NOT DETECTED.  The SARS-CoV-2 RNA is generally detectable in upper respiratory specimens during the acute phase of infection. The lowest concentration of SARS-CoV-2 viral copies this assay can detect is 138 copies/mL. A negative result does not preclude SARS-Cov-2 infection and should not be used as the sole basis for treatment or other patient management decisions. A negative result may occur with  improper specimen collection/handling, submission of specimen other than nasopharyngeal swab, presence of viral mutation(s) within the areas targeted by this assay, and inadequate number of viral copies(<138 copies/mL). A negative result must be combined with clinical observations, patient history, and epidemiological information. The expected result is Negative.  Fact Sheet for Patients:  EntrepreneurPulse.com.au  Fact Sheet for Healthcare Providers:  IncredibleEmployment.be  This test is no t yet approved or cleared by the [place] FDA and  has been authorized for detection  and/or diagnosis of SARS-CoV-2 by FDA under an Emergency Use Authorization (EUA). This EUA will remain  in effect (meaning this test can be used) for the duration of the COVID-19 declaration under Section 564(b)(1) of the Act, 21 U.S.C.section 360bbb-3(b)(1), unless the authorization is terminated  or revoked sooner.       Influenza A by PCR NEGATIVE NEGATIVE Final   Influenza B by PCR NEGATIVE NEGATIVE Final    Comment: (NOTE) The Xpert Xpress SARS-CoV-2/FLU/RSV plus assay is intended as an aid in the diagnosis of influenza from Nasopharyngeal swab specimens and should not be used as a sole basis for treatment. Nasal washings and aspirates are unacceptable for Xpert Xpress SARS-CoV-2/FLU/RSV testing.  Fact Sheet for Patients: EntrepreneurPulse.com.au  Fact Sheet for Healthcare Providers: IncredibleEmployment.be  This test is not yet approved or cleared by the [place] FDA and has been authorized for detection and/or diagnosis of SARS-CoV-2 by FDA under an Emergency Use Authorization (EUA). This EUA will remain in effect (meaning this test can be used) for the duration of the COVID-19 declaration  under Section 564(b)(1) of the Act, 21 U.S.C. section 360bbb-3(b)(1), unless the authorization is terminated or revoked.  Performed at [facility], [place] [address] [place], [place] [address]   Body fluid culture w Gram Stain     Status: None (Preliminary result)   Collection Time: 05/05/21 10:51 AM   Specimen: Pleura; Body Fluid  Result Value Ref Range Status   Specimen Description PLEURAL FLUID  Final   Special Requests NONE  Final   Gram Stain   Final    FEW WBC PRESENT, PREDOMINANTLY PMN NO ORGANISMS SEEN    Culture   Final    NO GROWTH 3 DAYS Performed at [facility], [address] [place], [place] [address]    Report Status PENDING  Incomplete  Culture, blood (single)     Status: None (Preliminary result)    Collection Time: 05/05/21 12:47 PM   Specimen: BLOOD RIGHT HAND  Result Value Ref Range Status   Specimen Description BLOOD RIGHT HAND  Final   Special Requests   Final    BOTTLES DRAWN AEROBIC AND ANAEROBIC Blood Culture results may not be optimal due to an inadequate volume of blood received in culture bottles   Culture   Final    NO GROWTH 3 DAYS Performed at [facility], [place] [address] [place], [place] [address]    Report Status PENDING  Incomplete     Labs: BNP (last 3 results) Recent Labs    05/02/21 0757  BNP [id number]   Basic Metabolic Panel: Recent Labs  Lab 05/04/21 1343 05/05/21 0129 05/05/21 1512 05/06/21 0305 05/07/21 0341 05/08/21 0041  NA  --  131* 132* 131* 133* 133*  K  --  3.1* 3.9 4.0 3.8 3.7  CL  --  95* 96* 97* 97* 97*  CO2  --  27 28 28 28 29   GLUCOSE  --  129* 117* 119* 120* 119*  BUN  --  14 11 9 8 11   CREATININE  --  0.81 0.87 0.70 0.69 0.72  CALCIUM  --  8.3* 8.5* 8.4* 8.6* 8.5*  MG 2.0  --  2.0 1.9 2.3 2.0  PHOS 2.2*  --   --   --  2.7 3.1   Liver Function Tests: Recent Labs  Lab 05/05/21 1247  PROT 5.8*  ALBUMIN 2.9*   No results for input(s): LIPASE, AMYLASE in the last 168 hours. No results for input(s): AMMONIA in the last 168 hours. CBC: Recent Labs  Lab 05/02/21 0757 05/02/21 1539 05/04/21 0013 05/04/21 1343 05/05/21 0129 05/06/21 0305 05/07/21 0341  WBC 32.9*   < > 22.4* 21.9* 20.6* 19.7* 19.2*  NEUTROABS 29.5*  --   --   --   --   --   --   HGB 8.7*   < > 7.8* 9.7* 8.7* 8.6* 9.0*  HCT 26.7*   < > 24.0* 30.1* 26.8* 26.8* 27.5*  MCV 92.7   < > 93.0 93.2 92.4 93.7 93.2  PLT 527*   < > 475* 554* 476* 476* 471*   < > = values in this interval not displayed.   Cardiac Enzymes: No results for input(s): CKTOTAL, CKMB, CKMBINDEX, TROPONINI in the last 168 hours. BNP: Invalid input(s): POCBNP CBG: Recent Labs  Lab 05/05/21 2303 05/06/21 0308 05/06/21 0733 05/06/21 1232 05/06/21 1530  GLUCAP 117* 114* 109*  128* 125*   D-Dimer No results for input(s): DDIMER in the last 72 hours. Hgb A1c No results for input(s):  HGBA1C in the last 72 hours. Lipid Profile Recent Labs    05/05/21 1247  CHOL 85   Thyroid function studies No results for input(s): TSH, T4TOTAL, T3FREE, THYROIDAB in the last 72 hours.  Invalid input(s): FREET3 Anemia work up No results for input(s): VITAMINB12, FOLATE, FERRITIN, TIBC, IRON, RETICCTPCT in the last 72 hours. Urinalysis    Component Value Date/Time   COLORURINE YELLOW 05/02/2021 1112   APPEARANCEUR HAZY (A) 05/02/2021 1112   APPEARANCEUR Clear 07/18/2014 1558   LABSPEC 1.021 05/02/2021 1112   LABSPEC 1.002 07/18/2014 1558   PHURINE 5.0 05/02/2021 1112   GLUCOSEU NEGATIVE 05/02/2021 1112   GLUCOSEU NEGATIVE 12/26/2016 1045   HGBUR MODERATE (A) 05/02/2021 1112   BILIRUBINUR NEGATIVE 05/02/2021 1112   BILIRUBINUR negative 12/26/2016 1056   BILIRUBINUR Negative 07/18/2014 1558   KETONESUR 5 (A) 05/02/2021 1112   PROTEINUR 100 (A) 05/02/2021 1112   UROBILINOGEN 0.2 12/26/2016 1056   UROBILINOGEN 0.2 12/26/2016 1045   NITRITE NEGATIVE 05/02/2021 1112   LEUKOCYTESUR NEGATIVE 05/02/2021 1112   LEUKOCYTESUR Negative 07/18/2014 1558   Sepsis Labs Invalid input(s): PROCALCITONIN,  WBC,  LACTICIDVEN Microbiology Recent Results (from the past 240 hour(s))  Blood culture (routine x 2)     Status: None (Preliminary result)   Collection Time: 05/02/21  7:57 AM   Specimen: BLOOD  Result Value Ref Range Status   Specimen Description BLOOD SITE NOT SPECIFIED  Final   Special Requests   Final    BOTTLES DRAWN AEROBIC AND ANAEROBIC Blood Culture adequate volume   Culture   Final    NO GROWTH 4 DAYS Performed at [facility], [address] [place], [place] [address]    Report Status PENDING  Incomplete  Blood culture (routine x 2)     Status: Abnormal   Collection Time: 05/02/21  8:07 AM   Specimen: BLOOD  Result Value Ref Range Status   Specimen  Description BLOOD SITE NOT SPECIFIED  Final   Special Requests   Final    BOTTLES DRAWN AEROBIC AND ANAEROBIC Blood Culture adequate volume   Culture  Setup Time   Final    GRAM POSITIVE COCCI IN CLUSTERS AEROBIC BOTTLE ONLY CRITICAL RESULT CALLED TO, READ BACK BY AND VERIFIED WITH: Rockey Situ PHARMD 1414 05/03/21 A BROWNING    Culture (A)  Final    STAPHYLOCOCCUS EPIDERMIDIS THE SIGNIFICANCE OF ISOLATING THIS ORGANISM FROM A SINGLE SET OF BLOOD CULTURES WHEN MULTIPLE SETS ARE DRAWN IS UNCERTAIN. PLEASE NOTIFY THE MICROBIOLOGY DEPARTMENT WITHIN ONE WEEK IF SPECIATION AND SENSITIVITIES ARE REQUIRED. Performed at [facility], [place] [address] [place], [place] [address]    Report Status 05/05/2021 FINAL  Final  Blood Culture ID Panel (Reflexed)     Status: Abnormal   Collection Time: 05/02/21  8:07 AM  Result Value Ref Range Status   Enterococcus faecalis NOT DETECTED NOT DETECTED Final   Enterococcus Faecium NOT DETECTED NOT DETECTED Final   Listeria monocytogenes NOT DETECTED NOT DETECTED Final   Staphylococcus species DETECTED (A) NOT DETECTED Final    Comment: CRITICAL RESULT CALLED TO, READ BACK BY AND VERIFIED WITH: Rockey Situ PHARMD 1414 05/03/21 A BROWNING    Staphylococcus aureus (BCID) NOT DETECTED NOT DETECTED Final   Staphylococcus epidermidis DETECTED (A) NOT DETECTED Final    Comment: CRITICAL RESULT CALLED TO, READ BACK BY AND VERIFIED WITHRockey Situ PHARMD 1414 05/03/21 A BROWNING    Staphylococcus lugdunensis NOT DETECTED NOT DETECTED Final   Streptococcus species  NOT DETECTED NOT DETECTED Final   Streptococcus agalactiae NOT DETECTED NOT DETECTED Final   Streptococcus pneumoniae NOT DETECTED NOT DETECTED Final   Streptococcus pyogenes NOT DETECTED NOT DETECTED Final   A.calcoaceticus-baumannii NOT DETECTED NOT DETECTED Final   Bacteroides fragilis NOT DETECTED NOT DETECTED Final   Enterobacterales NOT DETECTED NOT DETECTED Final   Enterobacter cloacae complex NOT DETECTED  NOT DETECTED Final   Escherichia coli NOT DETECTED NOT DETECTED Final   Klebsiella aerogenes NOT DETECTED NOT DETECTED Final   Klebsiella oxytoca NOT DETECTED NOT DETECTED Final   Klebsiella pneumoniae NOT DETECTED NOT DETECTED Final   Proteus species NOT DETECTED NOT DETECTED Final   Salmonella species NOT DETECTED NOT DETECTED Final   Serratia marcescens NOT DETECTED NOT DETECTED Final   Haemophilus influenzae NOT DETECTED NOT DETECTED Final   Neisseria meningitidis NOT DETECTED NOT DETECTED Final   Pseudomonas aeruginosa NOT DETECTED NOT DETECTED Final   Stenotrophomonas maltophilia NOT DETECTED NOT DETECTED Final   Candida albicans NOT DETECTED NOT DETECTED Final   Candida auris NOT DETECTED NOT DETECTED Final   Candida glabrata NOT DETECTED NOT DETECTED Final   Candida krusei NOT DETECTED NOT DETECTED Final   Candida parapsilosis NOT DETECTED NOT DETECTED Final   Candida tropicalis NOT DETECTED NOT DETECTED Final   Cryptococcus neoformans/gattii NOT DETECTED NOT DETECTED Final   Methicillin resistance mecA/C NOT DETECTED NOT DETECTED Final    Comment: Performed at [facility], [address] [place], [place] [address]  Resp Panel by RT-PCR (Flu A&B, Covid)     Status: None   Collection Time: 05/02/21  5:53 PM   Specimen: Nasopharyngeal(NP) swabs in vial transport medium  Result Value Ref Range Status   SARS Coronavirus 2 by RT PCR NEGATIVE NEGATIVE Final    Comment: (NOTE) SARS-CoV-2 target nucleic acids are NOT DETECTED.  The SARS-CoV-2 RNA is generally detectable in upper respiratory specimens during the acute phase of infection. The lowest concentration of SARS-CoV-2 viral copies this assay can detect is 138 copies/mL. A negative result does not preclude SARS-Cov-2 infection and should not be used as the sole basis for treatment or other patient management decisions. A negative result may occur with  improper specimen collection/handling, submission of specimen  other than nasopharyngeal swab, presence of viral mutation(s) within the areas targeted by this assay, and inadequate number of viral copies(<138 copies/mL). A negative result must be combined with clinical observations, patient history, and epidemiological information. The expected result is Negative.  Fact Sheet for Patients:  EntrepreneurPulse.com.au  Fact Sheet for Healthcare Providers:  IncredibleEmployment.be  This test is no t yet approved or cleared by the [place] FDA and  has been authorized for detection and/or diagnosis of SARS-CoV-2 by FDA under an Emergency Use Authorization (EUA). This EUA will remain  in effect (meaning this test can be used) for the duration of the COVID-19 declaration under Section 564(b)(1) of the Act, 21 U.S.C.section 360bbb-3(b)(1), unless the authorization is terminated  or revoked sooner.       Influenza A by PCR NEGATIVE NEGATIVE Final   Influenza B by PCR NEGATIVE NEGATIVE Final    Comment: (NOTE) The Xpert Xpress SARS-CoV-2/FLU/RSV plus assay is intended as an aid in the diagnosis of influenza from Nasopharyngeal swab specimens and should not be used as a sole basis for treatment. Nasal washings and aspirates are unacceptable for Xpert Xpress SARS-CoV-2/FLU/RSV testing.  Fact Sheet for Patients: EntrepreneurPulse.com.au  Fact Sheet for Healthcare Providers: IncredibleEmployment.be  This test is  not yet approved or cleared by the [place] and has been authorized for detection and/or diagnosis of SARS-CoV-2 by FDA under an Emergency Use Authorization (EUA). This EUA will remain in effect (meaning this test can be used) for the duration of the COVID-19 declaration under Section 564(b)(1) of the Act, 21 U.S.C. section 360bbb-3(b)(1), unless the authorization is terminated or revoked.  Performed at [facility], [place] [address], [place],  [place] [address]   Body fluid culture w Gram Stain     Status: None (Preliminary result)   Collection Time: 05/05/21 10:51 AM   Specimen: Pleura; Body Fluid  Result Value Ref Range Status   Specimen Description PLEURAL FLUID  Final   Special Requests NONE  Final   Gram Stain   Final    FEW WBC PRESENT, PREDOMINANTLY PMN NO ORGANISMS SEEN    Culture   Final    NO GROWTH 3 DAYS Performed at [facility], [address] [place], [place] [address]    Report Status PENDING  Incomplete  Culture, blood (single)     Status: None (Preliminary result)   Collection Time: 05/05/21 12:47 PM   Specimen: BLOOD RIGHT HAND  Result Value Ref Range Status   Specimen Description BLOOD RIGHT HAND  Final   Special Requests   Final    BOTTLES DRAWN AEROBIC AND ANAEROBIC Blood Culture results may not be optimal due to an inadequate volume of blood received in culture bottles   Culture   Final    NO GROWTH 3 DAYS Performed at [facility], [place] [address] [place], [place] [address]    Report Status PENDING  Incomplete   Time spent: 63min  SIGNED:   Marylu Lund, MD  [facility] 05/08/2021, 12:19 PM  If 7PM-7AM, please contact night-coverage

## 2021-05-09 ENCOUNTER — Other Ambulatory Visit: Payer: Self-pay | Admitting: Surgery

## 2021-05-09 ENCOUNTER — Telehealth: Payer: Self-pay

## 2021-05-09 ENCOUNTER — Ambulatory Visit: Payer: Medicare Other | Admitting: Physician Assistant

## 2021-05-09 DIAGNOSIS — Q230 Congenital stenosis of aortic valve: Secondary | ICD-10-CM

## 2021-05-09 NOTE — Telephone Encounter (Signed)
Transition Care Management Unsuccessful Follow-up Telephone Call  Date of discharge and from where:  05/08/21 from [facility]  Attempts:  1st Attempt  Reason for unsuccessful TCM follow-up call:  Unable to leave message. Will follow.

## 2021-05-09 NOTE — Progress Notes (Deleted)
Cardiology Office Note    Date:  05/09/2021   ID:  Deborah Koch, Deborah Koch 1956-11-24, MRN [id number]  PCP:  Burnard Hawthorne, FNP  Cardiologist:  Ida Rogue, MD  Electrophysiologist:  None   Chief Complaint: Hospital follow-up  History of Present Illness:   Deborah Koch is a 66 y.o. female with history of nonobstructive CAD by [place] in 03/2021 as outlined below, bicuspid aortic valve with severe aortic stenosis status post recent bioprosthetic AVR/aortic root replacement on 5/0/5397 complicated by pericardial effusion possibly hemorrhagic, A. fib with RVR, acute hypoxic respiratory failure, pleural effusions requiring right-sided thoracentesis, possible perisplenic hematoma, encephalopathy, and AKI who presents for hospital follow-up after recent repeat admission as detailed below.  ***  She underwent elective bioprosthetic aortic valve replacement, ascending aorta, and aortic arch replacement with Hemashield graft and aorto innominate/aorto left carotid bypass on 04/21/2021.  Initially, her postoperative course was largely unremarkable with the exception of hypertension and she was subsequently discharged home on 04/27/2021.  She was readmitted to the hospital on 05/02/2021 with presumed sepsis of uncertain etiology, acute metabolic encephalopathy, acute hypoxic respiratory failure, pericardial effusion status post AVR and ascending aortic repair felt to possibly be hemorrhagic without evidence of tamponade or clinical decompensation, bilateral pleural effusions status post right-sided thoracentesis consistent with exudative etiology and cytology negative for malignancy, splenic subcapsular hematoma, A. fib with RVR, anemia and hyponatremia.  She underwent repeat echo on 05/02/2021 which showed an EF of 65 to 70%, no regional wall motion abnormalities, grade 1 diastolic dysfunction, normal RV systolic function and ventricular cavity size, severely dilated left atrium, mildly dilated  right atrium, moderate pericardial effusion surrounding the heart with some consolidation that did not appear to be hemodynamically compromising by echo criteria except for a noted dilated IVC, mild mitral regurgitation, moderate tricuspid regurgitation, and a well-seated, normally functioning bioprosthetic aortic valve, and an estimated right atrial pressure of 15 mmHg.  Repeat limited echo on 05/04/2021 showed a circumferential pericardial effusion without evidence of increase in pericardial pressure (there was IVC dilation) without evidence of RV collapse and an echodensity within the effusion, estimated right atrial pressure 50 mmHg, EF 70-75%, no regional wall motion abnormalities, normal RV systolic function and ventricular cavity size, mild mitral regurgitation.  BNP 823.  High-sensitivity troponin peaked at 172.  She was evaluated by both cardiology and cardiothoracic surgery with conservative management of her pericardial effusion recommended.  Throughout her admission she had issues with A. fib with RVR with anticoagulation being contraindicated in the setting of pericardial effusion presumed to be hemopericardium and subcapsular splenic hematoma.  Initial blood cultures positive for staph epidermis, possibly contaminant with repeat blood cultures being negative.  She was treated with IV antibiotics for presumed sepsis of uncertain etiology with these being discontinued at discharge.  ***   Labs independently reviewed: 04/2021 - magnesium 2.0, potassium 3.7, BUN 11, serum creatinine 0.72, Hgb 9.0, PLT 471, albumin 2.9, AST/ALT normal, A1c 5.2 01/2021 - TC 187, TG 57, HDL 93, LDL 82 04/2020 - TSH normal  Past Medical History:  Diagnosis Date  . Anxiety   . C. difficile colitis    a. 07/2014.  [name] Congenital bicuspid aortic valve   . Depression   . Dilated Ascending Aorta    a. 12/2013 Echo: 4.45cm.  . Diverticulitis    hospitalized; subsequent c diff infection.   [name] GERD (gastroesophageal reflux  disease)   . H/O mastitis 2015  . Heart murmur   . History of  tobacco abuse    a. 20 yrs, 1/4 ppd - quit.  [name] HTN (hypertension)   . Hypokalemia   . Moderate to severe aortic stenosis    a. 12/2013 Echo: EF 60-65%, Grade 1 DD, bicuspid AoV with mod-sev AS [62mmHg mean gradient, AoV area 0.69cm^2 (VTI)], mod dil ascending Ao - 4.45cm, mild TR, PASP 70mmHg.  . Multilevel degenerative disc disease   . Pneumonia    at age 38  . Scoliosis     Past Surgical History:  Procedure Laterality Date  . AORTA -INNOMIATE BYPASS N/A 04/21/2021   Procedure: AORTA -Titus Mould BYPASS;  Surgeon: Gaye Pollack, MD;  Location: [facility] OR;  Service: Vascular;  Laterality: N/A;  . AORTIC VALVE REPLACEMENT N/A 04/21/2021   Procedure: AORTIC VALVE REPLACEMENT (AVR) ON PUMP WITH 23MM INSPIRIS AORTIC VALVE;  Surgeon: Gaye Pollack, MD;  Location: [facility];  Service: Open Heart Surgery;  Laterality: N/A;  . BARTHOLIN GLAND CYST EXCISION    . BREAST BIOPSY Left    neg  . CAROTID-SUBCLAVIAN BYPASS GRAFT Left 04/21/2021   Procedure: AORTA LEFT COMMON CAROTID BYPASS;  Surgeon: Gaye Pollack, MD;  Location: [facility] OR;  Service: Vascular;  Laterality: Left;  . COLONOSCOPY  2013   Dr. Allen Norris  . COLONOSCOPY WITH PROPOFOL N/A 10/23/2017   Procedure: COLONOSCOPY WITH PROPOFOL;  Surgeon: Lucilla Lame, MD;  Location: [facility] END[facility]COPY;  Service: Endoscopy;  Laterality: N/A;  . OOPHORECTOMY  2016  . OVARY SURGERY  2016   cyst on right ovary  . REPLACEMENT ASCENDING AORTA N/A 04/21/2021   Procedure: REPLACEMENT ASCENDING & ARCH AORTA WITH HEMASHIELD PLATINUM GRAFT 28 [id number];  Surgeon: Gaye Pollack, MD;  Location: [facility];  Service: Open Heart Surgery;  Laterality: N/A;  CIRC ARREST  RIGHT AXILLARY ARTERY CANNULATION  . RIGHT/LEFT HEART CATH AND CORONARY ANGIOGRAPHY N/A 03/24/2021   Procedure: RIGHT/LEFT HEART CATH AND CORONARY ANGIOGRAPHY;  Surgeon: Minna Merritts, MD;  Location: [facility] LAB;  Service: Cardiovascular;   Laterality: N/A;  . SALPINGECTOMY Bilateral   . TEE WITHOUT CARDIOVERSION N/A 04/21/2021   Procedure: TRANSESOPHAGEAL ECHOCARDIOGRAM (TEE);  Surgeon: Gaye Pollack, MD;  Location: [facility];  Service: Open Heart Surgery;  Laterality: N/A;    Current Medications: No outpatient medications have been marked as taking for the 05/09/21 encounter (Appointment) with Rise Mu, PA-C.    Allergies:   Hydrocodone and Iodine   Social History   Socioeconomic History  . Marital status: Married    Spouse name: Not on file  . Number of children: Not on file  . Years of education: Not on file  . Highest education level: Not on file  Occupational History  . Not on file  Tobacco Use  . Smoking status: Light Tobacco Smoker    Packs/day: 0.25    Years: 20.00    Pack years: 5.00    Types: Cigarettes  . Smokeless tobacco: Never Used  Vaping Use  . Vaping Use: Never used  Substance and Sexual Activity  . Alcohol use: Yes    Alcohol/week: 12.0 standard drinks    Types: 10 Glasses of wine, 2 Cans of beer per week    Comment:  2-3 glasses of wine every other evening  . Drug use: No  . Sexual activity: Yes    Birth control/protection: None  Other Topics Concern  . Not on file  Social History Narrative   Moved to [place] 07/2020   Separated from second husband.  Sister is patient of mine      Divorced first husband ( husband had drug addiction)       2 children who live in [place]      Not working since she got C.diff infection 2015 while working at [employer].      Started yoga   Social Determinants of Health   Financial Resource Strain: Not on file  Food Insecurity: Not on file  Transportation Needs: Not on file  Physical Activity: Not on file  Stress: Not on file  Social Connections: Not on file     Family History:  The patient's family history includes Alcohol abuse in her brother; Anxiety disorder in her mother and sister; Breast cancer in her maternal aunt; Colon  cancer in her mother; Depression in her mother and sister; Heart failure in her father; Hypertension in her father and mother; Schizophrenia in her brother. There is no history of Heart attack or Stroke.  ROS:   ROS   EKGs/Labs/Other Studies Reviewed:    Studies reviewed were summarized above. The additional studies were reviewed today:  Limited echo 05/04/2021: 1. Limited study to evaluate moderate pericardial effusion. The  pericardial effusion is circumferential. There is no evidence of increase  pericardial pressure, the there is IVC dilation. In RV focus view, RV has  delayed expansion but not evidence of  collapse. There is echodensity within the effusion best seen in the [facility]  view.  2. The inferior vena cava is dilated in size with <50% respiratory  variability, suggesting right atrial pressure of 15 mmHg.  3. Left ventricular ejection fraction, by estimation, is 70 to 75%. The  left ventricle has hyperdynamic function. The left ventricle has no  regional wall motion abnormalities.  4. Right ventricular systolic function is normal. The right ventricular  size is normal.  5. Mild mitral valve regurgitation.  6. Aortic valve regurgitation is not visualized. There is a 23 mm  Inspiris pericardial valve present in the aortic position. Procedure Date:  04/21/2021.  __________  2D echo 05/02/2021: 1. Left ventricular ejection fraction, by estimation, is 65 to 70%. The  left ventricle has normal function. The left ventricle has no regional  wall motion abnormalities. Left ventricular diastolic parameters are  consistent with Grade I diastolic  dysfunction (impaired relaxation).  2. Right ventricular systolic function is normal. The right ventricular  size is normal.  3. Left atrial size was severely dilated.  4. Right atrial size was mildly dilated.  5. Moderate pericardial effusion surrounds heart with some consolidation  (? clot) Except for dilated IVC, effusion  does not appear to be  hemodynamically compromising by echo criteria.. Moderate pericardial  effusion.  6. Mild mitral valve regurgitation.  7. TR is eccentric, directed toward interatrial septum.. Tricuspid valve  regurgitation is moderate.  8. S/p AVR with 23 mm Edwards pericaridal tissue valve (04/21/21) Valve  appears well seated. Peak and mean gradients through the valve are 29 adn  12 mm Hg respectively Dimensionless index is 0.54.[name] The aortic valve has  been repaired/replaced. Aortic valve  regurgitation is not visualized.  9. The inferior vena cava is dilated in size with <50% respiratory  variability, suggesting right atrial pressure of 15 mmHg. ___________  Intraoperative TEE 04/21/2021: POST-OP IMPRESSIONS  - Left Ventricle: The left ventricle is unchanged from pre-bypass.  - Right Ventricle: The right ventricle appears unchanged from pre-bypass.  - Aorta: A graft was placed in the ascending aorta for repair.  - Left Atrium: The  left atrium appears unchanged from pre-bypass.  - Left Atrial Appendage: The left atrial appendage appears unchanged from  pre-bypass.  - Aortic Valve: A bovine bioprosthetic valve was placed, leaflets are  freely  mobile and leaflets thin Size; 23mm.  - Mitral Valve: The mitral valve appears unchanged from pre-bypass.  - Tricuspid Valve: The tricuspid valve appears unchanged from pre-bypass.  - Pulmonic Valve: The pulmonic valve appears unchanged from pre-bypass.  - Interatrial Septum: The interatrial septum appears unchanged from  pre-bypass.  - Interventricular Septum: The interventricular septum appears unchanged  from  pre-bypass.  - Pericardium: The pericardium appears unchanged from pre-bypass. __________  Coronary morphology/CTA 03/22/2021: Aortic Valve Calcium score: 635  Aortic annular dimension:  Phase assessed: 10%  Annular area: 423 mm2  Annular perimeter: 73.2 mm  Max diameter: 24.1 mm  Min diameter: 22.7  mm  Annular and subannular calcification: Minimal annular calcification under the anterior commissure.  Optimal coplanar projection: LAO 4 CRA 10  Coronary Artery Height above Annulus:  Left Main: 16.9 mm  Right Coronary: 12.0 mm  Sinus of Valsalva Measurements:  Inter-commissure: 28 mm  Maximal diameter: 34 mm  Sinus of Valsalva Height:  Right lateral: 16.6 mm  Left lateral: 22.9 mm  Sinotubular Junction: 36 mm  Ascending Thoracic Aorta: Aneurysmal up to 47 mm (double-oblique).  Coronary Arteries: Normal coronary origin. Right dominance. The study was performed without use of NTG and is insufficient for plaque evaluation. Please refer to recent cardiac catheterization for coronary assessment. 3-vessel coronary calcifications noted.  Cardiac Morphology:  Right Atrium: Right atrial size is within normal limits.  Right Ventricle: The right ventricular cavity is within normal limits.  Left Atrium: Left atrial size is normal in size with no left atrial appendage filling defect.  Left Ventricle: The ventricular cavity size is within normal limits. There are no stigmata of prior infarction. There is no abnormal filling defect. Normal left ventricular function, LVEF 72%. No regional wall motion abnormalities.  Pulmonary arteries: Normal in size without proximal filling defect.  Pulmonary veins: Normal pulmonary venous drainage.  Pericardium: Normal thickness with no significant effusion or calcium present.  Mitral Valve: The mitral valve is normal structure without significant calcification.  Extra-cardiac findings: See attached radiology report for non-cardiac structures.  IMPRESSION: 1. The aortic valve is a 2-sinus bicuspid valve with latero-lateral configuration without raphe. Bulky calcification noted in the anterior commissure.  2. Annular measurements appropriate for 23 mm Sapien 3 TAVR (423 mm2).  3. Minimal annular  calcification noted.  4. Sufficient coronary to annulus distance.  5. Optimal Fluoroscopic Angle for Delivery: LAO 4 CRA 10  6. Aneurysm of the ascending aorta up to 47 mm (double-oblique). Tortuous descending aorta noted.  7. 3-vessel coronary calcifications. __________  [facility] 03/24/2021: Coronary dominance: Right  Left mainstem:   Large vessel that bifurcates into the LAD and left circumflex, no significant disease noted  Left anterior descending (LAD):   Large vessel that extends to the apical region, diagonal branch 2 of moderate size, no significant disease noted  Left circumflex (LCx):  Large vessel with OM branch 2, no significant disease noted  Right coronary artery (RCA):  Right dominant vessel with PL and PDA, no significant disease noted  Left ventriculography: Unable to cross the aortic valve secondary to aortic valve stenosis, straight wire used, multiple attempts, versa core wire used, unsuccessful crossing  Right heart catheterization pressures Wedge 7 PA 27/9/18 RV 28/1/7 RA 5 Ao 140/77/75 Cardiac output 3.76, cardiac index 2.36  Final Conclusions:   Nonobstructive coronary disease Unable to cross aortic valve secondary to aortic valve stenosis Normal right heart pressures  Recommendations:  We will discuss case with TAVR team in [place] Significant scoliosis,  will likely need CT given dilated aorta TEE could be performed for better visualization of the valve __________  2D echo 03/08/2021: 1. Left ventricular ejection fraction, by estimation, is 60 to 65%. The  left ventricle has normal function. The left ventricle has no regional  wall motion abnormalities. There is mild left ventricular hypertrophy.  Left ventricular diastolic parameters  are consistent with Grade I diastolic dysfunction (impaired relaxation).  2. Right ventricular systolic function is normal. The right ventricular  size is normal. There is normal pulmonary artery systolic  pressure.  3. Left atrial size was mildly dilated.  4. The mitral valve is normal in structure. Mild mitral valve  regurgitation. No evidence of mitral stenosis.  5. Tricuspid valve regurgitation is moderate.  6. The aortic valve is bicuspid. There is moderate thickening of the  aortic valve. Aortic valve regurgitation is trivial. Severe aortic valve  stenosis. Aortic valve area, by VTI measures 0.50 cm. Aortic valve mean  gradient measures 45.0 mmHg. Aortic  valve Vmax measures 4.42 m/s.  7. Aortic dilatation noted. There is mild dilatation of the ascending  aorta, measuring 41 mm.  8. The inferior vena cava is normal in size with greater than 50%  respiratory variability, suggesting right atrial pressure of 3 mmHg. __________  2D echo 07/30/2019: 1. The left ventricle has normal systolic function with an ejection  fraction of 60-65%. The cavity size was normal. Left ventricular diastolic  Doppler parameters are consistent with impaired relaxation.  2. The right ventricle has normal systolic function. The cavity was  normal. There is no increase in right ventricular wall thickness. Right  ventricular systolic pressure is mildly elevated with an estimated  pressure of 38.2 mmHg.  3. Left atrial size was mildly dilated.  4. Tricuspid valve regurgitation is mild-moderate.  5. The aortic valve is bicuspid. Moderate thickening of the aortic valve.  Aortic valve regurgitation is mild by color flow Doppler. Moderate-severe  stenosis of the aortic valve by Mean gradient of 30 mm Hg, peak gradient  56 mm Hg, peak velocity 376  cm/sec, Severe AS by estimated AVA 0.84 cm sq  6. There is dilatation of the aortic root 3.2 cm and of the ascending  aorta 4.5 cm.  7. No significant change in aorta dilation, severity of aortic valve  stenosis when compared to study dated 06/2018.  __________  2D echo 07/05/2018: - Left ventricle: The cavity size was normal. Wall thickness was   normal. Systolic function was normal. The estimated ejection  fraction was in the range of 55% to 60%. Wall motion was normal;  there were no regional wall motion abnormalities. Doppler  parameters are consistent with abnormal left ventricular  relaxation (grade 1 diastolic dysfunction).  - Aortic valve: Bicuspid; moderately thickened, moderately  calcified leaflets. There was moderate to severe stenosis. Mean  gradient (S): 29 mm Hg. Valve area (VTI): 0.8 cm^2.  - Aorta: Ascending aortic diameter: 44 mm (S).  - Mitral valve: There was mild regurgitation.  - Tricuspid valve: There was mild-moderate regurgitation.  - Pulmonary arteries: Systolic pressure was within the normal  range.   Impressions:   - Consider imaging of ascending aorta. ___________  2D echo 04/26/2017: - Left ventricle: The cavity size was normal. Wall thickness was  normal. Systolic function  was normal. The estimated ejection  fraction was in the range of 55% to 60%. Wall motion was normal;  there were no regional wall motion abnormalities. Doppler  parameters are consistent with abnormal left ventricular  relaxation (grade 1 diastolic dysfunction).  - Aortic valve: Bicuspid. There was moderate to severe stenosis.  Peak velocity (S): 370 cm/s. Mean gradient (S): 28 mm Hg. VTI  ratio of LVOT to aortic valve: 0.27.  - Mitral valve: There was mild regurgitation.  - Left atrium: The atrium was mildly dilated.  - Tricuspid valve: There was moderate regurgitation. ___________  2D echo 10/29/2015: - Left ventricle: The cavity size was normal. Wall thickness was  normal. Systolic function was normal. The estimated ejection  fraction was in the range of 55% to 65%. Wall motion was normal;  there were no regional wall motion abnormalities. Doppler  parameters are consistent with abnormal left ventricular  relaxation (grade 1 diastolic dysfunction).  - Aortic valve: Bicuspid;  mildly thickened, mildly calcified  leaflets. There was moderate to severe stenosis. Mean gradient  (S): 31 mm Hg. Valve area (VTI): 0.86 cm^2.  - Pulmonary arteries: Systolic pressure was mildly increased. PA  peak pressure: 37 mm Hg (S).   Impressions:   - Compared to echo from last year, aortic valve gradient slightely  increased. Ascending aorta is not well visualized on current  study.  __________  2D echo 08/25/2014: - Left ventricle: The cavity size was normal. Wall thickness was  normal. Systolic function was normal. The estimated ejection  fraction was in the range of 55% to 60%. Wall motion was normal;  there were no regional wall motion abnormalities. Doppler  parameters are consistent with abnormal left ventricular  relaxation (grade 1 diastolic dysfunction).  - Aortic valve: Bicuspid; mildly calcified leaflets. There was  moderate to severe stenosis. Mean gradient (S): 23 mm Hg. Valve  area (VTI): 0.87 cm^2.  - Aorta: Dilated ascending aorta (4.15 cm).  - Tricuspid valve: There was mild-moderate regurgitation. __________  2D echo 01/06/2014: - Left ventricle: The cavity size was normal. Wall thickness  was normal. Systolic function was normal. The estimated  ejection fraction was in the range of 60% to 65%. Wall  motion was normal; there were no regional wall motion  abnormalities. Doppler parameters are consistent with  abnormal left ventricular relaxation (grade 1 diastolic  dysfunction).  - Aortic valve: Bicuspid; mildly thickened leaflets. Valve  mobility was restricted. There was moderate to severe  stenosis. Mean gradient: 57mm Hg (S). Valve area:  0.69cm^2(VTI). The stenosis is moderate by gradient but  severe by calculated valve area.  - Aorta: The ascending aorta wasmoderately dilated (4.45  cm).  - Tricuspid valve: Mild regurgitation.  - Pulmonary arteries: Systolic pressure was mildly  increased. PA peak  pressure: 63mm Hg (S).    EKG:  EKG is ordered today.  The EKG ordered today demonstrates ***  Recent Labs: 04/21/2021: ALT 13 05/02/2021: B Natriuretic Peptide 823.2 05/07/2021: Hemoglobin 9.0; Platelets 471 05/08/2021: BUN 11; Creatinine, Ser 0.72; Magnesium 2.0; Potassium 3.7; Sodium 133  Recent Lipid Panel    Component Value Date/Time   CHOL 85 05/05/2021 1247   TRIG 57.0 02/08/2021 0959   HDL 93.50 02/08/2021 0959   CHOLHDL 2 02/08/2021 0959   VLDL 11.4 02/08/2021 0959   LDLCALC 82 02/08/2021 0959    PHYSICAL EXAM:    VS:  There were no vitals taken for this visit.  BMI: There is no height or weight on file to  calculate BMI.  Physical Exam  Wt Readings from Last 3 Encounters:  05/07/21 137 lb (62.1 kg)  04/27/21 138 lb 4.8 oz (62.7 kg)  04/19/21 134 lb 1 oz (60.8 kg)     ASSESSMENT & PLAN:   1. Pericardial effusion, possibly hemorrhagic, status post AVR and ascending aortic repair:  2. PAF: ***.  CHADS2VASc at least 4 (HTN, age x 1, vascular disease, sex category).  3. Aortic valve replacement with ascending aortic repair:  4. Bilateral pleural effusions:  5. Nonobstructive CAD involving the native coronary arteries with history of elevated troponin:  6. AKI/hyponatremia/hypokalemia:  7. Anemia:  8. Leukocytosis:  9. HTN: Blood pressure ***  10. HLD: LDL 82 in 01/2021.  Disposition: F/u with Dr. Rockey Situ or an APP in ***.   Medication Adjustments/Labs and Tests Ordered: Current medicines are reviewed at length with the patient today.  Concerns regarding medicines are outlined above. Medication changes, Labs and Tests ordered today are summarized above and listed in the Patient Instructions accessible in Encounters.   Signed, Christell Faith, PA-C 05/09/2021 7:25 AM     [place] [employer] [place], [place] [address] [phone]

## 2021-05-10 LAB — CULTURE, BLOOD (SINGLE): Culture: NO GROWTH

## 2021-05-10 NOTE — Telephone Encounter (Signed)
Transition Care Management Unsuccessful Follow-up Telephone Call  Date of discharge and from where:  05/08/21 from [facility]  Attempts:  2nd Attempt  Reason for unsuccessful TCM follow-up call:  Left voice message  Will follow as appropriate.

## 2021-05-11 NOTE — Telephone Encounter (Signed)
Transition Care Management Follow-up Telephone Call  Date of discharge and from where: 05/08/21 from [facility]  How have you been since you were released from the hospital? [facility] on incision site after coming home the first time. Sought medical care. Now taking tylenol extra strength for pain but would like to have something stronger. Winded but can breathe better and feeling better overall. Steady on feet with walker. Denies N/V/D, fever. Incision site clean and dry.   Any questions or concerns? Yes, Requests pain medication other than Tylenol. Okay to wait until scheduled appointment to discuss. Refill Klonopin for anxiety. Appointment scheduled.   Items Reviewed:  Did the pt receive and understand the discharge instructions provided? Yes   Medications obtained and verified? Yes   Other? Yes   Any new allergies since your discharge? No   Dietary orders reviewed? Yes  Do you have support at home? Yes   Home Care and Equipment/Supplies: Were home health services ordered? Yes Awaiting in home assessment. Notes HH to visit several times through the week, monitor vital signs. HHPT not yet scheduled.   Functional Questionnaire: (I = Independent and D = Dependent) ADLs: Family/sister assist as needed   Maintaining continence- I  Transferring/Ambulation- walker  Managing Meds- I  Follow up appointments reviewed:   PCP Hospital f/u appt confirmed? Yes  Scheduled to see Pcp on 05/20/21 @ 9:00 virtual unless otherwise directed.  [facility] f/u appt confirmed? Yes  Scheduled to see Dr. Rockey Situ on 05/12/21.   Are transportation arrangements needed? No   If their condition worsens, is the pt aware to call PCP or go to the Emergency Dept.? Yes  Was the patient provided with contact information for the PCP's office or ED? Yes  Was to pt encouraged to call back with questions or concerns? Yes

## 2021-05-12 ENCOUNTER — Ambulatory Visit (INDEPENDENT_AMBULATORY_CARE_PROVIDER_SITE_OTHER): Payer: Medicare Other

## 2021-05-12 ENCOUNTER — Other Ambulatory Visit: Payer: Self-pay

## 2021-05-12 DIAGNOSIS — Z9889 Other specified postprocedural states: Secondary | ICD-10-CM | POA: Diagnosis not present

## 2021-05-12 DIAGNOSIS — Z8679 Personal history of other diseases of the circulatory system: Secondary | ICD-10-CM | POA: Diagnosis not present

## 2021-05-12 DIAGNOSIS — Z952 Presence of prosthetic heart valve: Secondary | ICD-10-CM | POA: Diagnosis not present

## 2021-05-13 ENCOUNTER — Other Ambulatory Visit: Payer: Self-pay | Admitting: *Deleted

## 2021-05-13 LAB — ECHOCARDIOGRAM COMPLETE
AR max vel: 1.63 cm2
AV Area VTI: 1.76 cm2
AV Area mean vel: 1.76 cm2
AV Mean grad: 8 mmHg
AV Peak grad: 17.6 mmHg
Ao pk vel: 2.1 m/s
Area-P 1/2: 2.88 cm2
S' Lateral: 2.9 cm

## 2021-05-13 MED ORDER — TRAMADOL HCL 50 MG PO TABS: 50.0000 mg | ORAL_TABLET | Freq: Four times a day (QID) | ORAL | 0 refills | Status: DC | PRN

## 2021-05-13 NOTE — Progress Notes (Signed)
Deborah Koch contacted the office stating she is experiencing pain at her sternal incision site. Patient is s/p AVR with Dr. Cyndia Bent 5/5. Patient states she recently fell which resulted in a hospital stay. Per patient she is still hurting in her chest from the fall. Patient states she has been taking Tylenol every 4 hours but it is ineffective in easing the pain. Patient requests Percocet stating Tramadol doesn't help much. Per Josie Saunders, PA, Tramadol is the only thing she is willing to prescribe aside from Tylenol. Patient willing to try Tramadol at this time. Prescription sent in per D. Tacy Dura to patient's preferred pharmacy.

## 2021-05-14 DIAGNOSIS — R0902 Hypoxemia: Secondary | ICD-10-CM | POA: Diagnosis not present

## 2021-05-14 DIAGNOSIS — R531 Weakness: Secondary | ICD-10-CM | POA: Diagnosis not present

## 2021-05-14 DIAGNOSIS — I10 Essential (primary) hypertension: Secondary | ICD-10-CM | POA: Diagnosis not present

## 2021-05-16 ENCOUNTER — Other Ambulatory Visit: Payer: Self-pay | Admitting: Family

## 2021-05-16 DIAGNOSIS — F32A Depression, unspecified: Secondary | ICD-10-CM

## 2021-05-17 DIAGNOSIS — Q230 Congenital stenosis of aortic valve: Secondary | ICD-10-CM | POA: Diagnosis not present

## 2021-05-17 DIAGNOSIS — Q231 Congenital insufficiency of aortic valve: Secondary | ICD-10-CM | POA: Diagnosis not present

## 2021-05-17 NOTE — Telephone Encounter (Signed)
noted 

## 2021-05-17 NOTE — Telephone Encounter (Signed)
Last filled 04/12/21 last OV 02/08/21 has appt schedule 05/19/21

## 2021-05-18 ENCOUNTER — Other Ambulatory Visit: Payer: Self-pay | Admitting: Surgery

## 2021-05-18 DIAGNOSIS — Z9889 Other specified postprocedural states: Secondary | ICD-10-CM

## 2021-05-18 DIAGNOSIS — Z8679 Personal history of other diseases of the circulatory system: Secondary | ICD-10-CM

## 2021-05-19 ENCOUNTER — Other Ambulatory Visit: Payer: Self-pay

## 2021-05-19 ENCOUNTER — Other Ambulatory Visit: Payer: Self-pay | Admitting: Physician Assistant

## 2021-05-19 ENCOUNTER — Ambulatory Visit
Admission: RE | Admit: 2021-05-19 | Discharge: 2021-05-19 | Disposition: A | Discharge status: Home / Self Care | Payer: Medicare Other | Source: Ambulatory Visit | Attending: Surgery | Admitting: Surgery

## 2021-05-19 ENCOUNTER — Ambulatory Visit (INDEPENDENT_AMBULATORY_CARE_PROVIDER_SITE_OTHER): Payer: Self-pay | Admitting: Surgery

## 2021-05-19 ENCOUNTER — Encounter: Payer: Self-pay | Admitting: Surgery

## 2021-05-19 VITALS — BP 163/103 | HR 70 | Resp 20 | Ht 62.0 in | Wt 138.0 lb

## 2021-05-19 DIAGNOSIS — Z9889 Other specified postprocedural states: Secondary | ICD-10-CM

## 2021-05-19 DIAGNOSIS — Z8679 Personal history of other diseases of the circulatory system: Secondary | ICD-10-CM

## 2021-05-19 DIAGNOSIS — Z952 Presence of prosthetic heart valve: Secondary | ICD-10-CM

## 2021-05-19 DIAGNOSIS — J90 Pleural effusion, not elsewhere classified: Secondary | ICD-10-CM | POA: Diagnosis not present

## 2021-05-19 NOTE — Progress Notes (Signed)
HPI: Patient returns for routine postoperative follow-up having undergone replacement of the ascending aorta and aortic arch with aorto-innominate and aorto-left carotid bypass with a supra coronary anastomosis as well as aortic valve replacement using a 23 mm Edwards pericardial valve on 04/21/2021.  The patient's early postoperative recovery while in the hospital was notable for an uncomplicated postoperative course.  She was discharged on 04/27/2021.  She was readmitted on 05/02/2021 after presenting with complaints of chest pain, shortness of breath, nausea, and dizziness.  She had a mechanical fall onto her chest.  In the emergency room she was found to be hypertensive.  White blood cell count was elevated to 32.9.  Hematocrit was 26.7 and was stable from discharge.  Lactic acid and procalcitonin were both elevated.  CXR showed small but stable pleural effusions with bibasilar consolidation/atelectasis.  A noncontrast CT scan of the chest showed a moderate sized pericardial effusion with density suggestive of hemopericardium.  Small bilateral pleural effusions were also confirmed on the CT scan.  A follow-on CT angiogram was obtained and showed no evidence of aortic dissection.  The report mentioned a 10 x 9 mm outpouching of contrast arising from the transverse arch inferiorly which is a ligated sidebranch of the Hemashield graft.    There is also some density around the spleen that looks like a subcapsular hematoma.  An echocardiogram showed a moderate pericardial effusion posteriorly with no signs of tamponade.  The aortic valve prosthesis was functioning normally with a mean gradient of 12 mmHg.  There is no aortic insufficiency.  Left and right ventricular systolic function were normal.  She was admitted with a diagnosis of possible sepsis.  1 out of 4 blood cultures grew out staph epi that was likely contaminant.  There are no other positive cultures.  She was maintained on broad-spectrum  antibiotics.  She did develop some ICU delirium which gradually improved.  She developed atrial fibrillation and was converted with amiodarone.  She was not started on anticoagulants due to a question of hemopericardium.  Her white blood cell count gradually decreased to about 19,000 and remained stable.  Her mental status improved and she was up walking around and was discharged home on 05/08/2021.  She is here today with her sister.  She is currently living with another sister in [place].  The patient reports that she remains weak and feels like she wants to sleep all the time.  She has been getting up and walking some.  Her appetite is improving.  She has had some swelling in her ankles and feet.  She has not been watching her diet closely and has been eating packaged and canned foods that may have higher salt content.  She denies any shortness of breath orthopnea.  She has had some chest wall discomfort particular with laying on her side.  She denies any dizziness or syncope.   Current Outpatient Medications  Medication Sig Dispense Refill  . clonazePAM (KLONOPIN) 0.5 MG tablet Take 1 tablet (0.5 mg total) by mouth 2 (two) times daily. 60 tablet 1  . escitalopram (LEXAPRO) 10 MG tablet Take 1 tablet (10 mg total) by mouth daily. (Patient taking differently: Take 10 mg by mouth in the morning.) 90 tablet 1  . furosemide (LASIX) 40 MG tablet Take 1 tablet (40 mg total) by mouth daily. 30 tablet 0  . metoprolol tartrate (LOPRESSOR) 50 MG tablet Take 1 tablet (50 mg total) by mouth 2 (two) times daily. 60 tablet 0  .  rosuvastatin (CRESTOR) 5 MG tablet Take 1 tablet (5 mg total) by mouth daily. (Patient taking differently: Take 5 mg by mouth in the morning.) 30 tablet 5              . traMADol (ULTRAM) 50 MG tablet Take 1 tablet (50 mg total) by mouth every 6 (six) hours as needed. (Patient not taking: Reported on 05/19/2021) 28 tablet 0   No current facility-administered medications for this visit.    Facility-Administered Medications Ordered in Other Visits  Medication Dose Route Frequency Provider Last Rate Last Admin  . sodium chloride flush (NS) 0.9 % injection 3 mL  3 mL Intravenous Q12H Visser, Jacquelyn D, PA-C        Physical Exam: BP (!) 163/103 (BP Location: Left Arm, Patient Position: Sitting)   Pulse 70   Resp 20   Ht 5\' 2"  (1.575 m)   Wt 138 lb (62.6 kg)   SpO2 91% Comment: RA  BMI 25.24 kg/m  She looks tired.  She is alert and oriented x3 and able to carry on a conversation easily. Cardiac exam shows regular rate and rhythm with crisp valve sounds.  There is no murmur. Lung exam reveals decreased breath sounds at the left base.  There are some crackles in both lung bases. The chest incision is healing well and the sternum is stable. The right axillary incision is healing well. There is mild edema in the ankles and feet bilaterally.  Diagnostic Tests:  Narrative & Impression  CLINICAL DATA:  Hx AVR 04/21/2021, sob  EXAM: CHEST - 2 VIEW  COMPARISON:  05/06/2021  FINDINGS: Moderately large left pleural effusion and small right pleural effusion, slightly increased. Density in the posterior left hemithorax suggesting loculated pleural fluid, more conspicuous than on previous portable study. Persistent consolidation/atelectasis in the lung bases, left greater than right.  Sternotomy wires and changes of AVR. Aortic Atherosclerosis (ICD10-170.0).  No pneumothorax.  Moderate thoracic dextroscoliosis stable.  IMPRESSION: 1. Bilateral pleural effusions, larger and probably loculated on the left. 2. Bibasilar consolidation/atelectasis, left greater than right.   Electronically Signed   By: Lucrezia Europe M.D.   On: 05/19/2021 15:42   Labs drawn on 05/17/2021 shows a white blood cell count of 11.9, hemoglobin of 9.3 with a hematocrit of 28.8.  Platelet count 501K.  Sodium 137, potassium 3.6, CO2 28, BUN 9, creatinine 0.65, glucose  105.   Impression:  She is now about 3 and half weeks following her surgery.  She did very well initially but was readmitted with what was felt to be possible sepsis although there was never a source.  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  She gradually improved with broad-spectrum antibiotics and general supportive care.  She is progressing slowly since discharge and still feels generally weak although her hemoglobin is somewhat improved and her white blood cell count is back to almost normal.  She does have bilateral pleural effusions and did have a right thoracentesis performed while she was in the hospital.  The left pleural effusion was loculated and therefore not treated.  She may have slightly more pleural effusion bilaterally on her current chest x-ray compared to her previous x-ray although it is hard to tell.  She does not report any significant shortness of breath.  She remains volume overloaded with some edema in her ankles and feet despite being on Lasix 40 mg daily.  She has normal  electrolytes and kidney function and may tolerate a higher dose.  I discussed maintaining a low-sodium diet with her but I think that is difficult due to her living situation.  She has a follow-up appointment tomorrow with cardiology.  Her most recent follow-up echocardiogram on 05/12/2021 was reviewed by me.  This shows a persistent moderate sized pericardial effusion that is mostly posterior and lateral.  There are no signs of tamponade.  She has been hemodynamically stable with a tendency towards hypertension.  I would continue to follow this for now.  She had developed atrial fibrillation while in the hospital and was converted with amiodarone but said that her amiodarone prescription ran out so she stopped taking it.  I encouraged her to continue walking as much as possible and to work with her incentive spirometer.  She is  anxious to start cardiac rehab.  She will follow-up with cardiology tomorrow.  I am going to see her back in 2 weeks with a repeat chest x-ray to reevaluate her pleural effusions.  I am hoping that if this is all post pericardiotomy syndrome that will gradually resolve with time.  Plan:  Return to see me in 2 weeks with a chest x-ray.   Gaye Pollack, MD [facility] [phone]

## 2021-05-20 ENCOUNTER — Encounter: Payer: Self-pay | Admitting: Surgery

## 2021-05-20 ENCOUNTER — Ambulatory Visit: Payer: Medicare Other | Admitting: Nurse Practitioner

## 2021-05-20 ENCOUNTER — Ambulatory Visit (INDEPENDENT_AMBULATORY_CARE_PROVIDER_SITE_OTHER): Payer: Medicare Other | Admitting: Family

## 2021-05-20 ENCOUNTER — Encounter: Payer: Self-pay | Admitting: Nurse Practitioner

## 2021-05-20 VITALS — BP 160/90 | HR 69 | Ht 64.0 in | Wt 138.0 lb

## 2021-05-20 DIAGNOSIS — F32A Depression, unspecified: Secondary | ICD-10-CM | POA: Diagnosis not present

## 2021-05-20 DIAGNOSIS — I350 Nonrheumatic aortic (valve) stenosis: Secondary | ICD-10-CM

## 2021-05-20 DIAGNOSIS — F419 Anxiety disorder, unspecified: Secondary | ICD-10-CM

## 2021-05-20 DIAGNOSIS — I3139 Other pericardial effusion (noninflammatory): Secondary | ICD-10-CM

## 2021-05-20 DIAGNOSIS — I313 Pericardial effusion (noninflammatory): Secondary | ICD-10-CM

## 2021-05-20 DIAGNOSIS — Z953 Presence of xenogenic heart valve: Secondary | ICD-10-CM | POA: Diagnosis not present

## 2021-05-20 DIAGNOSIS — I480 Paroxysmal atrial fibrillation: Secondary | ICD-10-CM

## 2021-05-20 DIAGNOSIS — I10 Essential (primary) hypertension: Secondary | ICD-10-CM

## 2021-05-20 DIAGNOSIS — Z9889 Other specified postprocedural states: Secondary | ICD-10-CM | POA: Diagnosis not present

## 2021-05-20 DIAGNOSIS — Z8679 Personal history of other diseases of the circulatory system: Secondary | ICD-10-CM

## 2021-05-20 MED ORDER — LOSARTAN POTASSIUM 25 MG PO TABS: 25.0000 mg | ORAL_TABLET | Freq: Every day | ORAL | 3 refills | Status: DC

## 2021-05-20 MED ORDER — CLONAZEPAM 0.5 MG PO TABS: 0.5000 mg | ORAL_TABLET | Freq: Three times a day (TID) | ORAL | 1 refills | Status: DC | PRN

## 2021-05-20 MED ORDER — METOPROLOL TARTRATE 50 MG PO TABS
50.0000 mg | ORAL_TABLET | Freq: Two times a day (BID) | ORAL | 3 refills | Status: DC
Start: 2021-05-20 — End: 2021-08-08

## 2021-05-20 MED ORDER — FUROSEMIDE 40 MG PO TABS: 40.0000 mg | ORAL_TABLET | Freq: Every day | ORAL | 3 refills | Status: DC

## 2021-05-20 MED ORDER — AMIODARONE HCL 200 MG PO TABS: 200.0000 mg | ORAL_TABLET | Freq: Every day | ORAL | 3 refills | Status: DC

## 2021-05-20 NOTE — Patient Instructions (Signed)
Medication Instructions:  Your physician has recommended you make the following change in your medication:   1. START Losartan 25 mg once daily  *If you need a refill on your cardiac medications before your next appointment, please call your pharmacy*   Lab Work: BMET in one week. No appointment needed. Go to [facility] Entrance at [facility] and go to 1st desk on the right to check in, past the screening table Lab hours: Monday- Friday (7:30 am- 5:30 pm)  If you have labs (blood work) drawn today and your tests are completely normal, you will receive your results only by: [name] MyChart Message (if you have MyChart) OR . A paper copy in the mail If you have any lab test that is abnormal or we need to change your treatment, we will call you to review the results.   Testing/Procedures: Your physician has requested that you have a limited echocardiogram in 3 to 4 weeks.   Echocardiography is a painless test that uses sound waves to create images of your heart. It provides your doctor with information about the size and shape of your heart and how well your heart's chambers and valves are working. This procedure takes approximately one hour. There are no restrictions for this procedure.     Follow-Up: At [facility], you and your health needs are our priority.  As part of our continuing mission to provide you with exceptional heart care, we have created designated Provider Care Teams.  These Care Teams include your primary Cardiologist (physician) and Advanced Practice Providers (APPs -  Physician Assistants and Nurse Practitioners) who all work together to provide you with the care you need, when you need it.   Your next appointment:   Follow up after testing  The format for your next appointment:   In Person  Provider:   Ida Rogue, MD or Murray Hodgkins, NP

## 2021-05-20 NOTE — Assessment & Plan Note (Addendum)
Anxiety increased around recent health. Agreed that increase of klonopin 0.5 to TID for the next month ( or two) reasonable.Continue lexapro 10mg   I looked up patient on  Controlled Substances Reporting System PMP AWARE and saw no activity that raised concern of inappropriate use.

## 2021-05-20 NOTE — Progress Notes (Signed)
Office Visit    Patient Name: Deborah Koch Date of Encounter: 05/20/2021  Primary Care Provider:  Burnard Hawthorne, FNP Primary Cardiologist:  Ida Rogue, MD  Chief Complaint    65 year old female with a history of congenital bicuspid aortic valve, ascending aortic dilation, hypertension, GERD, remote tobacco abuse, and anxiety, who presents for follow-up after recent hospitalizations status post replacement of the ascending aorta and aortic arch with aorto-innominate and aorto-left carotid bypass with supra coronary anastomosis as well as aortic valve replacement complicated by rehospitalization with pleural effusions, pericardial effusion, and paroxysmal atrial fibrillation.  Past Medical History    Past Medical History:  Diagnosis Date  . Anxiety   . Ascending Aortic Aneurysm    a. 04/2021 s/p replacement of the ascending aorta and aortic arch with aorta-innominate and aorta-left carotid bypass with supra coronary anastomosis as well as AVR with 23 mm Edwards pericardial tissue valve.  . C. difficile colitis    a. 07/2014.  [name] Congenital bicuspid aortic valve   . Depression   . Diverticulitis    hospitalized; subsequent c diff infection.   [name] GERD (gastroesophageal reflux disease)   . H/O mastitis 2015  . Heart murmur   . History of cardiac catheterization    a. 03/2021 Nl cors.  . History of tobacco abuse    a. 20 yrs, 1/4 ppd - quit.  [name] HTN (hypertension)   . Hypokalemia   . Multilevel degenerative disc disease   . PAF (paroxysmal atrial fibrillation) ([place])    a. 04/2021-->amio. No OAC 2/2 concern for hemopericardium (post-op Ao arch/AVR).  . Pleural effusion, bilateral    a. 04/2021 s/p thoracic surgery.  . Pneumonia    at age 76  . Scoliosis   . Severe aortic stenosis w/ congenital bicuspid AoV    a. 04/2021 s/p 12mm edwards pericardial tissue valve; b. 05/12/2021 Echo: EF 55-60%, no rwma, Gr2 DD, nl RV fxn, mild BAE, moderate pericardial effusion, mild MR,  mild to mod TR, Nl fxn'ing AoV prosthesis.   Past Surgical History:  Procedure Laterality Date  . AORTA -INNOMIATE BYPASS N/A 04/21/2021   Procedure: AORTA -[place] BYPASS;  Surgeon: Gaye Pollack, MD;  Location: [facility] OR;  Service: Vascular;  Laterality: N/A;  . AORTIC VALVE REPLACEMENT N/A 04/21/2021   Procedure: AORTIC VALVE REPLACEMENT (AVR) ON PUMP WITH 23MM INSPIRIS AORTIC VALVE;  Surgeon: Gaye Pollack, MD;  Location: [facility];  Service: Open Heart Surgery;  Laterality: N/A;  . BARTHOLIN GLAND CYST EXCISION    . BREAST BIOPSY Left    neg  . CAROTID-SUBCLAVIAN BYPASS GRAFT Left 04/21/2021   Procedure: AORTA LEFT COMMON CAROTID BYPASS;  Surgeon: Gaye Pollack, MD;  Location: [facility] OR;  Service: Vascular;  Laterality: Left;  . COLONOSCOPY  2013   Dr. Allen Norris  . COLONOSCOPY WITH PROPOFOL N/A 10/23/2017   Procedure: COLONOSCOPY WITH PROPOFOL;  Surgeon: Lucilla Lame, MD;  Location: [facility] ENDOSCOPY;  Service: Endoscopy;  Laterality: N/A;  . OOPHORECTOMY  2016  . OVARY SURGERY  2016   cyst on right ovary  . REPLACEMENT ASCENDING AORTA N/A 04/21/2021   Procedure: REPLACEMENT ASCENDING & ARCH AORTA WITH HEMASHIELD PLATINUM GRAFT 28 [id number];  Surgeon: Gaye Pollack, MD;  Location: [place];  Service: Open Heart Surgery;  Laterality: N/A;  CIRC ARREST  RIGHT AXILLARY ARTERY CANNULATION  . RIGHT/LEFT HEART CATH AND CORONARY ANGIOGRAPHY N/A 03/24/2021   Procedure: RIGHT/LEFT HEART CATH AND CORONARY ANGIOGRAPHY;  Surgeon: Minna Merritts, MD;  Location: [place] CV LAB;  Service: Cardiovascular;  Laterality: N/A;  . SALPINGECTOMY Bilateral   . TEE WITHOUT CARDIOVERSION N/A 04/21/2021   Procedure: TRANSESOPHAGEAL ECHOCARDIOGRAM (TEE);  Surgeon: Gaye Pollack, MD;  Location: [place];  Service: Open Heart Surgery;  Laterality: N/A;    Allergies  Allergies  Allergen Reactions  . Hydrocodone Hives and Rash  . Iodine Rash    Has tolerated amiodarone 05/06/2021    History of Present Illness     65 year old female with the above complex past medical history including congenital bicuspid aortic valve, ascending aortic dilation, hypertension, GERD, remote tobacco abuse, scoliosis, and anxiety.  Aortic valve disease has been followed over the years with annual echocardiograms and echo in March of this year showed an EF of 40-98%, grade 1 diastolic dysfunction, severe aortic stenosis, and dilated ascending aorta at 41 mm.  At clinic visit in April of this year, she reported fatigue but denied chest pain or dyspnea.  She subsequently underwent diagnostic catheterization which showed normal coronary arteries.  CTA of the chest showed a 4.7 cm fusiform ascending aortic aneurysm.  She was subsequently referred to thoracic surgery and on May 5, she underwent replacement of the ascending aorta and aortic arch with aorto-innominate and aorto-left carotid bypass with supra coronary anastomosis as well as aortic valve replacement.  Postsurgical course was relatively uneventful and she was able to be discharged home on May 11.  Unfortunately, she required readmission on May 16 for chest pain and shortness of breath that occurred after mechanical fall onto her chest.  She was admitted for possible sepsis.  She was found to have a moderate pericardial effusion and right greater than left pleural effusion.  She also developed atrial fibrillation which converted with amiodarone therapy.  She required right-sided thoracentesis.  She did have 1 of 4 bottles positive for staph epidermis and out of concern for bacteremia, she was treated with antibiotics and ultimately, this was felt to be secondary to contamination.  She was also noted to have acute kidney injury, which resolved prior to discharge.  She was subsequently discharged May 22.  Follow-up echocardiogram was performed in our office on May 26 revealing normal functioning aortic valve prosthesis with stable moderate pericardial effusion without tamponade  physiology.  Since discharge, she has had some fatigue but has noted some improvement in her strength.  She is walking in her house a few times per day, using a walker but seems to notice that her leg strength is improving.  She is not having any significant dyspnea on exertion.  She occasionally notes incisional chest wall pain.  She has been having lower extremity swelling and is taking Lasix 40 mg daily.  Though she was discharged home on amiodarone, she discontinued this saying that she ran out.  She remains on metoprolol therapy and denies any palpitations.  She has not had any PND, orthopnea, dizziness, syncope, or early satiety.  Home Medications    Prior to Admission medications   Medication Sig Start Date End Date Taking? Authorizing Provider  clonazePAM (KLONOPIN) 0.5 MG tablet Take 1 tablet (0.5 mg total) by mouth 3 (three) times daily as needed for anxiety. 05/20/21   Burnard Hawthorne, FNP  escitalopram (LEXAPRO) 10 MG tablet Take 1 tablet (10 mg total) by mouth daily. Patient taking differently: Take 10 mg by mouth in the morning. 11/22/20   Burnard Hawthorne, FNP  furosemide (LASIX) 40 MG tablet Take 1 tablet (40 mg total) by mouth daily.  05/09/21 06/08/21  Donne Hazel, MD  metoprolol tartrate (LOPRESSOR) 50 MG tablet Take 1 tablet (50 mg total) by mouth 2 (two) times daily. 05/08/21 06/07/21  Donne Hazel, MD  rosuvastatin (CRESTOR) 5 MG tablet Take 1 tablet (5 mg total) by mouth daily. Patient taking differently: Take 5 mg by mouth in the morning. 03/22/21 06/20/21  Marrianne Mood D, PA-C    Review of Systems    Still experiencing fatigue but has noted some improvement in lower extremity strength and dyspnea on exertion.  She has had some mild incisional chest discomfort but this is mostly been stable.  She denies palpitations, PND, orthopnea, dizziness, syncope, or early satiety.  All other systems reviewed and are otherwise negative except as noted above.  Physical Exam     VS:  BP (!) 160/90 (BP Location: Left Arm, Patient Position: Sitting, Cuff Size: Normal)   Pulse 69   Ht 5\' 4"  (1.626 m)   Wt 138 lb (62.6 kg)   SpO2 94%   BMI 23.69 kg/m  , BMI Body mass index is 23.69 kg/m. GEN: Well nourished, well developed, in no acute distress. HEENT: normal. Neck: Supple, no JVD, carotid bruits, or masses. Cardiac: RRR, 2/6 systolic murmur heard throughout, no rubs, or gallops. No clubbing, cyanosis, trace to 1+ bilateral ankle edema.  Radials 2+/PT 2+ and equal bilaterally.  Respiratory:  Respirations regular and unlabored, diminished breath sounds at bilateral bases, otherwise clear to auscultation. GI: Soft, nontender, nondistended, BS + x 4. MS: no deformity or atrophy. Skin: warm and dry, no rash. Neuro:  Strength and sensation are intact. Psych: Normal affect.  Accessory Clinical Findings    ECG personally reviewed by me today -regular sinus rhythm, 69, septal infarct, inferior infarct, nonspecific T changes - no acute changes.  Lab Results  Component Value Date   WBC 19.2 (H) 05/07/2021   HGB 9.0 (L) 05/07/2021   HCT 27.5 (L) 05/07/2021   MCV 93.2 05/07/2021   PLT 471 (H) 05/07/2021   Lab Results  Component Value Date   CREATININE 0.72 05/08/2021   BUN 11 05/08/2021   NA 133 (L) 05/08/2021   K 3.7 05/08/2021   CL 97 (L) 05/08/2021   CO2 29 05/08/2021   Lab Results  Component Value Date   ALT 13 04/21/2021   AST 34 04/21/2021   ALKPHOS 29 (L) 04/21/2021   BILITOT 1.4 (H) 04/21/2021   Lab Results  Component Value Date   CHOL 85 05/05/2021   HDL 93.50 02/08/2021   LDLCALC 82 02/08/2021   TRIG 57.0 02/08/2021   CHOLHDL 2 02/08/2021    Lab Results  Component Value Date   HGBA1C 5.2 04/19/2021    Assessment & Plan    1.  Bicuspid aortic valve/severe aortic stenosis: Status post bioprosthetic aortic valve replacement in early May.  Echo May 26 shows normal functioning valve.  Initial hospital course was uncomplicated however, she  required readmission in the setting of pericardial effusion and pleural effusions, further complicated by paroxysmal atrial fibrillation.  Overall, she feels as though her strength is improving but she still does have some fatigue.  She is in good spirits today.  2.  Dilated ascending aorta: Status post replacement of the ascending aorta and aortic arch with aorto-innominate and aorto-left carotid bypass with supra coronary anastomosis, along with AVR as outlined above.  3.  Paroxysmal atrial fibrillation: Patient developed A. fib during rehospitalization.  She was treated with amiodarone therapy with conversion to sinus rhythm.  Discharge instructions indicate that she should have been on 40 mg twice daily for 6 days and then reduce the dose to 200 mg daily.  It appears that after she stopped the 40 mg tablets, that she never started the 200 mg tablets.  Given ongoing issues with pericardial effusion and pleural effusions and slow recovery, her risk for recurrent A. fib remains present and I have encouraged her to fill the prescription for amiodarone 200 mg daily.  She is in sinus rhythm today.  She remains on beta-blocker therapy.  She is not on oral anticoagulation due to concerns related to recent surgery and pericardial effusion/hemopericardium.  4.  Moderate Pericardial Effusion: Noted during hospitalization and stable on outpatient follow-up echo last week.  She is asymptomatic and hemodynamically stable.  Question of Dressler syndrome.  We will plan to follow-up limited echo in another 3 to 4 weeks to assess stability.  5.  Right greater than left pleural effusion: Status post thoracentesis during recent hospitalization.  Again, question of Dressler syndrome.  Plan for follow-up chest x-ray with CT surgery in 2 weeks.  6.  Essential hypertension: Blood pressure elevated today 160/90.  She had similar elevated pressure at surgical visit yesterday.  She is currently on metoprolol 50 mg twice daily.   I am going to add losartan 25 mg daily with plan for follow-up basic metabolic panel next week.  7.  Disposition: Follow-up basic metabolic panel in 1 week.  Follow-up limited echo in approximately 3 to 4 weeks with office follow-up shortly thereafter.  Murray Hodgkins, NP 05/20/2021, 5:27 PM

## 2021-05-20 NOTE — Progress Notes (Signed)
Virtual Visit via Video Note  I connected with@  on 05/20/21 at  9:00 AM EDT by a video enabled telemedicine application and verified that I am speaking with the correct person using two identifiers.  Location patient: home Location provider:work  Persons participating in the virtual visit: patient, provider  I discussed the limitations of evaluation and management by telemedicine and the availability of in person appointments. The patient expressed understanding and agreed to proceed.   HPI: She is feeling better. Energy has improvement however she still feels fatigued.  She is walking around the house which is helpful. SOB resolved. No cough.  She has an incision from base of esophagus to above navel at mid line. Sutures removed yesterday. No pus from incision.   She continues to have soreness in between breasts but especially after she lays down for more than 2 hours. She has been taking extra strength tylenol, heat with temporary . Tramadol was not effective.   Bilateral ankles are 'little bit swollen'. No calf pain, redness, heat, fevers.   Compliant lasix 40mg  qd.   She had labs done 05/17/21 with Dr Cyndia Bent. She reports all normal ( I am unable to see via mychart, appears CBC, BMET)  Living with sister in [place] in one level house. No further falls.    Replacement of ascending aorta and aortic arch with aortic valve replacement 04/21/21  Readmitted 05/02/21 for cp, sob after mechanical fall onto her chest. She was admitted for possible sepsis. Moderate size pericardial effusion , right pleural effusion , atherosclerosis noted on CT Angio  Chest She developed atrial fib and was converted with amiodarone.   WBC elevated during hospital course.  Echocardiogram 05/12/21  revealed moderately size pericardial effusion   She had hospital follow up with Dr Cyndia Bent yesterday who suspected Dressler syndrome.  CXR yesterday showed bilateral pleural effusions. Repeat cxr in 2 weeks.     GAD- worse of late after surgery. Has been taking klonopin 0.5mg  BID with third dose prn over the last 3 weeks. She would like refill. Compliant with lexapro 10mg     ROS: See pertinent positives and negatives per HPI.    EXAM:  VITALS per patient if applicable: Ht 5\' 2"  (1.575 m)   BMI 25.24 kg/m  BP Readings from Last 3 Encounters:  05/19/21 (!) 163/103  05/08/21 132/84  04/27/21 (!) 148/85   Wt Readings from Last 3 Encounters:  05/19/21 138 lb (62.6 kg)  05/07/21 137 lb (62.1 kg)  04/27/21 138 lb 4.8 oz (62.7 kg)    GENERAL: alert, oriented, appears well and in no acute distress  HEENT: atraumatic, conjunttiva clear, no obvious abnormalities on inspection of external nose and ears  NECK: normal movements of the head and neck  LUNGS: on inspection no signs of respiratory distress, breathing rate appears normal, no obvious gross SOB, gasping or wheezing  CV: no obvious cyanosis  MS: moves all visible extremities without noticeable abnormality  PSYCH/NEURO: pleasant and cooperative, no obvious depression or anxiety, speech and thought processing grossly intact  ASSESSMENT AND PLAN:  Discussed the following assessment and plan:  Problem List Items Addressed This Visit      Cardiovascular and Mediastinum   Severe aortic valve stenosis    Symptomatically stable. Reviewed Dr Vivi Martens note. Patient fatigue improving. Discussed the role of cardiac rehab in the future.  She has repeat CXR in 2 weeks. Will follow.         Other   Anxiety and depression  Anxiety increased around recent health. Agreed that increase of klonopin 0.5 to TID for the next month ( or two) reasonable.Continue lexapro 10mg   I looked up patient on  Controlled Substances Reporting System PMP AWARE and saw no activity that raised concern of inappropriate use.        Relevant Medications   clonazePAM (KLONOPIN) 0.5 MG tablet      -we discussed possible serious and likely etiologies,  options for evaluation and workup, limitations of telemedicine visit vs in person visit, treatment, treatment risks and precautions. Pt prefers to treat via telemedicine empirically rather then risking or undertaking an in person visit at this moment.  .   I discussed the assessment and treatment plan with the patient. The patient was provided an opportunity to ask questions and all were answered. The patient agreed with the plan and demonstrated an understanding of the instructions.   The patient was advised to call back or seek an in-person evaluation if the symptoms worsen or if the condition fails to improve as anticipated.   Mable Paris, FNP

## 2021-05-20 NOTE — Assessment & Plan Note (Signed)
Symptomatically stable. Reviewed Dr Vivi Martens note. Patient fatigue improving. Discussed the role of cardiac rehab in the future.  She has repeat CXR in 2 weeks. Will follow.

## 2021-05-23 ENCOUNTER — Telehealth: Payer: Self-pay | Admitting: Cardiovascular Disease

## 2021-05-23 NOTE — Telephone Encounter (Signed)
Call attempted to discuss medication issue with pharmacy. On hold for >26 minutes. Will try again later.

## 2021-05-23 NOTE — Telephone Encounter (Signed)
Spoke with pharmacist at [facility] who states that the coverage issue began when a hospitalist increased the dose of amiodarone to 400mg  BID for 6 days which was not covered by insurance. The patient then used several 200mg  tablets to make up the difference and now her regular dose of medication will not be paid for by her insurance plan until 05/31/21. Pharmacist was able to use a coupon and give her enough amiodarone tablets to get her to her next refill covered by insurance. Called and informed patient her that she may pick up the prescription at the pharmacy in one hour for $16. She can then refill a 30 day supply on 05/31/2021. Patient expressed verbal understanding.

## 2021-05-23 NOTE — Telephone Encounter (Signed)
Pt c/o medication issue:  1. Name of Medication: amiodarone   2. How are you currently taking this medication (dosage and times per day)? 200 mg po q d  3. Are you having a reaction (difficulty breathing--STAT)?   No    4. What is your medication issue?  Patient was told to increase dose to 400 mg po q d x 6 days then continue regular 200 mg po q d dose.  [facility] told [facility] it was too early to fill possibly due to increased dose and running out too soon.    Please call sister denise to discuss and how they can get refill.

## 2021-05-27 ENCOUNTER — Encounter: Payer: Self-pay | Admitting: Emergency Medicine

## 2021-05-27 ENCOUNTER — Telehealth: Payer: Self-pay | Admitting: Cardiovascular Disease

## 2021-05-27 ENCOUNTER — Other Ambulatory Visit: Payer: Self-pay

## 2021-05-27 ENCOUNTER — Emergency Department
Admission: EM | Admit: 2021-05-27 | Discharge: 2021-05-27 | Disposition: A | Discharge status: Home / Self Care | Payer: Medicare Other | Attending: Emergency Medicine | Admitting: Emergency Medicine

## 2021-05-27 ENCOUNTER — Emergency Department: Payer: Medicare Other

## 2021-05-27 DIAGNOSIS — Z85038 Personal history of other malignant neoplasm of large intestine: Secondary | ICD-10-CM | POA: Insufficient documentation

## 2021-05-27 DIAGNOSIS — R079 Chest pain, unspecified: Secondary | ICD-10-CM | POA: Diagnosis not present

## 2021-05-27 DIAGNOSIS — Z79899 Other long term (current) drug therapy: Secondary | ICD-10-CM | POA: Diagnosis not present

## 2021-05-27 DIAGNOSIS — I10 Essential (primary) hypertension: Secondary | ICD-10-CM | POA: Diagnosis not present

## 2021-05-27 DIAGNOSIS — F17210 Nicotine dependence, cigarettes, uncomplicated: Secondary | ICD-10-CM | POA: Insufficient documentation

## 2021-05-27 DIAGNOSIS — I517 Cardiomegaly: Secondary | ICD-10-CM | POA: Diagnosis not present

## 2021-05-27 DIAGNOSIS — R0902 Hypoxemia: Secondary | ICD-10-CM | POA: Diagnosis not present

## 2021-05-27 DIAGNOSIS — R0789 Other chest pain: Secondary | ICD-10-CM | POA: Diagnosis not present

## 2021-05-27 DIAGNOSIS — J90 Pleural effusion, not elsewhere classified: Secondary | ICD-10-CM | POA: Diagnosis not present

## 2021-05-27 DIAGNOSIS — R6889 Other general symptoms and signs: Secondary | ICD-10-CM | POA: Diagnosis not present

## 2021-05-27 DIAGNOSIS — Z743 Need for continuous supervision: Secondary | ICD-10-CM | POA: Diagnosis not present

## 2021-05-27 LAB — CBC
HCT: 30 % — ABNORMAL LOW (ref 36.0–46.0)
Hemoglobin: 9.6 g/dL — ABNORMAL LOW (ref 12.0–15.0)
MCH: 28.6 pg (ref 26.0–34.0)
MCHC: 32 g/dL (ref 30.0–36.0)
MCV: 89.3 fL (ref 80.0–100.0)
Platelets: 382 10*3/uL (ref 150–400)
RBC: 3.36 MIL/uL — ABNORMAL LOW (ref 3.87–5.11)
RDW: 16.2 % — ABNORMAL HIGH (ref 11.5–15.5)
WBC: 9.5 10*3/uL (ref 4.0–10.5)
nRBC: 0 % (ref 0.0–0.2)

## 2021-05-27 LAB — TROPONIN I (HIGH SENSITIVITY)
Troponin I (High Sensitivity): 10 ng/L (ref <18)
Troponin I (High Sensitivity): 9 ng/L (ref <18)

## 2021-05-27 LAB — BASIC METABOLIC PANEL
Anion gap: 10 (ref 5–15)
BUN: 6 mg/dL — ABNORMAL LOW (ref 8–23)
CO2: 28 mmol/L (ref 22–32)
Calcium: 8.7 mg/dL — ABNORMAL LOW (ref 8.9–10.3)
Chloride: 95 mmol/L — ABNORMAL LOW (ref 98–111)
Creatinine, Ser: 0.78 mg/dL (ref 0.44–1.00)
GFR, Estimated: >60 mL/min (ref >60)
Glucose, Bld: 132 mg/dL — ABNORMAL HIGH (ref 70–99)
Potassium: 2.9 mmol/L — ABNORMAL LOW (ref 3.5–5.1)
Sodium: 133 mmol/L — ABNORMAL LOW (ref 135–145)

## 2021-05-27 MED ORDER — POTASSIUM CHLORIDE CRYS ER 10 MEQ PO TBCR: 10.0000 meq | EXTENDED_RELEASE_TABLET | Freq: Two times a day (BID) | ORAL | 0 refills | Status: DC

## 2021-05-27 NOTE — Telephone Encounter (Signed)
Called ED and spoke with Aldona Bar RN to make her aware patient is coming in by private vehicle because of chest pain and reviewed recent surgeries. She advised they would watch for her to come in for evaluation.

## 2021-05-27 NOTE — ED Notes (Signed)
See triage note  Presents with some SOB and chest pain this am  States chest pain woke her up and then she felt like she could not get her breath  Was given NTG spray per EMS  Denies any pain at present   resp even and non labored

## 2021-05-27 NOTE — Telephone Encounter (Signed)
Pt c/o of Chest Pain: STAT if CP now or developed within 24 hours  1. Are you having CP right now? YES  2. Are you experiencing any other symptoms (ex. SOB, nausea, vomiting, sweating)? SOB NAUSEA   3. How long have you been experiencing CP? EARLY THIS MORNING   4. Is your CP continuous or coming and going? CONSTANT SINCE THIS MORNING IMPROVED SINCE EPISODE BUT STILL SORENESS AT STERNAL SURGICAL SITE   5. Have you taken Nitroglycerin? NO    Patient wants to be worked in for eval  ?

## 2021-05-27 NOTE — ED Triage Notes (Signed)
Pt comes into the ED via [facility] from home c/o chest pain with radiation to the back.  Pt had a AAA repair and mitral valve repair 5 weeks ago and she has since then had some discomfort.  Substernal pain is main complaint.  190/80, last being 140/82.  2 nitro SL spray, 324 ASA.  HR 60's 95% RA.  20g L AC.

## 2021-05-27 NOTE — Discharge Instructions (Addendum)
Please follow-up with your cardiologist for recheck/reevaluation.  Your emergency department work-up today is overall reassuring.  Please take your prescribed potassium supplements as written.  Return to the emergency department for any return of/worsening chest pain, trouble breathing or any other symptom personally concerning to yourself.

## 2021-05-27 NOTE — Telephone Encounter (Signed)
Spoke with patient and she reports that she was glad that she went. She states it was discomfort from her surgery. She reports the provider who saw her there was very nice and explained everything to her and helped her understand. She did ask about her home health services and she has not seen anyone yet. Reviewed that her primary care office would be the one to call for follow up on that but that I would send a note over to them. She was appreciative for the call back with no further questions at this time.

## 2021-05-27 NOTE — ED Provider Notes (Signed)
[facility] Emergency Department Provider Note  Time seen: 11:46 AM  I have reviewed the triage vital signs and the nursing notes.   HISTORY  Chief Complaint Chest Pain   HPI Deborah Koch is a 65 y.o. female with a past medical history of anxiety, depression, gastric reflux, hypertension, recent valve replacement, bypass surgery 1 month ago, presents to the emergency department for chest pain.  According to the patient since her surgery she states she has been experiencing intermittent pain in the chest that comes and goes often times overnight.  Patient states she has bad scoliosis and has difficulty finding a position that is comfortable to sleep in.  Patient states this morning she awoke to chest pain which is not atypical for her but the chest pain did not seem to be going away as quickly as it usually does which concerned the patient so she came to the emergency department.  Since arriving patient states her pain has diminished to her baseline level.  Denies any cough or fever.  No leg pain or swelling.   Past Medical History:  Diagnosis Date   Anxiety    Ascending Aortic Aneurysm    a. 04/2021 s/p replacement of the ascending aorta and aortic arch with aorta-innominate and aorta-left carotid bypass with supra coronary anastomosis as well as AVR with 23 mm Edwards pericardial tissue valve.   C. difficile colitis    a. 07/2014.   Congenital bicuspid aortic valve    Depression    Diverticulitis    hospitalized; subsequent c diff infection.    GERD (gastroesophageal reflux disease)    H/O mastitis 2015   Heart murmur    History of cardiac catheterization    a. 03/2021 Nl cors.   History of tobacco abuse    a. 20 yrs, 1/4 ppd - quit.   HTN (hypertension)    Hypokalemia    Multilevel degenerative disc disease    PAF (paroxysmal atrial fibrillation) ([place])    a. 04/2021-->amio. No OAC 2/2 concern for hemopericardium (post-op Ao arch/AVR).   Pleural  effusion, bilateral    a. 04/2021 s/p thoracic surgery.   Pneumonia    at age 56   Scoliosis    Severe aortic stenosis w/ congenital bicuspid AoV    a. 04/2021 s/p 35mm edwards pericardial tissue valve; b. 05/12/2021 Echo: EF 55-60%, no rwma, Gr2 DD, nl RV fxn, mild BAE, moderate pericardial effusion, mild MR, mild to mod TR, Nl fxn'ing AoV prosthesis.    Patient Active Problem List   Diagnosis Date Noted   Sepsis due to undetermined organism ([place]) 05/02/2021   Hypertensive emergency 05/02/2021   S/P ascending aortic aneurysm repair 04/21/2021   HLD (hyperlipidemia) 09/15/2020   Fatigue 01/28/2020   Atherosclerosis of aorta ([place]) 06/27/2019   Bronchitis 01/08/2019   Personal history of colonic polyps    Benign neoplasm of ascending colon    LLQ abdominal pain 12/26/2016   Routine physical examination 10/09/2016   History of Clostridium difficile infection 12/21/2015   HTN (hypertension) 06/08/2015   Severe aortic valve stenosis    Congenital bicuspid aortic valve    Ascending aorta dilation ([facility])    Anxiety and depression 12/19/2013   GERD (gastroesophageal reflux disease) 12/19/2013    Past Surgical History:  Procedure Laterality Date   AORTA -INNOMIATE BYPASS N/A 04/21/2021   Procedure: AORTA -[place] BYPASS;  Surgeon: Gaye Pollack, MD;  Location: [facility] OR;  Service: Vascular;  Laterality: N/A;   AORTIC VALVE REPLACEMENT  N/A 04/21/2021   Procedure: AORTIC VALVE REPLACEMENT (AVR) ON PUMP WITH 23MM INSPIRIS AORTIC VALVE;  Surgeon: Gaye Pollack, MD;  Location: [facility];  Service: Open Heart Surgery;  Laterality: N/A;   BARTHOLIN GLAND CYST EXCISION     BREAST BIOPSY Left    neg   CAROTID-SUBCLAVIAN BYPASS GRAFT Left 04/21/2021   Procedure: AORTA LEFT COMMON CAROTID BYPASS;  Surgeon: Gaye Pollack, MD;  Location: [facility];  Service: Vascular;  Laterality: Left;   COLONOSCOPY  2013   Dr. Allen Norris   COLONOSCOPY WITH PROPOFOL N/A 10/23/2017   Procedure: COLONOSCOPY WITH PROPOFOL;  Surgeon:  Lucilla Lame, MD;  Location: [facility] ENDOSCOPY;  Service: Endoscopy;  Laterality: N/A;   OOPHORECTOMY  2016   OVARY SURGERY  2016   cyst on right ovary   REPLACEMENT ASCENDING AORTA N/A 04/21/2021   Procedure: REPLACEMENT ASCENDING & ARCH AORTA WITH HEMASHIELD PLATINUM GRAFT 28 [id number];  Surgeon: Gaye Pollack, MD;  Location: [place];  Service: Open Heart Surgery;  Laterality: N/A;  CIRC ARREST  RIGHT AXILLARY ARTERY CANNULATION   RIGHT/LEFT HEART CATH AND CORONARY ANGIOGRAPHY N/A 03/24/2021   Procedure: RIGHT/LEFT HEART CATH AND CORONARY ANGIOGRAPHY;  Surgeon: Minna Merritts, MD;  Location: [facility];  Service: Cardiovascular;  Laterality: N/A;   SALPINGECTOMY Bilateral    TEE WITHOUT CARDIOVERSION N/A 04/21/2021   Procedure: TRANSESOPHAGEAL ECHOCARDIOGRAM (TEE);  Surgeon: Gaye Pollack, MD;  Location: [place];  Service: Open Heart Surgery;  Laterality: N/A;    Prior to Admission medications   Medication Sig Start Date End Date Taking? Authorizing Provider  amiodarone (PACERONE) 200 MG tablet Take 1 tablet (200 mg total) by mouth daily. 05/20/21 06/19/21  Theora Gianotti, NP  clonazePAM (KLONOPIN) 0.5 MG tablet Take 1 tablet (0.5 mg total) by mouth 3 (three) times daily as needed for anxiety. 05/20/21   Burnard Hawthorne, FNP  escitalopram (LEXAPRO) 10 MG tablet Take 1 tablet (10 mg total) by mouth daily. Patient taking differently: Take 10 mg by mouth in the morning. 11/22/20   Burnard Hawthorne, FNP  furosemide (LASIX) 40 MG tablet Take 1 tablet (40 mg total) by mouth daily. 05/20/21 06/19/21  Theora Gianotti, NP  losartan (COZAAR) 25 MG tablet Take 1 tablet (25 mg total) by mouth daily. 05/20/21 08/18/21  Theora Gianotti, NP  metoprolol tartrate (LOPRESSOR) 50 MG tablet Take 1 tablet (50 mg total) by mouth 2 (two) times daily. 05/20/21 06/19/21  Theora Gianotti, NP  rosuvastatin (CRESTOR) 5 MG tablet Take 1 tablet (5 mg total) by mouth daily. Patient  taking differently: Take 5 mg by mouth in the morning. 03/22/21 06/20/21  Marrianne Mood D, PA-C    Allergies  Allergen Reactions   Hydrocodone Hives and Rash   Iodine Rash    Has tolerated amiodarone 05/06/2021    Family History  Problem Relation Age of Onset   Hypertension Mother    Colon cancer Mother    Anxiety disorder Mother    Depression Mother    Heart failure Father    Hypertension Father    Depression Sister    Anxiety disorder Sister    Alcohol abuse Brother    Schizophrenia Brother    Breast cancer Maternal Aunt    Heart attack Neg Hx    Stroke Neg Hx     Social History Social History   Tobacco Use   Smoking status: Light Smoker    Packs/day: 0.25    Years: 20.00  Pack years: 5.00    Types: Cigarettes   Smokeless tobacco: Never  Vaping Use   Vaping Use: Never used  Substance Use Topics   Alcohol use: Yes    Alcohol/week: 12.0 standard drinks    Types: 10 Glasses of wine, 2 Cans of beer per week    Comment:  2-3 glasses of wine every other evening   Drug use: No    Review of Systems Constitutional: Negative for fever. Cardiovascular: Central chest pain Respiratory: Negative for shortness of breath. Gastrointestinal: Negative for abdominal pain, vomiting Musculoskeletal: Negative for musculoskeletal complaints Neurological: Negative for headache All other ROS negative  ____________________________________________   PHYSICAL EXAM:  VITAL SIGNS: ED Triage Vitals  Enc Vitals Group     BP 05/27/21 0902 132/79     Pulse Rate 05/27/21 0902 60     Resp 05/27/21 0902 18     Temp 05/27/21 0902 98.5 F (36.9 C)     Temp Source 05/27/21 0902 Oral     SpO2 05/27/21 0902 94 %     Weight 05/27/21 0901 138 lb (62.6 kg)     Height 05/27/21 0901 5\' 4"  (1.626 m)     Head Circumference --      Peak Flow --      Pain Score 05/27/21 0901 4     Pain Loc --      Pain Edu? --      Excl. in [place]? --     Constitutional: Alert and oriented. Well appearing  and in no distress. Eyes: Normal exam ENT      Head: Normocephalic and atraumatic.      Mouth/Throat: Mucous membranes are moist. Cardiovascular: Normal rate, regular rhythm.  Well-appearing sternotomy incision.  Moderate tenderness to palpation over the sternum. Respiratory: Normal respiratory effort without tachypnea nor retractions. Breath sounds are clear  Gastrointestinal: Soft and nontender. No distention.  Musculoskeletal: Nontender with normal range of motion in all extremities. No lower extremity tenderness or edema. Neurologic:  Normal speech and language. No gross focal neurologic deficits  Skin:  Skin is warm, dry and intact.  Psychiatric: Mood and affect are normal.   ____________________________________________    EKG  EKG viewed and interpreted by myself shows a sinus bradycardia 59 bpm with a narrow QRS, normal axis, largely normal intervals and nonspecific ST changes.  ____________________________________________    RADIOLOGY  Chest x-ray shows no acute abnormality.  ____________________________________________   INITIAL IMPRESSION / ASSESSMENT AND PLAN / ED COURSE  Pertinent labs & imaging results that were available during my care of the patient were reviewed by me and considered in my medical decision making (see chart for details).   Patient presents emergency department for intermittent chest pain since her surgery 1 month ago.  Patient had a major operation, sternotomy I reviewed the x-ray images showing sternotomy wires without acute abnormality.  Reassuringly patient's labs are largely within normal limits.  Hemoglobin is uptrending, troponin has come down to normal levels.  Vitals are reassuring.  Patient has moderate sternal tenderness to palpation.  Highly suspect chest wall discomfort as the cause of the patient's pain.  However as a precaution we will repeat a troponin.  If the patient's repeat troponin remains negative I believe the patient would be  safe for discharge home and follow-up with her cardiologist.  Patient agreeable to this plan of care.  Repeat troponin remains negative.  Patient continues to appear well.  We will discharge patient home with cardiology follow-up.  We will  place patient on potassium supplements for the next 1 week.  Patient agreeable to plan of care.  Danyle Boening was evaluated in Emergency Department on 05/27/2021 for the symptoms described in the history of present illness. She was evaluated in the context of the global COVID-19 pandemic, which necessitated consideration that the patient might be at risk for infection with the SARS-CoV-2 virus that causes COVID-19. Institutional protocols and algorithms that pertain to the evaluation of patients at risk for COVID-19 are in a state of rapid change based on information released by regulatory bodies including the CDC and federal and state organizations. These policies and algorithms were followed during the patient's care in the ED.  ____________________________________________   FINAL CLINICAL IMPRESSION(S) / ED DIAGNOSES  Chest pain   Harvest Dark, MD 05/27/21 1234

## 2021-05-27 NOTE — Telephone Encounter (Signed)
Patient called to notify that she went to ED today & was discharged same day.

## 2021-05-27 NOTE — Telephone Encounter (Signed)
Spoke with patient and she woke up this morning and was having chest pain. She describes as mid sternal pain with nausea and shortness of breath. She wanted to come in and see Dr. Rockey Situ or [name] NP. Reviewed that given her recent surgeries it would be better for her to proceed to ED for evaluation and assessment. Will call ED Charge nurse to make her aware patient will be coming in for evaluation. Patient verbalized understanding of conversation, agreeable with plan, and had no further questions at this time.

## 2021-05-30 ENCOUNTER — Telehealth: Payer: Self-pay

## 2021-05-30 NOTE — Telephone Encounter (Signed)
Pt called and states that someone was supposed to reach out to her about home health following heart surgery. Please advise

## 2021-05-30 NOTE — Telephone Encounter (Signed)
Dr Cyndia Bent,  Message sent before I wrote to you. Sorry about that!  Patient calling our office about home health which I am happy to arrange. Not sure of status of cardiac rehab. I see your referral is there. Just wanted you to be aware.   Joycelyn Schmid, NP

## 2021-05-30 NOTE — Telephone Encounter (Signed)
Call pt   Dr Cyndia Bent her cardiac surgeon referred her to cardiac rehab which we discussed in our visit. If she is referring to cardiac rehab, please ask pt to call Dr bartle's office as I can see referral was placed 04/27/21 so not sure what the delay is here.    She would like home health referral per her call? What services, nursing or PT is she looking for   I am happy to place

## 2021-05-30 NOTE — Telephone Encounter (Signed)
noted 

## 2021-05-30 NOTE — Telephone Encounter (Signed)
Per DC summary from 05/08/21 Home Health placed but then looks like it was caneled un known reason because discharge planning states Home health and PT needed.

## 2021-05-31 ENCOUNTER — Other Ambulatory Visit: Payer: Self-pay | Admitting: Family

## 2021-05-31 DIAGNOSIS — Z8679 Personal history of other diseases of the circulatory system: Secondary | ICD-10-CM

## 2021-05-31 NOTE — Telephone Encounter (Signed)
From what I see in DC summary and clinical notes PT and OT were recommended. Patient is asking for assistance with ADLs.

## 2021-05-31 NOTE — Progress Notes (Unsigned)
Call pt Referral placed pt/ ot Let us know if you dont hear back within a week in regards to an appointment being scheduled.

## 2021-05-31 NOTE — Progress Notes (Signed)
LM that referral has been placed & to please let us know if she does not hear from someone to get set up for PT/OT.

## 2021-06-02 ENCOUNTER — Ambulatory Visit (INDEPENDENT_AMBULATORY_CARE_PROVIDER_SITE_OTHER): Payer: Self-pay | Admitting: Surgery

## 2021-06-02 ENCOUNTER — Encounter: Payer: Self-pay | Admitting: Surgery

## 2021-06-02 ENCOUNTER — Other Ambulatory Visit: Payer: Self-pay

## 2021-06-02 VITALS — BP 128/76 | HR 72 | Resp 20 | Ht 62.0 in | Wt 138.0 lb

## 2021-06-02 DIAGNOSIS — Z9889 Other specified postprocedural states: Secondary | ICD-10-CM

## 2021-06-02 DIAGNOSIS — J90 Pleural effusion, not elsewhere classified: Secondary | ICD-10-CM

## 2021-06-02 DIAGNOSIS — Z8679 Personal history of other diseases of the circulatory system: Secondary | ICD-10-CM

## 2021-06-02 LAB — FUNGUS CULTURE RESULT

## 2021-06-02 LAB — FUNGAL ORGANISM REFLEX

## 2021-06-02 LAB — FUNGUS CULTURE WITH STAIN

## 2021-06-02 NOTE — Progress Notes (Signed)
HPI:  Patient returns for routine postoperative follow-up having undergone replacement of the ascending aorta and aortic arch with aorto-innominate and aorto-left carotid bypass with a supra coronary anastomosis as well as aortic valve replacement using a 23 mm Edwards pericardial valve on 04/21/2021.   The patient's early postoperative recovery while in the hospital was notable for an uncomplicated postoperative course.  She was discharged on 04/27/2021.   She was readmitted on 05/02/2021 after presenting with complaints of chest pain, shortness of breath, nausea, and dizziness.  She had a mechanical fall onto her chest.  In the emergency room she was found to be hypertensive.  White blood cell count was elevated to 32.9.  Hematocrit was 26.7 and was stable from discharge.  Lactic acid and procalcitonin were both elevated.  CXR showed small but stable pleural effusions with bibasilar consolidation/atelectasis.  A noncontrast CT scan of the chest showed a moderate sized pericardial effusion with density suggestive of hemopericardium.  Small bilateral pleural effusions were also confirmed on the CT scan.  A follow-on CT angiogram was obtained and showed no evidence of aortic dissection.  The report mentioned a 10 x 9 mm outpouching of contrast arising from the transverse arch inferiorly which is a ligated sidebranch of the Hemashield graft.    There is also some density around the spleen that looks like a subcapsular hematoma.  An echocardiogram showed a moderate pericardial effusion posteriorly with no signs of tamponade.  The aortic valve prosthesis was functioning normally with a mean gradient of 12 mmHg.  There is no aortic insufficiency.  Left and right ventricular systolic function were normal.  She was admitted with a diagnosis of possible sepsis.  1 out of 4 blood cultures grew out staph epi that was likely contaminant.  There are no other positive cultures.  She was maintained on broad-spectrum  antibiotics.  She did develop some ICU delirium which gradually improved.  She developed atrial fibrillation and was converted with amiodarone.  She was not started on anticoagulants due to a question of hemopericardium.  Her white blood cell count gradually decreased to about 19,000 and remained stable.  Her mental status improved and she was up walking around and was discharged home on 05/08/2021.   Since I last saw her on 05/19/2021 she said that she has continued to feel better.  She is walking daily without shortness of breath.  She has some mild chest wall soreness from her incision.  The swelling in her ankles and feet has resolved.  Her only complaint is that she has persistent nausea which she believes is related to the amiodarone.  This has prevented her from eating much.  She has not noticed any tachycardia or palpitations.  She is here today with her sister.  Current Outpatient Medications  Medication Sig Dispense Refill   clonazePAM (KLONOPIN) 0.5 MG tablet Take 1 tablet (0.5 mg total) by mouth 3 (three) times daily as needed for anxiety. 90 tablet 1   escitalopram (LEXAPRO) 10 MG tablet Take 1 tablet (10 mg total) by mouth daily. (Patient taking differently: Take 10 mg by mouth in the morning.) 90 tablet 1   furosemide (LASIX) 40 MG tablet Take 1 tablet (40 mg total) by mouth daily. 90 tablet 3   losartan (COZAAR) 25 MG tablet Take 1 tablet (25 mg total) by mouth daily. 90 tablet 3   metoprolol tartrate (LOPRESSOR) 50 MG tablet Take 1 tablet (50 mg total) by mouth 2 (two) times daily. 180 tablet  3   potassium chloride (KLOR-CON) 10 MEQ tablet Take 1 tablet (10 mEq total) by mouth 2 (two) times daily. 14 tablet 0   rosuvastatin (CRESTOR) 5 MG tablet Take 1 tablet (5 mg total) by mouth daily. (Patient taking differently: Take 5 mg by mouth in the morning.) 30 tablet 5   amiodarone (PACERONE) 200 MG tablet Take 1 tablet (200 mg total) by mouth daily. 30 tablet 3   No current  facility-administered medications for this visit.   Facility-Administered Medications Ordered in Other Visits  Medication Dose Route Frequency Provider Last Rate Last Admin   sodium chloride flush (NS) 0.9 % injection 3 mL  3 mL Intravenous Q12H Mickle Plumb, Jacquelyn D, PA-C         Physical Exam: BP 128/76   Pulse 72   Resp 20   Ht 5\' 2"  (1.575 m)   Wt 138 lb (62.6 kg)   SpO2 94%   BMI 25.24 kg/m  She looks well. Cardiac exam shows a regular rate and rhythm with normal heart sounds.  There is no murmur. Lung exam reveals decreased breath sounds at the bases. The chest incision is healing well and the sternum is stable. There is no peripheral edema.  Diagnostic Tests:  Her most recent chest x-ray on 05/27/2021 was reviewed by me.  There is bibasilar atelectasis and small pleural effusions but I think overall the aeration in her lower lobe is improving.   Impression:  She is doing well 6 weeks following her surgery.  I suspect that her nausea is related to the amiodarone and she is far enough out that we can stop it at this time. I encouraged her to continue ambulating and use her IS. Her labs from 05/27/21 show normalization of WBC ct to 9.5, Hgb up to 9.6 and normal renal function. She is scheduled for a repeat echo in July.   Plan:  I will see her back in one month with a repeat CXR.   Gaye Pollack, MD [facility] [phone]

## 2021-06-04 ENCOUNTER — Encounter: Payer: Self-pay | Admitting: Family

## 2021-06-06 ENCOUNTER — Other Ambulatory Visit: Payer: Medicare Other

## 2021-06-07 DIAGNOSIS — I10 Essential (primary) hypertension: Secondary | ICD-10-CM | POA: Diagnosis not present

## 2021-06-07 DIAGNOSIS — Z950 Presence of cardiac pacemaker: Secondary | ICD-10-CM | POA: Diagnosis not present

## 2021-06-07 DIAGNOSIS — J9811 Atelectasis: Secondary | ICD-10-CM | POA: Diagnosis not present

## 2021-06-07 DIAGNOSIS — Z9181 History of falling: Secondary | ICD-10-CM | POA: Diagnosis not present

## 2021-06-07 DIAGNOSIS — I4891 Unspecified atrial fibrillation: Secondary | ICD-10-CM | POA: Diagnosis not present

## 2021-06-07 DIAGNOSIS — Z7982 Long term (current) use of aspirin: Secondary | ICD-10-CM | POA: Diagnosis not present

## 2021-06-07 DIAGNOSIS — Z48812 Encounter for surgical aftercare following surgery on the circulatory system: Secondary | ICD-10-CM | POA: Diagnosis not present

## 2021-06-07 DIAGNOSIS — I2699 Other pulmonary embolism without acute cor pulmonale: Secondary | ICD-10-CM | POA: Diagnosis not present

## 2021-06-07 DIAGNOSIS — F32A Depression, unspecified: Secondary | ICD-10-CM | POA: Diagnosis not present

## 2021-06-14 DIAGNOSIS — Z7982 Long term (current) use of aspirin: Secondary | ICD-10-CM | POA: Diagnosis not present

## 2021-06-14 DIAGNOSIS — Z48812 Encounter for surgical aftercare following surgery on the circulatory system: Secondary | ICD-10-CM | POA: Diagnosis not present

## 2021-06-14 DIAGNOSIS — I2699 Other pulmonary embolism without acute cor pulmonale: Secondary | ICD-10-CM | POA: Diagnosis not present

## 2021-06-14 DIAGNOSIS — I4891 Unspecified atrial fibrillation: Secondary | ICD-10-CM | POA: Diagnosis not present

## 2021-06-14 DIAGNOSIS — F32A Depression, unspecified: Secondary | ICD-10-CM | POA: Diagnosis not present

## 2021-06-14 DIAGNOSIS — Z9181 History of falling: Secondary | ICD-10-CM | POA: Diagnosis not present

## 2021-06-14 DIAGNOSIS — I10 Essential (primary) hypertension: Secondary | ICD-10-CM | POA: Diagnosis not present

## 2021-06-14 DIAGNOSIS — J9811 Atelectasis: Secondary | ICD-10-CM | POA: Diagnosis not present

## 2021-06-14 DIAGNOSIS — Z950 Presence of cardiac pacemaker: Secondary | ICD-10-CM | POA: Diagnosis not present

## 2021-06-15 ENCOUNTER — Telehealth: Payer: Self-pay | Admitting: Family

## 2021-06-15 NOTE — Telephone Encounter (Signed)
I wanted to make sure that I was giving the right info. I do not see diagnosis of pleural effusion in chart? Please advise?

## 2021-06-15 NOTE — Telephone Encounter (Signed)
Deborah Koch from [facility] called to see if the PT is being treated for a plural effusion. She would like to be called at either [phone] or if a VM is needed to be left then [phone].

## 2021-06-15 NOTE — Telephone Encounter (Addendum)
I called and spoke with Ms. Deborah Koch to advised on diagnosis in chart. She stated that [facility] coders were giving her a hard time based on her documentation & she wanted to clarify patient's diagnoses & history. She will call back if needed.

## 2021-06-15 NOTE — Telephone Encounter (Signed)
Never mind I do see this. Okay to advise yes?

## 2021-06-15 NOTE — Telephone Encounter (Signed)
She does have pleural effusion however PT is more for deconditioning after complicated hospitalization and aortic valve replacement/repair.

## 2021-06-16 ENCOUNTER — Telehealth: Payer: Self-pay

## 2021-06-16 NOTE — Telephone Encounter (Signed)
Deborah Koch, her physical therapist from [facility] called to state that patient declined appointment today. She was seen Tuesday & he stated that she was doing very well. She wanted to work on things on her on the rest of the week & that he would see her again next Tuesday.

## 2021-06-17 ENCOUNTER — Telehealth: Payer: Self-pay | Admitting: Family

## 2021-06-17 MED ORDER — ONDANSETRON 4 MG PO TBDP: 4.0000 mg | ORAL_TABLET | Freq: Three times a day (TID) | ORAL | 0 refills | Status: DC | PRN

## 2021-06-17 NOTE — Telephone Encounter (Signed)
PT called to advise that since last night she has been feeling nauseating but has not thrown up anything. She states it makes it hard for her to eat and was wondering if Ar[facility] could call her in anything.

## 2021-06-17 NOTE — Telephone Encounter (Signed)
Pt informed & verbalized understanding that if symptoms continued she would have to go to ED or UC.

## 2021-06-17 NOTE — Addendum Note (Signed)
Addended by: Burnard Hawthorne on: 06/17/2021 04:59 PM   Modules accepted: Orders

## 2021-06-17 NOTE — Telephone Encounter (Signed)
Call pt I sent in zofran however agree that nausea is very unusual without knowing cause.   If zofran persists, she develops fever or other symptoms, she will need to go to UC.

## 2021-06-17 NOTE — Telephone Encounter (Signed)
I called patient & she stated that she has been nauseated all day. It started last night & she is very tired with no energy. Stated that she has felt this way before, but no other symptoms. NO fever, SOB, nothing that would be Covid related. She asked for something for nausea. I advised with her recent hx she needed to most likely be seen.

## 2021-06-21 ENCOUNTER — Other Ambulatory Visit: Payer: Self-pay | Admitting: Family

## 2021-06-21 DIAGNOSIS — F32A Depression, unspecified: Secondary | ICD-10-CM | POA: Diagnosis not present

## 2021-06-21 DIAGNOSIS — I10 Essential (primary) hypertension: Secondary | ICD-10-CM | POA: Diagnosis not present

## 2021-06-21 DIAGNOSIS — Z48812 Encounter for surgical aftercare following surgery on the circulatory system: Secondary | ICD-10-CM | POA: Diagnosis not present

## 2021-06-21 DIAGNOSIS — I2699 Other pulmonary embolism without acute cor pulmonale: Secondary | ICD-10-CM | POA: Diagnosis not present

## 2021-06-21 DIAGNOSIS — I4891 Unspecified atrial fibrillation: Secondary | ICD-10-CM | POA: Diagnosis not present

## 2021-06-21 DIAGNOSIS — J9811 Atelectasis: Secondary | ICD-10-CM | POA: Diagnosis not present

## 2021-06-21 DIAGNOSIS — Z950 Presence of cardiac pacemaker: Secondary | ICD-10-CM | POA: Diagnosis not present

## 2021-06-21 DIAGNOSIS — Z9181 History of falling: Secondary | ICD-10-CM | POA: Diagnosis not present

## 2021-06-21 DIAGNOSIS — Z7982 Long term (current) use of aspirin: Secondary | ICD-10-CM | POA: Diagnosis not present

## 2021-06-25 DIAGNOSIS — Z9181 History of falling: Secondary | ICD-10-CM | POA: Diagnosis not present

## 2021-06-25 DIAGNOSIS — Z950 Presence of cardiac pacemaker: Secondary | ICD-10-CM | POA: Diagnosis not present

## 2021-06-25 DIAGNOSIS — J9811 Atelectasis: Secondary | ICD-10-CM | POA: Diagnosis not present

## 2021-06-25 DIAGNOSIS — F32A Depression, unspecified: Secondary | ICD-10-CM | POA: Diagnosis not present

## 2021-06-25 DIAGNOSIS — I2699 Other pulmonary embolism without acute cor pulmonale: Secondary | ICD-10-CM | POA: Diagnosis not present

## 2021-06-25 DIAGNOSIS — Z7982 Long term (current) use of aspirin: Secondary | ICD-10-CM | POA: Diagnosis not present

## 2021-06-25 DIAGNOSIS — I10 Essential (primary) hypertension: Secondary | ICD-10-CM | POA: Diagnosis not present

## 2021-06-25 DIAGNOSIS — I4891 Unspecified atrial fibrillation: Secondary | ICD-10-CM | POA: Diagnosis not present

## 2021-06-25 DIAGNOSIS — Z48812 Encounter for surgical aftercare following surgery on the circulatory system: Secondary | ICD-10-CM | POA: Diagnosis not present

## 2021-06-28 DIAGNOSIS — I4891 Unspecified atrial fibrillation: Secondary | ICD-10-CM | POA: Diagnosis not present

## 2021-06-28 DIAGNOSIS — Z48812 Encounter for surgical aftercare following surgery on the circulatory system: Secondary | ICD-10-CM | POA: Diagnosis not present

## 2021-06-28 DIAGNOSIS — I2699 Other pulmonary embolism without acute cor pulmonale: Secondary | ICD-10-CM | POA: Diagnosis not present

## 2021-06-28 DIAGNOSIS — I10 Essential (primary) hypertension: Secondary | ICD-10-CM | POA: Diagnosis not present

## 2021-06-28 DIAGNOSIS — Z950 Presence of cardiac pacemaker: Secondary | ICD-10-CM | POA: Diagnosis not present

## 2021-06-28 DIAGNOSIS — J9811 Atelectasis: Secondary | ICD-10-CM | POA: Diagnosis not present

## 2021-06-28 DIAGNOSIS — F32A Depression, unspecified: Secondary | ICD-10-CM | POA: Diagnosis not present

## 2021-06-28 DIAGNOSIS — Z9181 History of falling: Secondary | ICD-10-CM | POA: Diagnosis not present

## 2021-06-28 DIAGNOSIS — Z7982 Long term (current) use of aspirin: Secondary | ICD-10-CM | POA: Diagnosis not present

## 2021-07-04 DIAGNOSIS — F32A Depression, unspecified: Secondary | ICD-10-CM | POA: Diagnosis not present

## 2021-07-04 DIAGNOSIS — I2699 Other pulmonary embolism without acute cor pulmonale: Secondary | ICD-10-CM | POA: Diagnosis not present

## 2021-07-04 DIAGNOSIS — Z950 Presence of cardiac pacemaker: Secondary | ICD-10-CM | POA: Diagnosis not present

## 2021-07-04 DIAGNOSIS — I10 Essential (primary) hypertension: Secondary | ICD-10-CM | POA: Diagnosis not present

## 2021-07-04 DIAGNOSIS — J9811 Atelectasis: Secondary | ICD-10-CM | POA: Diagnosis not present

## 2021-07-04 DIAGNOSIS — Z7982 Long term (current) use of aspirin: Secondary | ICD-10-CM | POA: Diagnosis not present

## 2021-07-04 DIAGNOSIS — I4891 Unspecified atrial fibrillation: Secondary | ICD-10-CM | POA: Diagnosis not present

## 2021-07-04 DIAGNOSIS — Z9181 History of falling: Secondary | ICD-10-CM | POA: Diagnosis not present

## 2021-07-04 DIAGNOSIS — Z48812 Encounter for surgical aftercare following surgery on the circulatory system: Secondary | ICD-10-CM | POA: Diagnosis not present

## 2021-07-06 ENCOUNTER — Ambulatory Visit (INDEPENDENT_AMBULATORY_CARE_PROVIDER_SITE_OTHER): Payer: Medicare Other

## 2021-07-06 ENCOUNTER — Other Ambulatory Visit: Payer: Self-pay

## 2021-07-06 DIAGNOSIS — I3139 Other pericardial effusion (noninflammatory): Secondary | ICD-10-CM

## 2021-07-06 DIAGNOSIS — I313 Pericardial effusion (noninflammatory): Secondary | ICD-10-CM

## 2021-07-06 LAB — ECHOCARDIOGRAM LIMITED
Calc EF: 52.2 %
S' Lateral: 3.7 cm
Single Plane A2C EF: 51.9 %
Single Plane A4C EF: 52.5 %

## 2021-07-07 ENCOUNTER — Telehealth: Payer: Self-pay | Admitting: *Deleted

## 2021-07-07 NOTE — Telephone Encounter (Signed)
-----   Message from Theora Gianotti, NP sent at 07/06/2021  4:36 PM EDT ----- Normal heart squeezing function. Normal functioning aortic valve prosthesis.  The pericardial effusion has resolved.  The mitral valve remains mildly leaky, while the tricuspid valve is moderately leaky.  Overall, reassuring findings.

## 2021-07-07 NOTE — Telephone Encounter (Signed)
Left voicemail message to call back for review of results.  

## 2021-07-08 NOTE — Telephone Encounter (Signed)
Spoke with patient. Reviewed results with her and confirmed her upcoming appointment on Monday. She verbalized understanding with no further questions at this time.

## 2021-07-10 NOTE — Progress Notes (Signed)
Date:  07/11/2021   ID:  Deborah Koch, DOB 12-27-1955, MRN [id number]  Patient Location:  [address] [place] [address]   Provider location:   [facility], [place] office  PCP:  Burnard Hawthorne, FNP  Cardiologist:  Arvid Right [facility]  Chief Complaint  Patient presents with   Other    Follow up post ECHO. Patient c/o losing 20 pounds in a month. Meds reviewed verbally with patient.     History of Present Illness:    Deborah Koch is a 65 y.o. female  past medical history of bicuspid aortic valve, moderate aortic valve stenosis , mild -moderate TR 4.1 cm ascending aorta dilation, on echo 2015 not visualized well on echocardiogram since then hypertension,  GERD,  out on disability for scoliosis among other issues prior smoking history Anxiety Takes care of 26 year old mother who has Alzheimer's who presents for routine follow-up of her aortic valve stenosis, dilating ascending aorta s/p aortic surgery and prosthetic valve  Long discussion concerning recent events which have been very stressful  CTA of the chest showed a 4.7 cm fusiform ascending aortic aneurysm.  She was subsequently referred to thoracic surgery and on May 5, she underwent replacement of the ascending aorta and aortic arch with aorto-innominate and aorto-left carotid bypass with supra coronary anastomosis as well as aortic valve replacement.  hospitalizations  status post replacement of the ascending aorta and aortic arch with aorto-innominate and aorto-left carotid bypass with supra coronary anastomosis   aortic valve replacement  rehospitalization with pleural effusions, pericardial effusion, and paroxysmal atrial fibrillation. moderate pericardial effusion and right greater than left pleural effusion.  Echocardiogram July 2022 1. Left ventricular ejection fraction, by estimation, is 55 to 60%. The  left ventricle has normal function.   2. There is no evidence  of pericardial effusion.   3. The mitral valve is normal in structure. Mild mitral valve  regurgitation.   4. Tricuspid valve regurgitation is moderate.   5. The aortic valve has been repaired/replaced. There is a 23 mm Edwards  bovine valve present in the aortic position. Procedure Date: 04/2021.   Appetite is poor, Separated from husband, Looking for a place to live, staying with family Tearful on todays visit Regular exercise or activities   Past Medical History:  Diagnosis Date   Anxiety    Ascending Aortic Aneurysm    a. 04/2021 s/p replacement of the ascending aorta and aortic arch with aorta-innominate and aorta-left carotid bypass with supra coronary anastomosis as well as AVR with 23 mm Edwards pericardial tissue valve.   C. difficile colitis    a. 07/2014.   Congenital bicuspid aortic valve    Depression    Diverticulitis    hospitalized; subsequent c diff infection.    GERD (gastroesophageal reflux disease)    H/O mastitis 2015   Heart murmur    History of cardiac catheterization    a. 03/2021 Nl cors.   History of tobacco abuse    a. 20 yrs, 1/4 ppd - quit.   HTN (hypertension)    Hypokalemia    Multilevel degenerative disc disease    PAF (paroxysmal atrial fibrillation) ([place])    a. 04/2021-->amio. No OAC 2/2 concern for hemopericardium (post-op Ao arch/AVR).   Pleural effusion, bilateral    a. 04/2021 s/p thoracic surgery.   Pneumonia    at age 74   Scoliosis    Severe aortic stenosis w/ congenital bicuspid AoV  a. 04/2021 s/p 31m edwards pericardial tissue valve; b. 05/12/2021 Echo: EF 55-60%, no rwma, Gr2 DD, nl RV fxn, mild BAE, moderate pericardial effusion, mild MR, mild to mod TR, Nl fxn'ing AoV prosthesis.   Past Surgical History:  Procedure Laterality Date   AORTA -INNOMIATE BYPASS N/A 04/21/2021   Procedure: AORTA -ITitus MouldBYPASS;  Surgeon: BGaye Pollack MD;  Location: [facility] OR;  Service: Vascular;  Laterality: N/A;   AORTIC VALVE REPLACEMENT N/A  04/21/2021   Procedure: AORTIC VALVE REPLACEMENT (AVR) ON PUMP WITH 23MM INSPIRIS AORTIC VALVE;  Surgeon: BGaye Pollack MD;  [place] [facility]  Service: Open Heart Surgery;  Laterality: N/A;   BARTHOLIN GLAND CYST EXCISION     BREAST BIOPSY Left    neg   CAROTID-SUBCLAVIAN BYPASS GRAFT Left 04/21/2021   Procedure: AORTA LEFT COMMON CAROTID BYPASS;  Surgeon: BGaye Pollack MD;  [place] [facility]  Service: Vascular;  Laterality: Left;   COLONOSCOPY  2013   Dr. WAllen Norris  COLONOSCOPY WITH PROPOFOL N/A 10/23/2017   Procedure: COLONOSCOPY WITH PROPOFOL;  Surgeon: WLucilla Lame MD;  [facility];  Service: Endoscopy;  Laterality: N/A;   OOPHORECTOMY  2016   OVARY SURGERY  2016   cyst on right ovary   REPLACEMENT ASCENDING AORTA N/A 04/21/2021   Procedure: REPLACEMENT ASCENDING & ARCH AORTA WITH HEMASHIELD PLATINUM GRAFT 28 [id number]  Surgeon: BGaye Pollack MD;  [place] [facility]  Service: Open Heart Surgery;  Laterality: N/A;  CIRC ARREST  RIGHT AXILLARY ARTERY CANNULATION   RIGHT/LEFT HEART CATH AND CORONARY ANGIOGRAPHY N/A 03/24/2021   Procedure: RIGHT/LEFT HEART CATH AND CORONARY ANGIOGRAPHY;  Surgeon: GMinna Merritts MD;  [facility];  Service: Cardiovascular;  Laterality: N/A;   SALPINGECTOMY Bilateral    TEE WITHOUT CARDIOVERSION N/A 04/21/2021   Procedure: TRANSESOPHAGEAL ECHOCARDIOGRAM (TEE);  Surgeon: BGaye Pollack MD;  [place] [facility]  Service: Open Heart Surgery;  Laterality: N/A;      Allergies:   Hydrocodone and Iodine   Social History   Tobacco Use   Smoking status: Light Smoker    Packs/day: 0.25    Years: 20.00    Pack years: 5.00    Types: Cigarettes   Smokeless tobacco: Never  Vaping Use   Vaping Use: Never used  Substance Use Topics   Alcohol use: Yes    Alcohol/week: 12.0 standard drinks    Types: 10 Glasses of wine, 2 Cans of beer per week    Comment:  2-3 glasses of wine every other evening   Drug use: No     Current  Outpatient Medications on File Prior to Visit  Medication Sig Dispense Refill   clonazePAM (KLONOPIN) 0.5 MG tablet Take 1 tablet (0.5 mg total) by mouth 3 (three) times daily as needed for anxiety. 90 tablet 1   escitalopram (LEXAPRO) 10 MG tablet TAKE 1 TABLET BY MOUTH EVERY DAY 90 tablet 1   losartan (COZAAR) 25 MG tablet Take 1 tablet (25 mg total) by mouth daily. 90 tablet 3   ondansetron (ZOFRAN ODT) 4 MG disintegrating tablet Take 1 tablet (4 mg total) by mouth every 8 (eight) hours as needed for nausea or vomiting. 30 tablet 0   rosuvastatin (CRESTOR) 5 MG tablet Take 1 tablet (5 mg total) by mouth daily. 30 tablet 5   amiodarone (PACERONE) 200 MG tablet Take 1 tablet (200 mg total) by mouth daily. (Patient not taking: Reported on 07/11/2021) 30 tablet 3   furosemide (LASIX)  40 MG tablet Take 1 tablet (40 mg total) by mouth daily. 90 tablet 3   metoprolol tartrate (LOPRESSOR) 50 MG tablet Take 1 tablet (50 mg total) by mouth 2 (two) times daily. 180 tablet 3   potassium chloride (KLOR-CON) 10 MEQ tablet Take 1 tablet (10 mEq total) by mouth 2 (two) times daily. (Patient not taking: Reported on 07/11/2021) 14 tablet 0   Current Facility-Administered Medications on File Prior to Visit  Medication Dose Route Frequency Provider Last Rate Last Admin   sodium chloride flush (NS) 0.9 % injection 3 mL  3 mL Intravenous Q12H Visser, Jacquelyn D, PA-C         Family Hx: The patient's family history includes Alcohol abuse in her brother; Anxiety disorder in her mother and sister; Breast cancer in her maternal aunt; Colon cancer in her mother; Depression in her mother and sister; Heart failure in her father; Hypertension in her father and mother; Schizophrenia in her brother. There is no history of Heart attack or Stroke.  ROS:   Please see the history of present illness.    Review of Systems  Constitutional: Negative.   HENT: Negative.    Respiratory: Negative.    Cardiovascular: Negative.    Gastrointestinal: Negative.   Musculoskeletal: Negative.   Neurological: Negative.   Psychiatric/Behavioral:  The patient is nervous/anxious.   All other systems reviewed and are negative.    Labs/Other Tests and Data Reviewed:    Recent Labs: 04/21/2021: ALT 13 05/02/2021: B Natriuretic Peptide 823.2 05/08/2021: Magnesium 2.0 05/27/2021: BUN 6; Creatinine, Ser 0.78; Hemoglobin 9.6; Platelets 382; Potassium 2.9; Sodium 133   Recent Lipid Panel Lab Results  Component Value Date/Time   CHOL 85 05/05/2021 12:47 PM   TRIG 57.0 02/08/2021 09:59 AM   HDL 93.50 02/08/2021 09:59 AM   CHOLHDL 2 02/08/2021 09:59 AM   LDLCALC 82 02/08/2021 09:59 AM    Wt Readings from Last 3 Encounters:  07/11/21 118 lb (53.5 kg)  06/02/21 138 lb (62.6 kg)  05/27/21 138 lb (62.6 kg)     Exam:    BP 130/70 (BP Location: Left Arm, Patient Position: Sitting, Cuff Size: Normal)   Pulse 62   Ht '5\' 4"'$  (1.626 m)   Wt 118 lb (53.5 kg)   SpO2 96%   BMI 20.25 kg/m   Constitutional:  oriented to person, place, and time. No distress.  HENT:  Head: Grossly normal Eyes:  no discharge. No scleral icterus.  Neck: No JVD, no carotid bruits  Cardiovascular: Regular rate and rhythm, no murmurs appreciated Pulmonary/Chest: Clear to auscultation bilaterally, no wheezes or rails Abdominal: Soft.  no distension.  no tenderness.  Musculoskeletal: Normal range of motion Neurological:  normal muscle tone. Coordination normal. No atrophy Skin: Skin warm and dry Psychiatric: normal affect, pleasant   ASSESSMENT & PLAN:    Problem List Items Addressed This Visit       Cardiology Problems   Atherosclerosis of aorta (HCC)     Other   S/P ascending aortic aneurysm repair   Other Visit Diagnoses     PAF (paroxysmal atrial fibrillation) (HCC)    -  Primary   Severe aortic stenosis       Essential hypertension          Aortic stenosis S/p surgery,long recovery Aorta dilation surgery, Long discussion  concerning recovery No medication change  Dilated ascending aorta Approximately the same size, 4.4 cm Aortic root with minimal dilation  Hypertension Blood pressure is well controlled on  today's visit. No changes made to the medications.  Depression Difficulty recovering from recent hospitalizations palpitations Discussed strategies to continue rehab Need to stabilize her weight, strategies discussed with her  Hypokalemia Restarted potassium 10 twice a day Recheck BMP in 3 weeks time   Total encounter time more than 25 minutes  Greater than 50% was spent in counseling and coordination of care with the patient    Signed, Ida Rogue, [place] [facility] [id number], [place], [place] [address]

## 2021-07-11 ENCOUNTER — Other Ambulatory Visit: Payer: Self-pay

## 2021-07-11 ENCOUNTER — Ambulatory Visit: Payer: Medicare Other | Admitting: Cardiovascular Disease

## 2021-07-11 ENCOUNTER — Encounter: Payer: Self-pay | Admitting: Cardiovascular Disease

## 2021-07-11 VITALS — BP 130/70 | HR 62 | Ht 64.0 in | Wt 118.0 lb

## 2021-07-11 DIAGNOSIS — Z8679 Personal history of other diseases of the circulatory system: Secondary | ICD-10-CM

## 2021-07-11 DIAGNOSIS — Z9889 Other specified postprocedural states: Secondary | ICD-10-CM | POA: Diagnosis not present

## 2021-07-11 DIAGNOSIS — I350 Nonrheumatic aortic (valve) stenosis: Secondary | ICD-10-CM

## 2021-07-11 DIAGNOSIS — I10 Essential (primary) hypertension: Secondary | ICD-10-CM | POA: Diagnosis not present

## 2021-07-11 DIAGNOSIS — Z79899 Other long term (current) drug therapy: Secondary | ICD-10-CM

## 2021-07-11 DIAGNOSIS — I700 Atherosclerosis of aorta: Secondary | ICD-10-CM | POA: Diagnosis not present

## 2021-07-11 DIAGNOSIS — I480 Paroxysmal atrial fibrillation: Secondary | ICD-10-CM | POA: Diagnosis not present

## 2021-07-11 MED ORDER — POTASSIUM CHLORIDE CRYS ER 10 MEQ PO TBCR: 10.0000 meq | EXTENDED_RELEASE_TABLET | Freq: Two times a day (BID) | ORAL | 3 refills | Status: DC

## 2021-07-11 NOTE — Patient Instructions (Addendum)
Medication Instructions:  - Stay on potassium 10 meq twice a day  If you need a refill on your cardiac medications before your next appointment, please call your pharmacy.    Lab work: - Your physician recommends that you return for lab work in: 3 weeks (around 08/01/21)- [place] Entrance at [facility] 1st desk on the right to check in, past the screening table Lab hours: Monday- Friday (7:30 am- 5:30 pm)  If you have labs (blood work) drawn today and your tests are completely normal, you will receive your results only by: MyChart Message (if you have MyChart) OR A paper copy in the mail If you have any lab test that is abnormal or we need to change your treatment, we will call you to review the results.   Testing/Procedures: No new testing needed   Follow-Up: At [facility], you and your health needs are our priority.  As part of our continuing mission to provide you with exceptional heart care, we have created designated Provider Care Teams.  These Care Teams include your primary Cardiologist (physician) and Advanced Practice Providers (APPs -  Physician Assistants and Nurse Practitioners) who all work together to provide you with the care you need, when you need it.  You will need a follow up appointment in 6 months  Providers on your designated Care Team:   Murray Hodgkins, NP Christell Faith, PA-C Marrianne Mood, PA-C Cadence Kathlen Mody, [place]  Any Other Special Instructions Will Be Listed Below (If Applicable).  COVID-19 Vaccine Information can be found at: [email or web address].co.uk For questions related to vaccine distribution or appointments, please email [email or web address] or call [phone].

## 2021-07-13 ENCOUNTER — Ambulatory Visit: Payer: Medicare Other | Admitting: Surgery

## 2021-08-05 ENCOUNTER — Telehealth: Payer: Self-pay | Admitting: Family

## 2021-08-05 NOTE — Telephone Encounter (Signed)
Patient informed, Due to the high volume of calls and your symptoms we have to forward your call to our Triage Nurse to expedient your call. Please hold for the transfer.  Patient transferred to [facility]. Due to have extreme lower abdominal pain for the past 3 days,diverticulitis in the past and extreme diarrhea in the mornings.No openings in office or virtual.

## 2021-08-05 NOTE — Telephone Encounter (Signed)
Patient called in and spoke to Urgent care for ongoing stomach pain and diarrhea. Pt was instructed to contact her PCP for an appointment and go to urgent care.  Called and spoke with [facility]. Deborah Koch states that she has been having abdominal pain for 3 days. Pt c/o cramping, diarrhea, and abdominal pain that is a 10 on the pain scale. Pt states that aleve and advil does not help the pain and she was hoping that Joycelyn Schmid could send in a pain medication like percocet without her being seen. Pt states that she knows that she would have to be seen to receive any medication. Deborah Koch states that her pain starts in the morning after she drinks water or coffee and when she has a bowel movement it looks like "someone turned on a faucet of brown water". Pt states that she believes the pain is coming from Diverticulitis as she has had diverticulitis before. Pt states that the diarrhea is getting better and the pain comes and goes. She states that it feels like "labor pains" and is only present in the morning and after eating. Pt has no fever and states that she is not dehydrated. Pt is currently staying with her sister and states that her sister is having the same symptoms but without the abdominal pain. Deborah Koch is eating bland food like chicken and rice with broth. Pt states that she is going through a lot of stress and feels that this is stress related. I informed her that margaret would want her to be evaluated and to see what is causing her to have diarrhea. Pt declines coming into the office for possible stool samples and declines going to urgent care or the ED for her pain. Pt states that she is ok and only called in order to receive medication. Pt states that she does not want a call back from our office and for [place] to forget that she called as she is not going to the hospital or urgent care to be evaluated. Pt states that if she still feels bad by next week, she will go to the urgent care.

## 2021-08-08 ENCOUNTER — Emergency Department: Payer: Medicare Other

## 2021-08-08 ENCOUNTER — Other Ambulatory Visit: Payer: Self-pay

## 2021-08-08 ENCOUNTER — Inpatient Hospital Stay
Admission: EM | Admit: 2021-08-08 | Discharge: 2021-08-11 | DRG: 392 | Disposition: A | Discharge status: Home / Self Care | Payer: Medicare Other | Attending: Internal Medicine | Admitting: Internal Medicine

## 2021-08-08 DIAGNOSIS — E43 Unspecified severe protein-calorie malnutrition: Secondary | ICD-10-CM | POA: Diagnosis not present

## 2021-08-08 DIAGNOSIS — K5720 Diverticulitis of large intestine with perforation and abscess without bleeding: Secondary | ICD-10-CM | POA: Diagnosis not present

## 2021-08-08 DIAGNOSIS — Z803 Family history of malignant neoplasm of breast: Secondary | ICD-10-CM | POA: Diagnosis not present

## 2021-08-08 DIAGNOSIS — F32A Depression, unspecified: Secondary | ICD-10-CM | POA: Diagnosis present

## 2021-08-08 DIAGNOSIS — I480 Paroxysmal atrial fibrillation: Secondary | ICD-10-CM | POA: Diagnosis not present

## 2021-08-08 DIAGNOSIS — K5900 Constipation, unspecified: Secondary | ICD-10-CM

## 2021-08-08 DIAGNOSIS — Z818 Family history of other mental and behavioral disorders: Secondary | ICD-10-CM

## 2021-08-08 DIAGNOSIS — F17210 Nicotine dependence, cigarettes, uncomplicated: Secondary | ICD-10-CM | POA: Diagnosis present

## 2021-08-08 DIAGNOSIS — Z8249 Family history of ischemic heart disease and other diseases of the circulatory system: Secondary | ICD-10-CM

## 2021-08-08 DIAGNOSIS — E440 Moderate protein-calorie malnutrition: Secondary | ICD-10-CM | POA: Diagnosis not present

## 2021-08-08 DIAGNOSIS — K219 Gastro-esophageal reflux disease without esophagitis: Secondary | ICD-10-CM | POA: Diagnosis not present

## 2021-08-08 DIAGNOSIS — I700 Atherosclerosis of aorta: Secondary | ICD-10-CM | POA: Diagnosis present

## 2021-08-08 DIAGNOSIS — I10 Essential (primary) hypertension: Secondary | ICD-10-CM | POA: Diagnosis not present

## 2021-08-08 DIAGNOSIS — E785 Hyperlipidemia, unspecified: Secondary | ICD-10-CM | POA: Diagnosis present

## 2021-08-08 DIAGNOSIS — F419 Anxiety disorder, unspecified: Secondary | ICD-10-CM | POA: Diagnosis present

## 2021-08-08 DIAGNOSIS — Z800 Family history of malignant neoplasm of digestive organs: Secondary | ICD-10-CM | POA: Diagnosis not present

## 2021-08-08 DIAGNOSIS — Z6820 Body mass index (BMI) 20.0-20.9, adult: Secondary | ICD-10-CM | POA: Diagnosis not present

## 2021-08-08 DIAGNOSIS — R1030 Lower abdominal pain, unspecified: Secondary | ICD-10-CM

## 2021-08-08 DIAGNOSIS — Z20822 Contact with and (suspected) exposure to covid-19: Secondary | ICD-10-CM | POA: Diagnosis not present

## 2021-08-08 DIAGNOSIS — R109 Unspecified abdominal pain: Secondary | ICD-10-CM | POA: Diagnosis not present

## 2021-08-08 DIAGNOSIS — K5792 Diverticulitis of intestine, part unspecified, without perforation or abscess without bleeding: Secondary | ICD-10-CM | POA: Diagnosis not present

## 2021-08-08 DIAGNOSIS — Z79899 Other long term (current) drug therapy: Secondary | ICD-10-CM | POA: Diagnosis not present

## 2021-08-08 DIAGNOSIS — L0291 Cutaneous abscess, unspecified: Secondary | ICD-10-CM

## 2021-08-08 DIAGNOSIS — Z811 Family history of alcohol abuse and dependence: Secondary | ICD-10-CM | POA: Diagnosis not present

## 2021-08-08 DIAGNOSIS — E871 Hypo-osmolality and hyponatremia: Secondary | ICD-10-CM | POA: Diagnosis not present

## 2021-08-08 DIAGNOSIS — E878 Other disorders of electrolyte and fluid balance, not elsewhere classified: Secondary | ICD-10-CM | POA: Diagnosis not present

## 2021-08-08 DIAGNOSIS — Z952 Presence of prosthetic heart valve: Secondary | ICD-10-CM | POA: Diagnosis not present

## 2021-08-08 LAB — URINALYSIS, COMPLETE (UACMP) WITH MICROSCOPIC
Bacteria, UA: NONE SEEN
Bilirubin Urine: NEGATIVE
Glucose, UA: NEGATIVE mg/dL
Hgb urine dipstick: NEGATIVE
Ketones, ur: NEGATIVE mg/dL
Leukocytes,Ua: NEGATIVE
Nitrite: NEGATIVE
Protein, ur: NEGATIVE mg/dL
RBC / HPF: NONE SEEN RBC/hpf (ref 0–5)
Specific Gravity, Urine: 1.01 (ref 1.005–1.030)
Squamous Epithelial / HPF: NONE SEEN (ref 0–5)
WBC, UA: NONE SEEN WBC/hpf (ref 0–5)
pH: 7 (ref 5.0–8.0)

## 2021-08-08 LAB — LIPASE, BLOOD: Lipase: 123 U/L — ABNORMAL HIGH (ref 11–51)

## 2021-08-08 LAB — COMPREHENSIVE METABOLIC PANEL
ALT: 18 U/L (ref 0–44)
AST: 21 U/L (ref 15–41)
Albumin: 4 g/dL (ref 3.5–5.0)
Alkaline Phosphatase: 106 U/L (ref 38–126)
Anion gap: 8 (ref 5–15)
BUN: 11 mg/dL (ref 8–23)
CO2: 29 mmol/L (ref 22–32)
Calcium: 9.6 mg/dL (ref 8.9–10.3)
Chloride: 94 mmol/L — ABNORMAL LOW (ref 98–111)
Creatinine, Ser: 0.63 mg/dL (ref 0.44–1.00)
GFR, Estimated: >60 mL/min (ref >60)
Glucose, Bld: 108 mg/dL — ABNORMAL HIGH (ref 70–99)
Potassium: 4.4 mmol/L (ref 3.5–5.1)
Sodium: 131 mmol/L — ABNORMAL LOW (ref 135–145)
Total Bilirubin: 0.6 mg/dL (ref 0.3–1.2)
Total Protein: 7.9 g/dL (ref 6.5–8.1)

## 2021-08-08 LAB — CBC
HCT: 36.2 % (ref 36.0–46.0)
Hemoglobin: 11.9 g/dL — ABNORMAL LOW (ref 12.0–15.0)
MCH: 27 pg (ref 26.0–34.0)
MCHC: 32.9 g/dL (ref 30.0–36.0)
MCV: 82.3 fL (ref 80.0–100.0)
Platelets: 319 10*3/uL (ref 150–400)
RBC: 4.4 MIL/uL (ref 3.87–5.11)
RDW: 17.7 % — ABNORMAL HIGH (ref 11.5–15.5)
WBC: 11.2 10*3/uL — ABNORMAL HIGH (ref 4.0–10.5)
nRBC: 0 % (ref 0.0–0.2)

## 2021-08-08 MED ORDER — METOPROLOL TARTRATE 50 MG PO TABS
50.0000 mg | ORAL_TABLET | Freq: Two times a day (BID) | ORAL | Status: DC
  Administered 2021-08-09 – 2021-08-11 (×5): 50 mg via ORAL
  Filled 2021-08-08 (×6): qty 1

## 2021-08-08 MED ORDER — ONDANSETRON 4 MG PO TBDP: 4.0000 mg | ORAL_TABLET | Freq: Three times a day (TID) | ORAL | Status: DC | PRN

## 2021-08-08 MED ORDER — IOHEXOL 350 MG/ML SOLN
75.0000 mL | Freq: Once | INTRAVENOUS | Status: AC | PRN
  Administered 2021-08-08: 75 mL via INTRAVENOUS

## 2021-08-08 MED ORDER — ROSUVASTATIN CALCIUM 10 MG PO TABS
5.0000 mg | ORAL_TABLET | Freq: Every day | ORAL | Status: DC
  Administered 2021-08-09 – 2021-08-11 (×3): 5 mg via ORAL
  Filled 2021-08-08 (×3): qty 1

## 2021-08-08 MED ORDER — ENOXAPARIN SODIUM 40 MG/0.4ML IJ SOSY
40.0000 mg | PREFILLED_SYRINGE | INTRAMUSCULAR | Status: DC
  Administered 2021-08-09 – 2021-08-10 (×2): 40 mg via SUBCUTANEOUS
  Filled 2021-08-08 (×3): qty 0.4

## 2021-08-08 MED ORDER — PIPERACILLIN-TAZOBACTAM 3.375 G IVPB 30 MIN
3.3750 g | Freq: Three times a day (TID) | INTRAVENOUS | Status: DC
  Administered 2021-08-08 – 2021-08-11 (×8): 3.375 g via INTRAVENOUS
  Filled 2021-08-08 (×16): qty 50

## 2021-08-08 MED ORDER — MORPHINE SULFATE (PF) 2 MG/ML IV SOLN
2.0000 mg | INTRAVENOUS | Status: DC | PRN
Start: 2021-08-08 — End: 2021-08-11
  Administered 2021-08-08 – 2021-08-11 (×10): 2 mg via INTRAVENOUS
  Filled 2021-08-08 (×10): qty 1

## 2021-08-08 MED ORDER — SODIUM CHLORIDE 0.9 % IV SOLN: INTRAVENOUS | Status: DC

## 2021-08-08 MED ORDER — LOSARTAN POTASSIUM 25 MG PO TABS
25.0000 mg | ORAL_TABLET | Freq: Every day | ORAL | Status: DC
  Administered 2021-08-09 – 2021-08-11 (×3): 25 mg via ORAL
  Filled 2021-08-08 (×3): qty 1

## 2021-08-08 MED ORDER — SODIUM CHLORIDE 0.9 % IV SOLN
3.0000 g | Freq: Once | INTRAVENOUS | Status: AC
  Administered 2021-08-08: 3 g via INTRAVENOUS
  Filled 2021-08-08: qty 8

## 2021-08-08 MED ORDER — ACETAMINOPHEN 325 MG PO TABS
650.0000 mg | ORAL_TABLET | Freq: Four times a day (QID) | ORAL | Status: DC | PRN
  Filled 2021-08-08: qty 2

## 2021-08-08 MED ORDER — CLONAZEPAM 0.5 MG PO TABS
0.5000 mg | ORAL_TABLET | Freq: Three times a day (TID) | ORAL | Status: DC | PRN
  Administered 2021-08-09 – 2021-08-10 (×4): 0.5 mg via ORAL
  Filled 2021-08-08 (×4): qty 1

## 2021-08-08 MED ORDER — MAGNESIUM HYDROXIDE 400 MG/5ML PO SUSP: 30.0000 mL | Freq: Every day | ORAL | Status: DC | PRN

## 2021-08-08 MED ORDER — ONDANSETRON HCL 4 MG/2ML IJ SOLN: 4.0000 mg | Freq: Four times a day (QID) | INTRAMUSCULAR | Status: DC | PRN

## 2021-08-08 MED ORDER — POTASSIUM CHLORIDE CRYS ER 10 MEQ PO TBCR
10.0000 meq | EXTENDED_RELEASE_TABLET | Freq: Two times a day (BID) | ORAL | Status: DC
  Administered 2021-08-09 – 2021-08-11 (×5): 10 meq via ORAL
  Filled 2021-08-08 (×5): qty 1

## 2021-08-08 MED ORDER — ONDANSETRON HCL 4 MG PO TABS: 4.0000 mg | ORAL_TABLET | Freq: Four times a day (QID) | ORAL | Status: DC | PRN

## 2021-08-08 MED ORDER — ACETAMINOPHEN 650 MG RE SUPP: 650.0000 mg | Freq: Four times a day (QID) | RECTAL | Status: DC | PRN

## 2021-08-08 MED ORDER — TRAZODONE HCL 50 MG PO TABS: 25.0000 mg | ORAL_TABLET | Freq: Every evening | ORAL | Status: DC | PRN

## 2021-08-08 MED ORDER — FUROSEMIDE 40 MG PO TABS
40.0000 mg | ORAL_TABLET | Freq: Every day | ORAL | Status: DC
  Administered 2021-08-09 – 2021-08-11 (×3): 40 mg via ORAL
  Filled 2021-08-08 (×3): qty 1

## 2021-08-08 MED ORDER — ESCITALOPRAM OXALATE 10 MG PO TABS
10.0000 mg | ORAL_TABLET | Freq: Every day | ORAL | Status: DC
  Administered 2021-08-09 – 2021-08-11 (×3): 10 mg via ORAL
  Filled 2021-08-08 (×3): qty 1

## 2021-08-08 MED ORDER — IOHEXOL 9 MG/ML PO SOLN
1000.0000 mL | Freq: Once | ORAL | Status: AC | PRN
  Administered 2021-08-08: 1000 mL via ORAL

## 2021-08-08 NOTE — ED Notes (Signed)
Pt has completed all of CT contrast PO. Called CT to advise. They will come and get her when it is her turn in line.

## 2021-08-08 NOTE — ED Notes (Signed)
RN to bedside to answer call bell. Pt needs to ambulate to toilet. Unhooked and she walked to bathroom. Then back to bed.

## 2021-08-08 NOTE — ED Notes (Signed)
Per 2c charge, pt needs COVID results to go to floor.

## 2021-08-08 NOTE — ED Provider Notes (Addendum)
[facility] Emergency Department Provider Note   ____________________________________________   Event Date/Time   First MD Initiated Contact with Patient 08/08/21 1117     (approximate)  I have reviewed the triage vital signs and the nursing notes.   HISTORY  Chief Complaint Abdominal Pain    HPI Deborah Koch is a 65 y.o. female patient reports last week she had diarrhea.  This improved and then she got constipated and now she is having severe crampy abdominal pain below the umbilicus.  It last for about a minute and then goes away.  It wakes her up in the middle of the night.  Usually happens after she eats.  She reports constipation since the diarrhea has finished.  She is not nauseated or vomiting.         Past Medical History:  Diagnosis Date   Anxiety    Ascending Aortic Aneurysm    a. 04/2021 s/p replacement of the ascending aorta and aortic arch with aorta-innominate and aorta-left carotid bypass with supra coronary anastomosis as well as AVR with 23 mm Edwards pericardial tissue valve.   C. difficile colitis    a. 07/2014.   Congenital bicuspid aortic valve    Depression    Diverticulitis    hospitalized; subsequent c diff infection.    GERD (gastroesophageal reflux disease)    H/O mastitis 2015   Heart murmur    History of cardiac catheterization    a. 03/2021 Nl cors.   History of tobacco abuse    a. 20 yrs, 1/4 ppd - quit.   HTN (hypertension)    Hypokalemia    Multilevel degenerative disc disease    PAF (paroxysmal atrial fibrillation) ([place])    a. 04/2021-->amio. No OAC 2/2 concern for hemopericardium (post-op Ao arch/AVR).   Pleural effusion, bilateral    a. 04/2021 s/p thoracic surgery.   Pneumonia    at age 65   Scoliosis    Severe aortic stenosis w/ congenital bicuspid AoV    a. 04/2021 s/p 65m edwards pericardial tissue valve; b. 05/12/2021 Echo: EF 55-60%, no rwma, Gr2 DD, nl RV fxn, mild BAE, moderate pericardial  effusion, mild MR, mild to mod TR, Nl fxn'ing AoV prosthesis.    Patient Active Problem List   Diagnosis Date Noted   Sepsis due to undetermined organism ([place] 05/02/2021   Hypertensive emergency 05/02/2021   S/P ascending aortic aneurysm repair 04/21/2021   HLD (hyperlipidemia) 09/15/2020   Fatigue 01/28/2020   A[place]ros[place] of [place] ([place] 06/27/2019   Bronchitis 01/08/2019   Personal history of colonic polyps    Benign neoplasm of ascending colon    LLQ abdominal pain 12/26/2016   Routine physical examination 10/09/2016   History of Clostridium difficile infection 12/21/2015   HTN (hypertension) 06/08/2015   Severe aortic valve stenosis    Congenital bicuspid aortic valve    Ascending aorta dilation (HCC)    Anxiety and depression 12/19/2013   GERD (gastroesophageal reflux disease) 12/19/2013    Past Surgical History:  Procedure Laterality Date   AORTA -INNOMIATE BYPASS N/A 04/21/2021   Procedure: AORTA -ITitus MouldBYPASS;  Surgeon: BGaye Pollack MD;  Location: [facility] OR;  Service: Vascular;  Laterality: N/A;   AORTIC VALVE REPLACEMENT N/A 04/21/2021   Procedure: AORTIC VALVE REPLACEMENT (AVR) ON PUMP WITH 23MM INSPIRIS AORTIC VALVE;  Surgeon: BGaye Pollack MD;  [facility]  Service: Open Heart Surgery;  Laterality: N/A;   BARTHOLIN GLAND CYST EXCISION     BREAST  BIOPSY Left    neg   CAROTID-SUBCLAVIAN BYPASS GRAFT Left 04/21/2021   Procedure: AORTA LEFT COMMON CAROTID BYPASS;  Surgeon: Gaye Pollack, MD;  Location: [place] [facility];  Service: Vascular;  Laterality: Left;   COLONOSCOPY  2013   Dr. Allen Norris   COLONOSCOPY WITH PROPOFOL N/A 10/23/2017   Procedure: COLONOSCOPY WITH PROPOFOL;  Surgeon: Lucilla Lame, MD;  Location: [facility] ENDOSCOPY;  Service: Endoscopy;  Laterality: N/A;   OOPHORECTOMY  2016   OVARY SURGERY  2016   cyst on right ovary   REPLACEMENT ASCENDING AORTA N/A 04/21/2021   Procedure: REPLACEMENT ASCENDING & ARCH AORTA WITH HEMASHIELD PLATINUM GRAFT 28 [id number];   Surgeon: Gaye Pollack, MD;  Location: [place];  Service: Open Heart Surgery;  Laterality: N/A;  CIRC ARREST  RIGHT AXILLARY ARTERY CANNULATION   RIGHT/LEFT HEART CATH AND CORONARY ANGIOGRAPHY N/A 03/24/2021   Procedure: RIGHT/LEFT HEART CATH AND CORONARY ANGIOGRAPHY;  Surgeon: Minna Merritts, MD;  Location: [place] [facility];  Service: Cardiovascular;  Laterality: N/A;   SALPINGECTOMY Bilateral    TEE WITHOUT CARDIOVERSION N/A 04/21/2021   Procedure: TRANSESOPHAGEAL ECHOCARDIOGRAM (TEE);  Surgeon: Gaye Pollack, MD;  Location: [place];  Service: Open Heart Surgery;  Laterality: N/A;    Prior to Admission medications   Medication Sig Start Date End Date Taking? Authorizing Provider  clonazePAM (KLONOPIN) 0.5 MG tablet Take 1 tablet (0.5 mg total) by mouth 3 (three) times daily as needed for anxiety. 05/20/21   Burnard Hawthorne, FNP  escitalopram (LEXAPRO) 10 MG tablet TAKE 1 TABLET BY MOUTH EVERY DAY 06/21/21   Burnard Hawthorne, FNP  furosemide (LASIX) 40 MG tablet Take 1 tablet (40 mg total) by mouth daily. 05/20/21 06/19/21  Theora Gianotti, NP  losartan (COZAAR) 25 MG tablet Take 1 tablet (25 mg total) by mouth daily. 05/20/21 08/18/21  Theora Gianotti, NP  metoprolol tartrate (LOPRESSOR) 50 MG tablet Take 1 tablet (50 mg total) by mouth 2 (two) times daily. 05/20/21 06/19/21  Theora Gianotti, NP  ondansetron (ZOFRAN ODT) 4 MG disintegrating tablet Take 1 tablet (4 mg total) by mouth every 8 (eight) hours as needed for nausea or vomiting. 06/17/21   Burnard Hawthorne, FNP  potassium chloride (KLOR-CON) 10 MEQ tablet Take 1 tablet (10 mEq total) by mouth 2 (two) times daily. 07/11/21   Minna Merritts, MD  rosuvastatin (CRESTOR) 5 MG tablet Take 1 tablet (5 mg total) by mouth daily. 03/22/21 07/11/21  Marrianne Mood D, PA-C    Allergies Hydrocodone  Family History  Problem Relation Age of Onset   Hypertension Mother    Colon cancer Mother    Anxiety disorder Mother     Depression Mother    Heart failure Father    Hypertension Father    Depression Sister    Anxiety disorder Sister    Alcohol abuse Brother    Schizophrenia Brother    Breast cancer Maternal Aunt    Heart attack Neg Hx    Stroke Neg Hx     Social History Social History   Tobacco Use   Smoking status: Light Smoker    Packs/day: 0.25    Years: 20.00    Pack years: 5.00    Types: Cigarettes   Smokeless tobacco: Never  Vaping Use   Vaping Use: Never used  Substance Use Topics   Alcohol use: Yes    Alcohol/week: 12.0 standard drinks    Types: 10 Glasses of wine, 2 Cans of beer  per week    Comment:  2-3 glasses of wine every other evening   Drug use: No    Review of Systems  Constitutional: No fever/chills Eyes: No visual changes. ENT: No sore throat. Cardiovascular: Denies chest pain. Respiratory: Denies shortness of breath. Gastrointestinal: No abdominal pain.  No nausea, no vomiting.  No diarrhea.  No constipation. Genitourinary: Negative for dysuria. Musculoskeletal: Negative for back pain. Skin: Negative for rash. Neurological: Negative for headaches, focal weakness  ____________________________________________   PHYSICAL EXAM:  VITAL SIGNS: ED Triage Vitals  Enc Vitals Group     BP 08/08/21 1013 124/81     Pulse Rate 08/08/21 1013 (!) 57     Resp 08/08/21 1013 16     Temp 08/08/21 1013 98.5 F (36.9 C)     Temp Source 08/08/21 1013 Oral     SpO2 08/08/21 1013 99 %     Weight --      Height --      Head Circumference --      Peak Flow --      Pain Score 08/08/21 1009 9     Pain Loc --      Pain Edu? --      Excl. in [place]? --     Constitutional: Alert and oriented. Well appearing and in no acute distress. Eyes: Conjunctivae are normal. PER EOMI. Head: Atraumatic. Nose: No congestion/rhinnorhea. Mouth/Throat: Mucous membranes are moist.  Oropharynx non-erythematous. Neck: No stridor.   ardiovascular: Normal rate, regular rhythm. Grossly normal  heart sounds.  Good peripheral circulation. Respiratory: Normal respiratory effort.  No retractions. Lungs CTAB. Gastrointestinal: Soft mild lower abdominal tenderness no distention. No abdominal bruits. No CVA tenderness. Musculoskeletal: No lower extremity tenderness nor edema.   Neurologic:  Normal speech and language. No gross focal neurologic deficits are appreciated. No gait instability. Skin:  Skin is warm, dry and intact. No rash noted.  ____________________________________________   LABS (all labs ordered are listed, but only abnormal results are displayed)  Labs Reviewed  LIPASE, BLOOD - Abnormal; Notable for the following components:      Result Value   Lipase 123 (*)    All other components within normal limits  COMPREHENSIVE METABOLIC PANEL - Abnormal; Notable for the following components:   Sodium 131 (*)    Chloride 94 (*)    Glucose, Bld 108 (*)    All other components within normal limits  CBC - Abnormal; Notable for the following components:   WBC 11.2 (*)    Hemoglobin 11.9 (*)    RDW 17.7 (*)    All other components within normal limits  URINALYSIS, COMPLETE (UACMP) WITH MICROSCOPIC   ____________________________________________  EKG   ____________________________________________  RADIOLOGY Gertha Calkin, personally viewed and evaluated these images (plain radiographs) as part of my medical decision making, as well as reviewing the written report by the radiologist.  ED MD interpretation: CT read by radiology reviewed by me shows diverticulitis with a small associated abscess.  Official radiology report(s): CT ABDOMEN PELVIS W CONTRAST  Result Date: 08/08/2021 CLINICAL DATA:  Severe lower abdominal pain for 1 week. Elevated lipase. Constipation. EXAM: CT ABDOMEN AND PELVIS WITH CONTRAST TECHNIQUE: Multidetector CT imaging of the abdomen and pelvis was performed using the standard protocol following bolus administration of intravenous contrast.  CONTRAST:  38m OMNIPAQUE IOHEXOL 350 MG/ML SOLN COMPARISON:  05/02/2021 FINDINGS: Lower Chest: No acute findings. Previously seen pericardial effusion has resolved since prior exam. Hepatobiliary: No hepatic masses identified. Two  sub-cm cysts are noted in the left hepatic lobe. Gallbladder is unremarkable. No evidence of biliary ductal dilatation. Pancreas:  No mass or inflammatory changes. Spleen: There has been near complete resolution of subcapsular perisplenic hematoma since prior study. No evidence of splenic mass or splenomegaly. Adrenals/Urinary Tract: No masses identified. No evidence of ureteral calculi or hydronephrosis. Stomach/Bowel: Mild sigmoid diverticulitis is seen. A rim enhancing diverticular abscess is seen measuring 2.7 x 2.5 cm. No evidence of extraluminal air. Large amount of stool is seen throughout the colon. Vascular/Lymphatic: No pathologically enlarged lymph nodes. No acute vascular findings. Aortic atherosclerotic calcification noted. Reproductive:  No mass or other significant abnormality. Other:  None. Musculoskeletal: No suspicious bone lesions identified. Moderate thoracolumbar scoliosis again noted. IMPRESSION: Mild sigmoid diverticulitis, with 2.7 cm diverticular abscess. Large stool burden noted; recommend clinical correlation for possible constipation. Aortic Atherosclerosis (ICD10-I70.0). Electronically Signed   By: Marlaine Hind M.D.   On: 08/08/2021 14:00    ____________________________________________   PROCEDURES  Procedure(s) performed (including Critical Care): Critical care time 20 minutes this includes evaluating the patient reviewing her old records looking at the [place] speaking with the hospitalist and talking to interventional radiology about whether or not the patient is a candidate for drainage.  Interventional radiology does not think so I think the patient should get some IV antibiotics and then if need be we can review CT scan right now interventional  radiology says that the abscess is very hard to get too small.  They recommend antibiotics and observation.  Procedures   ____________________________________________   INITIAL IMPRESSION / ASSESSMENT AND PLAN / ED COURSE  Patient with diverticulitis with abscess.  This meets criteria for inpatient treatment.  There is also constipation.  There is no sign of obstruction.  We will give her some antibiotics and plan on getting her in the hospital.  There is no sign of other intra-abdominal pathology and her symptoms are all accounted for by the findings I discussed above.              ____________________________________________   FINAL CLINICAL IMPRESSION(S) / ED DIAGNOSES  Final diagnoses:  Lower abdominal pain  Diverticulitis  Abscess  Constipation, unspecified constipation type     ED Discharge Orders     None        Note:  This document was prepared using Dragon voice recognition software and may include unintentional dictation errors.    Nena Polio, MD 08/08/21 1413    Nena Polio, MD 08/08/21 [id number]

## 2021-08-08 NOTE — Consult Note (Signed)
SURGICAL CONSULTATION NOTE   HISTORY OF PRESENT ILLNESS (HPI):  65 y.o. female presented to [facility] ED for evaluation of abdominal pain and constipation. Patient reports she started having abdominal pain a week ago.  The pain has been progressively getting worse.  The pain woke her up this morning at 3 AM severe on the left lower quadrant.  The pain sometimes radiates to the right lower quadrant.  Aggravating factors is applying pressure.  There has been no alleviating factors.  The patient denies any fever or chills.  Patient reports that last bowel movement was 5 days ago.  In the ED she was found with mild leukocytosis.  CT scan of the abdomen and pelvis shows a small diverticular abscess.  No free air or free fluid.  Impression evaluated the images.  Surgery is consulted by Dr. Sidney Ace in this context for evaluation and management of acute diverticulitis with abscess.  PAST MEDICAL HISTORY (PMH):  Past Medical History:  Diagnosis Date   Anxiety    Ascending Aortic Aneurysm    a. 04/2021 s/p replacement of the ascending aorta and aortic arch with aorta-innominate and aorta-left carotid bypass with supra coronary anastomosis as well as AVR with 23 mm Edwards pericardial tissue valve.   C. difficile colitis    a. 07/2014.   Congenital bicuspid aortic valve    Depression    Diverticulitis    hospitalized; subsequent c diff infection.    GERD (gastroesophageal reflux disease)    H/O mastitis 2015   Heart murmur    History of cardiac catheterization    a. 03/2021 Nl cors.   History of tobacco abuse    a. 20 yrs, 1/4 ppd - quit.   HTN (hypertension)    Hypokalemia    Multilevel degenerative disc disease    PAF (paroxysmal atrial fibrillation) ([place])    a. 04/2021-->amio. No OAC 2/2 concern for hemopericardium (post-op Ao arch/AVR).   Pleural effusion, bilateral    a. 04/2021 s/p thoracic surgery.   Pneumonia    at age 53   Scoliosis    Severe aortic stenosis w/ congenital bicuspid AoV    a.  04/2021 s/p 78m edwards pericardial tissue valve; b. 05/12/2021 Echo: EF 55-60%, no rwma, Gr2 DD, nl RV fxn, mild BAE, moderate pericardial effusion, mild MR, mild to mod TR, Nl fxn'ing AoV prosthesis.     PAST SURGICAL HISTORY ([place]Phil[place]:  Past Surgical History:  Procedure Laterality Date   AORTA -INNOMIATE BYPASS N/A 04/21/2021   Procedure: AORTA -ITitus MouldBYPASS;  Surgeon: BGaye Pollack MD;  Location: [facility];  Service: Vascular;  Laterality: N/A;   AORTIC VALVE REPLACEMENT N/A 04/21/2021   Procedure: AORTIC VALVE REPLACEMENT (AVR) ON PUMP WITH 23MM INSPIRIS AORTIC VALVE;  Surgeon: BGaye Pollack MD;  [facility]  Service: Open Heart Surgery;  Laterality: N/A;   BARTHOLIN GLAND CYST EXCISION     BREAST BIOPSY Left    neg   CAROTID-SUBCLAVIAN BYPASS GRAFT Left 04/21/2021   Procedure: AORTA LEFT COMMON CAROTID BYPASS;  Surgeon: BGaye Pollack MD;  [facility]  Service: Vascular;  Laterality: Left;   COLONOSCOPY  2013   Dr. WAllen Norris  COLONOSCOPY WITH PROPOFOL N/A 10/23/2017   Procedure: COLONOSCOPY WITH PROPOFOL;  Surgeon: WLucilla Lame MD;  [facility]  Service: Endoscopy;  Laterality: N/A;   OOPHORECTOMY  2016   OVARY SURGERY  2016   cyst on right ovary   REPLACEMENT ASCENDING AORTA N/A 04/21/2021   Procedure: REPLACEMENT ASCENDING &  [place] PLATINUM GRAFT 28 [id number];  Surgeon: Gaye Pollack, MD;  Location: [place];  Service: Open Heart Surgery;  Laterality: N/A;  CIRC ARREST  RIGHT AXILLARY ARTERY CANNULATION   RIGHT/LEFT HEART CATH AND CORONARY ANGIOGRAPHY N/A 03/24/2021   Procedure: RIGHT/LEFT HEART CATH AND CORONARY ANGIOGRAPHY;  Surgeon: Minna Merritts, MD;  Location: [place] [facility];  Service: Cardiovascular;  Laterality: N/A;   SALPINGECTOMY Bilateral    TEE WITHOUT CARDIOVERSION N/A 04/21/2021   Procedure: TRANSESOPHAGEAL ECHOCARDIOGRAM (TEE);  Surgeon: Gaye Pollack, MD;  Location: [place];  Service: Open Heart Surgery;  Laterality:  N/A;     MEDICATIONS:  Prior to Admission medications   Medication Sig Start Date End Date Taking? Authorizing Provider  clonazePAM (KLONOPIN) 0.5 MG tablet Take 1 tablet (0.5 mg total) by mouth 3 (three) times daily as needed for anxiety. 05/20/21  Yes Burnard Hawthorne, FNP  escitalopram (LEXAPRO) 10 MG tablet TAKE 1 TABLET BY MOUTH EVERY DAY 06/21/21  Yes Arnett, [name], FNP  furosemide (LASIX) 40 MG tablet Take 40 mg by mouth daily.   Yes [provider]  losartan (COZAAR) 25 MG tablet Take 1 tablet (25 mg total) by mouth daily. 05/20/21 08/18/21 Yes Theora Gianotti, NP  metoprolol tartrate (LOPRESSOR) 50 MG tablet Take 50 mg by mouth 2 (two) times daily.   Yes [provider]  ondansetron (ZOFRAN ODT) 4 MG disintegrating tablet Take 1 tablet (4 mg total) by mouth every 8 (eight) hours as needed for nausea or vomiting. 06/17/21  Yes Arnett, [name], FNP  potassium chloride (KLOR-CON) 10 MEQ tablet Take 1 tablet (10 mEq total) by mouth 2 (two) times daily. 07/11/21  Yes Minna Merritts, MD  rosuvastatin (CRESTOR) 5 MG tablet Take 5 mg by mouth daily.   Yes [provider]     ALLERGIES:  Allergies  Allergen Reactions   Hydrocodone Hives and Rash     SOCIAL HISTORY:  Social History   Socioeconomic History   Marital status: Divorced    Spouse name: Not on file   Number of children: Not on file   Years of education: Not on file   Highest education level: Associate degree: occupational, Hotel manager, or vocational program  Occupational History   Not on file  Tobacco Use   Smoking status: Light Smoker    Packs/day: 0.25    Years: 20.00    Pack years: 5.00    Types: Cigarettes   Smokeless tobacco: Never  Vaping Use   Vaping Use: Never used  Substance and Sexual Activity   Alcohol use: Yes    Alcohol/week: 12.0 standard drinks    Types: 10 Glasses of wine, 2 Cans of beer per week    Comment:  2-3 glasses of wine every other evening   Drug  use: No   Sexual activity: Yes    Birth control/protection: None  Other Topics Concern   Not on file  Social History Narrative   Moved to [place] 07/2020   Separated from second husband.       Sister is patient of mine      Divorced first husband ( husband had drug addiction)       2 children who live in [place]      Not working since she got C.diff infection 2015 while working at [employer].      Started yoga   Social Determinants of Health   Financial Resource Strain: Medium Risk  Difficulty of Paying Living Expenses: Somewhat hard  Food Insecurity: Food Insecurity Present   Worried About [place] in the Last Year: Sometimes true   Ran Out of Food in the Last Year: Sometimes true  Transportation Needs: No Transportation Needs   Lack of Transportation (Medical): No   Lack of Transportation (Non-Medical): No  Physical Activity: Insufficiently Active   Days of Exercise per Week: 2 days   Minutes of Exercise per Session: 20 min  Stress: Stress Concern Present   Feeling of Stress : Rather much  Social Connections: Unknown   Frequency of Communication with Friends and Family: Once a week   Frequency of Social Gatherings with Friends and Family: Twice a week   Attends Religious Services: Patient refused   Printmaker: Not on file   Attends Archivist Meetings: Not on file   Marital Status: Separated  Intimate Partner Violence: Not on file      FAMILY HISTORY:  Family History  Problem Relation Age of Onset   Hypertension Mother    Colon cancer Mother    Anxiety disorder Mother    Depression Mother    Heart failure Father    Hypertension Father    Depression Sister    Anxiety disorder Sister    Alcohol abuse Brother    Schizophrenia Brother    Breast cancer Maternal Aunt    Heart attack Neg Hx    Stroke Neg Hx      REVIEW OF SYSTEMS:  Constitutional: denies weight loss, fever, chills, or sweats  Eyes:  denies any other vision changes, history of eye injury  ENT: denies sore throat, hearing problems  Respiratory: denies shortness of breath, wheezing  Cardiovascular: denies chest pain, palpitations  Gastrointestinal: Positive abdominal pain, nausea and vomiting.  Positive for constipation Genitourinary: denies burning with urination or urinary frequency Musculoskeletal: denies any other joint pains or cramps  Skin: denies any other rashes or skin discolorations  Neurological: denies any other headache, dizziness, weakness  Psychiatric: denies any other depression, anxiety   All other review of systems were negative   VITAL SIGNS:  Temp:  [98.5 F (36.9 C)] 98.5 F (36.9 C) (08/22 1013) Pulse Rate:  [27-62] 62 (08/22 1900) Resp:  [10-17] 14 (08/22 1900) BP: (113-146)/(62-92) 113/92 (08/22 1900) SpO2:  [93 %-100 %] 93 % (08/22 1900)             INTAKE/OUTPUT:  This shift: No intake/output data recorded.  Last 2 shifts: '@IOLAST2SHIFTS'$ @   PHYSICAL EXAM:  Constitutional:  -- Normal body habitus  -- Awake, alert, and oriented x3  Eyes:  -- Pupils equally round and reactive to light  -- No scleral icterus  Ear, nose, and throat:  -- No jugular venous distension  Pulmonary:  -- No crackles  -- Equal breath sounds bilaterally -- Breathing non-labored at rest Cardiovascular:  -- S1, S2 present  -- No pericardial rubs Gastrointestinal:  -- Abdomen soft, mild tender in the left lower quadrant, non-distended, no guarding or rebound tenderness -- No abdominal masses appreciated, pulsatile or otherwise  Musculoskeletal and Integumentary:  -- Wounds: None appreciated -- Extremities: B/L UE and LE FROM, hands and feet warm, no edema  Neurologic:  -- Motor function: intact and symmetric -- Sensation: intact and symmetric   Labs:  CBC Latest Ref Rng & Units 08/08/2021 05/27/2021 05/07/2021  WBC 4.0 - 10.5 K/uL 11.2(H) 9.5 19.2(H)  Hemoglobin 12.0 - 15.0 g/dL 11.9(L) 9.6(L) 9.0(L)  Hematocrit 36.0 - 46.0 % 36.2 30.0(L) 27.5(L)  Platelets 150 - 400 K/uL 319 382 471(H)   CMP Latest Ref Rng & Units 08/08/2021 05/27/2021 05/08/2021  Glucose 70 - 99 mg/dL 108(H) 132(H) 119(H)  BUN 8 - 23 mg/dL 11 6(L) 11  Creatinine 0.44 - 1.00 mg/dL 0.63 0.78 0.72  Sodium 135 - 145 mmol/L 131(L) 133(L) 133(L)  Potassium 3.5 - 5.1 mmol/L 4.4 2.9(L) 3.7  Chloride 98 - 111 mmol/L 94(L) 95(L) 97(L)  CO2 22 - 32 mmol/L '29 28 29  '$ Calcium 8.9 - 10.3 mg/dL 9.6 8.7(L) 8.5(L)  Total Protein 6.5 - 8.1 g/dL 7.9 - -  Total Bilirubin 0.3 - 1.2 mg/dL 0.6 - -  Alkaline Phos 38 - 126 U/L 106 - -  AST 15 - 41 U/L 21 - -  ALT 0 - 44 U/L 18 - -     Imaging studies:  EXAM: CT ABDOMEN AND PELVIS WITH CONTRAST   TECHNIQUE: Multidetector CT imaging of the abdomen and pelvis was performed using the standard protocol following bolus administration of intravenous contrast.   CONTRAST:  78m OMNIPAQUE IOHEXOL 350 MG/ML SOLN   COMPARISON:  05/02/2021   FINDINGS: Lower Chest: No acute findings. Previously seen pericardial effusion has resolved since prior exam.   Hepatobiliary: No hepatic masses identified. Two sub-cm cysts are noted in the left hepatic lobe. Gallbladder is unremarkable. No evidence of biliary ductal dilatation.   Pancreas:  No mass or inflammatory changes.   Spleen: There has been near complete resolution of subcapsular perisplenic hematoma since prior study. No evidence of splenic mass or splenomegaly.   Adrenals/Urinary Tract: No masses identified. No evidence of ureteral calculi or hydronephrosis.   Stomach/Bowel: Mild sigmoid diverticulitis is seen. A rim enhancing diverticular abscess is seen measuring 2.7 x 2.5 cm. No evidence of extraluminal air. Large amount of stool is seen throughout the colon.   Vascular/Lymphatic: No pathologically enlarged lymph nodes. No acute vascular findings. Aortic atherosclerotic calcification noted.   Reproductive:  No mass or other  significant abnormality.   Other:  None.   Musculoskeletal: No suspicious bone lesions identified. Moderate thoracolumbar scoliosis again noted.   IMPRESSION: Mild sigmoid diverticulitis, with 2.7 cm diverticular abscess.   Large stool burden noted; recommend clinical correlation for possible constipation.   Aortic Atherosclerosis (ICD10-I70.0).     Electronically Signed   By: JMarlaine HindM.D.   On: 08/08/2021 14:00  Assessment/Plan:  65y.o. female with acute diverticulitis with abscess without bleeding, complicated by pertinent comorbidities including anxiety, depression, hypertension, aortic stenosis.  Patient with a week history of abdominal pain.  CT scan confirmed diagnosis of small diverticular abscess of 2.7 cm.  No free air or free fluid.  Patient was oriented about CT scan finding and diagnosis.  I recommend to continue IV antibiotic therapy.  If pain does not get worse than what it is today patient can be started on clear liquid diet tomorrow.  At this moment we will try conservative management.  No urgent surgical management indication at this moment.  I will continue to follow along.    EArnold Long MD

## 2021-08-08 NOTE — H&P (Signed)
[place]   PATIENT NAME: Deborah Koch    MR#:  [id number]  DATE OF BIRTH:  May 27, 1956  DATE OF ADMISSION:  08/08/2021  PRIMARY CARE PHYSICIAN: Burnard Hawthorne, FNP   Patient is coming from: Home  REQUESTING/REFERRING PHYSICIAN: Conni Slipper, MD  CHIEF COMPLAINT:   Chief Complaint  Patient presents with  . Abdominal Pain    HISTORY OF PRESENT ILLNESS:  Deborah Koch is a 65 y.o. Caucasian female with medical history significant for anxiety, depression, hypertension, dyslipidemia, DDD, scoliosis, C. difficile colitis, severe aortic stenosis, and ascending aortic aneurysm, who presented to the ER with acute onset of severe lower abdominal pain that started last week on Monday with associated diarrhea initially followed by constipation.  Her pain has been stabbing and colicky lasting about a minute then resolving.  It has been waking her up in the middle of the night.  He has been worsening after eating.  She was initially having diarrhea before she got constipated.  She denies any nausea or vomiting.  No melena or bright red bleeding per rectum.  No fever or chills.  No chest pain or dyspnea or palpitations.  No dysuria, oliguria or hematuria or flank pain.  No cough or wheezing.  No other bleeding diathesis.  ED Course: When she came to the ER heart rate was 57 with otherwise normal vital signs.  Labs revealed hyponatremia of 131 hypochloremia of 94 with normal leukocytosis of 11.2 and mild anemia.  UA was negative.   Imaging: Abdominal and pelvic CT scan revealed the following: Mild sigmoid diverticulitis, with 2.7 cm diverticular abscess.   Large stool burden noted; recommend clinical correlation for possible constipation.   Aortic Atherosclerosis.  The patient was given IV Unasyn.  Contact was made with interventional radiology who thought that the abscess is small and very difficult to get through and would recommend just treatment with antibiotics and  observe for the time being.  The patient will be admitted to a medical bed for further evaluation and management PAST MEDICAL HISTORY:   Past Medical History:  Diagnosis Date  . Anxiety   . Ascending Aortic Aneurysm    a. 04/2021 s/p replacement of the ascending aorta and aortic arch with aorta-innominate and aorta-left carotid bypass with supra coronary anastomosis as well as AVR with 23 mm Edwards pericardial tissue valve.  . C. difficile colitis    a. 07/2014.  [name] Congenital bicuspid aortic valve   . Depression   . Diverticulitis    hospitalized; subsequent c diff infection.   [name] GERD (gastroesophageal reflux disease)   . H/O mastitis 2015  . Heart murmur   . History of cardiac catheterization    a. 03/2021 Nl cors.  . History of tobacco abuse    a. 20 yrs, 1/4 ppd - quit.  [name] HTN (hypertension)   . Hypokalemia   . Multilevel degenerative disc disease   . PAF (paroxysmal atrial fibrillation) ([place])    a. 04/2021-->amio. No OAC 2/2 concern for hemopericardium (post-op Ao arch/AVR).  . Pleural effusion, bilateral    a. 04/2021 s/p thoracic surgery.  . Pneumonia    at age 46  . Scoliosis   . Severe aortic stenosis w/ congenital bicuspid AoV    a. 04/2021 s/p 73m edwards pericardial tissue valve; b. 05/12/2021 Echo: EF 55-60%, no rwma, Gr2 DD, nl RV fxn, mild BAE, moderate pericardial effusion, mild MR, mild to mod TR, Nl fxn'ing AoV prosthesis.    PAST  SURGICAL HISTORY:   Past Surgical History:  Procedure Laterality Date  . AORTA -INNOMIATE BYPASS N/A 04/21/2021   Procedure: AORTA -[place] BYPASS;  Surgeon: Gaye Pollack, MD;  Location: [facility] OR;  Service: Vascular;  Laterality: N/A;  . AORTIC VALVE REPLACEMENT N/A 04/21/2021   Procedure: AORTIC VALVE REPLACEMENT (AVR) ON PUMP WITH 23MM INSPIRIS AORTIC VALVE;  Surgeon: Gaye Pollack, MD;  Location: [facility];  Service: Open Heart Surgery;  Laterality: N/A;  . BARTHOLIN GLAND CYST EXCISION    . BREAST BIOPSY Left    neg  .  CAROTID-SUBCLAVIAN BYPASS GRAFT Left 04/21/2021   Procedure: AORTA LEFT COMMON CAROTID BYPASS;  Surgeon: Gaye Pollack, MD;  Location: [place] OR;  Service: Vascular;  Laterality: Left;  . COLONOSCOPY  2013   Dr. Allen Norris  . COLONOSCOPY WITH PROPOFOL N/A 10/23/2017   Procedure: COLONOSCOPY WITH PROPOFOL;  Surgeon: Lucilla Lame, MD;  Location[facility] ENDOSCOPY;  Service: Endoscopy;  Laterality: N/A;  . OOPHORECTOMY  2016  . OVARY SURGERY  2016   cyst on right ovary  . REPLACEMENT ASCENDING AORTA N/A 04/21/2021   Procedure: REPLACEMENT ASCENDING & ARCH AORTA WITH HEMASHIELD PLATINUM GRAFT 28 [id number];  Surgeon: Gaye Pollack, MD;  Location: [place];  Service: Open Heart Surgery;  Laterality: N/A;  CIRC ARREST  RIGHT AXILLARY ARTERY CANNULATION  . RIGHT/LEFT HEART CATH AND CORONARY ANGIOGRAPHY N/A 03/24/2021   Procedure: RIGHT/LEFT HEART CATH AND CORONARY ANGIOGRAPHY;  Surgeon: Minna Merritts, MD;  Location: [facility] CV LAB;  Service: Cardiovascular;  Laterality: N/A;  . SALPINGECTOMY Bilateral   . TEE WITHOUT CARDIOVERSION N/A 04/21/2021   Procedure: TRANSESOPHAGEAL ECHOCARDIOGRAM (TEE);  Surgeon: Gaye Pollack, MD;  Location: [facility];  Service: Open Heart Surgery;  Laterality: N/A;    SOCIAL HISTORY:   Social History   Tobacco Use  . Smoking status: Light Smoker    Packs/day: 0.25    Years: 20.00    Pack years: 5.00    Types: Cigarettes  . Smokeless tobacco: Never  Substance Use Topics  . Alcohol use: Yes    Alcohol/week: 12.0 standard drinks    Types: 10 Glasses of wine, 2 Cans of beer per week    Comment:  2-3 glasses of wine every other evening    FAMILY HISTORY:   Family History  Problem Relation Age of Onset  . Hypertension Mother   . Colon cancer Mother   . Anxiety disorder Mother   . Depression Mother   . Heart failure Father   . Hypertension Father   . Depression Sister   . Anxiety disorder Sister   . Alcohol abuse Brother   . Schizophrenia Brother   . Breast  cancer Maternal Aunt   . Heart attack Neg Hx   . Stroke Neg Hx     DRUG ALLERGIES:   Allergies  Allergen Reactions  . Hydrocodone Hives and Rash    REVIEW OF SYSTEMS:   ROS As per history of present illness. All pertinent systems were reviewed above. Constitutional, HEENT, cardiovascular, respiratory, GI, GU, musculoskeletal, neuro, psychiatric, endocrine, integumentary and hematologic systems were reviewed and are otherwise negative/unremarkable except for positive findings mentioned above in the HPI.   MEDICATIONS AT HOME:   Prior to Admission medications   Medication Sig Start Date End Date Taking? Authorizing Provider  clonazePAM (KLONOPIN) 0.5 MG tablet Take 1 tablet (0.5 mg total) by mouth 3 (three) times daily as needed for anxiety. 05/20/21   Burnard Hawthorne, FNP  escitalopram (  LEXAPRO) 10 MG tablet TAKE 1 TABLET BY MOUTH EVERY DAY 06/21/21   Burnard Hawthorne, FNP  furosemide (LASIX) 40 MG tablet Take 1 tablet (40 mg total) by mouth daily. 05/20/21 06/19/21  Theora Gianotti, NP  losartan (COZAAR) 25 MG tablet Take 1 tablet (25 mg total) by mouth daily. 05/20/21 08/18/21  Theora Gianotti, NP  metoprolol tartrate (LOPRESSOR) 50 MG tablet Take 1 tablet (50 mg total) by mouth 2 (two) times daily. 05/20/21 06/19/21  Theora Gianotti, NP  ondansetron (ZOFRAN ODT) 4 MG disintegrating tablet Take 1 tablet (4 mg total) by mouth every 8 (eight) hours as needed for nausea or vomiting. 06/17/21   Burnard Hawthorne, FNP  potassium chloride (KLOR-CON) 10 MEQ tablet Take 1 tablet (10 mEq total) by mouth 2 (two) times daily. 07/11/21   Minna Merritts, MD  rosuvastatin (CRESTOR) 5 MG tablet Take 1 tablet (5 mg total) by mouth daily. 03/22/21 07/11/21  Marrianne Mood D, PA-C      VITAL SIGNS:  Blood pressure (!) 146/84, pulse 60, temperature 98.5 F (36.9 C), temperature source Oral, resp. rate 16, SpO2 98 %.  PHYSICAL EXAMINATION:  Physical Exam  GENERAL:  65  y.o.-year-old Caucasian female patient lying in the bed with no acute distress.  EYES: Pupils equal, round, reactive to light and accommodation. No scleral icterus. Extraocular muscles intact.  HEENT: Head atraumatic, normocephalic. Oropharynx and nasopharynx clear.  NECK:  Supple, no jugular venous distention. No thyroid enlargement, no tenderness.  LUNGS: Normal breath sounds bilaterally, no wheezing, rales,rhonchi or crepitation. No use of accessory muscles of respiration.  CARDIOVASCULAR: Regular rate and rhythm, S1, S2 normal. No murmurs, rubs, or gallops.  ABDOMEN: Soft, nondistended, with left lower quadrant tenderness without upon tenderness guarding or rigidity.  Bowel sounds present. No organomegaly or mass.  EXTREMITIES: No pedal edema, cyanosis, or clubbing.  NEUROLOGIC: Cranial nerves II through XII are intact. Muscle strength 5/5 in all extremities. Sensation intact. Gait not checked.  PSYCHIATRIC: The patient is alert and oriented x 3.  Normal affect and good eye contact. SKIN: No obvious rash, lesion, or ulcer.   LABORATORY PANEL:   CBC Recent Labs  Lab 08/08/21 1010  WBC 11.2*  HGB 11.9*  HCT 36.2  PLT 319   ------------------------------------------------------------------------------------------------------------------  Chemistries  Recent Labs  Lab 08/08/21 1010  NA 131*  K 4.4  CL 94*  CO2 29  GLUCOSE 108*  BUN 11  CREATININE 0.63  CALCIUM 9.6  AST 21  ALT 18  ALKPHOS 106  BILITOT 0.6   ------------------------------------------------------------------------------------------------------------------  Cardiac Enzymes No results for input(s): TROPONINI in the last 168 hours. ------------------------------------------------------------------------------------------------------------------  RADIOLOGY:  CT ABDOMEN PELVIS W CONTRAST  Result Date: 08/08/2021 CLINICAL DATA:  Severe lower abdominal pain for 1 week. Elevated lipase. Constipation. EXAM: CT  ABDOMEN AND PELVIS WITH CONTRAST TECHNIQUE: Multidetector CT imaging of the abdomen and pelvis was performed using the standard protocol following bolus administration of intravenous contrast. CONTRAST:  6m OMNIPAQUE IOHEXOL 350 MG/ML SOLN COMPARISON:  05/02/2021 FINDINGS: Lower Chest: No acute findings. Previously seen pericardial effusion has resolved since prior exam. Hepatobiliary: No hepatic masses identified. Two sub-cm cysts are noted in the left hepatic lobe. Gallbladder is unremarkable. No evidence of biliary ductal dilatation. Pancreas:  No mass or inflammatory changes. Spleen: There has been near complete resolution of subcapsular perisplenic hematoma since prior study. No evidence of splenic mass or splenomegaly. Adrenals/Urinary Tract: No masses identified. No evidence of ureteral calculi or hydronephrosis. Stomach/Bowel:  Mild sigmoid diverticulitis is seen. A rim enhancing diverticular abscess is seen measuring 2.7 x 2.5 cm. No evidence of extraluminal air. Large amount of stool is seen throughout the colon. Vascular/Lymphatic: No pathologically enlarged lymph nodes. No acute vascular findings. Aortic atherosclerotic calcification noted. Reproductive:  No mass or other significant abnormality. Other:  None. Musculoskeletal: No suspicious bone lesions identified. Moderate thoracolumbar scoliosis again noted. IMPRESSION: Mild sigmoid diverticulitis, with 2.7 cm diverticular abscess. Large stool burden noted; recommend clinical correlation for possible constipation. Aortic Atherosclerosis (ICD10-I70.0). Electronically Signed   By: Marlaine Hind M.D.   On: 08/08/2021 14:00      IMPRESSION AND PLAN:  Active Problems:   Acute diverticulitis  1.  Acute sigmoid diverticulitis with 2.7 cm diverticular abscess. - The patient will be admitted to a medical bed. - We will continue antibiotic therapy with IV Zosyn. - Pain management will be provided. - General surgery consultation will be obtained. -  Interventional radiology consult was obtained and recommendation was for conservative management.  2.  Constipation. - We will manage his constipation and follow.  3.  Essential hypertension. - Continue Cozaar and Lopressor.  4.  Anxiety and depression. - We will continue Klonopin and Lexapro.  5.  Dyslipidemia. - We will continue statin therapy.  DVT prophylaxis: Lovenox.  Code Status: full code.  Family Communication:  The plan of care was discussed in details with the patient (and family). I answered all questions. The patient agreed to proceed with the above mentioned plan. Further management will depend upon hospital course. Disposition Plan: Back to previous home environment Consults called: General surgery. All the records are reviewed and case discussed with ED provider.  Status is: Inpatient  Remains inpatient appropriate because:Ongoing active pain requiring inpatient pain management, Ongoing diagnostic testing needed not appropriate for outpatient work up, Unsafe d/c plan, IV treatments appropriate due to intensity of illness or inability to take PO, and Inpatient level of care appropriate due to severity of illness  Dispo: The patient is from: Home              Anticipated d/c is to: Home              Patient currently is not medically stable to d/c.   Difficult to place patient No  TOTAL TIME TAKING CARE OF THIS PATIENT: 55 minutes.    Christel Mormon M.D on 08/08/2021 at 2:19 PM  [facility]   From 7 PM-7 AM, contact night-coverage [email or web address]  CC: Primary care physician; Burnard Hawthorne, FNP

## 2021-08-08 NOTE — ED Triage Notes (Signed)
Pt come with c/o severe abdominal pain and constipation. Pt states this has been going on and off for about a week.

## 2021-08-08 NOTE — ED Notes (Signed)
Pt does not need COVID test resulted to go to floor, will send pt up after "protected time'

## 2021-08-08 NOTE — ED Notes (Signed)
Pt states that she takes her klonopin every day, 3 times a day.

## 2021-08-08 NOTE — Progress Notes (Signed)
Vascular and Interventional Radiology Brief Consult Review Note  Patient: Jakevia Dyal DOB: [birth year] Medical Record Number: [id number] Note Date/Time: 08/08/21 2:20 PM   Admitting Diagnosis: Acute diverticulitis [K57.92]  1. Lower abdominal pain   2. Diverticulitis   3. Abscess   4. Constipation, unspecified constipation type      Imaging: CT AP, 08/08/2021 personally reviewed showing small 2 cm diverticular abscess.     Assessment  Plan: 65 y.o. year old female whom ER MD reached out to [facility] Physician with concern for diverticular abscess. Evaluation for drain placement.  - Small (early) abscess, in a challenging location for percutaneous drainage at this time. - Defer on drain placement.  - Consider conservative management and re-imaging based on Pt symptoms.  Thank you for allowing [place] to participate in the care of your Patient. Please contact the [place] Team with questions, concerns, or if new issues arise.   Michaelle Birks, MD [facility]   Pager. [place]

## 2021-08-08 NOTE — Telephone Encounter (Signed)
Call patient Please check on her and see how she is doing.  Does she still  have abdominal pain or diarrhea?  If so she would need in person exam. If Ido not have any appointments then yes I would have to advise that she goes to urgent care today.  I would not delay

## 2021-08-09 ENCOUNTER — Telehealth: Payer: Self-pay | Admitting: Cardiovascular Disease

## 2021-08-09 ENCOUNTER — Encounter: Payer: Self-pay | Admitting: Family Medicine

## 2021-08-09 DIAGNOSIS — K5900 Constipation, unspecified: Secondary | ICD-10-CM | POA: Diagnosis not present

## 2021-08-09 DIAGNOSIS — R1030 Lower abdominal pain, unspecified: Secondary | ICD-10-CM

## 2021-08-09 DIAGNOSIS — L0291 Cutaneous abscess, unspecified: Secondary | ICD-10-CM | POA: Diagnosis not present

## 2021-08-09 DIAGNOSIS — E43 Unspecified severe protein-calorie malnutrition: Secondary | ICD-10-CM

## 2021-08-09 DIAGNOSIS — K5792 Diverticulitis of intestine, part unspecified, without perforation or abscess without bleeding: Secondary | ICD-10-CM | POA: Diagnosis not present

## 2021-08-09 LAB — CBC
HCT: 31.7 % — ABNORMAL LOW (ref 36.0–46.0)
Hemoglobin: 10.5 g/dL — ABNORMAL LOW (ref 12.0–15.0)
MCH: 27 pg (ref 26.0–34.0)
MCHC: 33.1 g/dL (ref 30.0–36.0)
MCV: 81.5 fL (ref 80.0–100.0)
Platelets: 276 10*3/uL (ref 150–400)
RBC: 3.89 MIL/uL (ref 3.87–5.11)
RDW: 17.9 % — ABNORMAL HIGH (ref 11.5–15.5)
WBC: 8 10*3/uL (ref 4.0–10.5)
nRBC: 0 % (ref 0.0–0.2)

## 2021-08-09 LAB — BASIC METABOLIC PANEL
Anion gap: 8 (ref 5–15)
BUN: 10 mg/dL (ref 8–23)
CO2: 27 mmol/L (ref 22–32)
Calcium: 9.2 mg/dL (ref 8.9–10.3)
Chloride: 102 mmol/L (ref 98–111)
Creatinine, Ser: 0.75 mg/dL (ref 0.44–1.00)
GFR, Estimated: >60 mL/min (ref >60)
Glucose, Bld: 72 mg/dL (ref 70–99)
Potassium: 3.9 mmol/L (ref 3.5–5.1)
Sodium: 137 mmol/L (ref 135–145)

## 2021-08-09 LAB — SARS CORONAVIRUS 2 (TAT 6-24 HRS): SARS Coronavirus 2: NEGATIVE

## 2021-08-09 MED ORDER — PROSOURCE PLUS PO LIQD
30.0000 mL | Freq: Three times a day (TID) | ORAL | Status: DC
  Administered 2021-08-09 – 2021-08-11 (×6): 30 mL via ORAL
  Filled 2021-08-09: qty 30

## 2021-08-09 MED ORDER — BOOST / RESOURCE BREEZE PO LIQD CUSTOM
1.0000 | Freq: Three times a day (TID) | ORAL | Status: DC
  Administered 2021-08-09 – 2021-08-11 (×6): 1 via ORAL

## 2021-08-09 MED ORDER — SORBITOL 70 % SOLN
400.0000 mL | TOPICAL_OIL | Freq: Once | ORAL | Status: AC
  Administered 2021-08-09: 400 mL via RECTAL
  Filled 2021-08-09: qty 100

## 2021-08-09 MED ORDER — FENTANYL CITRATE (PF) 100 MCG/2ML IJ SOLN
12.5000 ug | Freq: Once | INTRAMUSCULAR | Status: AC
  Administered 2021-08-09: 12.5 ug via INTRAVENOUS
  Filled 2021-08-09: qty 2

## 2021-08-09 MED ORDER — POLYETHYLENE GLYCOL 3350 17 G PO PACK
17.0000 g | PACK | Freq: Every day | ORAL | Status: DC
  Administered 2021-08-09 – 2021-08-11 (×3): 17 g via ORAL
  Filled 2021-08-09 (×3): qty 1

## 2021-08-09 MED ORDER — ENSURE ENLIVE PO LIQD
237.0000 mL | Freq: Three times a day (TID) | ORAL | Status: DC
  Administered 2021-08-09 – 2021-08-11 (×6): 237 mL via ORAL

## 2021-08-09 MED ORDER — BISACODYL 10 MG RE SUPP
10.0000 mg | Freq: Once | RECTAL | Status: AC
  Administered 2021-08-09: 10 mg via RECTAL
  Filled 2021-08-09: qty 1

## 2021-08-09 MED ORDER — ADULT MULTIVITAMIN W/MINERALS CH
1.0000 | ORAL_TABLET | Freq: Every day | ORAL | Status: DC
  Administered 2021-08-09 – 2021-08-11 (×3): 1 via ORAL
  Filled 2021-08-09 (×3): qty 1

## 2021-08-09 NOTE — Telephone Encounter (Signed)
Patient calling - currently in hospital States lab work was completed in hospital and that her potassium looks good Wanted Dr Rockey Situ to review

## 2021-08-09 NOTE — Telephone Encounter (Signed)
Reviewed the patient's chart.  She was seen by Dr. Rockey Situ on 07/11/21.  The patient has had issues with hypokalemia. She was instructed to stay on potassium 10 meq BID at that visit and have  a repeat BMP ~ 08/01/21.  She is currently in the hospital and a BMP has been repeated during this hospitalization.  08/09/21: K+- 3.9 08/08/21: K+- 4.4 05/27/21: K+ 2.9  To Dr. Rockey Situ to review.

## 2021-08-09 NOTE — Progress Notes (Signed)
Patient ID: Deborah Koch, female   DOB: 04-03-1956, 65 y.o.   MRN: [id number]     [place] Hospital Day(s): 1.   Interval History: Patient seen and examined, no acute events or new complaints overnight. Patient reports feeling better this afternoon.  Pain decreased to a 2 out of 10.  No nausea or vomiting.  Patient actually drank an Ensure and a Prosource liquid 30 mL without any nausea or worsening pain.  Vital signs in last 24 hours: [min-max] current  Temp:  [98.2 F (36.8 C)-99.3 F (37.4 C)] 99.3 F (37.4 C) (08/23 0729) Pulse Rate:  [27-62] 57 (08/23 0729) Resp:  [10-16] 16 (08/23 0729) BP: (113-146)/(62-92) 115/63 (08/23 0729) SpO2:  [93 %-100 %] 96 % (08/23 0729) Weight:  [53.7 kg] 53.7 kg (08/22 1950)     Height: '5\' 3"'$  (160 cm) Weight: 53.7 kg     Physical Exam:  Constitutional: alert, cooperative and no distress  Respiratory: breathing non-labored at rest  Cardiovascular: regular rate and sinus rhythm  Gastrointestinal: soft, mild-tender in the left lower quadrant. non-distended  Labs:  CBC Latest Ref Rng & Units 08/09/2021 08/08/2021 05/27/2021  WBC 4.0 - 10.5 K/uL 8.0 11.2(H) 9.5  Hemoglobin 12.0 - 15.0 g/dL 10.5(L) 11.9(L) 9.6(L)  Hematocrit 36.0 - 46.0 % 31.7(L) 36.2 30.0(L)  Platelets 150 - 400 K/uL 276 319 382   CMP Latest Ref Rng & Units 08/09/2021 08/08/2021 05/27/2021  Glucose 70 - 99 mg/dL 72 108(H) 132(H)  BUN 8 - 23 mg/dL 10 11 6(L)  Creatinine 0.44 - 1.00 mg/dL 0.75 0.63 0.78  Sodium 135 - 145 mmol/L 137 131(L) 133(L)  Potassium 3.5 - 5.1 mmol/L 3.9 4.4 2.9(L)  Chloride 98 - 111 mmol/L 102 94(L) 95(L)  CO2 22 - 32 mmol/L '27 29 28  '$ Calcium 8.9 - 10.3 mg/dL 9.2 9.6 8.7(L)  Total Protein 6.5 - 8.1 g/dL - 7.9 -  Total Bilirubin 0.3 - 1.2 mg/dL - 0.6 -  Alkaline Phos 38 - 126 U/L - 106 -  AST 15 - 41 U/L - 21 -  ALT 0 - 44 U/L - 18 -    Imaging studies: No new pertinent imaging studies   Assessment/Plan:  65 y.o. female with acute  diverticulitis with abscess without bleeding, complicated by pertinent comorbidities including anxiety, depression, hypertension, aortic stenosis.  Patient today with improved pain.  White blood cell count also improved to normal levels.  Patient already drank an Ensure and Prosource which actually I think that this is considered more than her liquid diet even though she was n.p.o.  Anyways I will start her on clear liquids.  She can be advanced as tolerated.  Continue antibiotic therapy.  We will continue to follow along.  Arnold Long, MD

## 2021-08-09 NOTE — Telephone Encounter (Signed)
Placed call to pt. Pt has been admitted to the hospital for symptoms.

## 2021-08-09 NOTE — Progress Notes (Addendum)
PROGRESS NOTE    Deborah Koch  [id number] DOB: 05-20-1956 DOA: 08/08/2021 PCP: Burnard Hawthorne, FNP   Brief Narrative: Taken from H&P. Deborah Koch is a 65 y.o. Caucasian female with medical history significant for anxiety, depression, hypertension, dyslipidemia, DDD, scoliosis, C. difficile colitis, severe aortic stenosis, and ascending aortic aneurysm, who presented to the ER with acute onset of severe lower abdominal pain that started last week on Monday with associated diarrhea initially followed by constipation.  Her pain has been stabbing and colicky lasting about a minute then resolving.  It has been waking her up in the middle of the night.  He has been worsening after eating.   On arrival she had mild leukocytosis.  Abdominal CT with mild sigmoid diverticulitis and a concerning 2.7 cm diverticular abscess.  Large stool burden was noted. General surgery was consulted and they are recommending conservative management. Initially given Unasyn and later started on Zosyn.  Subjective: Patient was having some mild lower abdominal pain, no nausea or vomiting.  Did not had any bowel movement for the past 5 days.  No fever or chills.  Assessment & Plan:   Active Problems:   Acute diverticulitis   Protein-calorie malnutrition, severe  Acute sigmoid diverticulitis with 2.7 cm diverticular abscess.  General surgery was consulted and they are recommending conservative management.  No need for any surgical intervention at this time.  Patient was started on clear liquid diet with instructions to advance as tolerated. IR also recommending conservative management. -Continue with Zosyn. -Advance diet as tolerated  Constipation.  We will try Dulcolax suppository today. -Continue with bowel regimen. -Can add enema if needed  Essential hypertension. - Continue Cozaar and Lopressor.  Anxiety and depression. - Continue Klonopin and Lexapro.  Dyslipidemia. - Continue  statin therapy.  Moderate protein caloric malnutrition. -Dietitian consult  Objective: Vitals:   08/08/21 2011 08/08/21 2341 08/09/21 0431 08/09/21 0729  BP: (!) 145/86 113/62 134/81 115/63  Pulse: (!) 54 (!) 50 (!) 54 (!) 57  Resp: '16  16 16  '$ Temp: 98.2 F (36.8 C)  98.3 F (36.8 C) 99.3 F (37.4 C)  TempSrc: Oral  Oral Oral  SpO2: 100%  95% 96%  Weight:      Height:  '5\' 3"'$  (1.6 m)      Intake/Output Summary (Last 24 hours) at 08/09/2021 1613 Last data filed at 08/09/2021 0304 Gross per 24 hour  Intake 1056.77 ml  Output --  Net 1056.77 ml   Filed Weights   08/08/21 1950  Weight: 53.7 kg    Examination:  General exam: Appears calm and comfortable  Respiratory system: Clear to auscultation. Respiratory effort normal. Cardiovascular system: S1 & S2 heard, RRR. No JVD, murmurs, rubs, gallops or clicks. Gastrointestinal system: Soft, nontender, nondistended, bowel sounds positive. Central nervous system: Alert and oriented. No focal neurological deficits. Extremities: No edema, no cyanosis, pulses intact and symmetrical. Psychiatry: Judgement and insight appear normal.   DVT prophylaxis: Lovenox Code Status: Full Family Communication: Discussed with patient Disposition Plan:  Status is: Inpatient  Remains inpatient appropriate because:Inpatient level of care appropriate due to severity of illness  Dispo: The patient is from: Home              Anticipated d/c is to: Home              Patient currently is not medically stable to d/c.   Difficult to place patient No  Level of care: Med-Surg  All the records are reviewed and case discussed with Care Management/Social Worker. Management plans discussed with the patient, nursing and they are in agreement.  Consultants:  Gastroenterology  Procedures:  Antimicrobials:  Zosyn  Data Reviewed: I have personally reviewed following labs and imaging studies  CBC: Recent Labs  Lab 08/08/21 1010  08/09/21 0700  WBC 11.2* 8.0  HGB 11.9* 10.5*  HCT 36.2 31.7*  MCV 82.3 81.5  PLT 319 [id number]   Basic Metabolic Panel: Recent Labs  Lab 08/08/21 1010 08/09/21 0700  NA 131* 137  K 4.4 3.9  CL 94* 102  CO2 29 27  GLUCOSE 108* 72  BUN 11 10  CREATININE 0.63 0.75  CALCIUM 9.6 9.2   GFR: Estimated Creatinine Clearance: 57.2 mL/min (by C-G formula based on SCr of 0.75 mg/dL). Liver Function Tests: Recent Labs  Lab 08/08/21 1010  AST 21  ALT 18  ALKPHOS 106  BILITOT 0.6  PROT 7.9  ALBUMIN 4.0   Recent Labs  Lab 08/08/21 1010  LIPASE 123*   No results for input(s): AMMONIA in the last 168 hours. Coagulation Profile: No results for input(s): INR, PROTIME in the last 168 hours. Cardiac Enzymes: No results for input(s): CKTOTAL, CKMB, CKMBINDEX, TROPONINI in the last 168 hours. BNP (last 3 results) No results for input(s): PROBNP in the last 8760 hours. HbA1C: No results for input(s): HGBA1C in the last 72 hours. CBG: No results for input(s): GLUCAP in the last 168 hours. Lipid Profile: No results for input(s): CHOL, HDL, LDLCALC, TRIG, CHOLHDL, LDLDIRECT in the last 72 hours. Thyroid Function Tests: No results for input(s): TSH, T4TOTAL, FREET4, T3FREE, THYROIDAB in the last 72 hours. Anemia Panel: No results for input(s): VITAMINB12, FOLATE, FERRITIN, TIBC, IRON, RETICCTPCT in the last 72 hours. Sepsis Labs: No results for input(s): PROCALCITON, LATICACIDVEN in the last 168 hours.  Recent Results (from the past 240 hour(s))  SARS CORONAVIRUS 2 (TAT 6-24 HRS) Nasopharyngeal Nasopharyngeal Swab     Status: None   Collection Time: 08/08/21  6:10 PM   Specimen: Nasopharyngeal Swab  Result Value Ref Range Status   SARS Coronavirus 2 NEGATIVE NEGATIVE Final    Comment: (NOTE) SARS-CoV-2 target nucleic acids are NOT DETECTED.  The SARS-CoV-2 RNA is generally detectable in upper and lower respiratory specimens during the acute phase of infection. Negative results do  not preclude SARS-CoV-2 infection, do not rule out co-infections with other pathogens, and should not be used as the sole basis for treatment or other patient management decisions. Negative results must be combined with clinical observations, patient history, and epidemiological information. The expected result is Negative.  Fact Sheet for Patients: SugarRoll.be  Fact Sheet for Healthcare Providers: https://www.woods-mathews.com/  This test is not yet approved or cleared by the [place] FDA and  has been authorized for detection and/or diagnosis of SARS-CoV-2 by FDA under an Emergency Use Authorization (EUA). This EUA will remain  in effect (meaning this test can be used) for the duration of the COVID-19 declaration under Se ction 564(b)(1) of the Act, 21 U.S.C. section 360bbb-3(b)(1), unless the authorization is terminated or revoked sooner.  Performed at [facility], [place] [address], [place], [place] [address]      Radiology Studies: CT ABDOMEN PELVIS W CONTRAST  Result Date: 08/08/2021 CLINICAL DATA:  Severe lower abdominal pain for 1 week. Elevated lipase. Constipation. EXAM: CT ABDOMEN AND PELVIS WITH CONTRAST TECHNIQUE: Multidetector CT imaging of the abdomen and pelvis was performed  using the standard protocol following bolus administration of intravenous contrast. CONTRAST:  69m OMNIPAQUE IOHEXOL 350 MG/ML SOLN COMPARISON:  05/02/2021 FINDINGS: Lower Chest: No acute findings. Previously seen pericardial effusion has resolved since prior exam. Hepatobiliary: No hepatic masses identified. Two sub-cm cysts are noted in the left hepatic lobe. Gallbladder is unremarkable. No evidence of biliary ductal dilatation. Pancreas:  No mass or inflammatory changes. Spleen: There has been near complete resolution of subcapsular perisplenic hematoma since prior study. No evidence of splenic mass or splenomegaly. Adrenals/Urinary Tract: No  masses identified. No evidence of ureteral calculi or hydronephrosis. Stomach/Bowel: Mild sigmoid diverticulitis is seen. A rim enhancing diverticular abscess is seen measuring 2.7 x 2.5 cm. No evidence of extraluminal air. Large amount of stool is seen throughout the colon. Vascular/Lymphatic: No pathologically enlarged lymph nodes. No acute vascular findings. Aortic atherosclerotic calcification noted. Reproductive:  No mass or other significant abnormality. Other:  None. Musculoskeletal: No suspicious bone lesions identified. Moderate thoracolumbar scoliosis again noted. IMPRESSION: Mild sigmoid diverticulitis, with 2.7 cm diverticular abscess. Large stool burden noted; recommend clinical correlation for possible constipation. Aortic Atherosclerosis (ICD10-I70.0). Electronically Signed   By: JMarlaine HindM.D.   On: 08/08/2021 14:00    Scheduled Meds:  (feeding supplement) PROSource Plus  30 mL Oral TID BM   enoxaparin (LOVENOX) injection  40 mg Subcutaneous Q24H   escitalopram  10 mg Oral Daily   feeding supplement  1 Container Oral TID BM   feeding supplement  237 mL Oral TID BM   furosemide  40 mg Oral Daily   losartan  25 mg Oral Daily   metoprolol tartrate  50 mg Oral BID   multivitamin with minerals  1 tablet Oral Daily   polyethylene glycol  17 g Oral Daily   potassium chloride  10 mEq Oral BID   rosuvastatin  5 mg Oral Daily   Continuous Infusions:  sodium chloride 100 mL/hr at 08/09/21 1511   piperacillin-tazobactam 3.375 g (08/09/21 1142)     LOS: 1 day   Time spent: 38 minutes. More than 50% of the time was spent in counseling/coordination of care  SLorella Nimrod MD [facility]  If 7PM-7AM, please contact night-coverage [email or web address]  08/09/2021, 4:13 PM   This record has been created using DSystem[employer] analyst Errors have been sought and corrected,but may not always be located. Such creation errors do not reflect on the standard of care.

## 2021-08-09 NOTE — Telephone Encounter (Signed)
Left voicemail message to call back for review.

## 2021-08-09 NOTE — Progress Notes (Signed)
Initial Nutrition Assessment  DOCUMENTATION CODES:  Severe malnutrition in context of acute illness/injury, Severe malnutrition in context of social or environmental circumstances  INTERVENTION:  Advance diet as tolerated and as medically able.  Add Boost Breeze po TID, each supplement provides 250 kcal and 9 grams of protein.  Add 30 ml ProSource Plus po TID, each supplement provides 100 kcal and 15 grams of protein.    Add MVI with minerals daily.  Once diet advances to full liquids, add: Ensure Plus po TID, each supplement provides 350 kcal and 13 grams of protein.  Magic cup TID with meals, each supplement provides 290 kcal and 9 grams of protein.  NUTRITION DIAGNOSIS:  Severe Malnutrition related to acute illness, social / environmental circumstances (Diverticular abscess; depression/personal stress) as evidenced by percent weight loss, mild fat depletion, moderate fat depletion, moderate muscle depletion, severe muscle depletion, energy intake < or equal to 50% for > or equal to 1 month, energy intake < or equal to 50% for > or equal to 5 days.  GOAL:  Patient will meet greater than or equal to 90% of their needs  MONITOR:  Diet advancement, PO intake, Supplement acceptance, Labs, Weight trends, I & O's  REASON FOR ASSESSMENT:  Malnutrition Screening Tool    ASSESSMENT:  65 yo female with a PMH of anxiety, depression, hypertension, dyslipidemia, DDD, scoliosis, C. difficile colitis, severe aortic stenosis, and ascending aortic aneurysm, who presented to the ER with acute onset of severe lower abdominal pain that started last week on Monday with associated diarrhea initially followed by constipation. It has been worsening after eating. She was initially having diarrhea before she got constipated. Admitted with acute sigmoid diverticulitis with 2.7 cm diverticular abscess.  Spoke with pt at bedside. Pt reports that since her heart surgery in May, she has found it difficult to  maintain a consistent schedule with eating. She also reports that she has gone through a divorce and has had to move in with her sister, so she has been under significant personal stress, so her appetite has been decreased due to this as well. Patient reports taking Ensure at home sometimes as well.  Pt endorses weight loss, about 15-20 lbs. Per Epic, pt has lost 19 lbs (14%) in the last almost 3 months, which is significant and severe for the time frame.  On exam, pt with moderate depletions throughout most of her body. Pt with severe depletions in legs.  Recommend adding Ensure Plus TID and Magic Cup TID to promote intake, as well as MVI with minerals daily.  Medications: reviewed; Lasix, miralax, Klor-Con 10 mEq, NaCl @ 100 ml/hr via IV, morphine PRN (given once today)  Labs: reviewed  NUTRITION - FOCUSED PHYSICAL EXAM: Flowsheet Row Most Recent Value  Orbital Region Moderate depletion  Upper Arm Region Moderate depletion  Thoracic and Lumbar Region Mild depletion  Buccal Region Moderate depletion  Temple Region Moderate depletion  Clavicle Bone Region Moderate depletion  Clavicle and Acromion Bone Region Moderate depletion  Scapular Bone Region Unable to assess  Dorsal Hand Severe depletion  Patellar Region Severe depletion  Anterior Thigh Region Severe depletion  Posterior Calf Region Severe depletion  Edema (RD Assessment) None  Hair Reviewed  Eyes Reviewed  Mouth Reviewed  Skin Reviewed  Nails Reviewed   Diet Order:   Diet Order             Diet NPO time specified Except for: Sips with Meds  Diet effective now  EDUCATION NEEDS:  Education needs have been addressed  Skin:  Skin Assessment: Reviewed RN Assessment  Last BM:  unknown  Height:  Ht Readings from Last 1 Encounters:  08/08/21 '5\' 3"'$  (1.6 m)   Weight:  Wt Readings from Last 1 Encounters:  08/08/21 53.7 kg   BMI:  Body mass index is 20.97 kg/m.  Estimated Nutritional Needs:   Kcal:  1900-2100 Protein:  70-85 grams Fluid:  >1.9 L  Derrel Nip, RD, LDN (she/her/hers) Registered Dietitian I After-Hours/Weekend Pager # in [place]

## 2021-08-10 ENCOUNTER — Other Ambulatory Visit: Payer: Self-pay | Admitting: Family

## 2021-08-10 DIAGNOSIS — K5792 Diverticulitis of intestine, part unspecified, without perforation or abscess without bleeding: Secondary | ICD-10-CM | POA: Diagnosis not present

## 2021-08-10 DIAGNOSIS — R1030 Lower abdominal pain, unspecified: Secondary | ICD-10-CM | POA: Diagnosis not present

## 2021-08-10 DIAGNOSIS — F32A Depression, unspecified: Secondary | ICD-10-CM

## 2021-08-10 DIAGNOSIS — K5900 Constipation, unspecified: Secondary | ICD-10-CM | POA: Diagnosis not present

## 2021-08-10 DIAGNOSIS — F419 Anxiety disorder, unspecified: Secondary | ICD-10-CM

## 2021-08-10 DIAGNOSIS — L0291 Cutaneous abscess, unspecified: Secondary | ICD-10-CM | POA: Diagnosis not present

## 2021-08-10 MED ORDER — BISACODYL 10 MG RE SUPP: 10.0000 mg | Freq: Every day | RECTAL | Status: DC | PRN

## 2021-08-10 MED ORDER — LACTULOSE 10 GM/15ML PO SOLN
20.0000 g | Freq: Once | ORAL | Status: AC
  Administered 2021-08-10: 20 g via ORAL
  Filled 2021-08-10: qty 30

## 2021-08-10 NOTE — Progress Notes (Signed)
Patient ID: Deborah Koch, female   DOB: [birth year], 65 y.o.   MRN: [id number]     [place] [facility] Day(s): 2.   Interval History: Patient seen and examined, no acute events or new complaints overnight. Patient reports feeling better.  She endorses she had a large bowel movement after enema.  She endorses she ate [place] without any abdominal pain.  No pain radiation.  No alleviating or aggravating factors.  Vital signs in last 24 hours: [min-max] current  Temp:  [97.5 F (36.4 C)-98.7 F (37.1 C)] 97.5 F (36.4 C) (08/24 1558) Pulse Rate:  [55-67] 59 (08/24 1558) Resp:  [16-18] 18 (08/24 1558) BP: (111-151)/(67-89) 111/67 (08/24 1558) SpO2:  [95 %-96 %] 95 % (08/24 1558)     Height: '5\' 3"'$  (160 cm) Weight: 53.7 kg     Physical Exam:  Constitutional: alert, cooperative and no distress  Respiratory: breathing non-labored at rest  Cardiovascular: regular rate and sinus rhythm  Gastrointestinal: soft, non-tender, and non-distended  Labs:  CBC Latest Ref Rng & Units 08/09/2021 08/08/2021 05/27/2021  WBC 4.0 - 10.5 K/uL 8.0 11.2(H) 9.5  Hemoglobin 12.0 - 15.0 g/dL 10.5(L) 11.9(L) 9.6(L)  Hematocrit 36.0 - 46.0 % 31.7(L) 36.2 30.0(L)  Platelets 150 - 400 K/uL 276 319 382   CMP Latest Ref Rng & Units 08/09/2021 08/08/2021 05/27/2021  Glucose 70 - 99 mg/dL 72 108(H) 132(H)  BUN 8 - 23 mg/dL 10 11 6(L)  Creatinine 0.44 - 1.00 mg/dL 0.75 0.63 0.78  Sodium 135 - 145 mmol/L 137 131(L) 133(L)  Potassium 3.5 - 5.1 mmol/L 3.9 4.4 2.9(L)  Chloride 98 - 111 mmol/L 102 94(L) 95(L)  CO2 22 - 32 mmol/L '27 29 28  '$ Calcium 8.9 - 10.3 mg/dL 9.2 9.6 8.7(L)  Total Protein 6.5 - 8.1 g/dL - 7.9 -  Total Bilirubin 0.3 - 1.2 mg/dL - 0.6 -  Alkaline Phos 38 - 126 U/L - 106 -  AST 15 - 41 U/L - 21 -  ALT 0 - 44 U/L - 18 -    Imaging studies: No new pertinent imaging studies   Assessment/Plan:  65 y.o. female with acute diverticulitis with abscess without bleeding, complicated by  pertinent comorbidities including anxiety, depression, hypertension, aortic stenosis.  Patient responding well to conservative management.  Patient tolerating soft diet.  Patient without leukocytosis.  Patient without fever.  Patient with pain under control.  From a standpoint of the indication for discharge with oral antibiotic therapy.  No surgical management indicated.  Arnold Long, MD

## 2021-08-10 NOTE — Plan of Care (Signed)

## 2021-08-10 NOTE — Progress Notes (Signed)
PROGRESS NOTE    Deborah Koch  [id number] DOB: 07/26/1956 DOA: 08/08/2021 PCP: Burnard Hawthorne, FNP   Brief Narrative: Taken from H&P. Deborah Koch is a 65 y.o. Caucasian female with medical history significant for anxiety, depression, hypertension, dyslipidemia, DDD, scoliosis, C. difficile colitis, severe aortic stenosis, and ascending aortic aneurysm, who presented to the ER with acute onset of severe lower abdominal pain that started last week on Monday with associated diarrhea initially followed by constipation.  Her pain has been stabbing and colicky lasting about a minute then resolving.  It has been waking her up in the middle of the night.  He has been worsening after eating.   On arrival she had mild leukocytosis.  Abdominal CT with mild sigmoid diverticulitis and a concerning 2.7 cm diverticular abscess.  Large stool burden was noted. General surgery was consulted and they are recommending conservative management. Initially given Unasyn and later started on Zosyn. Seems improving and if remains stable can be discharged on Cipro and Flagyl to complete a total of 14 days course.  Subjective: Patient still having some LLQ pain, although improving.  No nausea or vomiting.  Able to tolerate diet. Had a good bowel movement overnight.  Feeling quite relieved.  Assessment & Plan:   Active Problems:   Lower abdominal pain   Diverticulitis   Protein-calorie malnutrition, severe   Abscess   Constipation  Acute sigmoid diverticulitis with 2.7 cm diverticular abscess.  General surgery was consulted and they are recommending conservative management.  No need for any surgical intervention at this time.  Patient was started on clear liquid diet with instructions to advance as tolerated, currently tolerating soft diet well. IR also recommending conservative management. -Continue with Zosyn-we will discharge on ciprofloxacin and Flagyl to complete a total of 14-day  course. -Advance diet as tolerated  Constipation.  Resolved.  Patient had a good bowel movement overnight with suppository. -Continue with bowel regimen.  Essential hypertension. - Continue Cozaar, home Lasix and Lopressor.  Anxiety and depression. - Continue Klonopin and Lexapro.  Dyslipidemia. - Continue statin therapy.  Moderate protein caloric malnutrition. -Dietitian consult  Objective: Vitals:   08/09/21 1911 08/10/21 0534 08/10/21 0819 08/10/21 0820  BP: 121/73 (!) 151/89 135/73 135/73  Pulse: 67 (!) 55 (!) 55 (!) 57  Resp: '16 18  18  '$ Temp: 98.7 F (37.1 C) 98.4 F (36.9 C)  97.8 F (36.6 C)  TempSrc: Oral   Oral  SpO2: 96% 95%  96%  Weight:      Height:        Intake/Output Summary (Last 24 hours) at 08/10/2021 1508 Last data filed at 08/10/2021 1437 Gross per 24 hour  Intake 3661.66 ml  Output --  Net 3661.66 ml    Filed Weights   08/08/21 1950  Weight: 53.7 kg    Examination:  General.  Frail lady, in no acute distress. Pulmonary.  Lungs clear bilaterally, normal respiratory effort. CV.  Regular rate and rhythm, no JVD, rub or murmur. Abdomen.  Soft, nontender, nondistended, BS positive. CNS.  Alert and oriented .  No focal neurologic deficit. Extremities.  No edema, no cyanosis, pulses intact and symmetrical. Psychiatry.  Judgment and insight appears normal.   DVT prophylaxis: Lovenox Code Status: Full Family Communication: Discussed with patient. Disposition Plan:  Status is: Inpatient  Remains inpatient appropriate because:Inpatient level of care appropriate due to severity of illness  Dispo: The patient is from: Home  Anticipated d/c is to: Home              Patient currently is not medically stable to d/c.   Difficult to place patient No              Level of care: Med-Surg  All the records are reviewed and case discussed with Care Management/Social Worker. Management plans discussed with the patient, nursing and they  are in agreement.  Consultants:  Gastroenterology  Procedures:  Antimicrobials:  Zosyn  Data Reviewed: I have personally reviewed following labs and imaging studies  CBC: Recent Labs  Lab 08/08/21 1010 08/09/21 0700  WBC 11.2* 8.0  HGB 11.9* 10.5*  HCT 36.2 31.7*  MCV 82.3 81.5  PLT 319 [id number]    Basic Metabolic Panel: Recent Labs  Lab 08/08/21 1010 08/09/21 0700  NA 131* 137  K 4.4 3.9  CL 94* 102  CO2 29 27  GLUCOSE 108* 72  BUN 11 10  CREATININE 0.63 0.75  CALCIUM 9.6 9.2    GFR: Estimated Creatinine Clearance: 57.2 mL/min (by C-G formula based on SCr of 0.75 mg/dL). Liver Function Tests: Recent Labs  Lab 08/08/21 1010  AST 21  ALT 18  ALKPHOS 106  BILITOT 0.6  PROT 7.9  ALBUMIN 4.0    Recent Labs  Lab 08/08/21 1010  LIPASE 123*    No results for input(s): AMMONIA in the last 168 hours. Coagulation Profile: No results for input(s): INR, PROTIME in the last 168 hours. Cardiac Enzymes: No results for input(s): CKTOTAL, CKMB, CKMBINDEX, TROPONINI in the last 168 hours. BNP (last 3 results) No results for input(s): PROBNP in the last 8760 hours. HbA1C: No results for input(s): HGBA1C in the last 72 hours. CBG: No results for input(s): GLUCAP in the last 168 hours. Lipid Profile: No results for input(s): CHOL, HDL, LDLCALC, TRIG, CHOLHDL, LDLDIRECT in the last 72 hours. Thyroid Function Tests: No results for input(s): TSH, T4TOTAL, FREET4, T3FREE, THYROIDAB in the last 72 hours. Anemia Panel: No results for input(s): VITAMINB12, FOLATE, FERRITIN, TIBC, IRON, RETICCTPCT in the last 72 hours. Sepsis Labs: No results for input(s): PROCALCITON, LATICACIDVEN in the last 168 hours.  Recent Results (from the past 240 hour(s))  SARS CORONAVIRUS 2 (TAT 6-24 HRS) Nasopharyngeal Nasopharyngeal Swab     Status: None   Collection Time: 08/08/21  6:10 PM   Specimen: Nasopharyngeal Swab  Result Value Ref Range Status   SARS Coronavirus 2 NEGATIVE  NEGATIVE Final    Comment: (NOTE) SARS-CoV-2 target nucleic acids are NOT DETECTED.  The SARS-CoV-2 RNA is generally detectable in upper and lower respiratory specimens during the acute phase of infection. Negative results do not preclude SARS-CoV-2 infection, do not rule out co-infections with other pathogens, and should not be used as the sole basis for treatment or other patient management decisions. Negative results must be combined with clinical observations, patient history, and epidemiological information. The expected result is Negative.  Fact Sheet for Patients: SugarRoll.be  Fact Sheet for Healthcare Providers: https://www.woods-mathews.com/  This test is not yet approved or cleared by the [place] FDA and  has been authorized for detection and/or diagnosis of SARS-CoV-2 by FDA under an Emergency Use Authorization (EUA). This EUA will remain  in effect (meaning this test can be used) for the duration of the COVID-19 declaration under Se ction 564(b)(1) of the Act, 21 U.S.C. section 360bbb-3(b)(1), unless the authorization is terminated or revoked sooner.  Performed at [facility], [place] [address]  [place], [place] [address]       Radiology Studies: No results found.  Scheduled Meds:  (feeding supplement) PROSource Plus  30 mL Oral TID BM   enoxaparin (LOVENOX) injection  40 mg Subcutaneous Q24H   escitalopram  10 mg Oral Daily   feeding supplement  1 Container Oral TID BM   feeding supplement  237 mL Oral TID BM   furosemide  40 mg Oral Daily   losartan  25 mg Oral Daily   metoprolol tartrate  50 mg Oral BID   multivitamin with minerals  1 tablet Oral Daily   polyethylene glycol  17 g Oral Daily   potassium chloride  10 mEq Oral BID   rosuvastatin  5 mg Oral Daily   Continuous Infusions:  sodium chloride Stopped (08/10/21 0948)   piperacillin-tazobactam 3.375 g (08/10/21 1213)     LOS: 2 days   Time  spent: 36 minutes. More than 50% of the time was spent in counseling/coordination of care  Lorella Nimrod, MD [facility]  If 7PM-7AM, please contact night-coverage [email or web address]  08/10/2021, 3:08 PM   This record has been created using Systems analyst. Errors have been sought and corrected,but may not always be located. Such creation errors do not reflect on the standard of care.

## 2021-08-11 ENCOUNTER — Telehealth: Payer: Self-pay | Admitting: Family

## 2021-08-11 ENCOUNTER — Other Ambulatory Visit: Payer: Self-pay | Admitting: Internal Medicine

## 2021-08-11 DIAGNOSIS — K5792 Diverticulitis of intestine, part unspecified, without perforation or abscess without bleeding: Secondary | ICD-10-CM | POA: Diagnosis not present

## 2021-08-11 DIAGNOSIS — E43 Unspecified severe protein-calorie malnutrition: Secondary | ICD-10-CM | POA: Diagnosis not present

## 2021-08-11 DIAGNOSIS — L0291 Cutaneous abscess, unspecified: Secondary | ICD-10-CM | POA: Diagnosis not present

## 2021-08-11 DIAGNOSIS — R1030 Lower abdominal pain, unspecified: Secondary | ICD-10-CM | POA: Diagnosis not present

## 2021-08-11 MED ORDER — POLYETHYLENE GLYCOL 3350 17 G PO PACK: 17.0000 g | PACK | Freq: Every day | ORAL | 0 refills | Status: DC

## 2021-08-11 MED ORDER — METRONIDAZOLE 500 MG PO TABS: 500.0000 mg | ORAL_TABLET | Freq: Three times a day (TID) | ORAL | 0 refills | Status: AC

## 2021-08-11 MED ORDER — CIPROFLOXACIN HCL 500 MG PO TABS: 500.0000 mg | ORAL_TABLET | Freq: Two times a day (BID) | ORAL | 0 refills | Status: AC

## 2021-08-11 MED ORDER — PROSOURCE PLUS PO LIQD: 30.0000 mL | Freq: Three times a day (TID) | ORAL | 0 refills | Status: DC

## 2021-08-11 MED ORDER — ENSURE ENLIVE PO LIQD: 237.0000 mL | Freq: Three times a day (TID) | ORAL | 12 refills | Status: DC

## 2021-08-11 MED ORDER — ADULT MULTIVITAMIN W/MINERALS CH: 1.0000 | ORAL_TABLET | Freq: Every day | ORAL | 0 refills | Status: DC

## 2021-08-11 NOTE — Discharge Summary (Signed)
Physician Discharge Summary  [name] [id number] DOB: 1956/10/05 DOA: 08/08/2021  PCP: Burnard Hawthorne, FNP  Admit date: 08/08/2021 Discharge date: 08/11/2021  Admitted From: Home Disposition: Home  Recommendations for Outpatient Follow-up:  Follow up with PCP in 1-2 weeks Please obtain BMP/CBC in one week Repeat CT abdomen in about 4 weeks to see the resolution of abscess. Please follow up on the following pending results: None  Home Health: No Equipment/Devices: None Discharge Condition: Stable CODE STATUS: Full Diet recommendation: Heart Healthy   Brief/Interim Summary: Deborah Koch is a 65 y.o. Caucasian female with medical history significant for anxiety, depression, hypertension, dyslipidemia, DDD, scoliosis, C. difficile colitis, severe aortic stenosis, and ascending aortic aneurysm, who presented to the ER with acute onset of severe lower abdominal pain that started last week on Monday with associated diarrhea initially followed by constipation.  Her pain has been stabbing and colicky lasting about a minute then resolving.  It has been waking her up in the middle of the night.  He has been worsening after eating.    On arrival she had mild leukocytosis.  Abdominal CT with mild sigmoid diverticulitis and a concerning 2.7 cm diverticular abscess.  Large stool burden was noted. General surgery was consulted and they are recommending conservative management. Initially given Unasyn and later started on Zosyn, seems improving.  Able to tolerate diet without any difficulty.  Abdominal pain improved.  She was discharged on ciprofloxacin and Flagyl to complete a 10-day course.  She will need a repeat CT abdomen in 4 weeks to see the resolution of abscess.  Her PCP should be able to order it.  Constipation resolved with bowel regimen.  She was advised to continue with bowel regimen and avoid constipation.  She was also found to have moderate protein caloric  malnutrition and dietitian was consulted who provided her with their recommendations.  She will continue with rest of her home medications and follow-up with her providers.  Discharge Diagnoses:  Active Problems:   Lower abdominal pain   Diverticulitis   Protein-calorie malnutrition, severe   Abscess   Constipation   Discharge Instructions  Discharge Instructions     Diet - low sodium heart healthy   Complete by: As directed    Discharge instructions   Complete by: As directed    It was pleasure taking care of you. You are being given antibiotics for 7 more days, please take it as directed and complete the course. Slowly advance your diet and keep your self well-hydrated. Avoid constipation, you can use MiraLAX or other stool softeners as needed. Follow-up with your doctors closely for further recommendations.   Increase activity slowly   Complete by: As directed       Allergies as of 08/11/2021       Reactions   Hydrocodone Hives, Rash        Medication List     TAKE these medications    (feeding supplement) PROSource Plus liquid Take 30 mLs by mouth 3 (three) times daily between meals.   feeding supplement Liqd Take 237 mLs by mouth 3 (three) times daily between meals.   ciprofloxacin 500 MG tablet Commonly known as: Cipro Take 1 tablet (500 mg total) by mouth 2 (two) times daily for 7 days.   clonazePAM 0.5 MG tablet Commonly known as: KLONOPIN Take 1 tablet (0.5 mg total) by mouth 3 (three) times daily as needed for anxiety.   escitalopram 10 MG tablet Commonly known as: LEXAPRO TAKE 1  TABLET BY MOUTH EVERY DAY   furosemide 40 MG tablet Commonly known as: LASIX Take 40 mg by mouth daily.   losartan 25 MG tablet Commonly known as: COZAAR Take 1 tablet (25 mg total) by mouth daily.   metoprolol tartrate 50 MG tablet Commonly known as: LOPRESSOR Take 50 mg by mouth 2 (two) times daily.   metroNIDAZOLE 500 MG tablet Commonly known as:  Flagyl Take 1 tablet (500 mg total) by mouth 3 (three) times daily for 7 days.   multivitamin with minerals Tabs tablet Take 1 tablet by mouth daily.   ondansetron 4 MG disintegrating tablet Commonly known as: Zofran ODT Take 1 tablet (4 mg total) by mouth every 8 (eight) hours as needed for nausea or vomiting.   polyethylene glycol 17 g packet Commonly known as: MIRALAX / GLYCOLAX Take 17 g by mouth daily.   potassium chloride 10 MEQ tablet Commonly known as: KLOR-CON Take 1 tablet (10 mEq total) by mouth 2 (two) times daily.   rosuvastatin 5 MG tablet Commonly known as: CRESTOR Take 5 mg by mouth daily.        Follow-up Information     Arnett, [name], FNP. Schedule an appointment as soon as possible for a visit in 1 week(s).   Specialty: Family Medicine Why: they will call patient with follow up appointment Contact information: [place] [address] [place] [address] [phone]         Minna Merritts, MD. Go on 08/31/2021.   Specialty: Cardiology Why: 10:30 am appointment Contact information: [address] [place] [address] [phone]                Allergies  Allergen Reactions   Hydrocodone Hives and Rash    Consultations: General surgery Interventional radiology  Procedures/Studies: CT ABDOMEN PELVIS W CONTRAST  Result Date: 08/08/2021 CLINICAL DATA:  Severe lower abdominal pain for 1 week. Elevated lipase. Constipation. EXAM: CT ABDOMEN AND PELVIS WITH CONTRAST TECHNIQUE: Multidetector CT imaging of the abdomen and pelvis was performed using the standard protocol following bolus administration of intravenous contrast. CONTRAST:  73m OMNIPAQUE IOHEXOL 350 MG/ML SOLN COMPARISON:  05/02/2021 FINDINGS: Lower Chest: No acute findings. Previously seen pericardial effusion has resolved since prior exam. Hepatobiliary: No hepatic masses identified. Two sub-cm cysts are noted in the left hepatic lobe. Gallbladder is  unremarkable. No evidence of biliary ductal dilatation. Pancreas:  No mass or inflammatory changes. Spleen: There has been near complete resolution of subcapsular perisplenic hematoma since prior study. No evidence of splenic mass or splenomegaly. Adrenals/Urinary Tract: No masses identified. No evidence of ureteral calculi or hydronephrosis. Stomach/Bowel: Mild sigmoid diverticulitis is seen. A rim enhancing diverticular abscess is seen measuring 2.7 x 2.5 cm. No evidence of extraluminal air. Large amount of stool is seen throughout the colon. Vascular/Lymphatic: No pathologically enlarged lymph nodes. No acute vascular findings. Aortic atherosclerotic calcification noted. Reproductive:  No mass or other significant abnormality. Other:  None. Musculoskeletal: No suspicious bone lesions identified. Moderate thoracolumbar scoliosis again noted. IMPRESSION: Mild sigmoid diverticulitis, with 2.7 cm diverticular abscess. Large stool burden noted; recommend clinical correlation for possible constipation. Aortic Atherosclerosis (ICD10-I70.0). Electronically Signed   By: JMarlaine HindM.D.   On: 08/08/2021 14:00    Subjective: Patient was seen and examined today.  No new complaints.  Denies any nausea, vomiting.  Abdominal pain improving.  Able to tolerate diet well.  Discharge Exam: Vitals:   08/11/21 0522 08/11/21 0808  BP: 139/79 136/69  Pulse: (!) 55 (!) 50  Resp: 15 18  Temp: 98.3 F (36.8 C) 98.1 F (36.7 C)  SpO2: 96% 95%   Vitals:   08/10/21 1923 08/10/21 1943 08/11/21 0522 08/11/21 0808  BP: (!) 142/99 138/67 139/79 136/69  Pulse: 62 60 (!) 55 (!) 50  Resp: '16 15 15 18  '$ Temp: 98 F (36.7 C) 98.8 F (37.1 C) 98.3 F (36.8 C) 98.1 F (36.7 C)  TempSrc: Oral Oral Oral   SpO2: 99% 98% 96% 95%  Weight:      Height:        General: Pt is alert, awake, not in acute distress Cardiovascular: RRR, S1/S2 +, no rubs, no gallops Respiratory: CTA bilaterally, no wheezing, no rhonchi Abdominal:  Soft, NT, ND, bowel sounds + Extremities: no edema, no cyanosis   The results of significant diagnostics from this hospitalization (including imaging, microbiology, ancillary and laboratory) are listed below for reference.    Microbiology: Recent Results (from the past 240 hour(s))  SARS CORONAVIRUS 2 (TAT 6-24 HRS) Nasopharyngeal Nasopharyngeal Swab     Status: None   Collection Time: 08/08/21  6:10 PM   Specimen: Nasopharyngeal Swab  Result Value Ref Range Status   SARS Coronavirus 2 NEGATIVE NEGATIVE Final    Comment: (NOTE) SARS-CoV-2 target nucleic acids are NOT DETECTED.  The SARS-CoV-2 RNA is generally detectable in upper and lower respiratory specimens during the acute phase of infection. Negative results do not preclude SARS-CoV-2 infection, do not rule out co-infections with other pathogens, and should not be used as the sole basis for treatment or other patient management decisions. Negative results must be combined with clinical observations, patient history, and epidemiological information. The expected result is Negative.  Fact Sheet for Patients: SugarRoll.be  Fact Sheet for Healthcare Providers: https://www.woods-mathews.com/  This test is not yet approved or cleared by the [place] FDA and  has been authorized for detection and/or diagnosis of SARS-CoV-2 by FDA under an Emergency Use Authorization (EUA). This EUA will remain  in effect (meaning this test can be used) for the duration of the COVID-19 declaration under Se ction 564(b)(1) of the Act, 21 U.S.C. section 360bbb-3(b)(1), unless the authorization is terminated or revoked sooner.  Performed at [facility], [place] [address], [place], [place] [address]      Labs: BNP (last 3 results) Recent Labs    05/02/21 0757  BNP [id number]*   Basic Metabolic Panel: Recent Labs  Lab 08/08/21 1010 08/09/21 0700  NA 131* 137  K 4.4 3.9  CL 94* 102  CO2 29  27  GLUCOSE 108* 72  BUN 11 10  CREATININE 0.63 0.75  CALCIUM 9.6 9.2   Liver Function Tests: Recent Labs  Lab 08/08/21 1010  AST 21  ALT 18  ALKPHOS 106  BILITOT 0.6  PROT 7.9  ALBUMIN 4.0   Recent Labs  Lab 08/08/21 1010  LIPASE 123*   No results for input(s): AMMONIA in the last 168 hours. CBC: Recent Labs  Lab 08/08/21 1010 08/09/21 0700  WBC 11.2* 8.0  HGB 11.9* 10.5*  HCT 36.2 31.7*  MCV 82.3 81.5  PLT 319 276   Cardiac Enzymes: No results for input(s): CKTOTAL, CKMB, CKMBINDEX, TROPONINI in the last 168 hours. BNP: Invalid input(s): POCBNP CBG: No results for input(s): GLUCAP in the last 168 hours. D-Dimer No results for input(s): DDIMER in the last 72 hours. Hgb A1c No results for input(s): HGBA1C in the last 72 hours. Lipid Profile No results for  input(s): CHOL, HDL, LDLCALC, TRIG, CHOLHDL, LDLDIRECT in the last 72 hours. Thyroid function studies No results for input(s): TSH, T4TOTAL, T3FREE, THYROIDAB in the last 72 hours.  Invalid input(s): FREET3 Anemia work up No results for input(s): VITAMINB12, FOLATE, FERRITIN, TIBC, IRON, RETICCTPCT in the last 72 hours. Urinalysis    Component Value Date/Time   COLORURINE YELLOW 08/08/2021 1010   APPEARANCEUR CLEAR 08/08/2021 1010   APPEARANCEUR Clear 07/18/2014 1558   LABSPEC 1.010 08/08/2021 1010   LABSPEC 1.002 07/18/2014 1558   PHURINE 7.0 08/08/2021 1010   GLUCOSEU NEGATIVE 08/08/2021 1010   GLUCOSEU NEGATIVE 12/26/2016 1045   HGBUR NEGATIVE 08/08/2021 1010   BILIRUBINUR NEGATIVE 08/08/2021 1010   BILIRUBINUR negative 12/26/2016 1056   BILIRUBINUR Negative 07/18/2014 1558   KETONESUR NEGATIVE 08/08/2021 1010   PROTEINUR NEGATIVE 08/08/2021 1010   UROBILINOGEN 0.2 12/26/2016 1056   UROBILINOGEN 0.2 12/26/2016 1045   NITRITE NEGATIVE 08/08/2021 1010   LEUKOCYTESUR NEGATIVE 08/08/2021 1010   LEUKOCYTESUR Negative 07/18/2014 1558   Sepsis Labs Invalid input(s): PROCALCITONIN,  WBC,   LACTICIDVEN Microbiology Recent Results (from the past 240 hour(s))  SARS CORONAVIRUS 2 (TAT 6-24 HRS) Nasopharyngeal Nasopharyngeal Swab     Status: None   Collection Time: 08/08/21  6:10 PM   Specimen: Nasopharyngeal Swab  Result Value Ref Range Status   SARS Coronavirus 2 NEGATIVE NEGATIVE Final    Comment: (NOTE) SARS-CoV-2 target nucleic acids are NOT DETECTED.  The SARS-CoV-2 RNA is generally detectable in upper and lower respiratory specimens during the acute phase of infection. Negative results do not preclude SARS-CoV-2 infection, do not rule out co-infections with other pathogens, and should not be used as the sole basis for treatment or other patient management decisions. Negative results must be combined with clinical observations, patient history, and epidemiological information. The expected result is Negative.  Fact Sheet for Patients: SugarRoll.be  Fact Sheet for Healthcare Providers: https://www.woods-mathews.com/  This test is not yet approved or cleared by the [place] FDA and  has been authorized for detection and/or diagnosis of SARS-CoV-2 by FDA under an Emergency Use Authorization (EUA). This EUA will remain  in effect (meaning this test can be used) for the duration of the COVID-19 declaration under Se ction 564(b)(1) of the Act, 21 U.S.C. section 360bbb-3(b)(1), unless the authorization is terminated or revoked sooner.  Performed at [facility], [place] [address], [place], [place] [address]     Time coordinating discharge: Over 30 minutes  SIGNED:  Lorella Nimrod, MD  [facility] 08/11/2021, 11:07 AM  If 7PM-7AM, please contact night-coverage [email or web address]  This record has been created using Systems analyst. Errors have been sought and corrected,but may not always be located. Such creation errors do not reflect on the standard of care.

## 2021-08-11 NOTE — Telephone Encounter (Signed)
The patient is still currently admitted.   MyChart message sent to the patient with Dr. Donivan Scull recommendations.

## 2021-08-11 NOTE — Telephone Encounter (Signed)
[facility] is calling to schedule the patient for a hospital follow up within one week.No openings available until 9/9,please advise.

## 2021-08-11 NOTE — Telephone Encounter (Signed)
Placed call to pt. Went ahead and scheduled pt for 9/9 and let her know the office will call her if anything opens up.

## 2021-08-11 NOTE — Telephone Encounter (Signed)
Clonazepam refilled for #90 for 30 days due to PCP being on vacation.  Review of PDMP shows last refill July 22 .  NEEDS TO REDUCE TO 2 DAILY  PER NOTES FROM [facility] DURING June VISIT. PLEASE SCHEDULE FOLLOW UP ASAP

## 2021-08-11 NOTE — Plan of Care (Signed)
Patient discharge instructions reviewed including appts, and medication changes with patient who verbalized understanding. Patient alert, oriented and independent with ambulation. Denied pain or issues with breakfast. Assisted patient to main entrance via w/c for discharge with sister.

## 2021-08-11 NOTE — Telephone Encounter (Signed)
Rox, Talmadge "[name]" - 08/09/2021 11:02 AM Minna Merritts, MD  Sent: Wed August 10, 2021  2:59 PM  To: Emily Filbert, RN          Message  Numbers look good would stay on the twice a day potassium  Tg

## 2021-08-11 NOTE — Care Management Important Message (Signed)
Important Message  Patient Details  Name: Deborah Koch MRN: [id number] Date of Birth: Oct 10, 1956   Medicare Important Message Given:  No  Patient discharged prior to arrival to unit to deliver concurrent Medicare IM.  Initial Medicare IM signed 08/08/2021 at 2:18pm.   Dannette Barbara 08/11/2021, 1:15 PM

## 2021-08-11 NOTE — Telephone Encounter (Signed)
Spoke with pt and she stated that she does not have enough to get her through until Joycelyn Schmid returns on Monday.   Refilled: 05/20/2021 Last OV: 05/20/2021 Next OV: 08/26/2021

## 2021-08-12 ENCOUNTER — Telehealth: Payer: Self-pay

## 2021-08-12 NOTE — Telephone Encounter (Signed)
Scheduled for 08/26/2021 with PCP.

## 2021-08-12 NOTE — Telephone Encounter (Signed)
  Transition Care Management Unsuccessful Follow-up Telephone Call  Date of discharge and from where:  08/11/2021-[facility]  Attempts:  1st Attempt  Reason for unsuccessful TCM follow-up call:  No answer/busy

## 2021-08-15 NOTE — Telephone Encounter (Signed)
Transition Care Management Unsuccessful Follow-up Telephone Call  Date of discharge and from where:  08/11/21 from [facility]  Attempts:  2nd Attempt  Reason for unsuccessful TCM follow-up call:  Unable to reach patient. Will follow. HFU scheduled 9/9.

## 2021-08-16 NOTE — Telephone Encounter (Signed)
Transition Care Management Unsuccessful Follow-up Telephone Call  Date of discharge and from where:  08/11/21 from [facility]  Attempts:  3rd Attempt  Reason for unsuccessful TCM follow-up call:  Left voice message HFU previously changed to 08/31/21. No further follow up from nurse at this time.

## 2021-08-17 ENCOUNTER — Ambulatory Visit: Payer: Medicare Other | Admitting: Surgery

## 2021-08-26 ENCOUNTER — Telehealth: Payer: Self-pay | Admitting: Physician Assistant

## 2021-08-26 ENCOUNTER — Inpatient Hospital Stay: Payer: Medicare Other | Admitting: Family

## 2021-08-26 NOTE — Telephone Encounter (Signed)
I have never previously evaluated the patient.  It appears her hospital admission was noncardiac in etiology, so I am unclear as to why she was advised to follow-up with cardiology at hospital discharge.  She can follow-up with [place] as previously directed and with PCP as scheduled.  If there are cardiac concerns she can schedule an appointment with [place].

## 2021-08-26 NOTE — Telephone Encounter (Signed)
I spoke with the patient and advised her of Ryan's recommendations as stated below.  She voices understanding and is agreeable with cancelling her appointment with Cardiology next week as her recent admission was for diverticulitis.   The patient is aware of her January appointment with Dr. Rockey Situ and is agreeable. She will call back sooner if needed for any cardiac concerns.

## 2021-08-26 NOTE — Telephone Encounter (Signed)
Patient calling to ask if appt needed.  Not advised at hospital dc to see cards.  She is scheduled with pcp office same day and previously noted to fu cards in 6 m.    Please advise if this visit next week can be cancelled .

## 2021-08-31 ENCOUNTER — Other Ambulatory Visit: Payer: Self-pay

## 2021-08-31 ENCOUNTER — Telehealth: Payer: Self-pay | Admitting: Family

## 2021-08-31 ENCOUNTER — Encounter: Payer: Self-pay | Admitting: Family

## 2021-08-31 ENCOUNTER — Ambulatory Visit (INDEPENDENT_AMBULATORY_CARE_PROVIDER_SITE_OTHER): Payer: Medicare Other | Admitting: Family

## 2021-08-31 ENCOUNTER — Other Ambulatory Visit: Payer: Self-pay | Admitting: Family

## 2021-08-31 ENCOUNTER — Ambulatory Visit: Payer: Medicare Other | Admitting: Physician Assistant

## 2021-08-31 VITALS — BP 121/70 | HR 53 | Temp 98.1°F | Ht 63.0 in | Wt 121.6 lb

## 2021-08-31 DIAGNOSIS — R109 Unspecified abdominal pain: Secondary | ICD-10-CM | POA: Diagnosis not present

## 2021-08-31 DIAGNOSIS — K5792 Diverticulitis of intestine, part unspecified, without perforation or abscess without bleeding: Secondary | ICD-10-CM

## 2021-08-31 DIAGNOSIS — I10 Essential (primary) hypertension: Secondary | ICD-10-CM | POA: Diagnosis not present

## 2021-08-31 DIAGNOSIS — R232 Flushing: Secondary | ICD-10-CM

## 2021-08-31 DIAGNOSIS — Z780 Asymptomatic menopausal state: Secondary | ICD-10-CM

## 2021-08-31 DIAGNOSIS — R7989 Other specified abnormal findings of blood chemistry: Secondary | ICD-10-CM

## 2021-08-31 LAB — CBC WITH DIFFERENTIAL/PLATELET
Basophils Absolute: 0.1 10*3/uL (ref 0.0–0.1)
Basophils Relative: 1.1 % (ref 0.0–3.0)
Eosinophils Absolute: 0.2 10*3/uL (ref 0.0–0.7)
Eosinophils Relative: 3.7 % (ref 0.0–5.0)
HCT: 36.3 % (ref 36.0–46.0)
Hemoglobin: 11.8 g/dL — ABNORMAL LOW (ref 12.0–15.0)
Lymphocytes Relative: 29.4 % (ref 12.0–46.0)
Lymphs Abs: 1.8 10*3/uL (ref 0.7–4.0)
MCHC: 32.6 g/dL (ref 30.0–36.0)
MCV: 82.8 fl (ref 78.0–100.0)
Monocytes Absolute: 0.6 10*3/uL (ref 0.1–1.0)
Monocytes Relative: 9.2 % (ref 3.0–12.0)
Neutro Abs: 3.5 10*3/uL (ref 1.4–7.7)
Neutrophils Relative %: 56.6 % (ref 43.0–77.0)
Platelets: 291 10*3/uL (ref 150.0–400.0)
RBC: 4.38 Mil/uL (ref 3.87–5.11)
RDW: 22.8 % — ABNORMAL HIGH (ref 11.5–15.5)
WBC: 6.2 10*3/uL (ref 4.0–10.5)

## 2021-08-31 LAB — COMPREHENSIVE METABOLIC PANEL
ALT: 28 U/L (ref 0–35)
AST: 33 U/L (ref 0–37)
Albumin: 4.2 g/dL (ref 3.5–5.2)
Alkaline Phosphatase: 90 U/L (ref 39–117)
BUN: 19 mg/dL (ref 6–23)
CO2: 31 mEq/L (ref 19–32)
Calcium: 10 mg/dL (ref 8.4–10.5)
Chloride: 98 mEq/L (ref 96–112)
Creatinine, Ser: 0.71 mg/dL (ref 0.40–1.20)
GFR: 88.7 mL/min (ref >60.00)
Glucose, Bld: 74 mg/dL (ref 70–99)
Potassium: 4.7 mEq/L (ref 3.5–5.1)
Sodium: 136 mEq/L (ref 135–145)
Total Bilirubin: 0.5 mg/dL (ref 0.2–1.2)
Total Protein: 7.4 g/dL (ref 6.0–8.3)

## 2021-08-31 LAB — TSH: TSH: 9.72 u[IU]/mL — ABNORMAL HIGH (ref 0.35–5.50)

## 2021-08-31 LAB — FOLLICLE STIMULATING HORMONE: FSH: 102.4 m[IU]/mL

## 2021-08-31 NOTE — Progress Notes (Signed)
Subjective:    Patient ID: Deborah Koch, female    DOB: March 20, 1956, 65 y.o.   MRN: [id number]  CC: Deborah Koch is a 65 y.o. female who presents today for hospital follow up.   HPI: Feels well today Appetite is improving. She would like to gain more weight.  Energy is improving.  She has completed Flagyl, Cipro.  She complains of night sweats , and she describes that she will soak her shirt . On going 1 year. Only occur at night, approx 3x per week.  Disrupts her sleep.  No fever, dysuria, abdominal pain, diarrhea, cough, bone pain, unusual weight loss.   Last menses 7 years ago.   Reports that both her sisters have very similar symptoms as going through menopause.     Colonoscopy 10/2017 Dr Allen Norris  Patient admitted to hospital 08/08/2021 due to severe lower abdominal pain, diarrhea followed by constipation.  She was discharged 08/11/2021. Hospital course significant for leukocytosis, CT with mild sigmoid diverticulitis and concerning for 2.7 cm diverticular abscess.  General surgery consulted and recommended conservative management.  Initially given Unasyn and later started on Zosyn with improvement.  Discharged on ciprofloxacin '500mg'$  and Flagyl '500mg'$  to complete 10-day course.  Advise repeat CT abdomen pelvis in 4 weeks. HISTORY:  Past Medical History:  Diagnosis Date   Anxiety    Ascending Aortic Aneurysm    a. 04/2021 s/p replacement of the ascending aorta and aortic arch with aorta-innominate and aorta-left carotid bypass with supra coronary anastomosis as well as AVR with 23 mm Edwards pericardial tissue valve.   C. difficile colitis    a. 07/2014.   Congenital bicuspid aortic valve    Depression    Diverticulitis    hospitalized; subsequent c diff infection.    GERD (gastroesophageal reflux disease)    H/O mastitis 2015   Heart murmur    History of cardiac catheterization    a. 03/2021 Nl cors.   History of tobacco abuse    a. 20 yrs, 1/4 ppd - quit.   HTN  (hypertension)    Hypokalemia    Multilevel degenerative disc disease    PAF (paroxysmal atrial fibrillation) ([place])    a. 04/2021-->amio. No OAC 2/2 concern for hemopericardium (post-op Ao arch/AVR).   Pleural effusion, bilateral    a. 04/2021 s/p thoracic surgery.   Pneumonia    at age 30   Scoliosis    Severe aortic stenosis w/ congenital bicuspid AoV    a. 04/2021 s/p 1m edwards pericardial tissue valve; b. 05/12/2021 Echo: EF 55-60%, no rwma, Gr2 DD, nl RV fxn, mild BAE, moderate pericardial effusion, mild MR, mild to mod TR, Nl fxn'ing AoV prosthesis.   Past Surgical History:  Procedure Laterality Date   AORTA -INNOMIATE BYPASS N/A 04/21/2021   Procedure: AORTA -ITitus MouldBYPASS;  Surgeon: BGaye Pollack MD;  Location: [facility];  Service: Vascular;  Laterality: N/A;   AORTIC VALVE REPLACEMENT N/A 04/21/2021   Procedure: AORTIC VALVE REPLACEMENT (AVR) ON PUMP WITH 23MM INSPIRIS AORTIC VALVE;  Surgeon: BGaye Pollack MD;  [facility] [facility]  Service: Open Heart Surgery;  Laterality: N/A;   BARTHOLIN GLAND CYST EXCISION     BREAST BIOPSY Left    neg   CAROTID-SUBCLAVIAN BYPASS GRAFT Left 04/21/2021   Procedure: AORTA LEFT COMMON CAROTID BYPASS;  Surgeon: BGaye Pollack MD;  [facility];  Service: Vascular;  Laterality: Left;   COLONOSCOPY  2013   Dr. WAllen Norris  COLONOSCOPY WITH PROPOFOL  N/A 10/23/2017   Procedure: COLONOSCOPY WITH PROPOFOL;  Surgeon: Lucilla Lame, MD;  Location: [facility] ENDOSCOPY;  Service: Endoscopy;  Laterality: N/A;   OOPHORECTOMY  2016   OVARY SURGERY  2016   cyst on right ovary   REPLACEMENT ASCENDING AORTA N/A 04/21/2021   Procedure: REPLACEMENT ASCENDING & ARCH AORTA WITH HEMASHIELD PLATINUM GRAFT 28 [id number];  Surgeon: Gaye Pollack, MD;  Location: [place];  Service: Open Heart Surgery;  Laterality: N/A;  CIRC ARREST  RIGHT AXILLARY ARTERY CANNULATION   RIGHT/LEFT HEART CATH AND CORONARY ANGIOGRAPHY N/A 03/24/2021   Procedure: RIGHT/LEFT HEART CATH AND CORONARY  ANGIOGRAPHY;  Surgeon: Minna Merritts, MD;  Location: [place] [facility];  Service: Cardiovascular;  Laterality: N/A;   SALPINGECTOMY Bilateral    TEE WITHOUT CARDIOVERSION N/A 04/21/2021   Procedure: TRANSESOPHAGEAL ECHOCARDIOGRAM (TEE);  Surgeon: Gaye Pollack, MD;  Location: [place];  Service: Open Heart Surgery;  Laterality: N/A;   Family History  Problem Relation Age of Onset   Hypertension Mother    Colon cancer Mother    Anxiety disorder Mother    Depression Mother    Heart failure Father    Hypertension Father    Depression Sister    Anxiety disorder Sister    Alcohol abuse Brother    Schizophrenia Brother    Breast cancer Maternal Aunt    Heart attack Neg Hx    Stroke Neg Hx     Allergies: Hydrocodone Current Outpatient Medications on File Prior to Visit  Medication Sig Dispense Refill   clonazePAM (KLONOPIN) 0.5 MG tablet TAKE 1 TABLET (0.5 MG TOTAL) BY MOUTH 3 (THREE) TIMES DAILY AS NEEDED FOR ANXIETY. 90 tablet 0   escitalopram (LEXAPRO) 10 MG tablet TAKE 1 TABLET BY MOUTH EVERY DAY 90 tablet 1   feeding supplement (ENSURE ENLIVE / ENSURE PLUS) LIQD Take 237 mLs by mouth 3 (three) times daily between meals. 237 mL 12   furosemide (LASIX) 40 MG tablet Take 40 mg by mouth daily.     metoprolol tartrate (LOPRESSOR) 50 MG tablet Take 50 mg by mouth 2 (two) times daily.     Multiple Vitamin (MULTIVITAMIN WITH MINERALS) TABS tablet Take 1 tablet by mouth daily. 90 tablet 0   Nutritional Supplements (,FEEDING SUPPLEMENT, PROSOURCE PLUS) liquid Take 30 mLs by mouth 3 (three) times daily between meals. 1000 mL 0   ondansetron (ZOFRAN ODT) 4 MG disintegrating tablet Take 1 tablet (4 mg total) by mouth every 8 (eight) hours as needed for nausea or vomiting. 30 tablet 0   polyethylene glycol (MIRALAX / GLYCOLAX) 17 g packet Take 17 g by mouth daily. 14 each 0   potassium chloride (KLOR-CON) 10 MEQ tablet Take 1 tablet (10 mEq total) by mouth 2 (two) times daily. 180 tablet 3    rosuvastatin (CRESTOR) 5 MG tablet Take 5 mg by mouth daily.     losartan (COZAAR) 25 MG tablet Take 1 tablet (25 mg total) by mouth daily. 90 tablet 3   Current Facility-Administered Medications on File Prior to Visit  Medication Dose Route Frequency Provider Last Rate Last Admin   sodium chloride flush (NS) 0.9 % injection 3 mL  3 mL Intravenous Q12H Marrianne Mood D, PA-C        Social History   Tobacco Use   Smoking status: Former    Types: Cigarettes    Quit date: 04/20/2021    Years since quitting: 0.3   Smokeless tobacco: Never  Vaping Use   Vaping  Use: Never used  Substance Use Topics   Alcohol use: Yes    Alcohol/week: 3.0 standard drinks    Types: 3 Glasses of wine per week    Comment:  2-3 glasses of wine every other evening   Drug use: No    Review of Systems  Constitutional:  Positive for diaphoresis. Negative for chills and fever.  Respiratory:  Negative for cough.   Cardiovascular:  Negative for chest pain and palpitations.  Gastrointestinal:  Negative for abdominal pain, diarrhea, nausea and vomiting.  Genitourinary:  Negative for dysuria.     Objective:    BP 121/70 (BP Location: Left Arm, Patient Position: Sitting, Cuff Size: Normal)   Pulse (!) 53   Temp 98.1 F (36.7 C) (Oral)   Ht '5\' 3"'$  (1.6 m)   Wt 121 lb 9.6 oz (55.2 kg)   SpO2 99%   BMI 21.54 kg/m  BP Readings from Last 3 Encounters:  08/31/21 121/70  08/11/21 136/69  07/11/21 130/70   Wt Readings from Last 3 Encounters:  08/31/21 121 lb 9.6 oz (55.2 kg)  08/08/21 118 lb 6.2 oz (53.7 kg)  07/11/21 118 lb (53.5 kg)    Physical Exam Vitals reviewed.  Constitutional:      Appearance: Normal appearance. She is well-developed.  Eyes:     Conjunctiva/sclera: Conjunctivae normal.  Neck:     Thyroid: No thyroid mass or thyromegaly.  Cardiovascular:     Rate and Rhythm: Normal rate and regular rhythm.     Pulses: Normal pulses.     Heart sounds: Normal heart sounds.  Pulmonary:      Effort: Pulmonary effort is normal.     Breath sounds: Normal breath sounds. No wheezing, rhonchi or rales.  Abdominal:     General: Bowel sounds are normal. There is no distension.     Palpations: Abdomen is soft. Abdomen is not rigid. There is no fluid wave or mass.     Tenderness: There is no abdominal tenderness. There is no guarding or rebound.  Lymphadenopathy:     Head:     Right side of head: No submental, submandibular, tonsillar, preauricular, posterior auricular or occipital adenopathy.     Left side of head: No submental, submandibular, tonsillar, preauricular, posterior auricular or occipital adenopathy.     Cervical: No cervical adenopathy.     Upper Body:     Right upper body: No axillary adenopathy.     Left upper body: No axillary adenopathy.  Skin:    General: Skin is warm and dry.  Neurological:     Mental Status: She is alert.  Psychiatric:        Speech: Speech normal.        Behavior: Behavior normal.        Thought Content: Thought content normal.       Assessment & Plan:   Problem List Items Addressed This Visit       Cardiovascular and Mediastinum   Hot flashes    New to me.  Duration x1 year.  Pending lab evaluation.  Emphasized importance of patient ensuring all preventative screenings are up-to-date.  She is due for mammogram.  We have ordered mammogram. Will await lab results.  If symptoms worsens or new symptoms develop, patient will let me know so we can pursue further evaluation      Relevant Orders   FSH   Prolactin   TSH     Other   Diverticulitis - Primary    No abdominal  pain or guarding on exam.  Patient completed Flagyl, ciprofloxacin.   reviewed hospital course and medications reconciled.  CBC, CMP ordered as well as repeat  CT abdomen and pelvis.  Advised patient she will require follow-up with gastroenterology after diverticulitis. referral is in place to Dr. Allen Norris.       Relevant Orders   CT ABDOMEN PELVIS W CONTRAST   CBC with  Differential/Platelet   Comprehensive metabolic panel   Ambulatory referral to Gastroenterology   Other Visit Diagnoses     Abdominal pain, unspecified abdominal location       Asymptomatic menopausal state       Relevant Orders   MM 3D SCREEN BREAST BILATERAL   DG Bone Density        I am having [name]. Marzetta Board "[name]" maintain her losartan, ondansetron, escitalopram, potassium chloride, furosemide, metoprolol tartrate, rosuvastatin, clonazePAM, polyethylene glycol, (feeding supplement) PROSource Plus, feeding supplement, and multivitamin with minerals.   No orders of the defined types were placed in this encounter.   Return precautions given.   Risks, benefits, and alternatives of the medications and treatment plan prescribed today were discussed, and patient expressed understanding.   Education regarding symptom management and diagnosis given to patient on AVS.  Continue to follow with Burnard Hawthorne, FNP for routine health maintenance.   [name] and I agreed with plan.   Mable Paris, FNP   I have spent 35 minutes with a patient including precharting, exam, reviewing medical records, and discussion plan of care.

## 2021-08-31 NOTE — Telephone Encounter (Signed)
Lft pt vm to call ofc to sch CT abdomen pelvis. thanks

## 2021-08-31 NOTE — Assessment & Plan Note (Signed)
No abdominal pain or guarding on exam.  Patient completed Flagyl, ciprofloxacin.   reviewed hospital course and medications reconciled.  CBC, CMP ordered as well as repeat  CT abdomen and pelvis.  Advised patient she will require follow-up with gastroenterology after diverticulitis. referral is in place to Dr. Allen Norris.

## 2021-08-31 NOTE — Patient Instructions (Signed)
I have ordered CT abdomen and pelvis this is due next week approximately 09/08/2021.  I place referral back to gastroenterology, Dr. Allen Norris for follow-up after diverticulitis. Let us know if you dont hear back within a week in regards to an appointment being scheduled.   As discussed as it relates to night sweats, if you develop bone pain, unusual weight loss, or night sweats were to become more persistent, more severe, please let me know right away.  I have included labs today to look from perimenopause.  Nice to see you!

## 2021-08-31 NOTE — Assessment & Plan Note (Signed)
New to me.  Duration x1 year.  Pending lab evaluation.  Emphasized importance of patient ensuring all preventative screenings are up-to-date.  She is due for mammogram.  We have ordered mammogram. Will await lab results.  If symptoms worsens or new symptoms develop, patient will let me know so we can pursue further evaluation

## 2021-09-01 LAB — PROLACTIN: Prolactin: 8.5 ng/mL

## 2021-09-02 ENCOUNTER — Telehealth: Payer: Self-pay | Admitting: Family

## 2021-09-02 NOTE — Telephone Encounter (Signed)
Lft pt vm to call ofc to sch Ct abdomen. thanks

## 2021-09-08 ENCOUNTER — Telehealth: Payer: Self-pay | Admitting: Family

## 2021-09-08 NOTE — Telephone Encounter (Signed)
What is status of CT abdomen pelvis?  This was ordered 9 /14 This is due 4 weeks after hospitalization.  CT abdomen and pelvis due 09/09/2019

## 2021-09-12 ENCOUNTER — Other Ambulatory Visit: Payer: Self-pay | Admitting: Internal Medicine

## 2021-09-12 DIAGNOSIS — F419 Anxiety disorder, unspecified: Secondary | ICD-10-CM

## 2021-09-13 NOTE — Telephone Encounter (Signed)
RX Refill:klonopin Last Seen:09-08-21 Last ordered:08-11-21

## 2021-09-13 NOTE — Telephone Encounter (Signed)
Patient returning your call.

## 2021-09-22 ENCOUNTER — Other Ambulatory Visit: Payer: Self-pay | Admitting: *Deleted

## 2021-09-25 NOTE — Telephone Encounter (Signed)
Deborah Koch  Mail letter  [name],   You are overdue for CT abdomen and pelvis. This was due 09/08/21.   We have tried to reach you.   Please call the office to schedule.

## 2021-09-26 MED ORDER — ROSUVASTATIN CALCIUM 5 MG PO TABS: 5.0000 mg | ORAL_TABLET | Freq: Every day | ORAL | 3 refills | Status: DC

## 2021-09-26 NOTE — Telephone Encounter (Signed)
I have mailed letter to patient.

## 2021-10-04 ENCOUNTER — Telehealth: Payer: Self-pay | Admitting: Family

## 2021-10-04 NOTE — Telephone Encounter (Signed)
Pt called to respond with the letter pt received to sch CT. I spoke with pt last on 09/13/2021 I spoke with pt to sch pt stated she is better and she had a appt with GI.  Pt would like to speak in regards to the CT. Please advise and thank you!

## 2021-10-04 NOTE — Telephone Encounter (Signed)
LMTCB

## 2021-10-04 NOTE — Telephone Encounter (Signed)
Patient returned your call.

## 2021-10-04 NOTE — Telephone Encounter (Signed)
error 

## 2021-10-06 NOTE — Telephone Encounter (Signed)
I spoke with patient & she really did not want to pursue CT as of right now. She stated that she was feeling great & she felt that her "gut health" was good. She BM have been very regular & she has experienced no pain. She also now due to some Medicare changes would have to pay $1500 out of pocket deductible. She does have follow-up in November with Dr. Allen Norris & she feels that will head her in the right direction for now.

## 2021-10-07 NOTE — Telephone Encounter (Signed)
FYI  Dr Allen Norris   Patient declines repeat CT a/p after hospitalized for diverticulitis complicated by abscess in august  She sees you for follow up 11/15/21

## 2021-10-17 ENCOUNTER — Other Ambulatory Visit: Payer: Self-pay | Admitting: Family

## 2021-10-17 DIAGNOSIS — Z1231 Encounter for screening mammogram for malignant neoplasm of breast: Secondary | ICD-10-CM

## 2021-10-18 ENCOUNTER — Other Ambulatory Visit: Payer: Self-pay | Admitting: Cardiovascular Disease

## 2021-11-09 ENCOUNTER — Other Ambulatory Visit: Payer: Self-pay | Admitting: Family

## 2021-11-09 DIAGNOSIS — F32A Depression, unspecified: Secondary | ICD-10-CM

## 2021-11-15 ENCOUNTER — Encounter: Payer: Self-pay | Admitting: Gastroenterology

## 2021-11-15 ENCOUNTER — Ambulatory Visit (INDEPENDENT_AMBULATORY_CARE_PROVIDER_SITE_OTHER): Payer: Medicare Other | Admitting: Gastroenterology

## 2021-11-15 ENCOUNTER — Other Ambulatory Visit: Payer: Self-pay

## 2021-11-15 VITALS — BP 155/79 | HR 54 | Ht 63.0 in | Wt 128.0 lb

## 2021-11-15 DIAGNOSIS — R7401 Elevation of levels of liver transaminase levels: Secondary | ICD-10-CM | POA: Diagnosis not present

## 2021-11-15 DIAGNOSIS — Z01812 Encounter for preprocedural laboratory examination: Secondary | ICD-10-CM

## 2021-11-15 DIAGNOSIS — R1084 Generalized abdominal pain: Secondary | ICD-10-CM | POA: Diagnosis not present

## 2021-11-15 NOTE — Progress Notes (Signed)
Gastroenterology Consultation  Referring Provider:     Burnard Hawthorne, FNP Primary Care Physician:  Burnard Hawthorne, FNP Primary Gastroenterologist:  Dr. Allen Norris     Reason for Consultation:     Diverticulitis        HPI:   Deborah Koch is a 65 y.o. y/o female referred for consultation & management of diverticulitis by Dr. Vidal Schwalbe, [name], [place].  This patient comes in today after seeing me in the past with a history of diverticulitis.  The patient had a CT scan that showed a abscess that was 2.7 cm after a bout of diverticulitis.  The patient reports that she is feeling great without any problems but was sent to me for possible colonoscopy.  The patient's last colonoscopy was in 2018 by me and was recommended to have a repeat in 5 years.  Despite that the patient had a recent bout of diverticulitis and has not had any problems since.  She states that her bowels are now better than they have been in a very long time.  She denies any abdominal pain nausea vomiting fevers or chills.  The patient has been through a lot including aortic valve replacement with a bovine valve and bypass surgery within the last year.  She also had an ascending aortic aneurysm in May 2020 and had a history of C. difficile colitis in 2015.  Past Medical History:  Diagnosis Date   Anxiety    Ascending Aortic Aneurysm    a. 04/2021 s/p replacement of the ascending aorta and aortic arch with aorta-innominate and aorta-left carotid bypass with supra coronary anastomosis as well as AVR with 23 mm Edwards pericardial tissue valve.   C. difficile colitis    a. 07/2014.   Congenital bicuspid aortic valve    Depression    Diverticulitis    hospitalized; subsequent c diff infection.    GERD (gastroesophageal reflux disease)    H/O mastitis 2015   Heart murmur    History of cardiac catheterization    a. 03/2021 Nl cors.   History of tobacco abuse    a. 20 yrs, 1/4 ppd - quit.   HTN (hypertension)     Hypokalemia    Multilevel degenerative disc disease    PAF (paroxysmal atrial fibrillation) ([place])    a. 04/2021-->amio. No OAC 2/2 concern for hemopericardium (post-op Ao arch/AVR).   Pleural effusion, bilateral    a. 04/2021 s/p thoracic surgery.   Pneumonia    at age 12   Scoliosis    Severe aortic stenosis w/ congenital bicuspid AoV    a. 04/2021 s/p 13mm edwards pericardial tissue valve; b. 05/12/2021 Echo: EF 55-60%, no rwma, Gr2 DD, nl RV fxn, mild BAE, moderate pericardial effusion, mild MR, mild to mod TR, Nl fxn'ing AoV prosthesis.    Past Surgical History:  Procedure Laterality Date   AORTA -INNOMIATE BYPASS N/A 04/21/2021   Procedure: AORTA -Titus Mould BYPASS;  Surgeon: Gaye Pollack, MD;  Location: [facility];  Service: Vascular;  Laterality: N/A;   AORTIC VALVE REPLACEMENT N/A 04/21/2021   Procedure: AORTIC VALVE REPLACEMENT (AVR) ON PUMP WITH 23MM INSPIRIS AORTIC VALVE;  Surgeon: Gaye Pollack, MD;  Location: [place];  Service: Open Heart Surgery;  Laterality: N/A;   BARTHOLIN GLAND CYST EXCISION     BREAST BIOPSY Left    neg   CAROTID-SUBCLAVIAN BYPASS GRAFT Left 04/21/2021   Procedure: AORTA LEFT COMMON CAROTID BYPASS;  Surgeon: Gaye Pollack, MD;  Location[facility]  OR;  Service: Vascular;  Laterality: Left;   COLONOSCOPY  2013   Dr. Allen Norris   COLONOSCOPY WITH PROPOFOL N/A 10/23/2017   Procedure: COLONOSCOPY WITH PROPOFOL;  Surgeon: Lucilla Lame, MD;  Location: [facility] ENDOSCOPY;  Service: Endoscopy;  Laterality: N/A;   OOPHORECTOMY  2016   OVARY SURGERY  2016   cyst on right ovary   REPLACEMENT ASCENDING AORTA N/A 04/21/2021   Procedure: REPLACEMENT ASCENDING & ARCH AORTA WITH HEMASHIELD PLATINUM GRAFT 28 [id number];  Surgeon: Gaye Pollack, MD;  Location: [place];  Service: Open Heart Surgery;  Laterality: N/A;  CIRC ARREST  RIGHT AXILLARY ARTERY CANNULATION   RIGHT/LEFT HEART CATH AND CORONARY ANGIOGRAPHY N/A 03/24/2021   Procedure: RIGHT/LEFT HEART CATH AND CORONARY ANGIOGRAPHY;   Surgeon: Minna Merritts, MD;  Location: [place] [facility];  Service: Cardiovascular;  Laterality: N/A;   SALPINGECTOMY Bilateral    TEE WITHOUT CARDIOVERSION N/A 04/21/2021   Procedure: TRANSESOPHAGEAL ECHOCARDIOGRAM (TEE);  Surgeon: Gaye Pollack, MD;  Location: [place];  Service: Open Heart Surgery;  Laterality: N/A;    Prior to Admission medications   Medication Sig Start Date End Date Taking? Authorizing Provider  clonazePAM (KLONOPIN) 0.5 MG tablet TAKE 1 TABLET (0.5 MG TOTAL) BY MOUTH 3 (THREE) TIMES DAILY AS NEEDED FOR ANXIETY. 09/15/21   Burnard Hawthorne, FNP  escitalopram (LEXAPRO) 10 MG tablet TAKE 1 TABLET BY MOUTH EVERY DAY 11/11/21   Burnard Hawthorne, FNP  feeding supplement (ENSURE ENLIVE / ENSURE PLUS) LIQD Take 237 mLs by mouth 3 (three) times daily between meals. 08/11/21   Lorella Nimrod, MD  furosemide (LASIX) 40 MG tablet Take 40 mg by mouth daily.    [provider]  KLOR-CON M10 10 MEQ tablet TAKE 1 TABLET BY MOUTH 2 TIMES DAILY. 10/18/21   Minna Merritts, MD  losartan (COZAAR) 25 MG tablet Take 1 tablet (25 mg total) by mouth daily. 05/20/21 08/18/21  Theora Gianotti, NP  metoprolol tartrate (LOPRESSOR) 50 MG tablet Take 50 mg by mouth 2 (two) times daily.    [provider]  Multiple Vitamin (MULTIVITAMIN WITH MINERALS) TABS tablet Take 1 tablet by mouth daily. 08/11/21   Lorella Nimrod, MD  Nutritional Supplements (,FEEDING SUPPLEMENT, PROSOURCE PLUS) liquid Take 30 mLs by mouth 3 (three) times daily between meals. 08/11/21   Lorella Nimrod, MD  ondansetron (ZOFRAN ODT) 4 MG disintegrating tablet Take 1 tablet (4 mg total) by mouth every 8 (eight) hours as needed for nausea or vomiting. 06/17/21   Burnard Hawthorne, FNP  polyethylene glycol (MIRALAX / GLYCOLAX) 17 g packet Take 17 g by mouth daily. 08/11/21   Lorella Nimrod, MD  rosuvastatin (CRESTOR) 5 MG tablet Take 1 tablet (5 mg total) by mouth daily. 09/26/21   Minna Merritts, MD     Family History  Problem Relation Age of Onset   Hypertension Mother    Colon cancer Mother    Anxiety disorder Mother    Depression Mother    Heart failure Father    Hypertension Father    Depression Sister    Anxiety disorder Sister    Alcohol abuse Brother    Schizophrenia Brother    Breast cancer Maternal Aunt    Heart attack Neg Hx    Stroke Neg Hx      Social History   Tobacco Use   Smoking status: Former    Types: Cigarettes    Quit date: 04/20/2021    Years since quitting: 0.5  Smokeless tobacco: Never  Vaping Use   Vaping Use: Never used  Substance Use Topics   Alcohol use: Yes    Alcohol/week: 3.0 standard drinks    Types: 3 Glasses of wine per week    Comment:  2-3 glasses of wine every other evening   Drug use: No    Allergies as of 11/15/2021 - Review Complete 08/31/2021  Allergen Reaction Noted   Hydrocodone Hives and Rash 06/08/2015    Review of Systems:    All systems reviewed and negative except where noted in HPI.   Physical Exam:  There were no vitals taken for this visit. No LMP recorded. Patient is postmenopausal. General:   Alert,  Well-developed, well-nourished, pleasant and cooperative in NAD Head:  Normocephalic and atraumatic. Eyes:  Sclera clear, no icterus.   Conjunctiva pink. Ears:  Normal auditory acuity. Neck:  Supple; no masses or thyromegaly. Lungs:  Respirations even and unlabored.  Clear throughout to auscultation.   No wheezes, crackles, or rhonchi. No acute distress. Heart:  Regular rate and rhythm; no murmurs, clicks, rubs, or gallops. Abdomen:  Normal bowel sounds.  No bruits.  Soft, non-tender and non-distended without masses, hepatosplenomegaly or hernias noted.  No guarding or rebound tenderness.  Negative Carnett sign.   Rectal:  Deferred.  Pulses:  Normal pulses noted. Extremities:  No clubbing or edema.  No cyanosis. Neurologic:  Alert and oriented x3;  grossly normal neurologically. Skin:  Intact without  significant lesions or rashes.  No jaundice. Lymph Nodes:  No significant cervical adenopathy. Psych:  Alert and cooperative. Normal mood and affect.  Imaging Studies: No results found.  Assessment and Plan:   Deborah Koch is a 65 y.o. y/o female who comes in today with a history of diverticulitis with a abscess.  The patient states that her bowels have been doing very well and her abdominal pain is completely resolved without any symptoms at the present time.  The patient has been told of the recommendations of a colonoscopy for a bout of diverticulitis if she has not had a recent colonoscopy.  The patient would like to hold off on any colonoscopy at the present time but has agreed to get a repeat CT scan of the abdomen to make sure that the abscess has resolved or if it is still present.  The patient has been told that if the abscess is still present she may need drainage versus surgical removal.  She would like to hold off on the colonoscopy until next year since that would be her 5-year mark.  The patient has been explained the plan agrees with it.    Lucilla Lame, MD. Marval Regal    Note: This dictation was prepared with Dragon dictation along with smaller phrase technology. Any transcriptional errors that result from this process are unintentional.

## 2021-11-16 LAB — COMPREHENSIVE METABOLIC PANEL
ALT: 16 IU/L (ref 0–32)
AST: 19 IU/L (ref 0–40)
Albumin/Globulin Ratio: 2 (ref 1.2–2.2)
Albumin: 5 g/dL — ABNORMAL HIGH (ref 3.8–4.8)
Alkaline Phosphatase: 132 IU/L — ABNORMAL HIGH (ref 44–121)
BUN/Creatinine Ratio: 22 (ref 12–28)
BUN: 19 mg/dL (ref 8–27)
Bilirubin Total: 0.2 mg/dL (ref 0.0–1.2)
CO2: 24 mmol/L (ref 20–29)
Calcium: 10.2 mg/dL (ref 8.7–10.3)
Chloride: 98 mmol/L (ref 96–106)
Creatinine, Ser: 0.85 mg/dL (ref 0.57–1.00)
Globulin, Total: 2.5 g/dL (ref 1.5–4.5)
Glucose: 91 mg/dL (ref 70–99)
Potassium: 5.1 mmol/L (ref 3.5–5.2)
Sodium: 137 mmol/L (ref 134–144)
Total Protein: 7.5 g/dL (ref 6.0–8.5)
eGFR: 76 mL/min/{1.73_m2} (ref >59)

## 2021-11-18 ENCOUNTER — Telehealth: Payer: Self-pay

## 2021-11-18 NOTE — Telephone Encounter (Signed)
Left vm for pt to return my call regarding lab result. MyChart message has been sent to this patient as well.

## 2021-11-18 NOTE — Telephone Encounter (Signed)
-----   Message from Lucilla Lame, MD sent at 11/17/2021  8:07 AM EST ----- Please let the patient know that her blood work showed that one of her liver enzymes call Dr. Edmonia James is high and this may be from the liver or can be high with bone issues.  The patient should have her GGT sent off, fractionation of her alkaline phosphatase and PTH

## 2021-11-23 LAB — ALKALINE PHOSPHATASE, ISOENZYMES
Alkaline Phosphatase: 132 IU/L — ABNORMAL HIGH (ref 44–121)
BONE FRACTION: 25 % (ref 14–68)
INTESTINAL FRAC.: 4 % (ref 0–18)
LIVER FRACTION: 71 % (ref 18–85)

## 2021-11-23 LAB — SPECIMEN STATUS REPORT

## 2021-11-23 LAB — GAMMA GT: GGT: 44 IU/L (ref 0–60)

## 2021-12-07 ENCOUNTER — Other Ambulatory Visit: Payer: Self-pay

## 2021-12-07 ENCOUNTER — Ambulatory Visit
Admission: RE | Admit: 2021-12-07 | Discharge: 2021-12-07 | Disposition: A | Discharge status: Home / Self Care | Payer: Medicare Other | Source: Ambulatory Visit | Attending: Gastroenterology | Admitting: Gastroenterology

## 2021-12-07 DIAGNOSIS — R1084 Generalized abdominal pain: Secondary | ICD-10-CM | POA: Diagnosis not present

## 2021-12-07 DIAGNOSIS — M419 Scoliosis, unspecified: Secondary | ICD-10-CM | POA: Diagnosis not present

## 2021-12-07 DIAGNOSIS — K7689 Other specified diseases of liver: Secondary | ICD-10-CM | POA: Diagnosis not present

## 2021-12-07 DIAGNOSIS — K5720 Diverticulitis of large intestine with perforation and abscess without bleeding: Secondary | ICD-10-CM | POA: Diagnosis not present

## 2021-12-07 DIAGNOSIS — K5730 Diverticulosis of large intestine without perforation or abscess without bleeding: Secondary | ICD-10-CM | POA: Diagnosis not present

## 2021-12-07 MED ORDER — IOHEXOL 300 MG/ML  SOLN
100.0000 mL | Freq: Once | INTRAMUSCULAR | Status: AC | PRN
  Administered 2021-12-07: 10:00:00 100 mL via INTRAVENOUS

## 2021-12-08 ENCOUNTER — Telehealth: Payer: Self-pay

## 2021-12-08 NOTE — Telephone Encounter (Signed)
-----   Message from Lucilla Lame, MD sent at 12/07/2021  4:36 PM EST ----- Please let the patient know that the CT scan showed no further inflammation of the diverticuli and the abscess has resolved.

## 2021-12-08 NOTE — Telephone Encounter (Signed)
Pt notified of CT scan through [place].

## 2021-12-14 ENCOUNTER — Other Ambulatory Visit: Payer: Self-pay | Admitting: Family

## 2021-12-14 DIAGNOSIS — F419 Anxiety disorder, unspecified: Secondary | ICD-10-CM

## 2021-12-15 NOTE — Telephone Encounter (Signed)
Last OV 08/31/21 last fill 11/14/21 okay to fill?

## 2021-12-21 ENCOUNTER — Ambulatory Visit
Admission: RE | Admit: 2021-12-21 | Discharge: 2021-12-21 | Disposition: A | Discharge status: Home / Self Care | Payer: Medicare Other | Source: Ambulatory Visit | Attending: Family | Admitting: Family

## 2021-12-21 ENCOUNTER — Other Ambulatory Visit: Payer: Self-pay

## 2021-12-21 DIAGNOSIS — Z1231 Encounter for screening mammogram for malignant neoplasm of breast: Secondary | ICD-10-CM | POA: Diagnosis not present

## 2021-12-21 NOTE — Telephone Encounter (Signed)
Mychart message sent.

## 2022-01-08 NOTE — Progress Notes (Signed)
Date:  01/09/2022   ID:  Deborah Koch, DOB May 15, 1956, MRN [id number]  Patient Location:  [place] [address]   Provider location:   [facility], [place] office  PCP:  Burnard Hawthorne, FNP  Cardiologist:  Arvid Right [facility]  Chief Complaint  Patient presents with   6 month follow up     "Doing well." Medications reviewed by the patient verbally.     History of Present Illness:    Deborah Koch is a 66 y.o. female  past medical history of bicuspid aortic valve, moderate aortic valve stenosis , mild -moderate TR 4.1 cm ascending aorta dilation, on echo 2015 not visualized well on echocardiogram since then hypertension,  GERD,  out on disability for scoliosis among other issues prior smoking history Anxiety Takes care of 34 year old mother who has Alzheimer's who presents for routine follow-up of her aortic valve stenosis, dilating ascending aorta s/p aortic surgery and prosthetic valve  Doing better mentally and physically on today's visit Has periodic sternal discomfort Discomfort from scoliosis  PTSD from surgery Has good days bad days, but in general doing much better Separated from husband, living with her sister  06/2021 echo, stable Data reviewed on today's visit  EKG personally reviewed by myself on todays visit Normal sinus rhythm rate 57 bpm PVC left axis deviation  Other past medical history reviewed Prior CTA of the chest showed a 4.7 cm fusiform ascending aortic aneurysm.  She was subsequently referred to thoracic surgery and on May 5, she underwent replacement of the ascending aorta and aortic arch with aorto-innominate and aorto-left carotid bypass with supra coronary anastomosis as well as aortic valve replacement.  hospitalizations  status post replacement of the ascending aorta and aortic arch with aorto-innominate and aorto-left carotid bypass with supra coronary anastomosis   aortic valve  replacement  rehospitalization with pleural effusions, pericardial effusion, and paroxysmal atrial fibrillation. moderate pericardial effusion and right greater than left pleural effusion.  Echocardiogram July 2022 1. Left ventricular ejection fraction, by estimation, is 55 to 60%. The  left ventricle has normal function.   2. There is no evidence of pericardial effusion.   3. The mitral valve is normal in structure. Mild mitral valve  regurgitation.   4. Tricuspid valve regurgitation is moderate.   5. The aortic valve has been repaired/replaced. There is a 23 mm Edwards  bovine valve present in the aortic position. Procedure Date: 04/2021.     Past Medical History:  Diagnosis Date   Anxiety    Ascending Aortic Aneurysm    a. 04/2021 s/p replacement of the ascending aorta and aortic arch with aorta-innominate and aorta-left carotid bypass with supra coronary anastomosis as well as AVR with 23 mm Edwards pericardial tissue valve.   C. difficile colitis    a. 07/2014.   Congenital bicuspid aortic valve    Depression    Diverticulitis    hospitalized; subsequent c diff infection.    GERD (gastroesophageal reflux disease)    H/O mastitis 2015   Heart murmur    History of cardiac catheterization    a. 03/2021 Nl cors.   History of tobacco abuse    a. 20 yrs, 1/4 ppd - quit.   HTN (hypertension)    Hypokalemia    Multilevel degenerative disc disease    PAF (paroxysmal atrial fibrillation) ([place])    a. 04/2021-->amio. No OAC 2/2 concern for hemopericardium (post-op Ao arch/AVR).   Pleural effusion, bilateral  a. 04/2021 s/p thoracic surgery.   Pneumonia    at age 48   Scoliosis    Severe aortic stenosis w/ congenital bicuspid AoV    a. 04/2021 s/p 33mm edwards pericardial tissue valve; b. 05/12/2021 Echo: EF 55-60%, no rwma, Gr2 DD, nl RV fxn, mild BAE, moderate pericardial effusion, mild MR, mild to mod TR, Nl fxn'ing AoV prosthesis.   Past Surgical History:  Procedure Laterality  Date   AORTA -INNOMIATE BYPASS N/A 04/21/2021   Procedure: AORTA -Titus Mould BYPASS;  Surgeon: Gaye Pollack, MD;  [facility];  Service: Vascular;  Laterality: N/A;   AORTIC VALVE REPLACEMENT N/A 04/21/2021   Procedure: AORTIC VALVE REPLACEMENT (AVR) ON PUMP WITH 23MM INSPIRIS AORTIC VALVE;  Surgeon: Gaye Pollack, MD;  Location: [place];  Service: Open Heart Surgery;  Laterality: N/A;   BARTHOLIN GLAND CYST EXCISION     BREAST BIOPSY Left    neg   CAROTID-SUBCLAVIAN BYPASS GRAFT Left 04/21/2021   Procedure: AORTA LEFT COMMON CAROTID BYPASS;  Surgeon: Gaye Pollack, MD;  Location: [place];  Service: Vascular;  Laterality: Left;   COLONOSCOPY  2013   Dr. Allen Norris   COLONOSCOPY WITH PROPOFOL N/A 10/23/2017   Procedure: COLONOSCOPY WITH PROPOFOL;  Surgeon: Lucilla Lame, MD;  [facility] END[facility]COPY;  Service: Endoscopy;  Laterality: N/A;   OOPHORECTOMY  2016   OVARY SURGERY  2016   cyst on right ovary   REPLACEMENT ASCENDING AORTA N/A 04/21/2021   Procedure: REPLACEMENT ASCENDING & ARCH AORTA WITH HEMASHIELD PLATINUM GRAFT 28 [id number];  Surgeon: Gaye Pollack, MD;  Location: [place];  Service: Open Heart Surgery;  Laterality: N/A;  CIRC ARREST  RIGHT AXILLARY ARTERY CANNULATION   RIGHT/LEFT HEART CATH AND CORONARY ANGIOGRAPHY N/A 03/24/2021   Procedure: RIGHT/LEFT HEART CATH AND CORONARY ANGIOGRAPHY;  Surgeon: Minna Merritts, MD;  Location: [place] CV LAB;  Service: Cardiovascular;  Laterality: N/A;   SALPINGECTOMY Bilateral    TEE WITHOUT CARDIOVERSION N/A 04/21/2021   Procedure: TRANSESOPHAGEAL ECHOCARDIOGRAM (TEE);  Surgeon: Gaye Pollack, MD;  Location: [place];  Service: Open Heart Surgery;  Laterality: N/A;      Allergies:   Hydrocodone   Social History   Tobacco Use   Smoking status: Former    Types: Cigarettes    Quit date: 04/20/2021    Years since quitting: 0.7   Smokeless tobacco: Never  Vaping Use   Vaping Use: Never used  Substance Use Topics   Alcohol use: Yes     Alcohol/week: 3.0 standard drinks    Types: 3 Glasses of wine per week    Comment:  2-3 glasses of wine every other evening   Drug use: No     Current Outpatient Medications on File Prior to Visit  Medication Sig Dispense Refill   clonazePAM (KLONOPIN) 0.5 MG tablet TAKE 1 TABLET (0.5 MG TOTAL) BY MOUTH 3 (THREE) TIMES DAILY AS NEEDED FOR ANXIETY. 90 tablet 0   escitalopram (LEXAPRO) 10 MG tablet TAKE 1 TABLET BY MOUTH EVERY DAY 90 tablet 1   feeding supplement (ENSURE ENLIVE / ENSURE PLUS) LIQD Take 237 mLs by mouth 3 (three) times daily between meals. 237 mL 12   furosemide (LASIX) 40 MG tablet Take 40 mg by mouth daily.     KLOR-CON M10 10 MEQ tablet TAKE 1 TABLET BY MOUTH 2 TIMES DAILY. 180 tablet 3   losartan (COZAAR) 25 MG tablet Take 1 tablet (25 mg total) by mouth daily. 90 tablet 3  metoprolol tartrate (LOPRESSOR) 50 MG tablet Take 50 mg by mouth 2 (two) times daily.     Current Facility-Administered Medications on File Prior to Visit  Medication Dose Route Frequency Provider Last Rate Last Admin   sodium chloride flush (NS) 0.9 % injection 3 mL  3 mL Intravenous Q12H Visser, Jacquelyn D, PA-C         Family Hx: The patient's family history includes Alcohol abuse in her brother; Anxiety disorder in her mother and sister; Breast cancer in her maternal aunt; Colon cancer in her mother; Depression in her mother and sister; Heart failure in her father; Hypertension in her father and mother; Schizophrenia in her brother. There is no history of Heart attack or Stroke.  ROS:   Please see the history of present illness.    Review of Systems  Constitutional: Negative.   HENT: Negative.    Respiratory: Negative.    Cardiovascular: Negative.   Gastrointestinal: Negative.   Musculoskeletal: Negative.   Neurological: Negative.   Psychiatric/Behavioral:  The patient is nervous/anxious.   All other systems reviewed and are negative.    Labs/Other Tests and Data Reviewed:    Recent  Labs: 05/02/2021: B Natriuretic Peptide 823.2 05/08/2021: Magnesium 2.0 08/31/2021: Hemoglobin 11.8; Platelets 291.0; TSH 9.72 11/15/2021: ALT 16; BUN 19; Creatinine, Ser 0.85; Potassium 5.1; Sodium 137   Recent Lipid Panel Lab Results  Component Value Date/Time   CHOL 85 05/05/2021 12:47 PM   TRIG 57.0 02/08/2021 09:59 AM   HDL 93.50 02/08/2021 09:59 AM   CHOLHDL 2 02/08/2021 09:59 AM   LDLCALC 82 02/08/2021 09:59 AM    Wt Readings from Last 3 Encounters:  01/09/22 136 lb 2 oz (61.7 kg)  11/15/21 128 lb (58.1 kg)  08/31/21 121 lb 9.6 oz (55.2 kg)     Exam:    BP 128/80 (BP Location: Left Arm, Patient Position: Sitting, Cuff Size: Normal)    Pulse (!) 57    Ht 5\' 5"  (1.651 m)    Wt 136 lb 2 oz (61.7 kg)    SpO2 99%    BMI 22.65 kg/m   Constitutional:  oriented to person, place, and time. No distress.  HENT:  Head: Grossly normal Eyes:  no discharge. No scleral icterus.  Neck: No JVD, no carotid bruits  Cardiovascular: Regular rate and rhythm, no murmurs appreciated Pulmonary/Chest: Clear to auscultation bilaterally, no wheezes or rails Abdominal: Soft.  no distension.  no tenderness.  Musculoskeletal: Normal range of motion Neurological:  normal muscle tone. Coordination normal. No atrophy Skin: Skin warm and dry Psychiatric: normal affect, pleasant  ASSESSMENT & PLAN:    Problem List Items Addressed This Visit       Cardiology Problems   Congenital bicuspid aortic valve   Ascending aorta dilation (HCC)   Atherosclerosis of aorta (HCC)     Other   S/P ascending aortic aneurysm repair   Relevant Orders   EKG 12-Lead   Other Visit Diagnoses     PAF (paroxysmal atrial fibrillation) ([place])    -  Primary   Relevant Orders   EKG 12-Lead   Severe aortic stenosis       Relevant Orders   EKG 12-Lead   Essential hypertension       Relevant Orders   EKG 12-Lead   Pericardial effusion       S/P aortic valve replacement with bioprosthetic valve         Aortic  stenosis S/p surgery,long recovery Aorta dilation surgery, Overall feeling well,  active, doing low grade exercise/shopping  Dilated ascending aorta Underwent surgery 2022, has made full recovery Will need periodic imaging with echo/CT  Hypertension Blood pressure is well controlled on today's visit. No changes made to the medications.  Depression Reassurance provided, doing well, she does report having occasional PTSD issues from surgery  Chronic diastolic CHF Will continue Lasix for now with potassium down to 10 mequivalents daily Appears euvolemic   Total encounter time more than 25 minutes  Greater than 50% was spent in counseling and coordination of care with the patient    Signed, Ida Rogue, [id number], [place], [place] [address]

## 2022-01-09 ENCOUNTER — Other Ambulatory Visit: Payer: Self-pay

## 2022-01-09 ENCOUNTER — Ambulatory Visit: Payer: Medicare Other | Admitting: Cardiovascular Disease

## 2022-01-09 ENCOUNTER — Encounter: Payer: Self-pay | Admitting: Cardiovascular Disease

## 2022-01-09 VITALS — BP 128/80 | HR 57 | Ht 65.0 in | Wt 136.1 lb

## 2022-01-09 DIAGNOSIS — I3139 Other pericardial effusion (noninflammatory): Secondary | ICD-10-CM

## 2022-01-09 DIAGNOSIS — I700 Atherosclerosis of aorta: Secondary | ICD-10-CM

## 2022-01-09 DIAGNOSIS — Q231 Congenital insufficiency of aortic valve: Secondary | ICD-10-CM | POA: Diagnosis not present

## 2022-01-09 DIAGNOSIS — I480 Paroxysmal atrial fibrillation: Secondary | ICD-10-CM | POA: Diagnosis not present

## 2022-01-09 DIAGNOSIS — I350 Nonrheumatic aortic (valve) stenosis: Secondary | ICD-10-CM | POA: Diagnosis not present

## 2022-01-09 DIAGNOSIS — I10 Essential (primary) hypertension: Secondary | ICD-10-CM

## 2022-01-09 DIAGNOSIS — Z8679 Personal history of other diseases of the circulatory system: Secondary | ICD-10-CM

## 2022-01-09 DIAGNOSIS — I77810 Thoracic aortic ectasia: Secondary | ICD-10-CM

## 2022-01-09 DIAGNOSIS — Z9889 Other specified postprocedural states: Secondary | ICD-10-CM

## 2022-01-09 DIAGNOSIS — Z953 Presence of xenogenic heart valve: Secondary | ICD-10-CM | POA: Diagnosis not present

## 2022-01-09 DIAGNOSIS — Q2381 Bicuspid aortic valve: Secondary | ICD-10-CM

## 2022-01-09 MED ORDER — AMOXICILLIN 500 MG PO CAPS: 2000.0000 mg | ORAL_CAPSULE | Freq: Once | ORAL | 0 refills | Status: AC

## 2022-01-09 MED ORDER — POTASSIUM CHLORIDE CRYS ER 10 MEQ PO TBCR: 10.0000 meq | EXTENDED_RELEASE_TABLET | Freq: Every day | ORAL | Status: DC

## 2022-01-09 NOTE — Patient Instructions (Addendum)
Medication Instructions:  - Your physician has recommended you make the following change in your medication:   1) DECREASE potassium 10 meq: - take 1 tablet by mouth once daily    If you need a refill on your cardiac medications before your next appointment, please call your pharmacy.   Lab work: No new labs needed  Testing/Procedures: No new testing needed  Follow-Up: At [facility], you and your health needs are our priority.  As part of our continuing mission to provide you with exceptional heart care, we have created designated Provider Care Teams.  These Care Teams include your primary Cardiologist (physician) and Advanced Practice Providers (APPs -  Physician Assistants and Nurse Practitioners) who all work together to provide you with the care you need, when you need it.  You will need a follow up appointment in 12 months  Providers on your designated Care Team:   Murray Hodgkins, NP Christell Faith, PA-C Cadence Kathlen Mody, [place]  COVID-19 Vaccine Information can be found at: [email or web address].uk For questions related to vaccine distribution or appointments, please email [email or web address] or call [phone].

## 2022-01-23 ENCOUNTER — Other Ambulatory Visit: Payer: Self-pay

## 2022-01-23 ENCOUNTER — Encounter: Payer: Self-pay | Admitting: Family

## 2022-01-23 ENCOUNTER — Ambulatory Visit (INDEPENDENT_AMBULATORY_CARE_PROVIDER_SITE_OTHER): Payer: Medicare Other | Admitting: Family

## 2022-01-23 DIAGNOSIS — I10 Essential (primary) hypertension: Secondary | ICD-10-CM | POA: Diagnosis not present

## 2022-01-23 DIAGNOSIS — F419 Anxiety disorder, unspecified: Secondary | ICD-10-CM | POA: Diagnosis not present

## 2022-01-23 DIAGNOSIS — F32A Depression, unspecified: Secondary | ICD-10-CM | POA: Diagnosis not present

## 2022-01-23 NOTE — Progress Notes (Signed)
Subjective:    Patient ID: Deborah Koch, female    DOB: 1956-12-18, 66 y.o.   MRN: [id number]  CC: Deborah Koch is a 66 y.o. female who presents today for follow up.   HPI: She is feeling well. No complaints  She is walking again, half a mile a couple of times per week. No sob, cp,   HTN-compliant with Lopressor 50 mg, losartan 25 mg  Anxiety and depression-compliant with Lexapro 10 mg, Klonopin 0.5 mg TID prn however using more often BID.  She feels well on this dose  follow-up with Dr. Rockey Situ 01/09/2022 paroxysmal atrial fibrillation, chronic diastolic CHF, and history of aortic stenosis status postsurgery.  No medication changes made  CT abdomen and pelvis showed resolution of acute sigmoid diverticulitis and small diverticular abscess since prior study 12/07/21 Plan to do colonoscopy this year with Dr Allen Norris  Mammogram UTD   HISTORY:  Past Medical History:  Diagnosis Date   Anxiety    Ascending Aortic Aneurysm    a. 04/2021 s/p replacement of the ascending aorta and aortic arch with aorta-innominate and aorta-left carotid bypass with supra coronary anastomosis as well as AVR with 23 mm Edwards pericardial tissue valve.   C. difficile colitis    a. 07/2014.   Congenital bicuspid aortic valve    Depression    Diverticulitis    hospitalized; subsequent c diff infection.    GERD (gastroesophageal reflux disease)    H/O mastitis 2015   Heart murmur    History of cardiac catheterization    a. 03/2021 Nl cors.   History of tobacco abuse    a. 20 yrs, 1/4 ppd - quit.   HTN (hypertension)    Hypokalemia    Multilevel degenerative disc disease    PAF (paroxysmal atrial fibrillation) ([place])    a. 04/2021-->amio. No OAC 2/2 concern for hemopericardium (post-op Ao arch/AVR).   Pleural effusion, bilateral    a. 04/2021 s/p thoracic surgery.   Pneumonia    at age 33   Scoliosis    Severe aortic stenosis w/ congenital bicuspid AoV    a. 04/2021 s/p 29mm edwards  pericardial tissue valve; b. 05/12/2021 Echo: EF 55-60%, no rwma, Gr2 DD, nl RV fxn, mild BAE, moderate pericardial effusion, mild MR, mild to mod TR, Nl fxn'ing AoV prosthesis.   Past Surgical History:  Procedure Laterality Date   AORTA -INNOMIATE BYPASS N/A 04/21/2021   Procedure: AORTA -Titus Mould BYPASS;  Surgeon: Gaye Pollack, MD;  [facility];  Service: Vascular;  Laterality: N/A;   AORTIC VALVE REPLACEMENT N/A 04/21/2021   Procedure: AORTIC VALVE REPLACEMENT (AVR) ON PUMP WITH 23MM INSPIRIS AORTIC VALVE;  Surgeon: Gaye Pollack, MD;  Location: [place];  Service: Open Heart Surgery;  Laterality: N/A;   BARTHOLIN GLAND CYST EXCISION     BREAST BIOPSY Left    neg   CAROTID-SUBCLAVIAN BYPASS GRAFT Left 04/21/2021   Procedure: AORTA LEFT COMMON CAROTID BYPASS;  Surgeon: Gaye Pollack, MD;  Location: [place];  Service: Vascular;  Laterality: Left;   COLONOSCOPY  2013   Dr. Allen Norris   COLONOSCOPY WITH PROPOFOL N/A 10/23/2017   Procedure: COLONOSCOPY WITH PROPOFOL;  Surgeon: Lucilla Lame, MD;  Location[facility] END[facility]COPY;  Service: Endoscopy;  Laterality: N/A;   OOPHORECTOMY  2016   OVARY SURGERY  2016   cyst on right ovary   REPLACEMENT ASCENDING AORTA N/A 04/21/2021   Procedure: REPLACEMENT ASCENDING & ARCH AORTA WITH HEMASHIELD PLATINUM GRAFT 28 [id number];  Surgeon:  Gaye Pollack, MD;  Location: [facility] OR;  Service: Open Heart Surgery;  Laterality: N/A;  CIRC ARREST  RIGHT AXILLARY ARTERY CANNULATION   RIGHT/LEFT HEART CATH AND CORONARY ANGIOGRAPHY N/A 03/24/2021   Procedure: RIGHT/LEFT HEART CATH AND CORONARY ANGIOGRAPHY;  Surgeon: Minna Merritts, MD;  Location: [place] CV LAB;  Service: Cardiovascular;  Laterality: N/A;   SALPINGECTOMY Bilateral    TEE WITHOUT CARDIOVERSION N/A 04/21/2021   Procedure: TRANSESOPHAGEAL ECHOCARDIOGRAM (TEE);  Surgeon: Gaye Pollack, MD;  Location: [place];  Service: Open Heart Surgery;  Laterality: N/A;   Family History  Problem Relation Age of Onset    Hypertension Mother    Colon cancer Mother    Anxiety disorder Mother    Depression Mother    Heart failure Father    Hypertension Father    Depression Sister    Anxiety disorder Sister    Alcohol abuse Brother    Schizophrenia Brother    Breast cancer Maternal Aunt    Heart attack Neg Hx    Stroke Neg Hx     Allergies: Hydrocodone Current Outpatient Medications on File Prior to Visit  Medication Sig Dispense Refill   clonazePAM (KLONOPIN) 0.5 MG tablet TAKE 1 TABLET (0.5 MG TOTAL) BY MOUTH 3 (THREE) TIMES DAILY AS NEEDED FOR ANXIETY. 90 tablet 0   escitalopram (LEXAPRO) 10 MG tablet TAKE 1 TABLET BY MOUTH EVERY DAY 90 tablet 1   feeding supplement (ENSURE ENLIVE / ENSURE PLUS) LIQD Take 237 mLs by mouth 3 (three) times daily between meals. 237 mL 12   furosemide (LASIX) 40 MG tablet Take 40 mg by mouth daily.     metoprolol tartrate (LOPRESSOR) 50 MG tablet Take 50 mg by mouth 2 (two) times daily.     potassium chloride (KLOR-CON M10) 10 MEQ tablet Take 1 tablet (10 mEq total) by mouth daily.     losartan (COZAAR) 25 MG tablet Take 1 tablet (25 mg total) by mouth daily. 90 tablet 3   Current Facility-Administered Medications on File Prior to Visit  Medication Dose Route Frequency Provider Last Rate Last Admin   sodium chloride flush (NS) 0.9 % injection 3 mL  3 mL Intravenous Q12H Marrianne Mood D, PA-C        Social History   Tobacco Use   Smoking status: Former    Types: Cigarettes    Quit date: 04/20/2021    Years since quitting: 0.7   Smokeless tobacco: Never  Vaping Use   Vaping Use: Never used  Substance Use Topics   Alcohol use: Yes    Alcohol/week: 3.0 standard drinks    Types: 3 Glasses of wine per week    Comment:  2-3 glasses of wine every other evening   Drug use: No    Review of Systems  Constitutional:  Negative for chills and fever.  Respiratory:  Negative for cough and shortness of breath.   Cardiovascular:  Negative for chest pain, palpitations  and leg swelling.  Gastrointestinal:  Negative for nausea and vomiting.  Psychiatric/Behavioral:  The patient is not nervous/anxious.      Objective:    BP 120/68 (BP Location: Right Arm, Patient Position: Sitting, Cuff Size: Small)    Pulse 65    Temp 98.3 F (36.8 C) (Oral)    Wt 133 lb 12.8 oz (60.7 kg)    SpO2 96%    BMI 22.27 kg/m  BP Readings from Last 3 Encounters:  01/23/22 120/68  01/09/22 128/80  11/15/21 (!) 155/79  Wt Readings from Last 3 Encounters:  01/23/22 133 lb 12.8 oz (60.7 kg)  01/09/22 136 lb 2 oz (61.7 kg)  11/15/21 128 lb (58.1 kg)    Physical Exam Vitals reviewed.  Constitutional:      Appearance: She is well-developed.  Eyes:     Conjunctiva/sclera: Conjunctivae normal.  Cardiovascular:     Rate and Rhythm: Normal rate and regular rhythm.     Pulses: Normal pulses.     Heart sounds: Normal heart sounds.  Pulmonary:     Effort: Pulmonary effort is normal.     Breath sounds: Normal breath sounds. No wheezing, rhonchi or rales.  Musculoskeletal:     Right lower leg: No edema.     Left lower leg: No edema.  Skin:    General: Skin is warm and dry.  Neurological:     Mental Status: She is alert.  Psychiatric:        Speech: Speech normal.        Behavior: Behavior normal.        Thought Content: Thought content normal.       Assessment & Plan:   Problem List Items Addressed This Visit       Cardiovascular and Mediastinum   HTN (hypertension)    Chronic, stable. Continue Lopressor 50 mg, losartan 25 mg        Other   Anxiety and depression    Chronic, stable.  Continue Lexapro 10 mg, Klonopin 0.5 mg TID prn        I am having [name]. Marzetta Board "[name]" maintain her losartan, furosemide, metoprolol tartrate, feeding supplement, escitalopram, clonazePAM, and potassium chloride.   No orders of the defined types were placed in this encounter.   Return precautions given.   Risks, benefits, and alternatives of the medications and  treatment plan prescribed today were discussed, and patient expressed understanding.   Education regarding symptom management and diagnosis given to patient on AVS.  Continue to follow with Burnard Hawthorne, FNP for routine health maintenance.   [name] and I agreed with plan.   Mable Paris, FNP

## 2022-01-23 NOTE — Assessment & Plan Note (Signed)
Chronic, stable. Continue Lopressor 50 mg, losartan 25 mg

## 2022-01-23 NOTE — Assessment & Plan Note (Signed)
Chronic, stable.  Continue Lexapro 10 mg, Klonopin 0.5 mg TID prn. 

## 2022-02-01 ENCOUNTER — Other Ambulatory Visit: Payer: Self-pay | Admitting: Family

## 2022-02-01 DIAGNOSIS — F32A Depression, unspecified: Secondary | ICD-10-CM

## 2022-02-03 ENCOUNTER — Ambulatory Visit: Payer: Medicare Other | Admitting: Family

## 2022-02-28 ENCOUNTER — Other Ambulatory Visit: Payer: Self-pay | Admitting: Nurse Practitioner

## 2022-03-06 ENCOUNTER — Telehealth: Payer: Self-pay

## 2022-03-06 ENCOUNTER — Ambulatory Visit: Payer: Medicare Other

## 2022-03-06 NOTE — Telephone Encounter (Signed)
Lvm to inform pt that AWV has been cancelled due to [place] being floated to another office. As well as she would be reaching out at a later date to R/S appt.  ?

## 2022-05-09 ENCOUNTER — Other Ambulatory Visit: Payer: Self-pay | Admitting: Family

## 2022-05-09 DIAGNOSIS — F32A Depression, unspecified: Secondary | ICD-10-CM

## 2022-05-10 DIAGNOSIS — L720 Epidermal cyst: Secondary | ICD-10-CM | POA: Diagnosis not present

## 2022-05-10 DIAGNOSIS — L738 Other specified follicular disorders: Secondary | ICD-10-CM | POA: Diagnosis not present

## 2022-05-10 DIAGNOSIS — I788 Other diseases of capillaries: Secondary | ICD-10-CM | POA: Diagnosis not present

## 2022-05-29 ENCOUNTER — Other Ambulatory Visit: Payer: Self-pay | Admitting: Nurse Practitioner

## 2022-05-31 ENCOUNTER — Encounter: Payer: Self-pay | Admitting: Family

## 2022-05-31 ENCOUNTER — Other Ambulatory Visit: Payer: Self-pay

## 2022-05-31 DIAGNOSIS — Z13820 Encounter for screening for osteoporosis: Secondary | ICD-10-CM

## 2022-06-19 ENCOUNTER — Other Ambulatory Visit: Payer: Self-pay | Admitting: Family

## 2022-06-19 DIAGNOSIS — F419 Anxiety disorder, unspecified: Secondary | ICD-10-CM

## 2022-07-18 ENCOUNTER — Other Ambulatory Visit: Payer: Self-pay

## 2022-07-18 ENCOUNTER — Telehealth: Payer: Self-pay

## 2022-07-18 DIAGNOSIS — Z86010 Personal history of colonic polyps: Secondary | ICD-10-CM

## 2022-07-18 NOTE — Progress Notes (Signed)
Gastroenterology Pre-Procedure Review  Request Date: 10/31/22 Requesting Physician: Dr. Allen Norris  PATIENT REVIEW QUESTIONS: The patient responded to the following health history questions as indicated:    1. Are you having any GI issues? no 2. Do you have a personal history of Polyps? yes (10/23/17 performed by Dr. Allen Norris) 3. Do you have a family history of Colon Cancer or Polyps? no 4. Diabetes Mellitus? no 5. Joint replacements in the past 12 months?no 6. Major health problems in the past 3 months? May 5th 20222 heart surgery Dr. Rockey Situ 7. Any artificial heart valves, MVP, or defibrillator?no    MEDICATIONS & ALLERGIES:    Patient reports the following regarding taking any anticoagulation/antiplatelet therapy:   Plavix, Coumadin, Eliquis, Xarelto, Lovenox, Pradaxa, Brilinta, or Effient? no Aspirin? no  Patient confirms/reports the following medications:  Current Outpatient Medications  Medication Sig Dispense Refill   clonazePAM (KLONOPIN) 0.5 MG tablet TAKE 1 TABLET (0.5 MG TOTAL) BY MOUTH 3 (THREE) TIMES DAILY AS NEEDED FOR ANXIETY. 90 tablet 0   escitalopram (LEXAPRO) 10 MG tablet TAKE 1 TABLET BY MOUTH EVERY DAY 90 tablet 1   feeding supplement (ENSURE ENLIVE / ENSURE PLUS) LIQD Take 237 mLs by mouth 3 (three) times daily between meals. 237 mL 12   furosemide (LASIX) 40 MG tablet TAKE 1 TABLET BY MOUTH EVERY DAY 90 tablet 2   losartan (COZAAR) 25 MG tablet TAKE 1 TABLET (25 MG TOTAL) BY MOUTH DAILY. 90 tablet 2   metoprolol tartrate (LOPRESSOR) 50 MG tablet TAKE 1 TABLET BY MOUTH TWICE A DAY 180 tablet 1   potassium chloride (KLOR-CON M10) 10 MEQ tablet Take 1 tablet (10 mEq total) by mouth daily.     No current facility-administered medications for this visit.   Facility-Administered Medications Ordered in Other Visits  Medication Dose Route Frequency Provider Last Rate Last Admin   sodium chloride flush (NS) 0.9 % injection 3 mL  3 mL Intravenous Q12H Marrianne Mood D, PA-C         Patient confirms/reports the following allergies:  Allergies  Allergen Reactions   Hydrocodone Hives and Rash    No orders of the defined types were placed in this encounter.   AUTHORIZATION INFORMATION Primary Insurance: 1D#: Group #:  Secondary Insurance: 1D#: Group #:  SCHEDULE INFORMATION: Date: 10/31/22 Time: Location: [facility]

## 2022-07-18 NOTE — Telephone Encounter (Signed)
Gastroenterology Pre-Procedure Review   Request Date: 10/31/22 Requesting Physician: Dr. Allen Norris   PATIENT REVIEW QUESTIONS: The patient responded to the following health history questions as indicated:     1. Are you having any GI issues? no 2. Do you have a personal history of Polyps? yes (10/23/17 performed by Dr. Allen Norris) 3. Do you have a family history of Colon Cancer or Polyps? no 4. Diabetes Mellitus? no 5. Joint replacements in the past 12 months?no 6. Major health problems in the past 3 months? May 5th 2022 heart surgery Dr. Rockey Situ 7. Any artificial heart valves, MVP, or defibrillator?no    MEDICATIONS & ALLERGIES:    Patient reports the following regarding taking any anticoagulation/antiplatelet therapy:   Plavix, Coumadin, Eliquis, Xarelto, Lovenox, Pradaxa, Brilinta, or Effient? no Aspirin? no   Patient confirms/reports the following medications:        Current Outpatient Medications  Medication Sig Dispense Refill   clonazePAM (KLONOPIN) 0.5 MG tablet TAKE 1 TABLET (0.5 MG TOTAL) BY MOUTH 3 (THREE) TIMES DAILY AS NEEDED FOR ANXIETY. 90 tablet 0   escitalopram (LEXAPRO) 10 MG tablet TAKE 1 TABLET BY MOUTH EVERY DAY 90 tablet 1   feeding supplement (ENSURE ENLIVE / ENSURE PLUS) LIQD Take 237 mLs by mouth 3 (three) times daily between meals. 237 mL 12   furosemide (LASIX) 40 MG tablet TAKE 1 TABLET BY MOUTH EVERY DAY 90 tablet 2   losartan (COZAAR) 25 MG tablet TAKE 1 TABLET (25 MG TOTAL) BY MOUTH DAILY. 90 tablet 2   metoprolol tartrate (LOPRESSOR) 50 MG tablet TAKE 1 TABLET BY MOUTH TWICE A DAY 180 tablet 1   potassium chloride (KLOR-CON M10) 10 MEQ tablet Take 1 tablet (10 mEq total) by mouth daily.        No current facility-administered medications for this visit.             Facility-Administered Medications Ordered in Other Visits  Medication Dose Route Frequency Provider Last Rate Last Admin   sodium chloride flush (NS) 0.9 % injection 3 mL  3 mL Intravenous Q12H  Marrianne Mood D, PA-C          Patient confirms/reports the following allergies:      Allergies  Allergen Reactions   Hydrocodone Hives and Rash      No orders of the defined types were placed in this encounter.

## 2022-07-20 ENCOUNTER — Ambulatory Visit
Admission: RE | Admit: 2022-07-20 | Discharge: 2022-07-20 | Disposition: A | Discharge status: Home / Self Care | Payer: Medicare Other | Source: Ambulatory Visit | Attending: Family | Admitting: Family

## 2022-07-20 DIAGNOSIS — Z780 Asymptomatic menopausal state: Secondary | ICD-10-CM | POA: Insufficient documentation

## 2022-07-20 DIAGNOSIS — Z13820 Encounter for screening for osteoporosis: Secondary | ICD-10-CM | POA: Insufficient documentation

## 2022-07-20 DIAGNOSIS — M810 Age-related osteoporosis without current pathological fracture: Secondary | ICD-10-CM | POA: Insufficient documentation

## 2022-07-21 ENCOUNTER — Encounter: Payer: Self-pay | Admitting: Family

## 2022-07-21 DIAGNOSIS — M810 Age-related osteoporosis without current pathological fracture: Secondary | ICD-10-CM | POA: Insufficient documentation

## 2022-07-26 ENCOUNTER — Telehealth: Payer: Self-pay | Admitting: Family

## 2022-07-26 DIAGNOSIS — F419 Anxiety disorder, unspecified: Secondary | ICD-10-CM

## 2022-07-26 MED ORDER — CLONAZEPAM 0.5 MG PO TABS: 0.5000 mg | ORAL_TABLET | Freq: Three times a day (TID) | ORAL | 2 refills | Status: DC | PRN

## 2022-07-26 NOTE — Telephone Encounter (Signed)
Call pt I have refilled klonopin  I looked up patient on  Controlled Substances Reporting System PMP AWARE and saw no activity that raised concern of inappropriate use.

## 2022-07-27 NOTE — Telephone Encounter (Signed)
Called patient and informed her that her Klonopin had been refilled

## 2022-09-11 ENCOUNTER — Telehealth: Payer: Self-pay | Admitting: Cardiovascular Disease

## 2022-09-11 NOTE — Telephone Encounter (Signed)
   Pre-operative Risk Assessment    Patient Name: Deborah Koch  DOB: 09/09/1956 MRN: [id number]      Request for Surgical Clearance    Procedure:   COLONOSCOPY  Date of Surgery:  Clearance 10/31/22                               Surgeon:  not indicated Surgeon's Group or Practice Name:  [facility] Phone number:  [phone] Fax number:  [phone]  Type of Clearance Requested:   - Medical    Type of Anesthesia:  General    Additional requests/questions:    SignedEli Phillips   09/11/2022, 2:48 PM

## 2022-09-12 NOTE — Telephone Encounter (Signed)
   Name: Cyana Shook  DOB: [birth year]  MRN: [id number]  Primary Cardiologist: Ida Rogue, MD  Chart reviewed as part of pre-operative protocol coverage. Because of Wells Gerdeman Leinberger's past medical history and time since last visit, she will require a follow-up virtual visit in order to better assess preoperative cardiovascular risk.  Pre-op covering staff: - Please schedule appointment and call patient to inform them. If patient already had an upcoming appointment within acceptable timeframe, please add "pre-op clearance" to the appointment notes so provider is aware. - Please contact requesting surgeon's office via preferred method (i.e, phone, fax) to inform them of need for appointment prior to surgery.  Loel Dubonnet, NP  09/12/2022, 3:16 PM

## 2022-09-13 NOTE — Telephone Encounter (Signed)
I left a message for the patient to call office back to schedule a virtual appointment for pre-op.

## 2022-09-18 ENCOUNTER — Other Ambulatory Visit: Payer: Self-pay | Admitting: Cardiovascular Disease

## 2022-09-18 NOTE — Telephone Encounter (Signed)
2nd attempt to reach pt regarding surgical clearance and the need for a tele visit.  Left another message for pt to call back and ask for the preop team.

## 2022-09-19 ENCOUNTER — Telehealth: Payer: Self-pay

## 2022-09-19 ENCOUNTER — Telehealth: Payer: Self-pay | Admitting: Gastroenterology

## 2022-09-19 NOTE — Telephone Encounter (Signed)
Patient stated that she has just had her surgery 18 months ago and she is not up for another procedure at this time.  She has requested to cancel her  10/31/22 colonoscopy with Dr. Allen Norris at this time but plans to call back next year to reschedule.  Thanks,  [place], [place]

## 2022-09-19 NOTE — Telephone Encounter (Signed)
I s/w the pt and she tells me that she is going to hold off for maybe another 6 months or so for her colonoscopy. She states she is just not ready to have another procedure. She tells me that she had her heart surgery 18 months ago and really juts does not want to do anything at this time.  She will have the requesting office send new clearance when she is ready. Pt thanked me for the call today and the help. I will update the requesting office as well.

## 2022-09-19 NOTE — Telephone Encounter (Signed)
Pt would like to cancel procedure for 10/21/2022 will call back later to resched

## 2022-09-21 ENCOUNTER — Other Ambulatory Visit: Payer: Self-pay | Admitting: Cardiovascular Disease

## 2022-10-15 ENCOUNTER — Other Ambulatory Visit: Payer: Self-pay | Admitting: Family

## 2022-10-15 DIAGNOSIS — F32A Depression, unspecified: Secondary | ICD-10-CM

## 2022-10-31 ENCOUNTER — Ambulatory Visit: Admit: 2022-10-31 | Payer: Medicare Other | Admitting: Gastroenterology

## 2022-10-31 SURGERY — COLONOSCOPY WITH PROPOFOL
Anesthesia: General

## 2022-11-04 ENCOUNTER — Other Ambulatory Visit: Payer: Self-pay | Admitting: Cardiovascular Disease

## 2022-11-12 DIAGNOSIS — R051 Acute cough: Secondary | ICD-10-CM | POA: Diagnosis not present

## 2022-11-23 ENCOUNTER — Telehealth: Payer: Self-pay | Admitting: Family

## 2022-11-23 NOTE — Telephone Encounter (Signed)
Pt would like to be called in regards to her cough and her visit at [facility] in [place]. Pt had x rays taken on Nov. 26 and they found nothing. Pt would like to be called

## 2022-11-23 NOTE — Telephone Encounter (Signed)
Spoke to pt and scheduled her an ov to come in for lingering cough

## 2022-11-26 ENCOUNTER — Other Ambulatory Visit: Payer: Self-pay | Admitting: Cardiovascular Disease

## 2022-11-28 ENCOUNTER — Ambulatory Visit: Payer: Medicare Other | Admitting: Family

## 2022-12-13 IMAGING — CR DG CHEST 2V
2 series · 2 of 2 positions shown · non-contrast
Comparison: Chest x-ray 04/23/2021, CT chest 04/11/2021, chest
x-ray 04/19/2021.

CLINICAL DATA: Atelectasis.  Postop cardiac surgery.

EXAM:
CHEST - 2 VIEW

[chest lat]
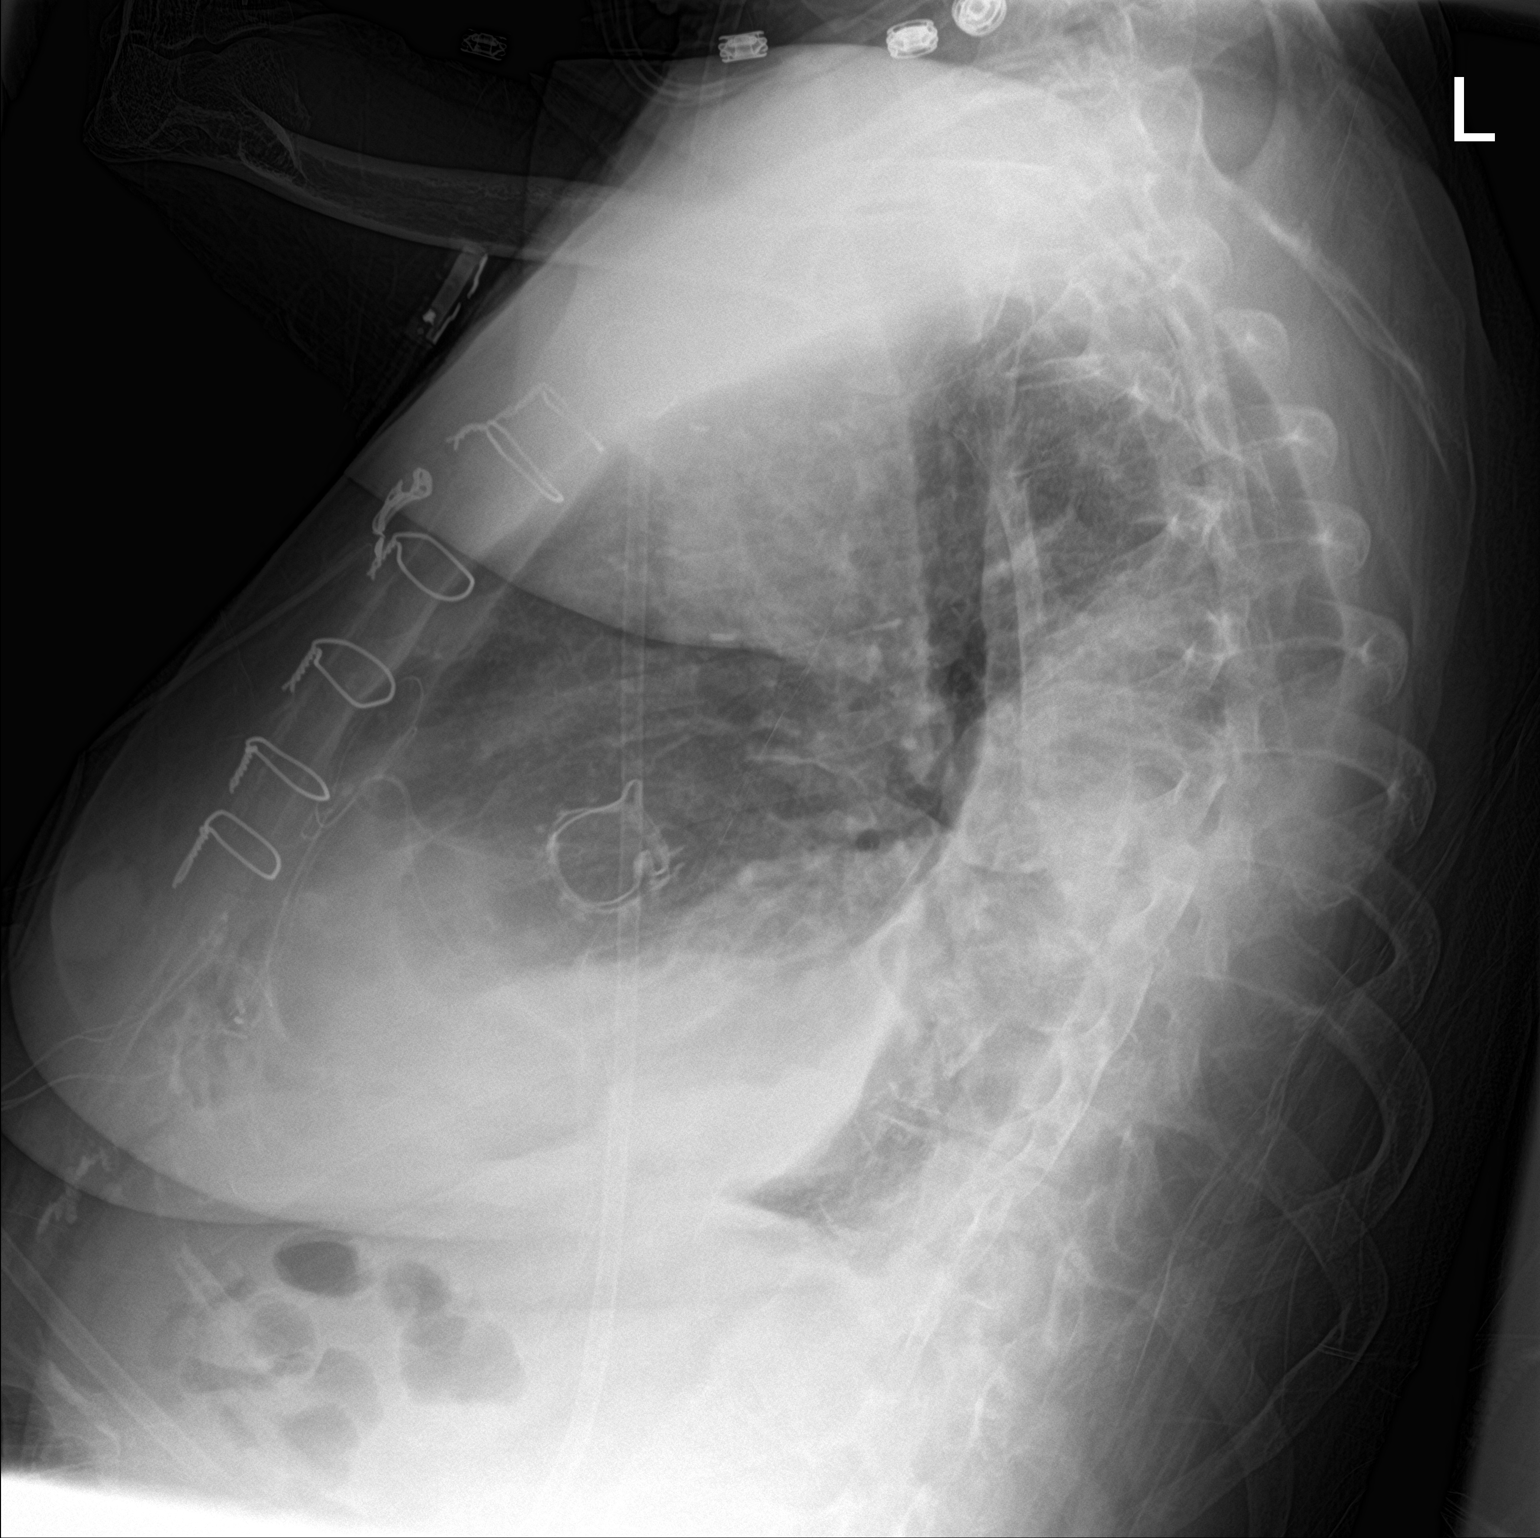

[chest ap]
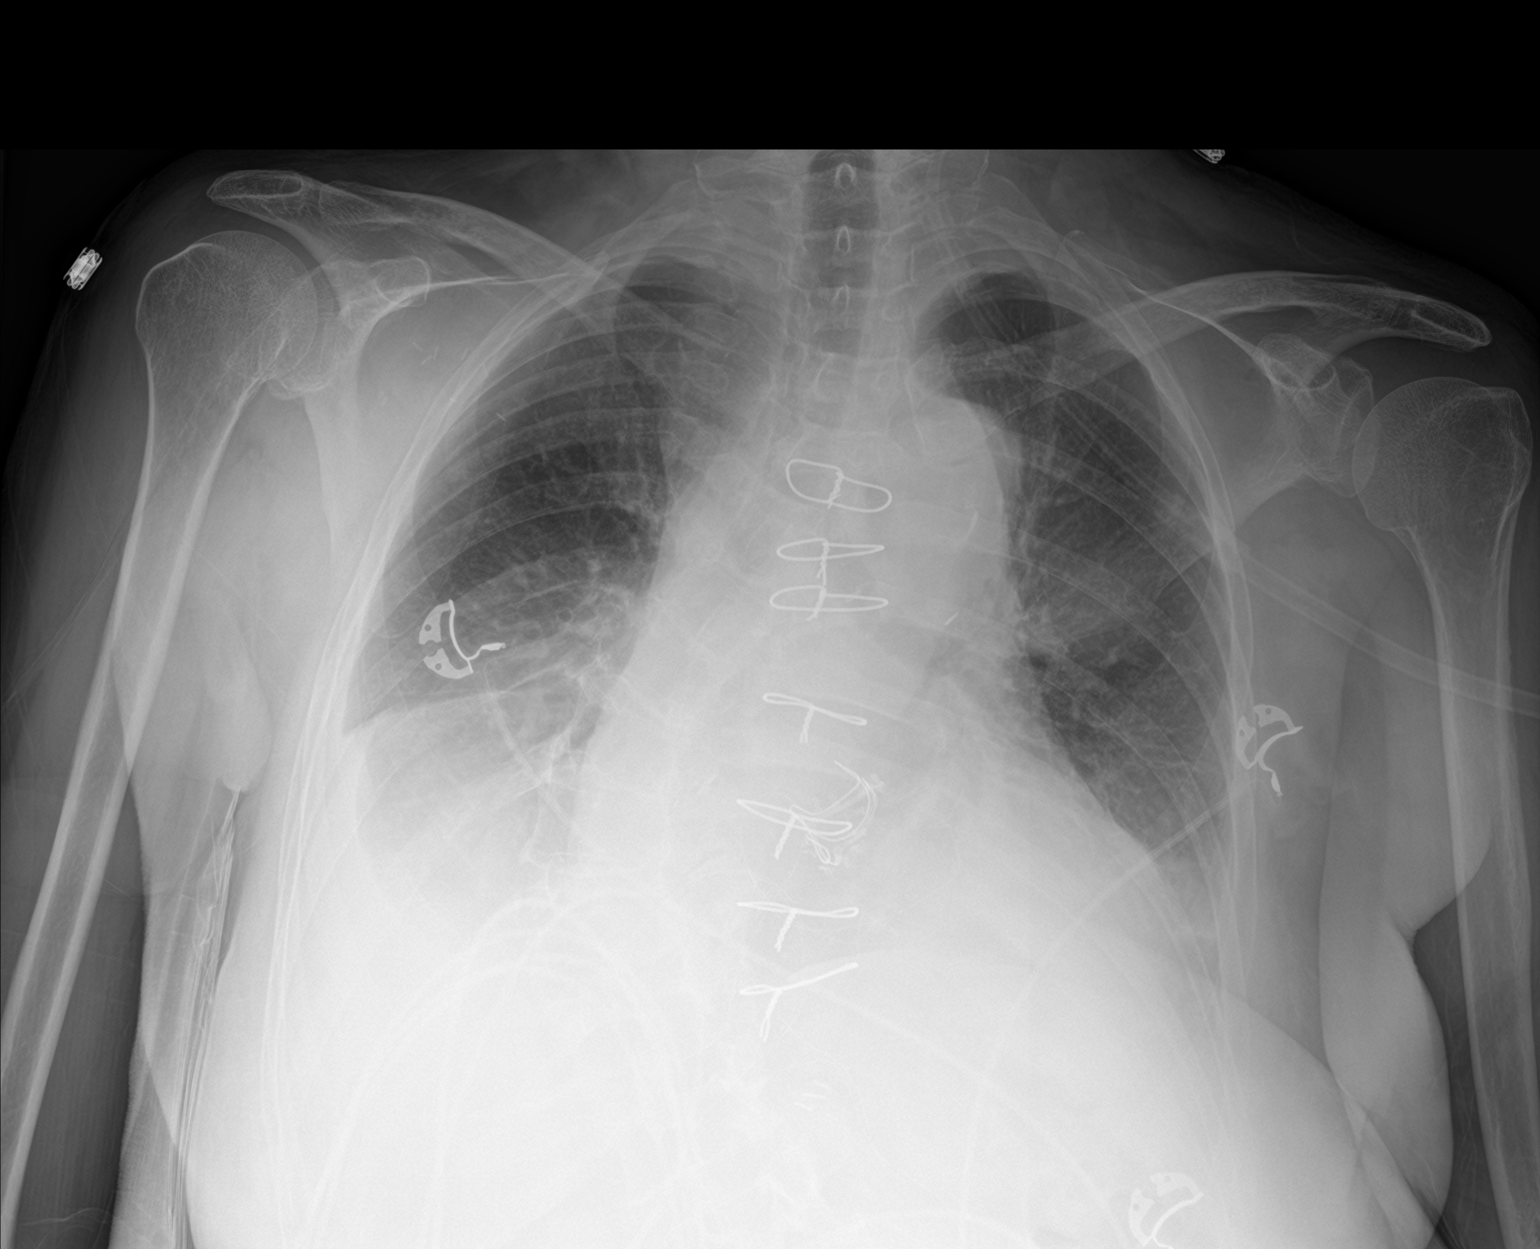

[2 of 2 positions shown; findings below may reference images not displayed]

FINDINGS: Interval removal of a right internal jugular central venous Cordis.
Interval removal of 2 mediastinal drains.

Retained pacing wires.

Prominent ascending thoracic aorta. Atherosclerotic plaque. The
heart size and mediastinal contours are unchanged. Aortic valve
replacement.

Elevation of the right hemidiaphragm with small volume pleural
effusion. Likely trace to small volume left pleural effusion.
Bibasilar atelectasis similar to prior. No pulmonary edema. No
pleural effusion. No pneumothorax.

No acute osseous abnormality.
IMPRESSION: 1. Similar-appearing bilateral, right greater than left, pleural
effusions with bibasilar atelectasis.
2. Other than line and tube removal, no change compared to chest
x-ray 04/23/2021.

## 2022-12-21 ENCOUNTER — Other Ambulatory Visit: Payer: Self-pay | Admitting: Cardiovascular Disease

## 2022-12-21 IMAGING — CR DG CHEST 2V
2 series · 2 of 2 positions shown · non-contrast
Comparison: 04/24/2021

CLINICAL DATA: Sob chest pain post op 1 week ago heart surgery

EXAM:
CHEST - 2 VIEW

[chest lat]
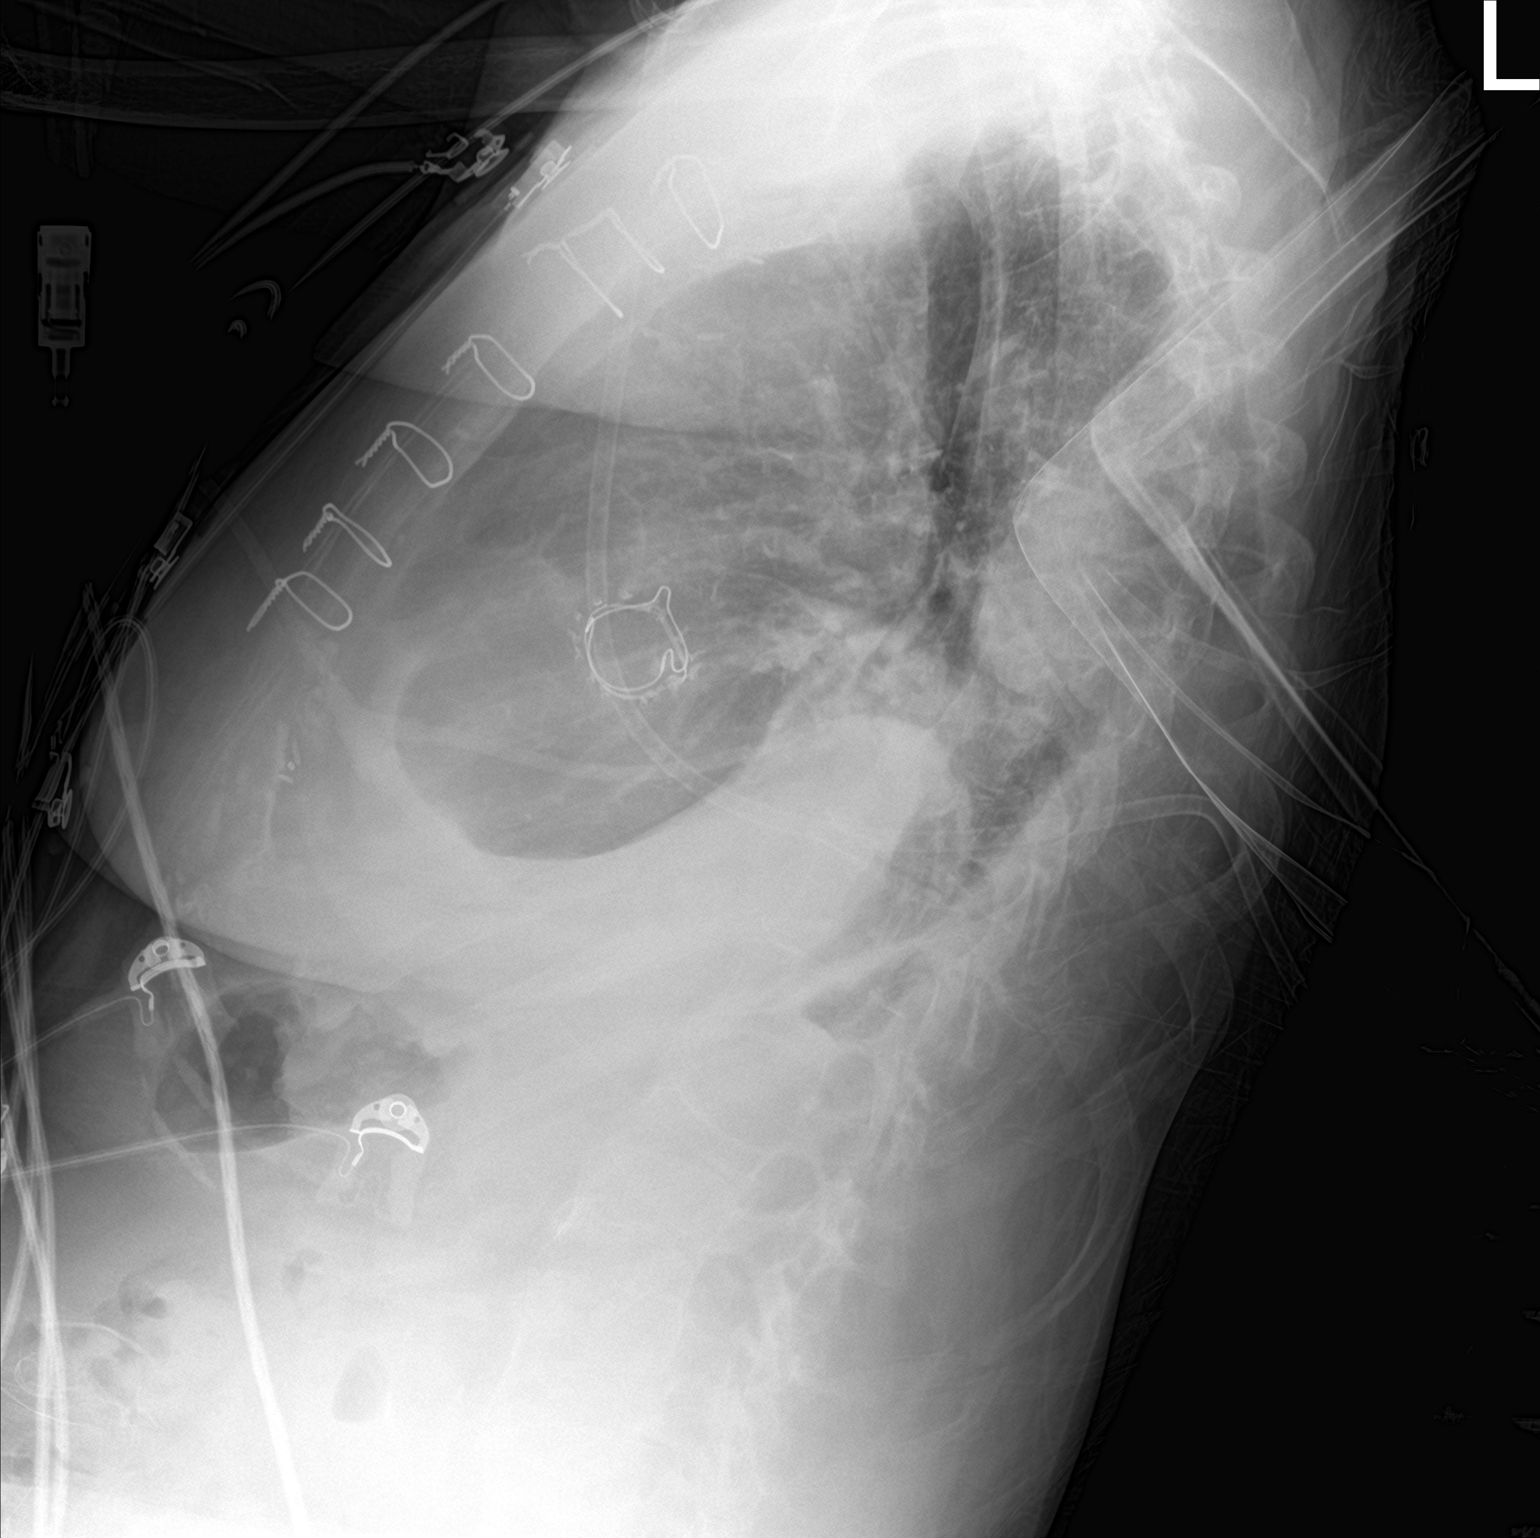

[chest ap]
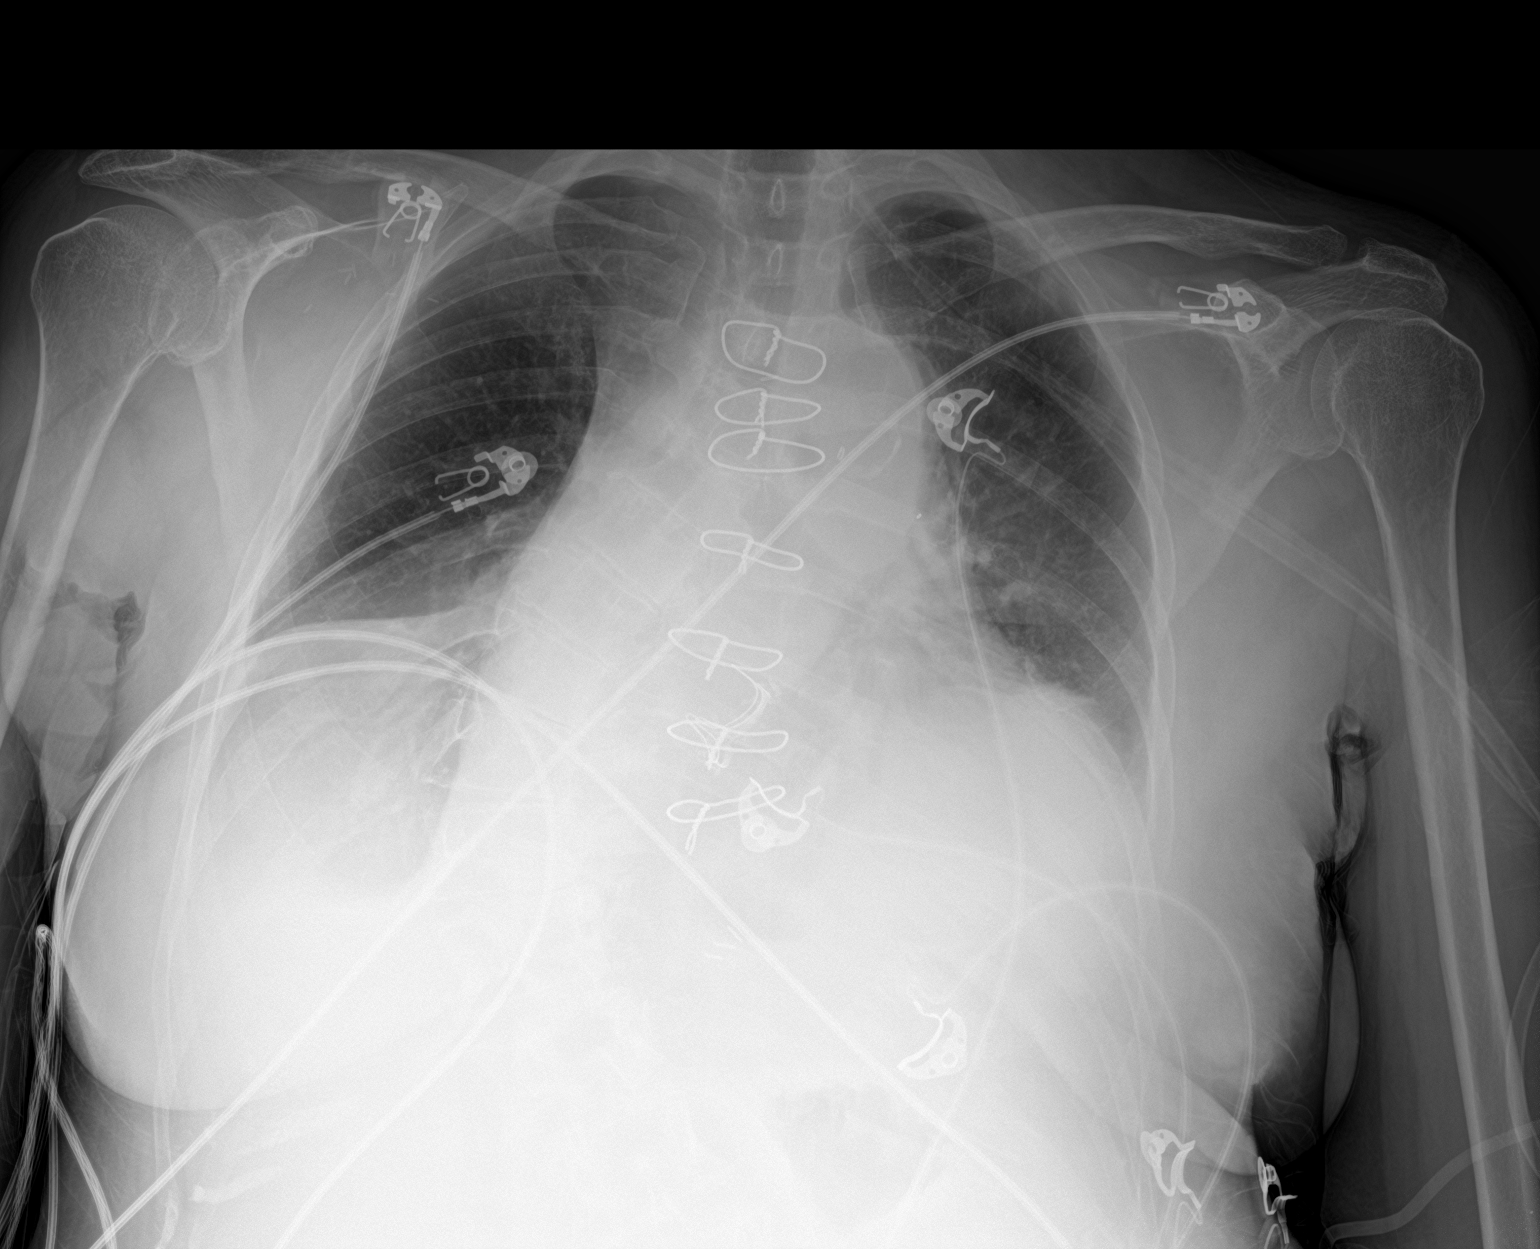

[2 of 2 positions shown; findings below may reference images not displayed]

FINDINGS: Persistent small pleural effusions and bibasilar
atelectasis/consolidation left greater than right.

Heart size upper limits normal. Previous AVR. Aortic Atherosclerosis
([id number]-170.0). Ectatic thoracic aorta as previously documented.

No pneumothorax.

Moderate thoracolumbar dextroscoliosis without evident underlying
vertebral anomaly. Surgical clips in the epigastrium.
IMPRESSION: Persistent small pleural effusions and bibasilar
consolidation/atelectasis.

## 2022-12-21 IMAGING — CT CT HEAD W/O CM
3 series · 15 of 47 positions shown, 18 images · non-contrast
Comparison: 08/19/2017

CLINICAL DATA: Mental status changes.

EXAM:
CT HEAD WITHOUT CONTRAST
TECHNIQUE: Contiguous axial images were obtained from the base of the skull
through the vertex without intravenous contrast.

[Series 3: head 5.0 h30s · axial · 0.48mm/px · z∈[-110,+35]mm · 9 of 35 slices shown, 12 images]
[im 3/35  brain]
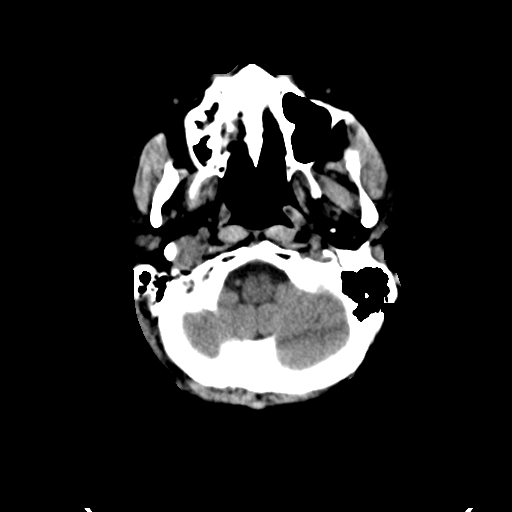
[im 3/35  bone]
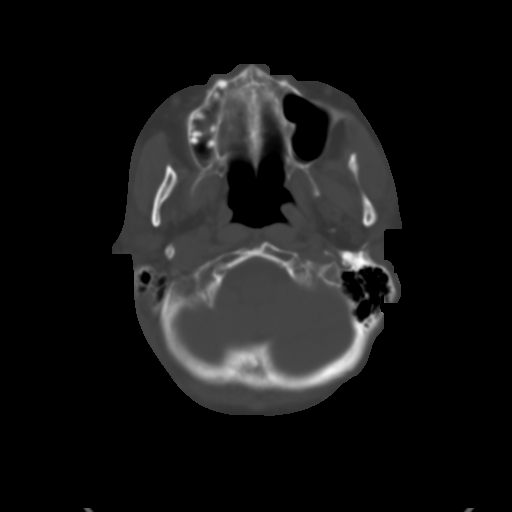
[im 6/35  brain]
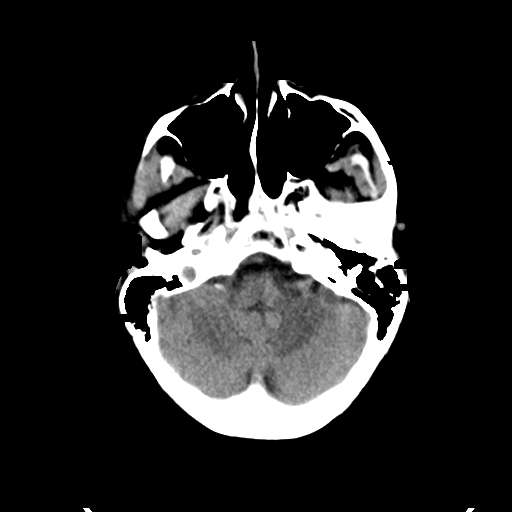
[im 10/35  brain]
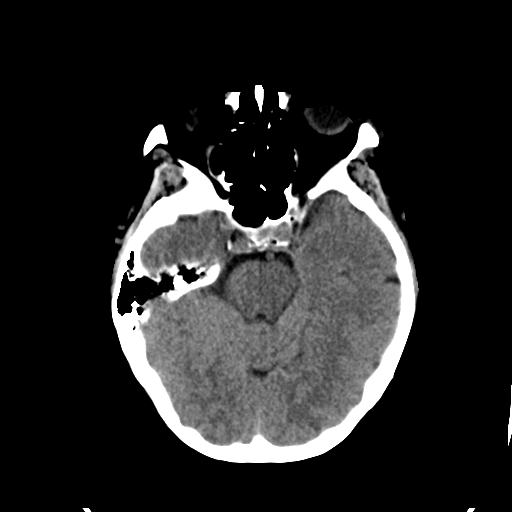
[im 13/35  brain]
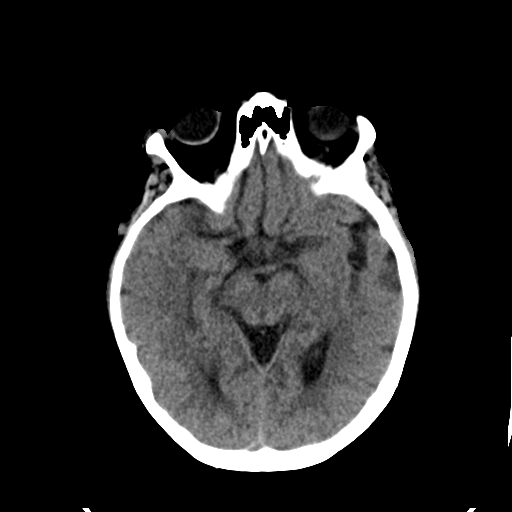
[im 18/35  brain]
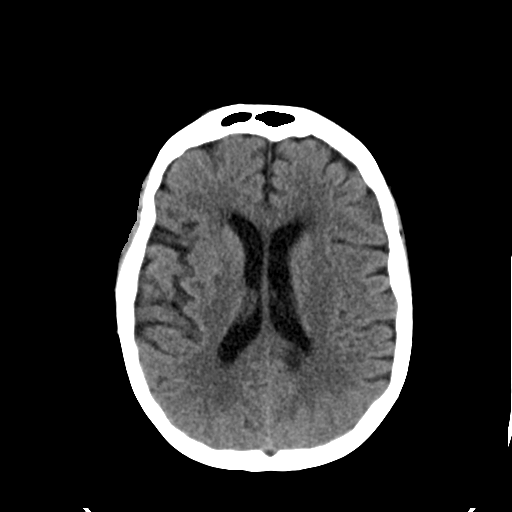
[im 18/35  bone]
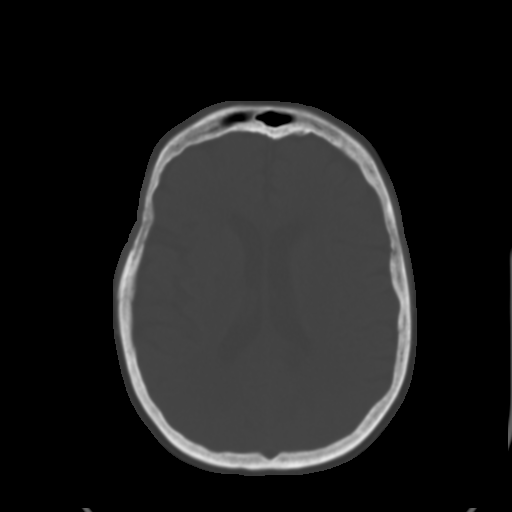
[im 22/35  brain]
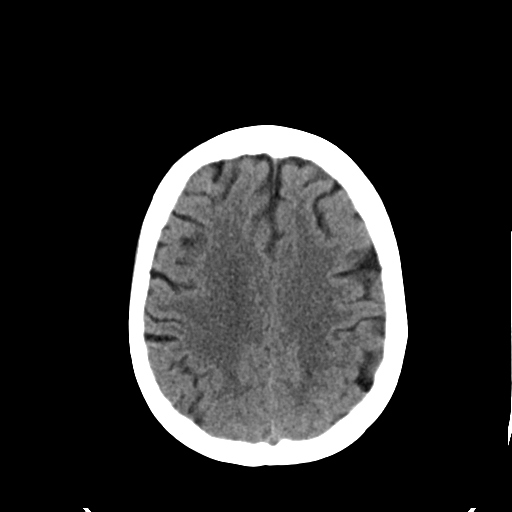
[im 25/35  brain]
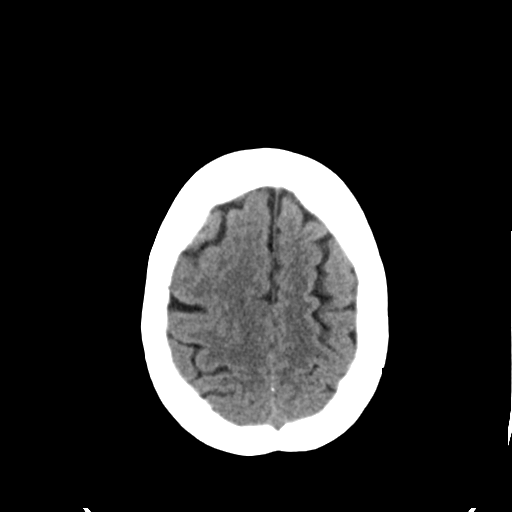
[im 29/35  brain]
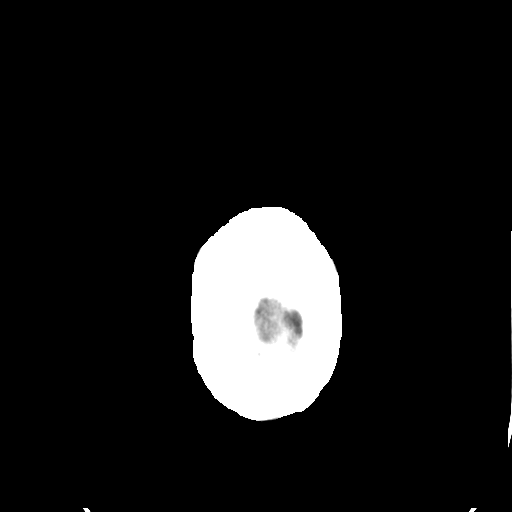
[im 32/35  brain]
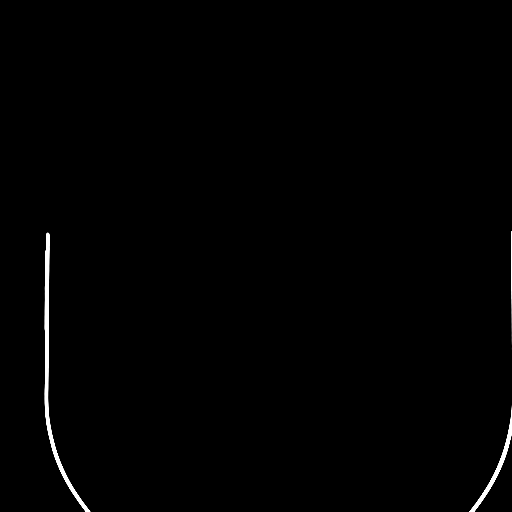
[im 32/35  bone]
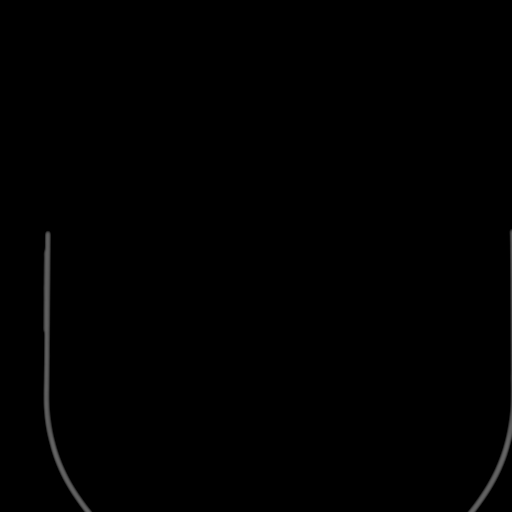

[Series 5: head 3.0 mpr cor · coronal · 0.33mm/px · 3 of 70 slices shown]
[im 24/70  brain]
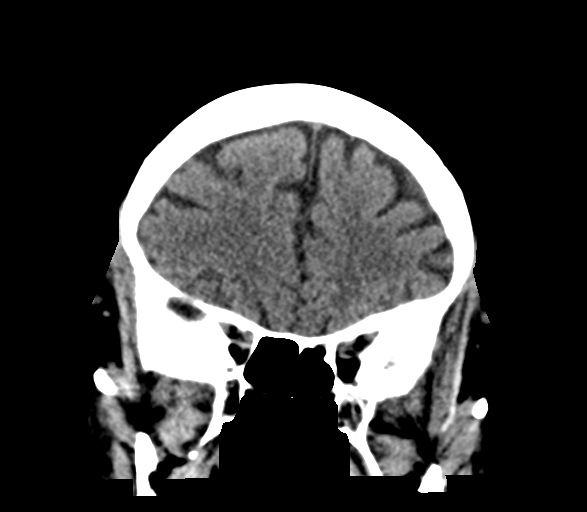
[im 31/70  brain]
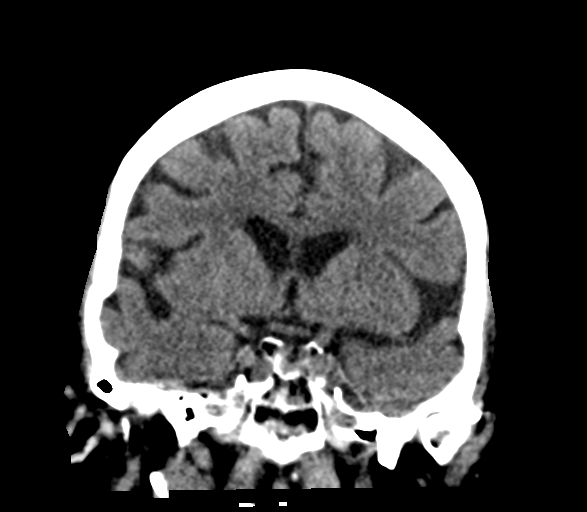
[im 39/70  brain]
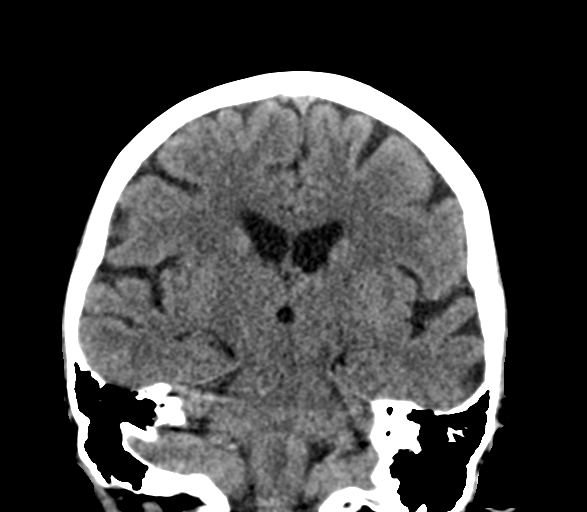

[Series 6: head 3.0 mpr sag · sagittal · 0.34mm/px · 3 of 67 slices shown]
[im 23/67  brain]
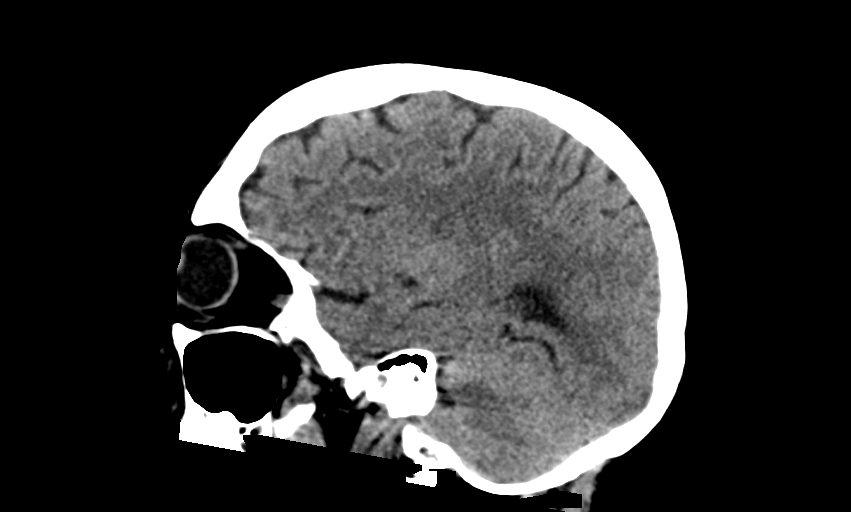
[im 34/67  brain]
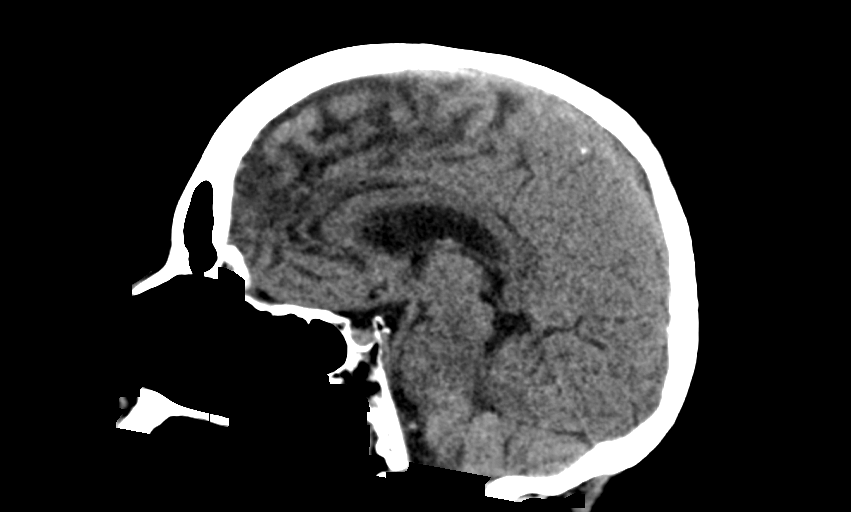
[im 45/67  brain]
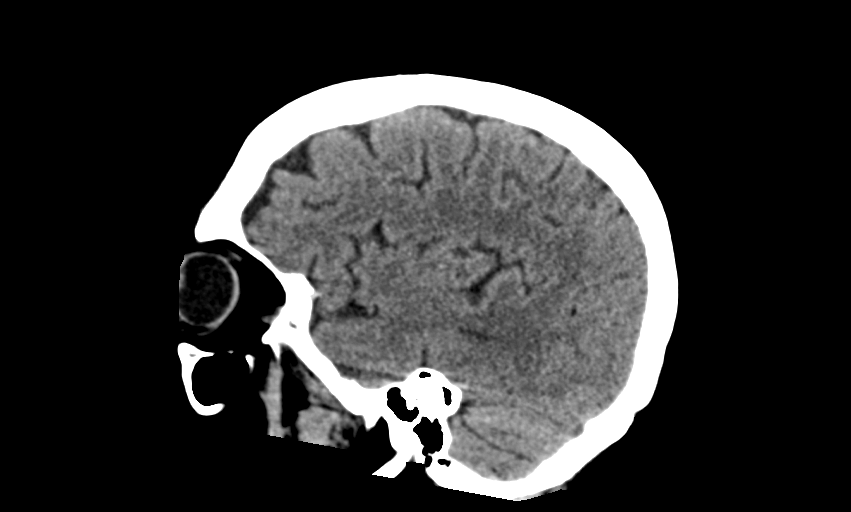

[15 of 47 positions shown; findings below may reference images not displayed]

FINDINGS: Brain: No acute intracranial abnormality. Specifically, no
hemorrhage, hydrocephalus, mass lesion, acute infarction, or
significant intracranial injury.

Vascular: No hyperdense vessel or unexpected calcification.

Skull: No acute calvarial abnormality.

Sinuses/Orbits: No acute findings

Other: None
IMPRESSION: No acute intracranial abnormality.

## 2022-12-21 NOTE — Telephone Encounter (Signed)
Please contact patient for 12 month f/u. Last seen 01-09-22 with Dr. Rockey Situ.  Medication refill pending appt.

## 2022-12-29 ENCOUNTER — Other Ambulatory Visit: Payer: Self-pay | Admitting: Family

## 2022-12-29 ENCOUNTER — Other Ambulatory Visit: Payer: Self-pay | Admitting: Cardiovascular Disease

## 2022-12-29 DIAGNOSIS — F32A Depression, unspecified: Secondary | ICD-10-CM

## 2023-01-03 ENCOUNTER — Other Ambulatory Visit: Payer: Self-pay | Admitting: Cardiovascular Disease

## 2023-01-04 ENCOUNTER — Encounter: Payer: Self-pay | Admitting: Family

## 2023-01-19 ENCOUNTER — Ambulatory Visit: Payer: Medicare Other | Admitting: Family

## 2023-01-23 ENCOUNTER — Other Ambulatory Visit: Payer: Self-pay | Admitting: Family

## 2023-01-23 DIAGNOSIS — F419 Anxiety disorder, unspecified: Secondary | ICD-10-CM

## 2023-01-24 ENCOUNTER — Telehealth: Payer: Self-pay

## 2023-01-24 NOTE — Telephone Encounter (Signed)
LM for pt to cb to sched appt

## 2023-01-24 NOTE — Telephone Encounter (Signed)
Me      01/24/23  9:25 AM Note LM for pt to cb to sched appt    Me      01/24/23  9:25 AM Note LM for pt to cb to sched appt           01/24/23  9:15 AM Arnett, [name], FNP routed this conversation to [facility]  Burnard Hawthorne, FNP      01/24/23  9:15 AM Note Call patient I refilled for 1 month of Klonopin without any refill   I have not seen her since February of last year.  I cannot continue to refill Klonopin if she is not able to be seen every 3 months.  Please remind patient of policy here to be seen every 3 months for controlled substance refill    I believe she is moved to [place]. Please schedule appointment in the next 30 days.

## 2023-01-24 NOTE — Telephone Encounter (Signed)
Call patient I refilled for 1 month of Klonopin without any refill  I have not seen her since February of last year.  I cannot continue to refill Klonopin if she is not able to be seen every 3 months.  Please remind patient of policy here to be seen every 3 months for controlled substance refill   I believe she is moved to [place]. Please schedule appointment in the next 30 days.

## 2023-01-25 ENCOUNTER — Other Ambulatory Visit: Payer: Self-pay | Admitting: Cardiovascular Disease

## 2023-01-26 ENCOUNTER — Other Ambulatory Visit: Payer: Self-pay | Admitting: Family

## 2023-01-26 ENCOUNTER — Other Ambulatory Visit: Payer: Self-pay | Admitting: Cardiovascular Disease

## 2023-01-26 DIAGNOSIS — F419 Anxiety disorder, unspecified: Secondary | ICD-10-CM

## 2023-02-07 ENCOUNTER — Telehealth: Payer: Self-pay

## 2023-02-07 ENCOUNTER — Encounter: Payer: Self-pay | Admitting: Family

## 2023-02-07 ENCOUNTER — Ambulatory Visit (INDEPENDENT_AMBULATORY_CARE_PROVIDER_SITE_OTHER): Payer: Medicare Other | Admitting: Family

## 2023-02-07 VITALS — BP 128/84 | HR 65 | Temp 98.0°F | Ht 63.0 in | Wt 142.4 lb

## 2023-02-07 DIAGNOSIS — Z13220 Encounter for screening for lipoid disorders: Secondary | ICD-10-CM | POA: Diagnosis not present

## 2023-02-07 DIAGNOSIS — F32A Depression, unspecified: Secondary | ICD-10-CM

## 2023-02-07 DIAGNOSIS — I10 Essential (primary) hypertension: Secondary | ICD-10-CM

## 2023-02-07 DIAGNOSIS — Z136 Encounter for screening for cardiovascular disorders: Secondary | ICD-10-CM

## 2023-02-07 DIAGNOSIS — F419 Anxiety disorder, unspecified: Secondary | ICD-10-CM

## 2023-02-07 DIAGNOSIS — Z1211 Encounter for screening for malignant neoplasm of colon: Secondary | ICD-10-CM

## 2023-02-07 DIAGNOSIS — M810 Age-related osteoporosis without current pathological fracture: Secondary | ICD-10-CM

## 2023-02-07 LAB — CBC WITH DIFFERENTIAL/PLATELET
Basophils Absolute: 0.1 10*3/uL (ref 0.0–0.1)
Basophils Relative: 0.9 % (ref 0.0–3.0)
Eosinophils Absolute: 0.2 10*3/uL (ref 0.0–0.7)
Eosinophils Relative: 2.8 % (ref 0.0–5.0)
HCT: 40.1 % (ref 36.0–46.0)
Hemoglobin: 13.6 g/dL (ref 12.0–15.0)
Lymphocytes Relative: 30 % (ref 12.0–46.0)
Lymphs Abs: 2 10*3/uL (ref 0.7–4.0)
MCHC: 33.9 g/dL (ref 30.0–36.0)
MCV: 97.1 fl (ref 78.0–100.0)
Monocytes Absolute: 0.6 10*3/uL (ref 0.1–1.0)
Monocytes Relative: 9.2 % (ref 3.0–12.0)
Neutro Abs: 3.8 10*3/uL (ref 1.4–7.7)
Neutrophils Relative %: 57.1 % (ref 43.0–77.0)
Platelets: 283 10*3/uL (ref 150.0–400.0)
RBC: 4.13 Mil/uL (ref 3.87–5.11)
RDW: 13.7 % (ref 11.5–15.5)
WBC: 6.7 10*3/uL (ref 4.0–10.5)

## 2023-02-07 LAB — TSH: TSH: 3.54 u[IU]/mL (ref 0.35–5.50)

## 2023-02-07 LAB — VITAMIN D 25 HYDROXY (VIT D DEFICIENCY, FRACTURES): VITD: 16.64 ng/mL — ABNORMAL LOW (ref 30.00–100.00)

## 2023-02-07 MED ORDER — LOSARTAN POTASSIUM 25 MG PO TABS: 25.0000 mg | ORAL_TABLET | Freq: Every day | ORAL | 3 refills | Status: DC

## 2023-02-07 MED ORDER — POTASSIUM CHLORIDE CRYS ER 10 MEQ PO TBCR: EXTENDED_RELEASE_TABLET | ORAL | 3 refills | Status: DC

## 2023-02-07 MED ORDER — CLONAZEPAM 0.5 MG PO TABS: 0.5000 mg | ORAL_TABLET | Freq: Three times a day (TID) | ORAL | 2 refills | Status: DC | PRN

## 2023-02-07 NOTE — Assessment & Plan Note (Signed)
Chronic, stable.  Continue Lexapro 10 mg, Klonopin 0.5 mg TID prn.

## 2023-02-07 NOTE — Assessment & Plan Note (Addendum)
Chronic, stable. Continue Lopressor 50 mg BID, losartan 25 mg QD, Lasix 40 mg daily, potassium chloride 10 meq qd

## 2023-02-07 NOTE — Assessment & Plan Note (Signed)
Discussed osteoporosis today.  We focused on conservative therapy including increasing weightbearing exercise such as walking versus biphosphonate at this time.  I encouraged her to look at Silver sneakers program.  Discussed the importance of vitamin D, calcium supplementation and provided information on after visit summary.

## 2023-02-07 NOTE — Telephone Encounter (Signed)
Pt called back to reinterarte that she had used hot compress on eye 2 weeks ago for the stye and it just came back and would like to know if you could call something into pharmacy for her

## 2023-02-07 NOTE — Patient Instructions (Addendum)
At follow-up, let's plan to discuss treatment for osteoporosis in regards to medication such as Fosamax  For post menopausal women, guidelines recommend a diet with 1200 mg of Calcium per day. If you are eating calcium rich foods, you do not need a calcium supplement. The body better absorbs the calcium that you eat over supplementation. If you do supplement, I recommend not supplementing the full 1200 mg/ day as this can lead to increased risk of cardiovascular disease. I recommend Calcium Citrate over the counter, and you may take a total of 600 to 800 mg per day in divided doses with meals for best absorption.   For bone health, you need adequate vitamin D, and I recommend you supplement as it is harder to do so with diet alone. I recommend cholecalciferol 800 units daily.  Also, please ensure you are following a diet high in calcium -- research shows better outcomes with dietary sources including kale, yogurt, broccolii, cheese, okra, almonds- to name a few.     Also remember that exercise is a great medicine for maintain and preserve bone health. Advise moderate exercise for 30 minutes , 3 times per week.

## 2023-02-07 NOTE — Progress Notes (Signed)
Assessment & Plan:  Screening for colon cancer  Osteoporosis, unspecified osteoporosis type, unspecified pathological fracture presence Assessment & Plan: Discussed osteoporosis today.  We focused on conservative therapy including increasing weightbearing exercise such as walking versus biphosphonate at this time.  I encouraged her to look at Silver sneakers program.  Discussed the importance of vitamin D, calcium supplementation and provided information on after visit summary.  Orders: -     Comprehensive metabolic panel -     VITAMIN D 25 Hydroxy (Vit-D Deficiency, Fractures)  Primary hypertension Assessment & Plan: Chronic, stable. Continue Lopressor 50 mg BID, losartan 25 mg QD, Lasix 40 mg daily, potassium chloride 10 meq qd  Orders: -     CBC with Differential/Platelet -     Comprehensive metabolic panel -     TSH  Encounter for lipid screening for cardiovascular disease -     Lipid panel  Anxiety and depression Assessment & Plan: Chronic, stable.  Continue Lexapro 10 mg, Klonopin 0.5 mg TID prn.  Orders: -     clonazePAM; Take 1 tablet (0.5 mg total) by mouth 3 (three) times daily as needed for anxiety.  Dispense: 90 tablet; Refill: 2  Other orders -     Losartan Potassium; Take 1 tablet (25 mg total) by mouth daily.  Dispense: 90 tablet; Refill: 3 -     Potassium Chloride Crys ER; TAKE 1 TABLET BY MOUTH EVERY DAY.  Dispense: 90 tablet; Refill: 3     Return precautions given.   Risks, benefits, and alternatives of the medications and treatment plan prescribed today were discussed, and patient expressed understanding.   Education regarding symptom management and diagnosis given to patient on AVS either electronically or printed.  Return in about 3 months (around 05/08/2023) for Medicare Wellness upcoming or due, schedule.  Mable Paris, FNP  Subjective:    Patient ID: Deborah Koch, female    DOB: 07-Apr-1956, 67 y.o.   MRN: [id number]  CC: Deborah Koch is a 67 y.o. female who presents today for follow up.   HPI: Feels well today.  No new complaints.  She is walking 20 minutes 2 or 3 times per week.  Denies shortness of breath, chest pain, fatigue   she is currently not on vitamin D, calcium supplementation.  She is taking Klonopin 0.5 mg is helpful for her anxiety.  She is compliant with Lexapro 10 mg daily. She has follow-up scheduled with Dr. Rockey Situ next month  She declines scheduling colonoscopy at this time.  She will reach out to Dr. Dorothey Baseman office when she is ready Allergies: Hydrocodone Current Outpatient Medications on File Prior to Visit  Medication Sig Dispense Refill   escitalopram (LEXAPRO) 10 MG tablet TAKE 1 TABLET BY MOUTH EVERY DAY 90 tablet 1   furosemide (LASIX) 40 MG tablet TAKE 1 TABLET BY MOUTH EVERY DAY 30 tablet 1   metoprolol tartrate (LOPRESSOR) 50 MG tablet TAKE 1 TABLET BY MOUTH 2 (TWO) TIMES DAILY 60 tablet 1   Current Facility-Administered Medications on File Prior to Visit  Medication Dose Route Frequency Provider Last Rate Last Admin   sodium chloride flush (NS) 0.9 % injection 3 mL  3 mL Intravenous Q12H Visser, Jacquelyn D, PA-C        Review of Systems  Constitutional:  Negative for chills and fever.  Respiratory:  Negative for cough.   Cardiovascular:  Negative for chest pain and palpitations.  Gastrointestinal:  Negative for nausea and vomiting.  Objective:    BP 128/84   Pulse 65   Temp 98 F (36.7 C) (Oral)   Ht 5' 3"$  (1.6 m)   Wt 142 lb 6.4 oz (64.6 kg)   SpO2 97%   BMI 25.23 kg/m  BP Readings from Last 3 Encounters:  02/07/23 128/84  01/23/22 120/68  01/09/22 128/80   Wt Readings from Last 3 Encounters:  02/07/23 142 lb 6.4 oz (64.6 kg)  01/23/22 133 lb 12.8 oz (60.7 kg)  01/09/22 136 lb 2 oz (61.7 kg)    Physical Exam Vitals reviewed.  Constitutional:      Appearance: She is well-developed.  Eyes:     Conjunctiva/sclera: Conjunctivae normal.   Cardiovascular:     Rate and Rhythm: Normal rate and regular rhythm.     Pulses: Normal pulses.     Heart sounds: Normal heart sounds.  Pulmonary:     Effort: Pulmonary effort is normal.     Breath sounds: Normal breath sounds. No wheezing, rhonchi or rales.  Skin:    General: Skin is warm and dry.  Neurological:     Mental Status: She is alert.  Psychiatric:        Speech: Speech normal.        Behavior: Behavior normal.        Thought Content: Thought content normal.

## 2023-02-08 LAB — COMPREHENSIVE METABOLIC PANEL
ALT: 11 U/L (ref 0–35)
AST: 15 U/L (ref 0–37)
Albumin: 4.4 g/dL (ref 3.5–5.2)
Alkaline Phosphatase: 101 U/L (ref 39–117)
BUN: 19 mg/dL (ref 6–23)
CO2: 26 mEq/L (ref 19–32)
Calcium: 10 mg/dL (ref 8.4–10.5)
Chloride: 104 mEq/L (ref 96–112)
Creatinine, Ser: 0.89 mg/dL (ref 0.40–1.20)
GFR: 66.96 mL/min (ref >60.00)
Glucose, Bld: 105 mg/dL — ABNORMAL HIGH (ref 70–99)
Potassium: 4.4 mEq/L (ref 3.5–5.1)
Sodium: 141 mEq/L (ref 135–145)
Total Bilirubin: 0.5 mg/dL (ref 0.2–1.2)
Total Protein: 7.1 g/dL (ref 6.0–8.3)

## 2023-02-08 LAB — LIPID PANEL
Cholesterol: 194 mg/dL (ref 0–200)
HDL: 73.9 mg/dL (ref >39.00)
LDL Cholesterol: 95 mg/dL (ref 0–99)
NonHDL: 119.81
Total CHOL/HDL Ratio: 3
Triglycerides: 122 mg/dL (ref 0.0–149.0)
VLDL: 24.4 mg/dL (ref 0.0–40.0)

## 2023-02-11 ENCOUNTER — Encounter: Payer: Self-pay | Admitting: Family

## 2023-02-11 NOTE — Telephone Encounter (Signed)
I have sent her a Pharmacist, community

## 2023-02-14 ENCOUNTER — Ambulatory Visit: Payer: Medicare Other | Admitting: Family

## 2023-02-19 ENCOUNTER — Ambulatory Visit: Payer: Medicare Other | Admitting: Family

## 2023-02-21 ENCOUNTER — Other Ambulatory Visit: Payer: Self-pay | Admitting: Cardiovascular Disease

## 2023-02-23 ENCOUNTER — Other Ambulatory Visit: Payer: Self-pay | Admitting: Cardiovascular Disease

## 2023-03-13 ENCOUNTER — Encounter: Payer: Self-pay | Admitting: Cardiovascular Disease

## 2023-03-13 ENCOUNTER — Ambulatory Visit: Payer: Medicare Other | Attending: Cardiovascular Disease | Admitting: Cardiovascular Disease

## 2023-03-13 VITALS — BP 134/62 | HR 50 | Ht 63.0 in | Wt 143.4 lb

## 2023-03-13 DIAGNOSIS — Z9889 Other specified postprocedural states: Secondary | ICD-10-CM

## 2023-03-13 DIAGNOSIS — Q231 Congenital insufficiency of aortic valve: Secondary | ICD-10-CM | POA: Diagnosis not present

## 2023-03-13 DIAGNOSIS — I480 Paroxysmal atrial fibrillation: Secondary | ICD-10-CM

## 2023-03-13 DIAGNOSIS — I700 Atherosclerosis of aorta: Secondary | ICD-10-CM | POA: Diagnosis not present

## 2023-03-13 DIAGNOSIS — Q2381 Bicuspid aortic valve: Secondary | ICD-10-CM

## 2023-03-13 DIAGNOSIS — Z953 Presence of xenogenic heart valve: Secondary | ICD-10-CM | POA: Diagnosis not present

## 2023-03-13 DIAGNOSIS — I10 Essential (primary) hypertension: Secondary | ICD-10-CM | POA: Diagnosis not present

## 2023-03-13 DIAGNOSIS — Z8679 Personal history of other diseases of the circulatory system: Secondary | ICD-10-CM

## 2023-03-13 DIAGNOSIS — I350 Nonrheumatic aortic (valve) stenosis: Secondary | ICD-10-CM

## 2023-03-13 DIAGNOSIS — I77810 Thoracic aortic ectasia: Secondary | ICD-10-CM | POA: Diagnosis not present

## 2023-03-13 DIAGNOSIS — I3139 Other pericardial effusion (noninflammatory): Secondary | ICD-10-CM

## 2023-03-13 MED ORDER — METOPROLOL TARTRATE 50 MG PO TABS: 25.0000 mg | ORAL_TABLET | Freq: Two times a day (BID) | ORAL | 0 refills | Status: DC

## 2023-03-13 NOTE — Progress Notes (Signed)
Date:  03/13/2023   ID:  [name], DOB July 25, 1956, MRN [id number]  Patient Location:  [place] [id number]   Provider location:   [facility], [place] office  PCP:  Burnard Hawthorne, FNP  Cardiologist:  Arvid Right [facility]  Chief Complaint  Patient presents with   Follow-up    1 yr f/u.  Patient denies new problems/concerns today.   Medications reviewed verbally.    History of Present Illness:    Deborah Koch is a 67 y.o. female  past medical history of bicuspid aortic valve, maortic valve stenosis ,  F/u CTA of the chest showed a 4.7 cm fusiform ascending aortic aneurysm.  She was subsequently referred to thoracic surgery and on Apr 21 2021, she underwent replacement of the ascending aorta and aortic arch with aorto-innominate and aorto-left carotid bypass with supra coronary anastomosis as well as aortic valve replacement. 23 mm Edwards bovine valve present in the aortic position.  PTSD from surgery hypertension,  GERD,  out on disability for scoliosis among other issues prior smoking history Anxiety who presents for routine follow-up of her aortic valve stenosis, dilating ascending aorta s/p aortic surgery and prosthetic valve  Last seen by myself in clinic January 2023  Echo July 2022 Walks around the block, 1/2 miles, ADLs Reports that her weight is up 10 pounds over the past year  If she over does it , hedging in garden, groceries, sometimes gets soreness in the chest which she feels is muscle  Less nightmares, PTSD this year compared to last year Sleeping better  No significant shortness of breath, no leg swelling, no significant orthopnea Palpitations concerning for arrhythmia Overall in good spirits, feels well  History of discomfort from scoliosis  06/2021 echo, stable  EKG personally reviewed by myself on todays visit Normal sinus rhythm rate 50 bpm left axis deviation  Other past medical  history reviewed Prior CTA of the chest showed a 4.7 cm fusiform ascending aortic aneurysm.  She was subsequently referred to thoracic surgery and on May 5, she underwent replacement of the ascending aorta and aortic arch with aorto-innominate and aorto-left carotid bypass with supra coronary anastomosis as well as aortic valve replacement.  hospitalizations  status post replacement of the ascending aorta and aortic arch with aorto-innominate and aorto-left carotid bypass with supra coronary anastomosis   aortic valve replacement  rehospitalization with pleural effusions, pericardial effusion, and paroxysmal atrial fibrillation. moderate pericardial effusion and right greater than left pleural effusion.  Echocardiogram July 2022 1. Left ventricular ejection fraction, by estimation, is 55 to 60%. The  left ventricle has normal function.   2. There is no evidence of pericardial effusion.   3. The mitral valve is normal in structure. Mild mitral valve  regurgitation.   4. Tricuspid valve regurgitation is moderate.   5. The aortic valve has been repaired/replaced. There is a 23 mm Edwards  bovine valve present in the aortic position. Procedure Date: 04/2021.     Past Medical History:  Diagnosis Date   Anxiety    Ascending Aortic Aneurysm    a. 04/2021 s/p replacement of the ascending aorta and aortic arch with aorta-innominate and aorta-left carotid bypass with supra coronary anastomosis as well as AVR with 23 mm Edwards pericardial tissue valve.   C. difficile colitis    a. 07/2014.   Congenital bicuspid aortic valve    Depression    Diverticulitis    hospitalized; subsequent c  diff infection.    GERD (gastroesophageal reflux disease)    H/O mastitis 2015   Heart murmur    History of cardiac catheterization    a. 03/2021 Nl cors.   History of tobacco abuse    a. 20 yrs, 1/4 ppd - quit.   HTN (hypertension)    Hypokalemia    Multilevel degenerative disc disease    PAF (paroxysmal  atrial fibrillation) ([place])    a. 04/2021-->amio. No OAC 2/2 concern for hemopericardium (post-op Ao arch/AVR).   Pleural effusion, bilateral    a. 04/2021 s/p thoracic surgery.   Pneumonia    at age 56   Scoliosis    Severe aortic stenosis w/ congenital bicuspid AoV    a. 04/2021 s/p 93mm edwards pericardial tissue valve; b. 05/12/2021 Echo: EF 55-60%, no rwma, Gr2 DD, nl RV fxn, mild BAE, moderate pericardial effusion, mild MR, mild to mod TR, Nl fxn'ing AoV prosthesis.   Past Surgical History:  Procedure Laterality Date   AORTA -INNOMIATE BYPASS N/A 04/21/2021   Procedure: AORTA -[place] BYPASS;  Surgeon: Gaye Pollack, MD;  Location: [facility] OR;  Service: Vascular;  Laterality: N/A;   AORTIC VALVE REPLACEMENT N/A 04/21/2021   Procedure: AORTIC VALVE REPLACEMENT (AVR) ON PUMP WITH 23MM INSPIRIS AORTIC VALVE;  Surgeon: Gaye Pollack, MD;  Location: [place];  Service: Open Heart Surgery;  Laterality: N/A;   BARTHOLIN GLAND CYST EXCISION     BREAST BIOPSY Left    neg   CAROTID-SUBCLAVIAN BYPASS GRAFT Left 04/21/2021   Procedure: AORTA LEFT COMMON CAROTID BYPASS;  Surgeon: Gaye Pollack, MD;  Location: [facility];  Service: Vascular;  Laterality: Left;   COLONOSCOPY  2013   Dr. Allen Norris   COLONOSCOPY WITH PROPOFOL N/A 10/23/2017   Procedure: COLONOSCOPY WITH PROPOFOL;  Surgeon: Lucilla Lame, MD;  Location: [facility] END[facility]COPY;  Service: Endoscopy;  Laterality: N/A;   OOPHORECTOMY  2016   OVARY SURGERY  2016   cyst on right ovary   REPLACEMENT ASCENDING AORTA N/A 04/21/2021   Procedure: REPLACEMENT ASCENDING & ARCH AORTA WITH HEMASHIELD PLATINUM GRAFT 28 [id number];  Surgeon: Gaye Pollack, MD;  Location: [place];  Service: Open Heart Surgery;  Laterality: N/A;  CIRC ARREST  RIGHT AXILLARY ARTERY CANNULATION   RIGHT/LEFT HEART CATH AND CORONARY ANGIOGRAPHY N/A 03/24/2021   Procedure: RIGHT/LEFT HEART CATH AND CORONARY ANGIOGRAPHY;  Surgeon: Minna Merritts, MD;  Location: [facility] CV LAB;  Service:  Cardiovascular;  Laterality: N/A;   SALPINGECTOMY Bilateral    TEE WITHOUT CARDIOVERSION N/A 04/21/2021   Procedure: TRANSESOPHAGEAL ECHOCARDIOGRAM (TEE);  Surgeon: Gaye Pollack, MD;  Location: [facility];  Service: Open Heart Surgery;  Laterality: N/A;      Allergies:   Hydrocodone   Social History   Tobacco Use   Smoking status: Former    Types: Cigarettes    Quit date: 04/20/2021    Years since quitting: 1.8   Smokeless tobacco: Never  Vaping Use   Vaping Use: Never used  Substance Use Topics   Alcohol use: Yes    Alcohol/week: 3.0 standard drinks of alcohol    Types: 3 Glasses of wine per week    Comment:  2-3 glasses of wine every other evening   Drug use: No     Current Outpatient Medications on File Prior to Visit  Medication Sig Dispense Refill   clonazePAM (KLONOPIN) 0.5 MG tablet Take 1 tablet (0.5 mg total) by mouth 3 (three) times daily as needed for anxiety. [place]  tablet 2   escitalopram (LEXAPRO) 10 MG tablet TAKE 1 TABLET BY MOUTH EVERY DAY 90 tablet 1   furosemide (LASIX) 40 MG tablet TAKE 1 TABLET BY MOUTH EVERY DAY 30 tablet 1   losartan (COZAAR) 25 MG tablet Take 1 tablet (25 mg total) by mouth daily. 90 tablet 3   metoprolol tartrate (LOPRESSOR) 50 MG tablet Take 1 tablet (50 mg total) by mouth 2 (two) times daily. Pt needs to keep upcoming appt in Mar for 90 day supply 60 tablet 0   potassium chloride (KLOR-CON M10) 10 MEQ tablet TAKE 1 TABLET BY MOUTH EVERY DAY. 90 tablet 3   Current Facility-Administered Medications on File Prior to Visit  Medication Dose Route Frequency Provider Last Rate Last Admin   sodium chloride flush (NS) 0.9 % injection 3 mL  3 mL Intravenous Q12H Visser, Jacquelyn D, PA-C         Family Hx: The patient's family history includes Alcohol abuse in her brother; Anxiety disorder in her mother and sister; Breast cancer in her maternal aunt; Colon cancer in her mother; Depression in her mother and sister; Heart failure in her father;  Hypertension in her father and mother; Schizophrenia in her brother. There is no history of Heart attack or Stroke.  ROS:   Please see the history of present illness.    Review of Systems  Constitutional: Negative.   HENT: Negative.    Respiratory: Negative.    Cardiovascular: Negative.   Gastrointestinal: Negative.   Musculoskeletal: Negative.   Neurological: Negative.   Psychiatric/Behavioral: Negative.    All other systems reviewed and are negative.    Labs/Other Tests and Data Reviewed:    Recent Labs: 02/07/2023: ALT 11; BUN 19; Creatinine, Ser 0.89; Hemoglobin 13.6; Platelets 283.0; Potassium 4.4; Sodium 141; TSH 3.54   Recent Lipid Panel Lab Results  Component Value Date/Time   CHOL 194 02/07/2023 10:13 AM   TRIG 122.0 02/07/2023 10:13 AM   HDL 73.90 02/07/2023 10:13 AM   CHOLHDL 3 02/07/2023 10:13 AM   LDLCALC 95 02/07/2023 10:13 AM    Wt Readings from Last 3 Encounters:  03/13/23 143 lb 6.4 oz (65 kg)  02/07/23 142 lb 6.4 oz (64.6 kg)  01/23/22 133 lb 12.8 oz (60.7 kg)     Exam:    BP 134/62 (BP Location: Left Arm, Patient Position: Sitting, Cuff Size: Normal)   Pulse (!) 50   Ht 5\' 3"  (1.6 m)   Wt 143 lb 6.4 oz (65 kg)   HC 2" (5.1 cm)   SpO2 97%   BMI 25.40 kg/m   Constitutional:  oriented to person, place, and time. No distress.  HENT:  Head: Grossly normal Eyes:  no discharge. No scleral icterus.  Neck: No JVD, no carotid bruits  Cardiovascular: Bradycardic, normal  rhythm, no murmurs appreciated Pulmonary/Chest: Clear to auscultation bilaterally, no wheezes or rails Abdominal: Soft.  no distension.  no tenderness.  Musculoskeletal: Normal range of motion Neurological:  normal muscle tone. Coordination normal. No atrophy Skin: Skin warm and dry Psychiatric: normal affect, pleasant  ASSESSMENT & PLAN:    Problem List Items Addressed This Visit       Cardiology Problems   Congenital bicuspid aortic valve   Ascending aorta dilation (HCC)    Atherosclerosis of aorta (HCC)     Other   S/P ascending aortic aneurysm repair   Other Visit Diagnoses     PAF (paroxysmal atrial fibrillation) ([place])    -  Primary  Severe aortic stenosis       Essential hypertension       Pericardial effusion       S/P aortic valve replacement with bioprosthetic valve         Aortic stenosis S/p surgery,long recovery Ascending aorta graft placement with bioprosthetic aortic valve Echocardiogram extending into the carotids ordered  Dilated ascending aorta Underwent surgery 2022, has made full recovery Echocardiogram ordered To evaluate  Hypertension We will decrease metoprolol to tartrate down to 25 twice daily given bradycardia  Depression Doing better, PTSD better, less nightmares Has made good recovery from surgery May [id number]  Chronic diastolic CHF Euvolemic, continue Lasix with potassium Stable renal function and potassium level   Total encounter time more than 30 minutes  Greater than 50% was spent in counseling and coordination of care with the patient    Signed, Ida Rogue, [facility] [address], [place], [place] [address]

## 2023-03-13 NOTE — Patient Instructions (Addendum)
Medication Instructions:  OTC Vit D3, 2-3 K units a day  Decrease metoprolol tartrate (LOPRESSOR) 50 MG to 25MG  twice a day  If you need a refill on your cardiac medications before your next appointment, please call your pharmacy.   Lab work: No new labs needed  Testing/Procedures: Echo and carotid u/s for open heart surgery, bypass to carotid, ascending aorta surgery  Your physician has requested that you have an echocardiogram. Echocardiography is a painless test that uses sound waves to create images of your heart. It provides your doctor with information about the size and shape of your heart and how well your heart's chambers and valves are working. This procedure takes approximately one hour. There are no restrictions for this procedure. Please do NOT wear cologne, perfume, aftershave, or lotions (deodorant is allowed). Please arrive 15 minutes prior to your appointment time.   Your physician has requested that you have a carotid duplex. This test is an ultrasound of the carotid arteries in your neck. It looks at blood flow through these arteries that supply the brain with blood. Allow one hour for this exam. There are no restrictions or special instructions.   Follow-Up: At [facility], you and your health needs are our priority.  As part of our continuing mission to provide you with exceptional heart care, we have created designated Provider Care Teams.  These Care Teams include your primary Cardiologist (physician) and Advanced Practice Providers (APPs -  Physician Assistants and Nurse Practitioners) who all work together to provide you with the care you need, when you need it.  You will need a follow up appointment in 12 months  Providers on your designated Care Team:   Murray Hodgkins, NP Christell Faith, PA-C Cadence Kathlen Mody, [place]  COVID-19 Vaccine Information can be found at: [email or web address].uk For questions related to  vaccine distribution or appointments, please email [email or web address] or call [phone].

## 2023-03-21 ENCOUNTER — Encounter: Payer: Self-pay | Admitting: Cardiovascular Disease

## 2023-03-26 ENCOUNTER — Other Ambulatory Visit: Payer: Self-pay | Admitting: Cardiovascular Disease

## 2023-04-03 ENCOUNTER — Other Ambulatory Visit: Payer: Self-pay | Admitting: Cardiovascular Disease

## 2023-04-03 DIAGNOSIS — I480 Paroxysmal atrial fibrillation: Secondary | ICD-10-CM

## 2023-04-03 DIAGNOSIS — I77810 Thoracic aortic ectasia: Secondary | ICD-10-CM

## 2023-04-03 DIAGNOSIS — Z953 Presence of xenogenic heart valve: Secondary | ICD-10-CM

## 2023-04-03 DIAGNOSIS — I6529 Occlusion and stenosis of unspecified carotid artery: Secondary | ICD-10-CM

## 2023-04-03 DIAGNOSIS — I350 Nonrheumatic aortic (valve) stenosis: Secondary | ICD-10-CM

## 2023-04-03 DIAGNOSIS — I10 Essential (primary) hypertension: Secondary | ICD-10-CM

## 2023-04-03 DIAGNOSIS — I700 Atherosclerosis of aorta: Secondary | ICD-10-CM

## 2023-04-03 DIAGNOSIS — Q231 Congenital insufficiency of aortic valve: Secondary | ICD-10-CM

## 2023-04-03 DIAGNOSIS — Z8679 Personal history of other diseases of the circulatory system: Secondary | ICD-10-CM

## 2023-04-03 DIAGNOSIS — I3139 Other pericardial effusion (noninflammatory): Secondary | ICD-10-CM

## 2023-04-09 ENCOUNTER — Other Ambulatory Visit: Payer: Self-pay | Admitting: Cardiovascular Disease

## 2023-04-10 ENCOUNTER — Other Ambulatory Visit: Payer: Self-pay | Admitting: Cardiovascular Disease

## 2023-04-17 ENCOUNTER — Ambulatory Visit: Payer: Medicare Other | Attending: Cardiovascular Disease

## 2023-04-17 ENCOUNTER — Ambulatory Visit (INDEPENDENT_AMBULATORY_CARE_PROVIDER_SITE_OTHER): Payer: Medicare Other

## 2023-04-17 DIAGNOSIS — I480 Paroxysmal atrial fibrillation: Secondary | ICD-10-CM | POA: Diagnosis not present

## 2023-04-17 DIAGNOSIS — Z953 Presence of xenogenic heart valve: Secondary | ICD-10-CM

## 2023-04-17 DIAGNOSIS — I700 Atherosclerosis of aorta: Secondary | ICD-10-CM | POA: Diagnosis not present

## 2023-04-17 DIAGNOSIS — I350 Nonrheumatic aortic (valve) stenosis: Secondary | ICD-10-CM | POA: Diagnosis not present

## 2023-04-17 DIAGNOSIS — I6529 Occlusion and stenosis of unspecified carotid artery: Secondary | ICD-10-CM

## 2023-04-17 LAB — ECHOCARDIOGRAM COMPLETE
AR max vel: 1.76 cm2
AV Area VTI: 1.68 cm2
AV Area mean vel: 1.73 cm2
AV Mean grad: 8.8 mmHg
AV Peak grad: 17.8 mmHg
Ao pk vel: 2.11 m/s
Area-P 1/2: 2.66 cm2
Calc EF: 58.3 %
S' Lateral: 3.2 cm
Single Plane A2C EF: 54 %
Single Plane A4C EF: 63.2 %

## 2023-04-18 ENCOUNTER — Encounter: Payer: Self-pay | Admitting: Cardiovascular Disease

## 2023-04-21 ENCOUNTER — Other Ambulatory Visit: Payer: Self-pay | Admitting: Cardiovascular Disease

## 2023-05-24 ENCOUNTER — Other Ambulatory Visit: Payer: Self-pay | Admitting: Family

## 2023-05-24 DIAGNOSIS — F32A Depression, unspecified: Secondary | ICD-10-CM

## 2023-06-29 ENCOUNTER — Telehealth: Payer: Medicare Other | Admitting: Family

## 2023-06-29 ENCOUNTER — Telehealth (INDEPENDENT_AMBULATORY_CARE_PROVIDER_SITE_OTHER): Payer: Medicare Other | Admitting: Family

## 2023-06-29 ENCOUNTER — Encounter: Payer: Self-pay | Admitting: Family

## 2023-06-29 VITALS — Ht 63.0 in | Wt 143.3 lb

## 2023-06-29 DIAGNOSIS — Z780 Asymptomatic menopausal state: Secondary | ICD-10-CM

## 2023-06-29 DIAGNOSIS — F32A Depression, unspecified: Secondary | ICD-10-CM | POA: Diagnosis not present

## 2023-06-29 DIAGNOSIS — Z1231 Encounter for screening mammogram for malignant neoplasm of breast: Secondary | ICD-10-CM | POA: Diagnosis not present

## 2023-06-29 DIAGNOSIS — F419 Anxiety disorder, unspecified: Secondary | ICD-10-CM | POA: Diagnosis not present

## 2023-06-29 DIAGNOSIS — I10 Essential (primary) hypertension: Secondary | ICD-10-CM | POA: Diagnosis not present

## 2023-06-29 MED ORDER — CLONAZEPAM 0.5 MG PO TABS
0.5000 mg | ORAL_TABLET | Freq: Three times a day (TID) | ORAL | 2 refills | Status: DC | PRN
Start: 2023-06-29 — End: 2023-10-23

## 2023-06-29 MED ORDER — ESCITALOPRAM OXALATE 10 MG PO TABS
10.0000 mg | ORAL_TABLET | Freq: Every day | ORAL | 3 refills | Status: DC
Start: 2023-06-29 — End: 2024-01-28

## 2023-06-29 NOTE — Assessment & Plan Note (Signed)
Chronic, stable. Continue Lopressor 50 mg BID, losartan 25 mg QD, Lasix 40 mg daily, potassium chloride 10 meq qd 

## 2023-06-29 NOTE — Progress Notes (Signed)
Virtual Visit via Video Note  I connected with Deborah Koch on 06/29/23 at 11:00 AM EDT by a video enabled telemedicine application and verified that I am speaking with the correct person using two identifiers. Location patient: home Location provider: work  Persons participating in the virtual visit: patient, provider  I discussed the limitations of evaluation and management by telemedicine and the availability of in person appointments. The patient expressed understanding and agreed to proceed.  HPI: Feels well today   No new concerns  She has moved to [place] to be closer to her children.  Depression and anxiety has improved. She is much happier and has a 'new start'.   Due colonoscopy She is concerned with anesthesia  Compliant with Lexapro 10 mg, Klonopin 0.5 mg TID prn.  More often she is using Klonopin 0.5 mg twice daily.    ROS: See pertinent positives and negatives per HPI.  EXAM  VITALS per patient if applicable: Ht 5\' 3"  (1.6 m)   Wt 143 lb 4.8 oz (65 kg)   BMI 25.38 kg/m  BP Readings from Last 3 Encounters:  03/13/23 134/62  02/07/23 128/84  01/23/22 120/68   Wt Readings from Last 3 Encounters:  06/29/23 143 lb 4.8 oz (65 kg)  03/13/23 143 lb 6.4 oz (65 kg)  02/07/23 142 lb 6.4 oz (64.6 kg)    GENERAL: alert, oriented, appears well and in no acute distress  HEENT: atraumatic, conjunttiva clear, no obvious abnormalities on inspection of external nose and ears  NECK: normal movements of the head and neck  LUNGS: on inspection no signs of respiratory distress, breathing rate appears normal, no obvious gross SOB, gasping or wheezing  CV: no obvious cyanosis  MS: moves all visible extremities without noticeable abnormality  PSYCH/NEURO: pleasant and cooperative, no obvious depression or anxiety, speech and thought processing grossly intact  ASSESSMENT AND PLAN: Anxiety and depression Assessment & Plan: Chronic, stable.  Continue Lexapro  10 mg, Klonopin 0.5 mg TID prn.  Discussed gradual reduction of Klonopin.  Currently using more often twice daily versus 3 times a day.  Will monitor  Orders: -     clonazePAM; Take 1 tablet (0.5 mg total) by mouth 3 (three) times daily as needed for anxiety. Schedule appt for refill.  Dispense: 90 tablet; Refill: 2 -     Escitalopram Oxalate; Take 1 tablet (10 mg total) by mouth daily.  Dispense: 90 tablet; Refill: 3  Encounter for screening mammogram for malignant neoplasm of breast -     3D Screening Mammogram, Left and Right; Future  Asymptomatic postmenopausal state -     DG Bone Density; Future  Primary hypertension Assessment & Plan: Chronic, stable. Continue Lopressor 50 mg BID, losartan 25 mg QD, Lasix 40 mg daily, potassium chloride 10 meq qd      -we discussed possible serious and likely etiologies, options for evaluation and workup, limitations of telemedicine visit vs in person visit, treatment, treatment risks and precautions. Pt prefers to treat via telemedicine empirically rather then risking or undertaking an in person visit at this moment.    I discussed the assessment and treatment plan with the patient. The patient was provided an opportunity to ask questions and all were answered. The patient agreed with the plan and demonstrated an understanding of the instructions.   The patient was advised to call back or seek an in-person evaluation if the symptoms worsen or if the condition fails to improve as anticipated.  Advised if desired  AVS can be mailed or viewed via MyChart if Mychart user.   Rennie Plowman, FNP

## 2023-06-29 NOTE — Patient Instructions (Addendum)
Please call [facility] when you are ready for colonoscopy and reach out to Dr Servando Snare regarding anesthesia concerns   Please call  and schedule your 3D mammogram and /or bone density scan as we discussed.   [facility]  ( new location in 2023)  [address], [place], [place] [address]  [place], [place]  [phone]   So happy you are close to your children.

## 2023-06-29 NOTE — Assessment & Plan Note (Addendum)
Chronic, stable.  Continue Lexapro 10 mg, Klonopin 0.5 mg TID prn.  Discussed gradual reduction of Klonopin.  Currently using more often twice daily versus 3 times a day.  Will monitor

## 2023-07-02 ENCOUNTER — Telehealth: Payer: Self-pay | Admitting: Family

## 2023-07-02 NOTE — Telephone Encounter (Signed)
Lvm for pt to call back the office for her 3 months f/u after her Mychart visit on 06/29/23.

## 2023-10-15 ENCOUNTER — Other Ambulatory Visit: Payer: Self-pay | Admitting: Cardiovascular Disease

## 2023-10-19 ENCOUNTER — Other Ambulatory Visit: Payer: Self-pay | Admitting: Family

## 2023-10-19 DIAGNOSIS — F419 Anxiety disorder, unspecified: Secondary | ICD-10-CM

## 2023-10-19 NOTE — Telephone Encounter (Signed)
Spoke to pt and informed her that Deborah Koch was gone for the day and also to make sure she does have enough medication to get her through the weekend . I will make sure Deborah Koch refills this on 10/21/24

## 2023-10-26 NOTE — Telephone Encounter (Signed)
Scheduled pt for 11/07/23 for refills and labs

## 2023-11-07 ENCOUNTER — Ambulatory Visit: Payer: Medicare Other | Admitting: Family

## 2023-11-07 ENCOUNTER — Encounter: Payer: Self-pay | Admitting: Family

## 2023-11-07 VITALS — BP 132/66 | HR 65 | Temp 97.7°F | Ht 63.0 in | Wt 139.2 lb

## 2023-11-07 DIAGNOSIS — F32A Depression, unspecified: Secondary | ICD-10-CM

## 2023-11-07 DIAGNOSIS — F419 Anxiety disorder, unspecified: Secondary | ICD-10-CM

## 2023-11-07 DIAGNOSIS — M810 Age-related osteoporosis without current pathological fracture: Secondary | ICD-10-CM | POA: Diagnosis not present

## 2023-11-07 DIAGNOSIS — I10 Essential (primary) hypertension: Secondary | ICD-10-CM

## 2023-11-07 LAB — COMPREHENSIVE METABOLIC PANEL
ALT: 11 U/L (ref 0–35)
AST: 16 U/L (ref 0–37)
Albumin: 4.7 g/dL (ref 3.5–5.2)
Alkaline Phosphatase: 101 U/L (ref 39–117)
BUN: 16 mg/dL (ref 6–23)
CO2: 31 meq/L (ref 19–32)
Calcium: 10 mg/dL (ref 8.4–10.5)
Chloride: 99 meq/L (ref 96–112)
Creatinine, Ser: 0.77 mg/dL (ref 0.40–1.20)
GFR: 79.25 mL/min (ref >60.00)
Glucose, Bld: 97 mg/dL (ref 70–99)
Potassium: 4.4 meq/L (ref 3.5–5.1)
Sodium: 137 meq/L (ref 135–145)
Total Bilirubin: 0.7 mg/dL (ref 0.2–1.2)
Total Protein: 7.3 g/dL (ref 6.0–8.3)

## 2023-11-07 LAB — VITAMIN D 25 HYDROXY (VIT D DEFICIENCY, FRACTURES): VITD: 61.79 ng/mL (ref 30.00–100.00)

## 2023-11-07 MED ORDER — CLONAZEPAM 0.5 MG PO TABS
0.5000 mg | ORAL_TABLET | Freq: Two times a day (BID) | ORAL | 2 refills | Status: DC | PRN
Start: 2023-11-07 — End: 2024-01-17

## 2023-11-07 NOTE — Progress Notes (Unsigned)
Assessment & Plan:  Primary hypertension Assessment & Plan: Chronic, stable. Continue Lopressor 50 mg BID, losartan 25 mg QD, Lasix 40 mg daily, potassium chloride 10 meq qd  Orders: -     Comprehensive metabolic panel  Age related osteoporosis, unspecified pathological fracture presence -     Comprehensive metabolic panel -     VITAMIN D 25 Hydroxy (Vit-D Deficiency, Fractures)  Anxiety and depression Assessment & Plan: Chronic, stable.  Continue Lexapro 10 mg. Reduced dose of Klonopin 0.5 mg BID prn.    Orders: -     clonazePAM; Take 1 tablet (0.5 mg total) by mouth 2 (two) times daily as needed for anxiety.  Dispense: 60 tablet; Refill: 2     Return precautions given.   Risks, benefits, and alternatives of the medications and treatment plan prescribed today were discussed, and patient expressed understanding.   Education regarding symptom management and diagnosis given to patient on AVS either electronically or printed.  No follow-ups on file.  Rennie Plowman, FNP  Subjective:    Patient ID: Deborah Koch, female    DOB: [birth year], 67 y.o.   MRN: [id number]  CC: Deborah Koch is a 67 y.o. female who presents today for follow up.   HPI: Feels well today.  No new complaints.  She is doing very well in [place].  Enjoys living near her children, grandchildren.  Anxiety is improved.  She is taking less Klonopin and is reduced dose from 3 times daily to twice daily dosing.  Denies chest pain, shortness of breath    Allergies: Hydrocodone Current Outpatient Medications on File Prior to Visit  Medication Sig Dispense Refill   escitalopram (LEXAPRO) 10 MG tablet Take 1 tablet (10 mg total) by mouth daily. 90 tablet 3   furosemide (LASIX) 40 MG tablet TAKE 1 TABLET BY MOUTH DAILY 90 tablet 0   metoprolol tartrate (LOPRESSOR) 50 MG tablet Take 0.5 tablets (25 mg total) by mouth 2 (two) times daily. 30 tablet 9   potassium chloride (KLOR-CON M10) 10 MEQ  tablet TAKE 1 TABLET BY MOUTH EVERY DAY. 90 tablet 3   losartan (COZAAR) 25 MG tablet Take 1 tablet (25 mg total) by mouth daily. 90 tablet 3   Current Facility-Administered Medications on File Prior to Visit  Medication Dose Route Frequency Provider Last Rate Last Admin   sodium chloride flush (NS) 0.9 % injection 3 mL  3 mL Intravenous Q12H Visser, Jacquelyn D, PA-C        Review of Systems  Constitutional:  Negative for chills and fever.  Respiratory:  Negative for cough.   Cardiovascular:  Negative for chest pain and palpitations.  Gastrointestinal:  Negative for nausea and vomiting.      Objective:    BP 132/66   Pulse 65   Temp 97.7 F (36.5 C) (Oral)   Ht 5\' 3"  (1.6 m)   Wt 139 lb 3.2 oz (63.1 kg)   SpO2 99%   BMI 24.66 kg/m  BP Readings from Last 3 Encounters:  11/07/23 132/66  03/13/23 134/62  02/07/23 128/84   Wt Readings from Last 3 Encounters:  11/07/23 139 lb 3.2 oz (63.1 kg)  06/29/23 143 lb 4.8 oz (65 kg)  03/13/23 143 lb 6.4 oz (65 kg)    Physical Exam Vitals reviewed.  Constitutional:      Appearance: She is well-developed.  Eyes:     Conjunctiva/sclera: Conjunctivae normal.  Cardiovascular:     Rate and Rhythm: Normal rate and regular  rhythm.     Pulses: Normal pulses.     Heart sounds: Murmur heard.     Systolic murmur is present with a grade of 2/6.  Pulmonary:     Effort: Pulmonary effort is normal.     Breath sounds: Normal breath sounds. No wheezing, rhonchi or rales.  Skin:    General: Skin is warm and dry.  Neurological:     Mental Status: She is alert.  Psychiatric:        Speech: Speech normal.        Behavior: Behavior normal.        Thought Content: Thought content normal.

## 2023-11-07 NOTE — Patient Instructions (Signed)
Please call  and schedule your 3D mammogram scan as we discussed.   [facility]  ( new location in 2023)  [address], [place], [place] [address]  [place], [place]  [phone]

## 2023-11-08 NOTE — Assessment & Plan Note (Signed)
Chronic, stable. Continue Lopressor 50 mg BID, losartan 25 mg QD, Lasix 40 mg daily, potassium chloride 10 meq qd 

## 2023-11-08 NOTE — Assessment & Plan Note (Signed)
Chronic, stable.  Continue Lexapro 10 mg. Reduced dose of Klonopin 0.5 mg BID prn.

## 2023-12-09 ENCOUNTER — Encounter: Payer: Self-pay | Admitting: Family

## 2023-12-11 ENCOUNTER — Other Ambulatory Visit: Payer: Self-pay

## 2023-12-11 ENCOUNTER — Telehealth: Payer: Self-pay

## 2023-12-11 MED ORDER — METOPROLOL TARTRATE 25 MG PO TABS: 12.5000 mg | ORAL_TABLET | Freq: Two times a day (BID) | ORAL | 1 refills | Status: AC

## 2023-12-11 NOTE — Telephone Encounter (Signed)
Medication sent in left a message for pt to call back and schedule a Bp check in 1-2 weeks

## 2023-12-11 NOTE — Telephone Encounter (Signed)
Copied from CRM [id number]. Topic: Appointments - Scheduling Inquiry for Clinic >> Dec 11, 2023 11:04 AM Fuller Mandril wrote: Reason for CRM: Pt returning missed call from call. Advised pt that Rx had been sent to pharmacy and that she would need to schedule BP check in 1-2 weeks. Was going to schedule for pt but she states she lives in [place] and wanted to know if there was somewhere closer she could go for Bp check and have the results sent in to the provider. Thank You.

## 2023-12-14 NOTE — Telephone Encounter (Signed)
FYI

## 2023-12-14 NOTE — Telephone Encounter (Signed)
LMTCB

## 2023-12-14 NOTE — Telephone Encounter (Signed)
I called the patient she will be seen on 12/24/23. She stated she does not have a BP cuff to take her blood pressure and she is in [place] at this time.

## 2023-12-21 ENCOUNTER — Encounter: Payer: Self-pay | Admitting: Family

## 2023-12-21 NOTE — Telephone Encounter (Signed)
 Noted.

## 2023-12-24 ENCOUNTER — Ambulatory Visit: Payer: Medicare Other | Admitting: Family

## 2023-12-31 ENCOUNTER — Ambulatory Visit: Payer: Medicare Other | Admitting: Family

## 2024-01-14 ENCOUNTER — Other Ambulatory Visit: Payer: Self-pay | Admitting: Family

## 2024-01-14 ENCOUNTER — Other Ambulatory Visit: Payer: Self-pay | Admitting: Cardiovascular Disease

## 2024-01-14 DIAGNOSIS — F32A Depression, unspecified: Secondary | ICD-10-CM

## 2024-01-15 ENCOUNTER — Telehealth: Payer: Self-pay | Admitting: Cardiovascular Disease

## 2024-01-15 DIAGNOSIS — Z952 Presence of prosthetic heart valve: Secondary | ICD-10-CM | POA: Diagnosis not present

## 2024-01-15 DIAGNOSIS — I10 Essential (primary) hypertension: Secondary | ICD-10-CM | POA: Diagnosis not present

## 2024-01-15 NOTE — Telephone Encounter (Signed)
Left voice mail

## 2024-01-15 NOTE — Telephone Encounter (Signed)
Pt needs appt scheduled from recall

## 2024-01-15 NOTE — Telephone Encounter (Signed)
Please contact pt for future appointment. Pt due for follow up.

## 2024-01-16 ENCOUNTER — Encounter: Payer: Self-pay | Admitting: Family

## 2024-01-16 NOTE — Telephone Encounter (Signed)
Pt has seen message and has responded to message

## 2024-01-16 NOTE — Telephone Encounter (Signed)
Spoke to pt she just saw new PA on yesterday, the PA told her that she would need to get you to refill the Klonopin for now, but she can fill the rest of her rx's pt stated that her BP was 130/72 on yesterday at her visit, pt stated that she tried to send you a my chart message but was unable to.

## 2024-01-17 ENCOUNTER — Other Ambulatory Visit: Payer: Self-pay | Admitting: Family

## 2024-01-17 DIAGNOSIS — F419 Anxiety disorder, unspecified: Secondary | ICD-10-CM

## 2024-01-17 MED ORDER — CLONAZEPAM 0.5 MG PO TABS: 0.5000 mg | ORAL_TABLET | Freq: Two times a day (BID) | ORAL | 0 refills | Status: DC | PRN

## 2024-01-21 NOTE — Telephone Encounter (Signed)
 Pt scheduled on 4/11

## 2024-01-25 ENCOUNTER — Other Ambulatory Visit: Payer: Self-pay | Admitting: Family

## 2024-01-25 DIAGNOSIS — F419 Anxiety disorder, unspecified: Secondary | ICD-10-CM

## 2024-01-28 ENCOUNTER — Other Ambulatory Visit: Payer: Self-pay | Admitting: Family

## 2024-01-28 DIAGNOSIS — F419 Anxiety disorder, unspecified: Secondary | ICD-10-CM

## 2024-02-26 DIAGNOSIS — H527 Unspecified disorder of refraction: Secondary | ICD-10-CM | POA: Diagnosis not present

## 2024-03-03 ENCOUNTER — Telehealth: Payer: Self-pay | Admitting: Cardiovascular Disease

## 2024-03-03 MED ORDER — AMOXICILLIN 500 MG PO CAPS: 2000.0000 mg | ORAL_CAPSULE | Freq: Once | ORAL | 0 refills | Status: AC

## 2024-03-03 NOTE — Telephone Encounter (Signed)
 Per Dr. Mariah Milling, no antibiotics are required for the exam or X-ray, as long as the exam does not involve scraping. The patient stated she will inform the dental office not to use any scraping instruments but would like an updated prescription if she needs to return for a deep cleaning. A new prescription was sent to the requested pharmacy.

## 2024-03-03 NOTE — Telephone Encounter (Signed)
 Patient states she is scheduled for a dental exam + xrays tomorrow morning and she would like to know if she needs an antibiotic. She says she isn't having any work done, aside from consult and xrays. She says she knows it's needed for cleanings, but wasn't sure if it was needed for the consult.

## 2024-03-07 ENCOUNTER — Telehealth: Payer: Self-pay | Admitting: Cardiovascular Disease

## 2024-03-07 NOTE — Telephone Encounter (Signed)
   Name: Deborah Koch  DOB: 02-21-1956  MRN: [id number]  Primary Cardiologist: Julien Nordmann, MD  Chart reviewed as part of pre-operative protocol coverage. The patient has an upcoming visit scheduled with Dr. Mariah Milling on 03/28/2024 at which time clearance can be addressed in case there are any issues that would impact surgical recommendations.  Deep periodontal scaling is not scheduled until TBD as below. I added preop FYI to appointment note so that provider is aware to address at time of outpatient visit.  Per office protocol the cardiology provider should forward their finalized clearance decision and recommendations regarding antiplatelet therapy to the requesting party below.  I will route this message as FYI to requesting party and remove this message from the preop box as separate preop APP input not needed at this time.   Please call with any questions.  Joylene Grapes, NP  03/07/2024, 12:49 PM

## 2024-03-07 NOTE — Telephone Encounter (Signed)
   Pre-operative Risk Assessment    Patient Name: Deborah Koch  DOB: [birth year] MRN: [id number]   Date of last office visit: 03/13/2023 Date of next office visit: 03/28/2024  Request for Surgical Clearance    Procedure:   Deep Periodontal Scaling  Date of Surgery:  Clearance TBD                                Surgeon:  Dr. Bea Laura. [place] [facility] Group or Practice Name:  [facility] Phone number:  [phone] Fax number:  [phone]   Type of Clearance Requested:   - Medical    Type of Anesthesia:  Not Indicated   Additional requests/questions:    SignedMaceo Pro Schools   03/07/2024, 12:07 PM

## 2024-03-28 ENCOUNTER — Ambulatory Visit: Payer: Medicare Other | Admitting: Cardiovascular Disease

## 2024-04-01 ENCOUNTER — Other Ambulatory Visit: Payer: Self-pay | Admitting: Family

## 2024-04-01 DIAGNOSIS — F32A Depression, unspecified: Secondary | ICD-10-CM

## 2024-04-01 DIAGNOSIS — L2089 Other atopic dermatitis: Secondary | ICD-10-CM | POA: Diagnosis not present

## 2024-04-01 DIAGNOSIS — D485 Neoplasm of uncertain behavior of skin: Secondary | ICD-10-CM | POA: Diagnosis not present

## 2024-04-01 DIAGNOSIS — C44722 Squamous cell carcinoma of skin of right lower limb, including hip: Secondary | ICD-10-CM | POA: Diagnosis not present

## 2024-04-15 ENCOUNTER — Other Ambulatory Visit: Payer: Self-pay | Admitting: Cardiovascular Disease

## 2024-05-05 DIAGNOSIS — C44722 Squamous cell carcinoma of skin of right lower limb, including hip: Secondary | ICD-10-CM | POA: Diagnosis not present

## 2024-05-06 ENCOUNTER — Other Ambulatory Visit: Payer: Self-pay | Admitting: Family

## 2024-05-06 DIAGNOSIS — F32A Depression, unspecified: Secondary | ICD-10-CM

## 2024-05-22 DIAGNOSIS — Z952 Presence of prosthetic heart valve: Secondary | ICD-10-CM | POA: Diagnosis not present

## 2024-05-22 DIAGNOSIS — Z13220 Encounter for screening for lipoid disorders: Secondary | ICD-10-CM | POA: Diagnosis not present

## 2024-05-22 DIAGNOSIS — I10 Essential (primary) hypertension: Secondary | ICD-10-CM | POA: Diagnosis not present

## 2024-05-29 ENCOUNTER — Other Ambulatory Visit: Payer: Self-pay | Admitting: Family

## 2024-06-02 DIAGNOSIS — C44311 Basal cell carcinoma of skin of nose: Secondary | ICD-10-CM | POA: Diagnosis not present

## 2024-06-02 DIAGNOSIS — L820 Inflamed seborrheic keratosis: Secondary | ICD-10-CM | POA: Diagnosis not present

## 2024-06-02 DIAGNOSIS — L2089 Other atopic dermatitis: Secondary | ICD-10-CM | POA: Diagnosis not present

## 2024-06-02 DIAGNOSIS — D1801 Hemangioma of skin and subcutaneous tissue: Secondary | ICD-10-CM | POA: Diagnosis not present

## 2024-06-02 DIAGNOSIS — L538 Other specified erythematous conditions: Secondary | ICD-10-CM | POA: Diagnosis not present

## 2024-06-02 DIAGNOSIS — L281 Prurigo nodularis: Secondary | ICD-10-CM | POA: Diagnosis not present

## 2024-06-02 DIAGNOSIS — L814 Other melanin hyperpigmentation: Secondary | ICD-10-CM | POA: Diagnosis not present

## 2024-06-02 DIAGNOSIS — Z789 Other specified health status: Secondary | ICD-10-CM | POA: Diagnosis not present

## 2024-06-02 DIAGNOSIS — Z7189 Other specified counseling: Secondary | ICD-10-CM | POA: Diagnosis not present

## 2024-06-02 DIAGNOSIS — L821 Other seborrheic keratosis: Secondary | ICD-10-CM | POA: Diagnosis not present

## 2024-06-02 DIAGNOSIS — Z08 Encounter for follow-up examination after completed treatment for malignant neoplasm: Secondary | ICD-10-CM | POA: Diagnosis not present

## 2024-06-02 DIAGNOSIS — D485 Neoplasm of uncertain behavior of skin: Secondary | ICD-10-CM | POA: Diagnosis not present

## 2024-06-02 DIAGNOSIS — L2989 Other pruritus: Secondary | ICD-10-CM | POA: Diagnosis not present

## 2024-06-02 DIAGNOSIS — C44722 Squamous cell carcinoma of skin of right lower limb, including hip: Secondary | ICD-10-CM | POA: Diagnosis not present

## 2024-06-09 ENCOUNTER — Ambulatory Visit: Admitting: Cardiovascular Disease

## 2024-06-10 ENCOUNTER — Other Ambulatory Visit: Payer: Self-pay | Admitting: Family

## 2024-06-10 ENCOUNTER — Other Ambulatory Visit: Payer: Self-pay | Admitting: Cardiovascular Disease

## 2024-06-10 DIAGNOSIS — F32A Depression, unspecified: Secondary | ICD-10-CM

## 2024-06-12 NOTE — Telephone Encounter (Signed)
 LVM and sent my chart message to pt. Please relay and document  Call pt  30 day supply of medication She is overdue for refill No refills until she makes an appt

## 2024-06-12 NOTE — Telephone Encounter (Signed)
 Copied from CRM [id number]. Topic: General - Other >> Jun 12, 2024  9:24 AM Rosina BIRCH wrote: Reason for CRM: patient returned the call stating she has moved and the [facility] in [place] sent the refill by accident so she stated to disregard the message. Patient stated she has a new doctor in [place]

## 2024-06-12 NOTE — Telephone Encounter (Signed)
 Noted

## 2024-06-12 NOTE — Telephone Encounter (Signed)
 Rx has been cancelled.

## 2024-06-26 DIAGNOSIS — C44722 Squamous cell carcinoma of skin of right lower limb, including hip: Secondary | ICD-10-CM | POA: Diagnosis not present

## 2024-07-03 DIAGNOSIS — C44311 Basal cell carcinoma of skin of nose: Secondary | ICD-10-CM | POA: Diagnosis not present

## 2024-08-15 ENCOUNTER — Encounter: Payer: Self-pay | Admitting: Cardiovascular Disease

## 2024-08-29 NOTE — Progress Notes (Signed)
 Date:  09/01/2024   ID:  Deborah Koch Number, DOB 1956/03/14, MRN [id number]  Patient Location:  [address] [place] [address]   Provider location:   [facility], [place] office  PCP:  Deborah Delon BRAVO, PA-C  Cardiologist:  [facility]  Chief Complaint  Patient presents with   12 month follow up     Doing well.     History of Present Illness:    Deborah Koch is a 68 y.o. female  past medical history of bicuspid aortic valve,valve stenosis  F/u CTA of the chest showed a 4.7 cm fusiform ascending aortic aneurysm.  She was subsequently referred to thoracic surgery and on Apr 21 2021, she underwent replacement of the ascending aorta and aortic arch with aorto-innominate and aorto-left carotid bypass with supra coronary anastomosis as well as aortic valve replacement. 23 mm Edwards bovine valve present in the aortic position.  PTSD from surgery hypertension,  GERD,  out on disability for scoliosis among other issues prior smoking history Anxiety who presents for routine follow-up of her aortic valve stenosis, dilating ascending aorta s/p aortic surgery and prosthetic valve  Last seen by myself in clinic 3/24 In follow-up today reports getting over 3 skin cancers had surgeries  Takes lasix , losartan  , metoprolol  No SOB Lives on second floor Denies significant leg swelling, no PND orthopnea  Denies palpitations concerning for arrhythmia  Last echo 4/24 Normal left and right ventricular size and function Stable prosthetic aortic valve Moderate to severe tricuspid valve regurgitation with mildly elevated right heart pressures  History of back discomfort from scoliosis  EKG personally reviewed by myself on todays visit EKG Interpretation Date/Time:  Monday September 01 2024 11:52:51 EDT Ventricular Rate:  59 PR Interval:  176 QRS Duration:  70 QT Interval:  416 QTC Calculation: 411 R Axis:   -7  Text  Interpretation: Sinus bradycardia Possible Left atrial enlargement When compared with ECG of 01-Sep-2024 11:52, No significant change was found Confirmed by Deborah Koch [phone]) on 09/01/2024 12:25:52 PM    Other past medical history reviewed Prior CTA of the chest showed a 4.7 cm fusiform ascending aortic aneurysm.  She was subsequently referred to thoracic surgery and on May 5, she underwent replacement of the ascending aorta and aortic arch with aorto-innominate and aorto-left carotid bypass with supra coronary anastomosis as well as aortic valve replacement.  hospitalizations  status post replacement of the ascending aorta and aortic arch with aorto-innominate and aorto-left carotid bypass with supra coronary anastomosis   aortic valve replacement  rehospitalization with pleural effusions, pericardial effusion, and paroxysmal atrial fibrillation. moderate pericardial effusion and right greater than left pleural effusion.  Echocardiogram July 2022 1. Left ventricular ejection fraction, by estimation, is 55 to 60%. The  left ventricle has normal function.   2. There is no evidence of pericardial effusion.   3. The mitral valve is normal in structure. Mild mitral valve  regurgitation.   4. Tricuspid valve regurgitation is moderate.   5. The aortic valve has been repaired/replaced. There is a 23 mm Edwards  bovine valve present in the aortic position. Procedure Date: 04/2021.     Past Medical History:  Diagnosis Date   Anxiety    Ascending Aortic Aneurysm    a. 04/2021 s/p replacement of the ascending aorta and aortic arch with aorta-innominate and aorta-left carotid bypass with supra coronary anastomosis as well as AVR with 23 mm Edwards pericardial tissue valve.  C. difficile colitis    a. 07/2014.   Congenital bicuspid aortic valve    Depression    Diverticulitis    hospitalized; subsequent c diff infection.    GERD (gastroesophageal reflux disease)    H/O mastitis 2015    Heart murmur    History of cardiac catheterization    a. 03/2021 Nl cors.   History of tobacco abuse    a. 20 yrs, 1/4 ppd - quit.   HTN (hypertension)    Hypokalemia    Multilevel degenerative disc disease    PAF (paroxysmal atrial fibrillation) (HCC)    a. 04/2021-->amio. No OAC 2/2 concern for hemopericardium (post-op Ao arch/AVR).   Pleural effusion, bilateral    a. 04/2021 s/p thoracic surgery.   Pneumonia    at age 54   Scoliosis    Severe aortic stenosis w/ congenital bicuspid AoV    a. 04/2021 s/p 23mm edwards pericardial tissue valve; b. 05/12/2021 Echo: EF 55-60%, no rwma, Gr2 DD, nl RV fxn, mild BAE, moderate pericardial effusion, mild MR, mild to mod TR, Nl fxn'ing AoV prosthesis.   Past Surgical History:  Procedure Laterality Date   AORTA -INNOMIATE BYPASS N/A 04/21/2021   Procedure: AORTA -NEIL BYPASS;  Surgeon: Deborah Dorise POUR, MD;  Location: [facility] OR;  Service: Vascular;  Laterality: N/A;   AORTIC VALVE REPLACEMENT N/A 04/21/2021   Procedure: AORTIC VALVE REPLACEMENT (AVR) ON PUMP WITH INSPIRIS AORTIC VALVE;  Surgeon: Deborah Dorise POUR, MD;  Location: [facility] OR;  Service: Open Heart Surgery;  Laterality: N/A;   BARTHOLIN GLAND CYST EXCISION     BREAST BIOPSY Left    neg   CAROTID-SUBCLAVIAN BYPASS GRAFT Left 04/21/2021   Procedure: AORTA LEFT COMMON CAROTID BYPASS;  Surgeon: Deborah Dorise POUR, MD;  Location: [facility] OR;  Service: Vascular;  Laterality: Left;   COLONOSCOPY  2013   Dr. Jinny   COLONOSCOPY WITH PROPOFOL  N/A 10/23/2017   Procedure: COLONOSCOPY WITH PROPOFOL ;  Surgeon: Deborah Koch Carmine, MD;  Location: [facility] ENDOSCOPY;  Service: Endoscopy;  Laterality: N/A;   OOPHORECTOMY  2016   OVARY SURGERY  2016   cyst on right ovary   REPLACEMENT ASCENDING AORTA N/A 04/21/2021   Procedure: REPLACEMENT ASCENDING & ARCH AORTA WITH HEMASHIELD PLATINUM GRAFT 28 [id number];  Surgeon: Deborah Dorise POUR, MD;  Location: [facility] OR;  Service: Open Heart Surgery;  Laterality: N/A;  CIRC ARREST  RIGHT  AXILLARY ARTERY CANNULATION   RIGHT/LEFT HEART CATH AND CORONARY ANGIOGRAPHY N/A 03/24/2021   Procedure: RIGHT/LEFT HEART CATH AND CORONARY ANGIOGRAPHY;  Surgeon: Deborah Evalene PARAS, MD;  Location: [facility] INVASIVE CV LAB;  Service: Cardiovascular;  Laterality: N/A;   SALPINGECTOMY Bilateral    TEE WITHOUT CARDIOVERSION N/A 04/21/2021   Procedure: TRANSESOPHAGEAL ECHOCARDIOGRAM (TEE);  Surgeon: Deborah Dorise POUR, MD;  Location: [facility] OR;  Service: Open Heart Surgery;  Laterality: N/A;      Allergies:   Hydrocodone   Social History   Tobacco Use   Smoking status: Former    Current packs/day: 0.00    Types: Cigarettes    Quit date: 04/20/2021    Years since quitting: 3.3   Smokeless tobacco: Never  Vaping Use   Vaping status: Never Used  Substance Use Topics   Alcohol use: Yes    Alcohol/week: 3.0 standard drinks of alcohol    Types: 3 Glasses of wine per week    Comment:  2-3 glasses of wine every other evening   Drug use: No     Current Outpatient Medications  on File Prior to Visit  Medication Sig Dispense Refill   clobetasol cream (TEMOVATE) 0.05 % Apply 1 Application topically 2 (two) times daily.     clonazePAM  (KLONOPIN ) 0.5 MG tablet Take 1 tablet (0.5 mg total) by mouth 2 (two) times daily as needed (no further refills until appt). for anxiety 60 tablet 0   escitalopram  (LEXAPRO ) 10 MG tablet TAKE 1 TABLET BY MOUTH DAILY 90 tablet 2   furosemide  (LASIX ) 40 MG tablet TAKE 1 TABLET BY MOUTH DAILY 90 tablet 1   losartan  (COZAAR ) 25 MG tablet Take 1 tablet (25 mg total) by mouth daily. No further refills until appt 30 tablet 0   metoprolol  tartrate (LOPRESSOR ) 25 MG tablet Take 0.5 tablets (12.5 mg total) by mouth 2 (two) times daily. 90 tablet 1   potassium chloride  (KLOR-CON  M) 10 MEQ tablet TAKE 1 TABLET BY MOUTH DAILY 30 tablet 0   Current Facility-Administered Medications on File Prior to Visit  Medication Dose Route Frequency Provider Last Rate Last Admin   sodium chloride  flush  (NS) 0.9 % injection 3 mL  3 mL Intravenous Q12H Visser, Jacquelyn D, PA-C         Family Hx: The patient's family history includes Alcohol abuse in her brother; Anxiety disorder in her mother and sister; Breast cancer in her maternal aunt; Colon cancer in her mother; Depression in her mother and sister; Heart failure in her father; Hypertension in her father and mother; Schizophrenia in her brother. There is no history of Heart attack or Stroke.  ROS:   Please see the history of present illness.    Review of Systems  Constitutional: Negative.   HENT: Negative.    Respiratory: Negative.    Cardiovascular: Negative.   Gastrointestinal: Negative.   Musculoskeletal: Negative.   Neurological: Negative.   Psychiatric/Behavioral: Negative.    All other systems reviewed and are negative.    Labs/Other Tests and Data Reviewed:    Recent Labs: 11/07/2023: ALT 11; BUN 16; Creatinine, Ser 0.77; Potassium 4.4; Sodium 137   Recent Lipid Panel Lab Results  Component Value Date/Time   CHOL 194 02/07/2023 10:13 AM   TRIG 122.0 02/07/2023 10:13 AM   HDL 73.90 02/07/2023 10:13 AM   CHOLHDL 3 02/07/2023 10:13 AM   LDLCALC 95 02/07/2023 10:13 AM    Wt Readings from Last 3 Encounters:  09/01/24 146 lb (66.2 kg)  11/07/23 139 lb 3.2 oz (63.1 kg)  06/29/23 143 lb 4.8 oz (65 kg)     Exam:    BP (!) 140/70 (BP Location: Left Arm, Patient Position: Sitting, Cuff Size: Normal)   Pulse (!) 59   Ht 5' 3 (1.6 m)   Wt 146 lb (66.2 kg)   SpO2 97%   BMI 25.86 kg/m   Constitutional:  oriented to person, place, and time. No distress.  HENT:  Head: Grossly normal Eyes:  no discharge. No scleral icterus.  Neck: No JVD, no carotid bruits  Cardiovascular: Regular rate and rhythm, 2/6 SEM right sternal border Pulmonary/Chest: Clear to auscultation bilaterally, no wheezes or rails Abdominal: Soft.  no distension.  no tenderness.  Musculoskeletal: Normal range of motion Neurological:  normal  muscle tone. Coordination normal. No atrophy Skin: Skin warm and dry Psychiatric: normal affect, pleasant   ASSESSMENT & PLAN:    Problem List Items Addressed This Visit       Cardiology Problems   HTN (hypertension)   Atherosclerosis of aorta (HCC)   Relevant Orders   EKG 12-Lead (  Completed)   Ascending aorta dilation (HCC)   Congenital bicuspid aortic valve   Relevant Orders   EKG 12-Lead (Completed)     Other   S/P ascending aortic aneurysm repair   Relevant Orders   EKG 12-Lead (Completed)   Other Visit Diagnoses       PAF (paroxysmal atrial fibrillation) (HCC)    -  Primary   Relevant Orders   EKG 12-Lead (Completed)     Severe aortic stenosis       Relevant Orders   EKG 12-Lead (Completed)     Essential hypertension       Relevant Orders   EKG 12-Lead (Completed)     Pericardial effusion         S/P aortic valve replacement with bioprosthetic valve          Aortic stenosis S/p surgery, Ascending aorta graft placement with bioprosthetic aortic valve Echocardiogram ordered for April 2026  Dilated ascending aorta Underwent surgery 2022,  Echocardiogram ordered  Hypertension Blood pressure is well controlled on today's visit. No changes made to the medications.  Depression Doing better, PTSD better, less nightmares Has made good recovery from surgery May 2022 On Lexapro  and Klonopin  as needed  Chronic diastolic CHF Moderate to severe tricuspid valve regurgitation Euvolemic, continue Lasix  with potassium Stable renal function and potassium level    Signed, Evalene Lunger, MD  [facility] [address], [place], [place] [address]

## 2024-09-01 ENCOUNTER — Encounter: Payer: Self-pay | Admitting: Cardiovascular Disease

## 2024-09-01 ENCOUNTER — Ambulatory Visit: Attending: Cardiovascular Disease | Admitting: Cardiovascular Disease

## 2024-09-01 VITALS — BP 140/70 | HR 59 | Ht 63.0 in | Wt 146.0 lb

## 2024-09-01 DIAGNOSIS — R001 Bradycardia, unspecified: Secondary | ICD-10-CM | POA: Diagnosis not present

## 2024-09-01 DIAGNOSIS — Q2381 Bicuspid aortic valve: Secondary | ICD-10-CM | POA: Diagnosis not present

## 2024-09-01 DIAGNOSIS — Z8679 Personal history of other diseases of the circulatory system: Secondary | ICD-10-CM | POA: Diagnosis not present

## 2024-09-01 DIAGNOSIS — I10 Essential (primary) hypertension: Secondary | ICD-10-CM | POA: Diagnosis not present

## 2024-09-01 DIAGNOSIS — I350 Nonrheumatic aortic (valve) stenosis: Secondary | ICD-10-CM | POA: Diagnosis not present

## 2024-09-01 DIAGNOSIS — I700 Atherosclerosis of aorta: Secondary | ICD-10-CM | POA: Diagnosis not present

## 2024-09-01 DIAGNOSIS — I3139 Other pericardial effusion (noninflammatory): Secondary | ICD-10-CM | POA: Diagnosis not present

## 2024-09-01 DIAGNOSIS — Z9889 Other specified postprocedural states: Secondary | ICD-10-CM

## 2024-09-01 DIAGNOSIS — I480 Paroxysmal atrial fibrillation: Secondary | ICD-10-CM | POA: Diagnosis not present

## 2024-09-01 DIAGNOSIS — Z953 Presence of xenogenic heart valve: Secondary | ICD-10-CM | POA: Diagnosis not present

## 2024-09-01 DIAGNOSIS — I77810 Thoracic aortic ectasia: Secondary | ICD-10-CM

## 2024-09-01 NOTE — Patient Instructions (Addendum)
 Medication Instructions:  No changes  If you need a refill on your cardiac medications before your next appointment, please call your pharmacy.   Lab work: No new labs needed  Testing/Procedures:  Echo in 4/26 for AVR  Your physician has requested that you have an echocardiogram. Echocardiography is a painless test that uses sound waves to create images of your heart. It provides your doctor with information about the size and shape of your heart and how well your heart's chambers and valves are working.   You may receive an ultrasound enhancing agent through an IV if needed to better visualize your heart during the echo. This procedure takes approximately one hour.  There are no restrictions for this procedure.  This will take place at [address] [facility]) [address], [place] [address]  Please note: We ask at that you not bring children with you during ultrasound (echo/ vascular) testing. Due to room size and safety concerns, children are not allowed in the ultrasound rooms during exams. Our front office staff cannot provide observation of children in our lobby area while testing is being conducted. An adult accompanying a patient to their appointment will only be allowed in the ultrasound room at the discretion of the ultrasound technician under special circumstances. We apologize for any inconvenience.   Follow-Up: At [facility], you and your health needs are our priority.  As part of our continuing mission to provide you with exceptional heart care, we have created designated Provider Care Teams.  These Care Teams include your primary Cardiologist (physician) and Advanced Practice Providers (APPs -  Physician Assistants and Nurse Practitioners) who all work together to provide you with the care you need, when you need it.  You will need a follow up appointment in 12 months  Providers on your designated Care Team:   Lonni Meager, NP Bernardino Bring, PA-C [place]  [place], [place]  COVID-19 Vaccine Information can be found at: [email or web address] For questions related to vaccine distribution or appointments, please email [email or web address] or call 9374343852.  \

## 2024-10-05 ENCOUNTER — Other Ambulatory Visit: Payer: Self-pay | Admitting: Cardiovascular Disease

## 2025-01-12 ENCOUNTER — Ambulatory Visit

## 2025-01-20 ENCOUNTER — Ambulatory Visit

## 2025-01-30 ENCOUNTER — Ambulatory Visit
